# Patient Record
Sex: Female | Born: 1940
Health system: Southern US, Community
[De-identification: ages and names within clinical notes are randomized; demographics above are authoritative.]

## PROBLEM LIST (undated history)

## (undated) DIAGNOSIS — Z9289 Personal history of other medical treatment: Secondary | ICD-10-CM

## (undated) DIAGNOSIS — G4730 Sleep apnea, unspecified: Secondary | ICD-10-CM

## (undated) DIAGNOSIS — R519 Headache, unspecified: Secondary | ICD-10-CM

## (undated) DIAGNOSIS — K219 Gastro-esophageal reflux disease without esophagitis: Secondary | ICD-10-CM

## (undated) DIAGNOSIS — R0600 Dyspnea, unspecified: Secondary | ICD-10-CM

## (undated) DIAGNOSIS — I10 Essential (primary) hypertension: Secondary | ICD-10-CM

## (undated) DIAGNOSIS — G9332 Myalgic encephalomyelitis/chronic fatigue syndrome: Secondary | ICD-10-CM

## (undated) DIAGNOSIS — J189 Pneumonia, unspecified organism: Secondary | ICD-10-CM

## (undated) DIAGNOSIS — K5790 Diverticulosis of intestine, part unspecified, without perforation or abscess without bleeding: Secondary | ICD-10-CM

## (undated) DIAGNOSIS — B029 Zoster without complications: Secondary | ICD-10-CM

## (undated) DIAGNOSIS — J45909 Unspecified asthma, uncomplicated: Secondary | ICD-10-CM

## (undated) DIAGNOSIS — M1990 Unspecified osteoarthritis, unspecified site: Secondary | ICD-10-CM

## (undated) DIAGNOSIS — S82409A Unspecified fracture of shaft of unspecified fibula, initial encounter for closed fracture: Secondary | ICD-10-CM

## (undated) DIAGNOSIS — J449 Chronic obstructive pulmonary disease, unspecified: Secondary | ICD-10-CM

## (undated) DIAGNOSIS — R5382 Chronic fatigue, unspecified: Secondary | ICD-10-CM

## (undated) DIAGNOSIS — T7840XA Allergy, unspecified, initial encounter: Secondary | ICD-10-CM

## (undated) DIAGNOSIS — G709 Myoneural disorder, unspecified: Secondary | ICD-10-CM

## (undated) DIAGNOSIS — D649 Anemia, unspecified: Secondary | ICD-10-CM

## (undated) DIAGNOSIS — I890 Lymphedema, not elsewhere classified: Secondary | ICD-10-CM

## (undated) DIAGNOSIS — M109 Gout, unspecified: Secondary | ICD-10-CM

## (undated) HISTORY — DX: Myoneural disorder, unspecified: G70.9

## (undated) HISTORY — DX: Gastro-esophageal reflux disease without esophagitis: K21.9

## (undated) HISTORY — DX: Unspecified osteoarthritis, unspecified site: M19.90

## (undated) HISTORY — DX: Unspecified fracture of shaft of unspecified fibula, initial encounter for closed fracture: S82.409A

## (undated) HISTORY — DX: Allergy, unspecified, initial encounter: T78.40XA

## (undated) HISTORY — DX: Unspecified asthma, uncomplicated: J45.909

## (undated) HISTORY — DX: Essential (primary) hypertension: I10

## (undated) HISTORY — DX: Chronic fatigue, unspecified: R53.82

## (undated) HISTORY — DX: Myalgic encephalomyelitis/chronic fatigue syndrome: G93.32

## (undated) HISTORY — PX: BIOPSY THYROID: PRO38

## (undated) HISTORY — DX: Lymphedema, not elsewhere classified: I89.0

---

## 1972-05-20 HISTORY — PX: ABDOMINAL HYSTERECTOMY: SHX81

## 2002-05-20 DIAGNOSIS — I890 Lymphedema, not elsewhere classified: Secondary | ICD-10-CM

## 2002-05-20 HISTORY — DX: Lymphedema, not elsewhere classified: I89.0

## 2003-05-21 HISTORY — PX: CHOLECYSTECTOMY: SHX55

## 2004-07-14 ENCOUNTER — Emergency Department: Payer: Self-pay | Admitting: General Practice

## 2004-07-16 ENCOUNTER — Ambulatory Visit: Payer: Self-pay | Admitting: Unknown Physician Specialty

## 2004-07-16 ENCOUNTER — Other Ambulatory Visit: Payer: Self-pay

## 2004-07-16 ENCOUNTER — Inpatient Hospital Stay: Payer: Self-pay | Admitting: Surgery

## 2004-09-24 ENCOUNTER — Ambulatory Visit: Payer: Self-pay | Admitting: Gastroenterology

## 2006-05-21 ENCOUNTER — Ambulatory Visit: Payer: Self-pay | Admitting: Family Medicine

## 2008-03-09 ENCOUNTER — Ambulatory Visit: Payer: Self-pay | Admitting: Vascular Surgery

## 2008-11-17 ENCOUNTER — Ambulatory Visit: Payer: Self-pay | Admitting: Family Medicine

## 2011-12-03 ENCOUNTER — Ambulatory Visit: Payer: Self-pay

## 2012-01-15 DIAGNOSIS — L989 Disorder of the skin and subcutaneous tissue, unspecified: Secondary | ICD-10-CM | POA: Diagnosis not present

## 2012-01-15 DIAGNOSIS — I10 Essential (primary) hypertension: Secondary | ICD-10-CM | POA: Diagnosis not present

## 2012-01-15 DIAGNOSIS — J069 Acute upper respiratory infection, unspecified: Secondary | ICD-10-CM | POA: Diagnosis not present

## 2012-01-21 DIAGNOSIS — L989 Disorder of the skin and subcutaneous tissue, unspecified: Secondary | ICD-10-CM | POA: Diagnosis not present

## 2012-03-11 DIAGNOSIS — K140 Glossitis: Secondary | ICD-10-CM | POA: Diagnosis not present

## 2012-03-26 DIAGNOSIS — I890 Lymphedema, not elsewhere classified: Secondary | ICD-10-CM | POA: Diagnosis not present

## 2012-03-26 DIAGNOSIS — J029 Acute pharyngitis, unspecified: Secondary | ICD-10-CM | POA: Diagnosis not present

## 2012-03-26 DIAGNOSIS — J069 Acute upper respiratory infection, unspecified: Secondary | ICD-10-CM | POA: Diagnosis not present

## 2012-10-16 DIAGNOSIS — N898 Other specified noninflammatory disorders of vagina: Secondary | ICD-10-CM | POA: Diagnosis not present

## 2012-11-17 DIAGNOSIS — Z0000 Encounter for general adult medical examination without abnormal findings: Secondary | ICD-10-CM | POA: Diagnosis not present

## 2013-01-20 DIAGNOSIS — J45901 Unspecified asthma with (acute) exacerbation: Secondary | ICD-10-CM | POA: Diagnosis not present

## 2013-01-20 DIAGNOSIS — J019 Acute sinusitis, unspecified: Secondary | ICD-10-CM | POA: Diagnosis not present

## 2013-01-20 DIAGNOSIS — J209 Acute bronchitis, unspecified: Secondary | ICD-10-CM | POA: Diagnosis not present

## 2013-03-03 DIAGNOSIS — J45909 Unspecified asthma, uncomplicated: Secondary | ICD-10-CM | POA: Diagnosis not present

## 2013-03-03 DIAGNOSIS — M1990 Unspecified osteoarthritis, unspecified site: Secondary | ICD-10-CM | POA: Diagnosis not present

## 2013-03-03 DIAGNOSIS — I10 Essential (primary) hypertension: Secondary | ICD-10-CM | POA: Diagnosis not present

## 2013-03-03 DIAGNOSIS — E669 Obesity, unspecified: Secondary | ICD-10-CM | POA: Diagnosis not present

## 2013-05-19 ENCOUNTER — Ambulatory Visit: Payer: Self-pay

## 2013-08-31 DIAGNOSIS — I10 Essential (primary) hypertension: Secondary | ICD-10-CM | POA: Diagnosis not present

## 2013-08-31 DIAGNOSIS — Z23 Encounter for immunization: Secondary | ICD-10-CM | POA: Diagnosis not present

## 2013-08-31 DIAGNOSIS — L259 Unspecified contact dermatitis, unspecified cause: Secondary | ICD-10-CM | POA: Diagnosis not present

## 2013-08-31 DIAGNOSIS — J45909 Unspecified asthma, uncomplicated: Secondary | ICD-10-CM | POA: Diagnosis not present

## 2013-11-18 DIAGNOSIS — M1710 Unilateral primary osteoarthritis, unspecified knee: Secondary | ICD-10-CM | POA: Diagnosis not present

## 2013-11-18 DIAGNOSIS — IMO0002 Reserved for concepts with insufficient information to code with codable children: Secondary | ICD-10-CM | POA: Diagnosis not present

## 2013-12-21 DIAGNOSIS — I8310 Varicose veins of unspecified lower extremity with inflammation: Secondary | ICD-10-CM | POA: Diagnosis not present

## 2013-12-21 DIAGNOSIS — I10 Essential (primary) hypertension: Secondary | ICD-10-CM | POA: Diagnosis not present

## 2013-12-21 DIAGNOSIS — I890 Lymphedema, not elsewhere classified: Secondary | ICD-10-CM | POA: Diagnosis not present

## 2014-02-16 DIAGNOSIS — L02419 Cutaneous abscess of limb, unspecified: Secondary | ICD-10-CM | POA: Diagnosis not present

## 2014-02-16 DIAGNOSIS — Z23 Encounter for immunization: Secondary | ICD-10-CM | POA: Diagnosis not present

## 2014-02-16 DIAGNOSIS — I890 Lymphedema, not elsewhere classified: Secondary | ICD-10-CM | POA: Diagnosis not present

## 2014-04-06 DIAGNOSIS — L0390 Cellulitis, unspecified: Secondary | ICD-10-CM | POA: Diagnosis not present

## 2014-04-06 DIAGNOSIS — I83002 Varicose veins of unspecified lower extremity with ulcer of calf: Secondary | ICD-10-CM | POA: Diagnosis not present

## 2014-04-07 ENCOUNTER — Encounter: Payer: Self-pay | Admitting: Surgery

## 2014-04-07 DIAGNOSIS — I87311 Chronic venous hypertension (idiopathic) with ulcer of right lower extremity: Secondary | ICD-10-CM | POA: Diagnosis not present

## 2014-04-13 DIAGNOSIS — I87311 Chronic venous hypertension (idiopathic) with ulcer of right lower extremity: Secondary | ICD-10-CM | POA: Diagnosis not present

## 2014-04-19 ENCOUNTER — Encounter: Payer: Self-pay | Admitting: Surgery

## 2014-04-19 DIAGNOSIS — I87311 Chronic venous hypertension (idiopathic) with ulcer of right lower extremity: Secondary | ICD-10-CM | POA: Diagnosis not present

## 2014-04-19 DIAGNOSIS — L03113 Cellulitis of right upper limb: Secondary | ICD-10-CM | POA: Diagnosis not present

## 2014-04-21 DIAGNOSIS — I87311 Chronic venous hypertension (idiopathic) with ulcer of right lower extremity: Secondary | ICD-10-CM | POA: Diagnosis not present

## 2014-04-21 DIAGNOSIS — L03113 Cellulitis of right upper limb: Secondary | ICD-10-CM | POA: Diagnosis not present

## 2014-04-28 DIAGNOSIS — I87311 Chronic venous hypertension (idiopathic) with ulcer of right lower extremity: Secondary | ICD-10-CM | POA: Diagnosis not present

## 2014-04-28 DIAGNOSIS — L03113 Cellulitis of right upper limb: Secondary | ICD-10-CM | POA: Diagnosis not present

## 2014-05-05 DIAGNOSIS — L03113 Cellulitis of right upper limb: Secondary | ICD-10-CM | POA: Diagnosis not present

## 2014-05-05 DIAGNOSIS — I87311 Chronic venous hypertension (idiopathic) with ulcer of right lower extremity: Secondary | ICD-10-CM | POA: Diagnosis not present

## 2014-05-12 DIAGNOSIS — I87311 Chronic venous hypertension (idiopathic) with ulcer of right lower extremity: Secondary | ICD-10-CM | POA: Diagnosis not present

## 2014-05-12 DIAGNOSIS — L03113 Cellulitis of right upper limb: Secondary | ICD-10-CM | POA: Diagnosis not present

## 2014-05-19 DIAGNOSIS — I87311 Chronic venous hypertension (idiopathic) with ulcer of right lower extremity: Secondary | ICD-10-CM | POA: Diagnosis not present

## 2014-05-19 DIAGNOSIS — I10 Essential (primary) hypertension: Secondary | ICD-10-CM | POA: Diagnosis not present

## 2014-05-19 DIAGNOSIS — M7989 Other specified soft tissue disorders: Secondary | ICD-10-CM | POA: Diagnosis not present

## 2014-05-19 DIAGNOSIS — M79609 Pain in unspecified limb: Secondary | ICD-10-CM | POA: Diagnosis not present

## 2014-05-19 DIAGNOSIS — L97309 Non-pressure chronic ulcer of unspecified ankle with unspecified severity: Secondary | ICD-10-CM | POA: Diagnosis not present

## 2014-05-19 DIAGNOSIS — L03113 Cellulitis of right upper limb: Secondary | ICD-10-CM | POA: Diagnosis not present

## 2014-05-19 DIAGNOSIS — I890 Lymphedema, not elsewhere classified: Secondary | ICD-10-CM | POA: Diagnosis not present

## 2014-05-20 ENCOUNTER — Encounter: Payer: Self-pay | Admitting: Surgery

## 2014-05-20 DIAGNOSIS — I890 Lymphedema, not elsewhere classified: Secondary | ICD-10-CM | POA: Diagnosis not present

## 2014-05-20 DIAGNOSIS — L03113 Cellulitis of right upper limb: Secondary | ICD-10-CM | POA: Diagnosis not present

## 2014-05-20 DIAGNOSIS — I87311 Chronic venous hypertension (idiopathic) with ulcer of right lower extremity: Secondary | ICD-10-CM | POA: Diagnosis not present

## 2014-05-23 DIAGNOSIS — I890 Lymphedema, not elsewhere classified: Secondary | ICD-10-CM | POA: Diagnosis not present

## 2014-05-23 DIAGNOSIS — I87311 Chronic venous hypertension (idiopathic) with ulcer of right lower extremity: Secondary | ICD-10-CM | POA: Diagnosis not present

## 2014-05-23 DIAGNOSIS — L03113 Cellulitis of right upper limb: Secondary | ICD-10-CM | POA: Diagnosis not present

## 2014-05-26 DIAGNOSIS — I890 Lymphedema, not elsewhere classified: Secondary | ICD-10-CM | POA: Diagnosis not present

## 2014-05-26 DIAGNOSIS — I87311 Chronic venous hypertension (idiopathic) with ulcer of right lower extremity: Secondary | ICD-10-CM | POA: Diagnosis not present

## 2014-05-26 DIAGNOSIS — L03113 Cellulitis of right upper limb: Secondary | ICD-10-CM | POA: Diagnosis not present

## 2014-06-02 ENCOUNTER — Encounter: Payer: Self-pay | Admitting: General Surgery

## 2014-06-02 DIAGNOSIS — I87311 Chronic venous hypertension (idiopathic) with ulcer of right lower extremity: Secondary | ICD-10-CM | POA: Diagnosis not present

## 2014-06-02 DIAGNOSIS — I890 Lymphedema, not elsewhere classified: Secondary | ICD-10-CM | POA: Diagnosis not present

## 2014-06-02 DIAGNOSIS — L03113 Cellulitis of right upper limb: Secondary | ICD-10-CM | POA: Diagnosis not present

## 2014-06-06 ENCOUNTER — Encounter: Payer: Self-pay | Admitting: Surgery

## 2014-06-06 DIAGNOSIS — I87311 Chronic venous hypertension (idiopathic) with ulcer of right lower extremity: Secondary | ICD-10-CM | POA: Diagnosis not present

## 2014-06-06 DIAGNOSIS — I890 Lymphedema, not elsewhere classified: Secondary | ICD-10-CM | POA: Diagnosis not present

## 2014-06-06 DIAGNOSIS — L03113 Cellulitis of right upper limb: Secondary | ICD-10-CM | POA: Diagnosis not present

## 2014-06-09 DIAGNOSIS — I87311 Chronic venous hypertension (idiopathic) with ulcer of right lower extremity: Secondary | ICD-10-CM | POA: Diagnosis not present

## 2014-06-09 DIAGNOSIS — L03113 Cellulitis of right upper limb: Secondary | ICD-10-CM | POA: Diagnosis not present

## 2014-06-09 DIAGNOSIS — I890 Lymphedema, not elsewhere classified: Secondary | ICD-10-CM | POA: Diagnosis not present

## 2014-06-13 DIAGNOSIS — I87311 Chronic venous hypertension (idiopathic) with ulcer of right lower extremity: Secondary | ICD-10-CM | POA: Diagnosis not present

## 2014-06-13 DIAGNOSIS — I890 Lymphedema, not elsewhere classified: Secondary | ICD-10-CM | POA: Diagnosis not present

## 2014-06-13 DIAGNOSIS — L03113 Cellulitis of right upper limb: Secondary | ICD-10-CM | POA: Diagnosis not present

## 2014-06-16 DIAGNOSIS — I890 Lymphedema, not elsewhere classified: Secondary | ICD-10-CM | POA: Diagnosis not present

## 2014-06-16 DIAGNOSIS — I87311 Chronic venous hypertension (idiopathic) with ulcer of right lower extremity: Secondary | ICD-10-CM | POA: Diagnosis not present

## 2014-06-16 DIAGNOSIS — L03113 Cellulitis of right upper limb: Secondary | ICD-10-CM | POA: Diagnosis not present

## 2014-06-20 ENCOUNTER — Encounter: Payer: Self-pay | Admitting: Surgery

## 2014-06-20 ENCOUNTER — Encounter: Payer: Self-pay | Admitting: General Surgery

## 2014-06-20 DIAGNOSIS — I890 Lymphedema, not elsewhere classified: Secondary | ICD-10-CM | POA: Diagnosis not present

## 2014-06-20 DIAGNOSIS — I87311 Chronic venous hypertension (idiopathic) with ulcer of right lower extremity: Secondary | ICD-10-CM | POA: Diagnosis not present

## 2014-06-20 DIAGNOSIS — L03113 Cellulitis of right upper limb: Secondary | ICD-10-CM | POA: Diagnosis not present

## 2014-06-23 DIAGNOSIS — L03113 Cellulitis of right upper limb: Secondary | ICD-10-CM | POA: Diagnosis not present

## 2014-06-23 DIAGNOSIS — I890 Lymphedema, not elsewhere classified: Secondary | ICD-10-CM | POA: Diagnosis not present

## 2014-06-23 DIAGNOSIS — I87311 Chronic venous hypertension (idiopathic) with ulcer of right lower extremity: Secondary | ICD-10-CM | POA: Diagnosis not present

## 2014-06-24 DIAGNOSIS — R6889 Other general symptoms and signs: Secondary | ICD-10-CM | POA: Diagnosis not present

## 2014-06-24 DIAGNOSIS — I10 Essential (primary) hypertension: Secondary | ICD-10-CM | POA: Diagnosis not present

## 2014-06-27 DIAGNOSIS — I87311 Chronic venous hypertension (idiopathic) with ulcer of right lower extremity: Secondary | ICD-10-CM | POA: Diagnosis not present

## 2014-06-27 DIAGNOSIS — L03113 Cellulitis of right upper limb: Secondary | ICD-10-CM | POA: Diagnosis not present

## 2014-06-27 DIAGNOSIS — I890 Lymphedema, not elsewhere classified: Secondary | ICD-10-CM | POA: Diagnosis not present

## 2014-07-26 DIAGNOSIS — I10 Essential (primary) hypertension: Secondary | ICD-10-CM | POA: Diagnosis not present

## 2014-07-26 DIAGNOSIS — K219 Gastro-esophageal reflux disease without esophagitis: Secondary | ICD-10-CM | POA: Diagnosis not present

## 2014-10-31 ENCOUNTER — Other Ambulatory Visit: Payer: Self-pay | Admitting: Unknown Physician Specialty

## 2014-10-31 ENCOUNTER — Ambulatory Visit (INDEPENDENT_AMBULATORY_CARE_PROVIDER_SITE_OTHER): Payer: BC Managed Care – PPO | Admitting: Unknown Physician Specialty

## 2014-10-31 ENCOUNTER — Telehealth: Payer: Self-pay | Admitting: Unknown Physician Specialty

## 2014-10-31 ENCOUNTER — Encounter: Payer: Self-pay | Admitting: Unknown Physician Specialty

## 2014-10-31 ENCOUNTER — Telehealth: Payer: Self-pay

## 2014-10-31 VITALS — BP 139/83 | HR 67 | Temp 97.8°F | Ht 63.0 in | Wt 350.4 lb

## 2014-10-31 DIAGNOSIS — M5431 Sciatica, right side: Secondary | ICD-10-CM

## 2014-10-31 DIAGNOSIS — I890 Lymphedema, not elsewhere classified: Secondary | ICD-10-CM | POA: Diagnosis not present

## 2014-10-31 MED ORDER — FUROSEMIDE 40 MG PO TABS: 40.0000 mg | ORAL_TABLET | Freq: Every day | ORAL | Status: DC

## 2014-10-31 MED ORDER — CLINDAMYCIN HCL 150 MG PO CAPS: 150.0000 mg | ORAL_CAPSULE | Freq: Four times a day (QID) | ORAL | Status: DC

## 2014-10-31 MED ORDER — METHYLPREDNISOLONE 4 MG PO TBPK
ORAL_TABLET | ORAL | Status: DC
Start: 2014-10-31 — End: 2015-01-27

## 2014-10-31 MED ORDER — METHYLPREDNISOLONE 4 MG PO TBPK: ORAL_TABLET | ORAL | Status: DC

## 2014-10-31 NOTE — Telephone Encounter (Signed)
Spoke with pharmacy and with [name]'s assistance, resent the rx to the pharmacy.

## 2014-10-31 NOTE — Telephone Encounter (Signed)
Pt called states [facility] was supposed to send an RX for prednisone to the pt's pharmacy. Pt stated the pharmacy did not have this medication on file for her wants to know if RX can be sent to the pharmacy. Pharm is [facility] in [place]. Thanks

## 2014-10-31 NOTE — Patient Instructions (Signed)
Piriformis Syndrome °with Rehab °Piriformis syndrome is a condition the affects the nervous system in the area of the hip, and is characterized by pain and possibly a loss of feeling in the backside (posterior) thigh that may extend down the entire length of the leg. The symptoms are caused by an increase in pressure on the sciatic nerve by the piriformis muscle, which is on the back of the hip and is responsible for externally rotating the hip. The sciatic nerve and its branches connect to much of the leg. Normally the sciatic nerve runs between the piriformis muscle and other muscles. However, in certain individuals the nerve runs through the muscle, which causes an increase in pressure on the nerve and results in the symptoms of piriformis syndrome. °SYMPTOMS  °· Pain, tingling, numbness, or burning in the back of the thigh that may also extend down the entire leg. °· Occasionally, tenderness in the buttock. °· Loss of function of the leg. °· Pain that worsens when using the piriformis muscle (running, jumping, or stairs). °· Pain that increases with prolonged sitting. °· Pain that is lessened by lying flat on the back. °CAUSES  °· Piriformis syndrome is the result of an increase in pressure placed on the sciatic nerve. Oftentimes, piriformis syndrome is an overuse injury. °· Stress placed on the nerve from a sudden increase in the intensity, frequency, or duration of training. °· Compensation of other extremity injuries. °RISK INCREASES WITH: °· Sports that involve the piriformis muscle (running, walking, or jumping). °· You are born with (congenital) a defect in which the sciatic nerve passes through the muscle. °PREVENTION °· Warm up and stretch properly before activity. °· Allow for adequate recovery between workouts. °· Maintain physical fitness: °· Strength, flexibility, and endurance. °· Cardiovascular fitness. °PROGNOSIS  °If treated properly, the symptoms of piriformis syndrome usually resolve in 2 to 6  weeks. °RELATED COMPLICATIONS  °· Persistent and possibly permanent pain and numbness in the lower extremity. °· Weakness of the extremity that may progress to disability and inability to compete. °TREATMENT  °The most effective treatment for piriformis syndrome is rest from any activities that aggravate the symptoms. Ice and pain medication may help reduce pain and inflammation. The use of strengthening and stretching exercises may help reduce pain with activity. These exercises may be performed at home or with a therapist. A referral to a therapist may be given for further evaluation and treatment, such as ultrasound. Corticosteroid injections may be given to reduce inflammation that is causing pressure to be placed on the sciatic nerve. If nonsurgical (conservative) treatment is unsuccessful, then surgery may be recommended.  °MEDICATION  °· If pain medication is necessary, then nonsteroidal anti-inflammatory medications, such as aspirin and ibuprofen, or other minor pain relievers, such as acetaminophen, are often recommended. °· Do not take pain medication for 7 days before surgery. °· Prescription pain relievers may be given if deemed necessary by your caregiver. Use only as directed and only as much as you need. °· Corticosteroid injections may be given by your caregiver. These injections should be reserved for the most serious cases, because they may only be given a certain number of times. °HEAT AND COLD:  °· Cold treatment (icing) relieves pain and reduces inflammation. Cold treatment should be applied for 10 to 15 minutes every 2 to 3 hours for inflammation and pain and immediately after any activity that aggravates your symptoms. Use ice packs or massage the area with a piece of ice (ice massage). °· Heat   treatment may be used prior to performing the stretching and strengthening activities prescribed by your caregiver, physical therapist, or athletic trainer. Use a heat pack or soak the injury in warm  water. °SEEK IMMEDIATE MEDICAL CARE IF: °· Treatment seems to offer no benefit, or the condition worsens. °· Any medications produce adverse side effects. °EXERCISES °RANGE OF MOTION (ROM) AND STRETCHING EXERCISES - Piriformis Syndrome °These exercises may help you when beginning to rehabilitate your injury. Your symptoms may resolve with or without further involvement from your physician, physical therapist, or athletic trainer. While completing these exercises, remember:  °· Restoring tissue flexibility helps normal motion to return to the joints. This allows healthier, less painful movement and activity. °· An effective stretch should be held for at least 30 seconds. °· A stretch should never be painful. You should only feel a gentle lengthening or release in the stretched tissue. °STRETCH - Hip Rotators °· Lie on your back on a firm surface. Grasp your right / left knee with your right / left hand and your ankle with your opposite hand. °· Keeping your hips and shoulders firmly planted, gently pull your right / left knee and rotate your lower leg toward your opposite shoulder until you feel a stretch in your buttocks. °· Hold this stretch for __________ seconds. °Repeat this stretch __________ times. Complete this stretch __________ times per day. °STRETCH - Iliotibial Band °· On the floor or bed, lie on your side so your right / left leg is on top. Bend your knee and grab your ankle. °· Slowly bring your knee back so that your thigh is in line with your trunk. Keep your heel at your buttocks and gently arch your back so your head, shoulders, and hips line up. °· Slowly lower your leg so that your knee approaches the floor/bed until you feel a gentle stretch on the outside of your right / left thigh. If you do not feel a stretch and your knee will not fall farther, place the heel of your opposite foot on top of your knee and pull your thigh down farther. °· Hold this stretch for __________ seconds. °Repeat  __________ times. Complete __________ times per day. °STRENGTHENING EXERCISES - Piriformis Syndrome  °These are some of the caregiver again or until your symptoms are resolved. Remember:  °· Strong muscles with good endurance tolerate stress better. °· Do the exercises as initially prescribed by your caregiver. Progress slowly with each exercise, gradually increasing the number of repetitions and weight used under their guidance. °STRENGTH - Hip Abductors, Straight Leg Raises °Be aware of your form throughout the entire exercise so that you exercise the correct muscles. Sloppy form means that you are not strengthening the correct muscles. °· Lie on your side so that your head, shoulders, knee, and hip line up. You may bend your lower knee to help maintain your balance. Your right / left leg should be on top. °· Roll your hips slightly forward, so that your hips are stacked directly over each other and your right / left knee is facing forward. °· Lift your top leg up 4-6 inches, leading with your heel. Be sure that your foot does not drift forward or that your knee does not roll toward the ceiling. °· Hold this position for __________ seconds. You should feel the muscles in your outer hip lifting (you may not notice this until your leg begins to tire). °· Slowly lower your leg to the starting position. Allow the muscles to fully   relax before beginning the next repetition. °Repeat __________ times. Complete this exercise __________ times per day.  °STRENGTH - Hip Abductors, Quadruped °· On a firm, lightly padded surface, position yourself on your hands and knees. Your hands should be directly below your shoulders and your knees should be directly below your hips. °· Keeping your right / left knee bent, lift your leg out to the side. Keep your legs level and in line with your shoulders. °· Position yourself on your hands and knees. °· Hold for __________ seconds. °· Keeping your trunk steady and your hips level, slowly  lower your leg to the starting position. °Repeat __________ times. Complete this exercise __________ times per day.  °STRENGTH - Hip Abductors, Standing °· Tie one end of a rubber exercise band/tubing to a secure surface (table, pole) and tie a loop at the other end. °· Place the loop around your right / left ankle. Keeping your ankle with the band directly opposite of the secured end, step away until there is tension in the tube/band. °· Hold onto a chair as needed for balance. °· Keeping your back upright, your shoulders over your hips, and your toes pointing forward, lift your right / left leg out to your side. Be sure to lift your leg with your hip muscles. Do not "throw" your leg or tip your body to lift your leg. °· Slowly and with control, return to the starting position. °Repeat exercise __________ times. Complete this exercise __________ times per day.  °Document Released: 05/06/2005 Document Revised: 09/20/2013 Document Reviewed: 08/18/2008 °[employer]® Patient Information ©2015 [employer]. This information is not intended to replace advice given to you by your health care provider. Make sure you discuss any questions you have with your health care provider. ° °Sciatica °Sciatica is pain, weakness, numbness, or tingling along the path of the sciatic nerve. The nerve starts in the lower back and runs down the back of each leg. The nerve controls the muscles in the lower leg and in the back of the knee, while also providing sensation to the back of the thigh, lower leg, and the sole of your foot. Sciatica is a symptom of another medical condition. For instance, nerve damage or certain conditions, such as a herniated disk or bone spur on the spine, pinch or put pressure on the sciatic nerve. This causes the pain, weakness, or other sensations normally associated with sciatica. Generally, sciatica only affects one side of the body. °CAUSES  °· Herniated or slipped disc. °· Degenerative disk disease. °· A pain  disorder involving the narrow muscle in the buttocks (piriformis syndrome). °· Pelvic injury or fracture. °· Pregnancy. °· Tumor (rare). °SYMPTOMS  °Symptoms can vary from mild to very severe. The symptoms usually travel from the low back to the buttocks and down the back of the leg. Symptoms can include: °· Mild tingling or dull aches in the lower back, leg, or hip. °· Numbness in the back of the calf or sole of the foot. °· Burning sensations in the lower back, leg, or hip. °· Sharp pains in the lower back, leg, or hip. °· Leg weakness. °· Severe back pain inhibiting movement. °These symptoms may get worse with coughing, sneezing, laughing, or prolonged sitting or standing. Also, being overweight may worsen symptoms. °DIAGNOSIS  °Your caregiver will perform a physical exam to look for common symptoms of sciatica. He or she may ask you to do certain movements or activities that would trigger sciatic nerve pain. Other tests may   be performed to find the cause of the sciatica. These may include: °· Blood tests. °· X-rays. °· Imaging tests, such as an MRI or CT scan. °TREATMENT  °Treatment is directed at the cause of the sciatic pain. Sometimes, treatment is not necessary and the pain and discomfort goes away on its own. If treatment is needed, your caregiver may suggest: °· Over-the-counter medicines to relieve pain. °· Prescription medicines, such as anti-inflammatory medicine, muscle relaxants, or narcotics. °· Applying heat or ice to the painful area. °· Steroid injections to lessen pain, irritation, and inflammation around the nerve. °· Reducing activity during periods of pain. °· Exercising and stretching to strengthen your abdomen and improve flexibility of your spine. Your caregiver may suggest losing weight if the extra weight makes the back pain worse. °· Physical therapy. °· Surgery to eliminate what is pressing or pinching the nerve, such as a bone spur or part of a herniated disk. °HOME CARE INSTRUCTIONS    °· Only take over-the-counter or prescription medicines for pain or discomfort as directed by your caregiver. °· Apply ice to the affected area for 20 minutes, 3-4 times a day for the first 48-72 hours. Then try heat in the same way. °· Exercise, stretch, or perform your usual activities if these do not aggravate your pain. °· Attend physical therapy sessions as directed by your caregiver. °· Keep all follow-up appointments as directed by your caregiver. °· Do not wear high heels or shoes that do not provide proper support. °· Check your mattress to see if it is too soft. A firm mattress may lessen your pain and discomfort. °SEEK IMMEDIATE MEDICAL CARE IF:  °· You lose control of your bowel or bladder (incontinence). °· You have increasing weakness in the lower back, pelvis, buttocks, or legs. °· You have redness or swelling of your back. °· You have a burning sensation when you urinate. °· You have pain that gets worse when you lie down or awakens you at night. °· Your pain is worse than you have experienced in the past. °· Your pain is lasting longer than 4 weeks. °· You are suddenly losing weight without reason. °MAKE SURE YOU: °· Understand these instructions. °· Will watch your condition. °· Will get help right away if you are not doing well or get worse. °Document Released: 04/30/2001 Document Revised: 11/05/2011 Document Reviewed: 09/15/2011 °ExitCare® Patient Information ©2015 [employer]. This information is not intended to replace advice given to you by your health care provider. Make sure you discuss any questions you have with your health care provider. ° °

## 2014-10-31 NOTE — Progress Notes (Signed)
BP 139/83 mmHg  Pulse 67  Temp(Src) 97.8 F (36.6 C)  Ht  (1.6 m)  Wt 350 lb 6.4 oz (158.94 kg)  BMI 62.09 kg/m2  SpO2 95%   Subjective:    Patient ID: [name], female    DOB: Sep 15, 1940, 74 y.o.   MRN: [id number]  HPI: [name] is a 74 y.o. female  Chief Complaint  Patient presents with  . Back Pain    pt states pain starts in buttocks area and runs through the right thigh and stops just before the knee    Relevant past medical, surgical, family and social history reviewed and updated as indicated. Interim medical history since our last visit reviewed. Allergies and medications reviewed and updated.  Back Pain This is a new problem. The current episode started 1 to 4 weeks ago. The problem occurs constantly (while sitting). The problem is unchanged. Pain location: buttock. The quality of the pain is described as aching. The pain radiates to the right knee. The pain is severe. The pain is worse during the day (while sitting). The symptoms are aggravated by sitting. Associated symptoms include leg pain. Pertinent negatives include no bladder incontinence, bowel incontinence or weakness. Risk factors include obesity and sedentary lifestyle. She has tried analgesics (Tylenol) for the symptoms. The treatment provided mild relief.  Got a massage and told that her buttock muscle was tight  Leg pain: Chronic Lymphadenopathy for right leg.  Would like to get a refill of Clindamyacin for prn use.  Needs refill of Lasix.    Review of Systems  Gastrointestinal: Negative for bowel incontinence.  Genitourinary: Negative for bladder incontinence.  Musculoskeletal: Positive for back pain.  Neurological: Negative for weakness.    Per HPI unless specifically indicated above     Objective:    BP 139/83 mmHg  Pulse 67  Temp(Src) 97.8 F (36.6 C)  Ht  (1.6 m)  Wt 350 lb 6.4 oz (158.94 kg)  BMI 62.09 kg/m2  SpO2 95%  Wt Readings from Last 3 Encounters:  10/31/14 350 lb  6.4 oz (158.94 kg)  07/26/14 348 lb (157.852 kg)    Physical Exam  Constitutional: She is oriented to person, place, and time. She appears well-developed and well-nourished. No distress.  HENT:  Head: Normocephalic and atraumatic.  Eyes: Conjunctivae and lids are normal. Right eye exhibits no discharge. Left eye exhibits no discharge. No scleral icterus.  Cardiovascular: Normal rate and regular rhythm.   Pulmonary/Chest: Effort normal. No respiratory distress.  Abdominal: Normal appearance. There is no splenomegaly or hepatomegaly.  Musculoskeletal: Normal range of motion.  Neurological: She is alert and oriented to person, place, and time.  Skin: Skin is intact. No rash noted. No pallor.  Psychiatric: She has a normal mood and affect. Her behavior is normal. Judgment and thought content normal.       Unable to assess back and unable to move to different positions.  She does have a positive SLR  No results found for this or any previous visit.    Assessment & Plan:   Problem List Items Addressed This Visit    None    Visit Diagnoses    Sciatica, right    -  Primary    Start Medrol dose pack.  May need to refer to physiatry.  Refusing PT for now    Lymphedema        Pt with frequent cellulitis due to this condition,  Rx for Clindamyacin for prn use.  Refill Furosemide        Follow up plan: No Follow-up on file.

## 2014-10-31 NOTE — Telephone Encounter (Signed)
It is the Medrol dose pack,  It's on the record as it was prescribed.  Please contact the pharmacy

## 2014-10-31 NOTE — Telephone Encounter (Signed)
error 

## 2014-12-05 ENCOUNTER — Ambulatory Visit (INDEPENDENT_AMBULATORY_CARE_PROVIDER_SITE_OTHER): Payer: BC Managed Care – PPO | Admitting: Unknown Physician Specialty

## 2014-12-05 ENCOUNTER — Encounter: Payer: Self-pay | Admitting: Unknown Physician Specialty

## 2014-12-05 VITALS — BP 169/91 | HR 81 | Temp 98.4°F | Ht 63.1 in | Wt 342.2 lb

## 2014-12-05 DIAGNOSIS — I890 Lymphedema, not elsewhere classified: Secondary | ICD-10-CM | POA: Diagnosis not present

## 2014-12-05 DIAGNOSIS — M5431 Sciatica, right side: Secondary | ICD-10-CM

## 2014-12-05 DIAGNOSIS — I10 Essential (primary) hypertension: Secondary | ICD-10-CM | POA: Diagnosis not present

## 2014-12-05 MED ORDER — TRIAMCINOLONE ACETONIDE 0.1 % EX CREA: 1.0000 "application " | TOPICAL_CREAM | Freq: Two times a day (BID) | CUTANEOUS | Status: DC

## 2014-12-05 NOTE — Patient Instructions (Addendum)
f you have chronic pain and are looking for alternatives to medication and surgery, you have a lot of options.   However, not all alternative pain treatments work. Some can even be risky. Some alternative treatments may help with pain from bad backs, osteoarthritis, and headaches, but have no effect on chronic pain from fibromyalgia or diabetic nerve damage.  Here's a rundown of the most commonly used alternative treatments for chronic pain.  Acupuncture. Once seen as bizarre, acupuncture is rapidly becoming a mainstream treatment for pain. Studies have found that it works for pain caused by many conditions, including fibromyalgia, osteoarthritis, back injuries, and sports injuries. How does it work? No one's quite sure. It could release pain-numbing chemicals in the body. Or it might block the pain signals coming from the nerves.  Exercise. Motion is lotion Going for a walk or swim isn't a treatment, exactly. But regular physical activity has big benefits for people with many different painful conditions. Study after study has found that physical activity can help relieve chronic pain, as well as boost energy and mood.   This is particularly true of pain related to arthritis .    Chiropractic manipulation. Although mainstream medicine has traditionally regarded spinal manipulation with suspicion, it's becoming a more accepted treatment.  I think many people respond will th chiropractic treatment.    Supplements and vitamins. There is evidence that certain dietary supplements and vitamins can help with certain types of pain. Fish oil and flaxseed oil is often used to reduce pain associated with swelling. Topical capsaicin, derived from chili peppers, may help with arthritis, diabetic nerve pain, and other conditions. There's evidence that glucosamine can help relieve moderate to severe pain from osteoarthritis in the knee.  A spice Tumeric is used my many to treat chronic pain.  Curcumin is the active  ingredient and thought to have anti-inflammatory effects and reduces pain and stiffness.  This can be found as capsules or extract (more likely to be free of contaminants). For OA: Capsule, typically 400 mg to 600 mg, three times per day; or 0.5 g to 1 g of powdered root up to 3 g per day. For RA: 500 mg twice daily.  It's best absorbed if it contains black pepper.    May add 4 oz tart cherry juice as well.    But when it comes to supplements, you have to be careful. High doses of B6 can damage the nerves.   Some studies suggest that supplements such as ginkgo biloba and ginseng can thin the blood and increase the risk of bleeding. This could lead to serious consequences for anyone getting surgery for chronic pain.  Finally, supplements don't always contain what they claim as supplement manufacturers are not regulated by the FDA.  I get very confused with the thousands of supplements available and which to choose.  Brands to consider: [employer], [employer], [employer], [employer], [employer], Dannielle Huh and Rosetta Posner   I usually go on [employer] and look for the highly rated products.  [employer] or [employer] are great resources and have done the research for you.     Cognitive Behavioral Therapy. Some people with chronic pain balk at the idea of seeing a therapist -- they think it implies that their pain isn't real. But studies show that depression and chronic pain often go together. Chronic pain can cause or worsen depression; depression can lower a person's tolerance for pain.  Stress-reduction techniques. There are number of approaches,  including: Yoga. There's good evidence that yoga can help with chronic pain,  specifically fibromyalgia, neck pain, back pain, and arthritis. Relaxation therapy. This is actually a category of techniques that help people calm the body and release tension -- a process that might also reduce pain. Some approaches teach people how to focus on their  breathing. Research shows that relaxation therapy can help with fibromyalgia, headache, osteoarthritis, and other conditions. Hypnosis. Studies have found this approach helpful with different sorts of pain, like back pain, repetitive strain injuries, and cancer pain. Guided imagery. Research shows that guided imagery can help with conditions like headache pain, cancer pain, osteoarthritis, and fibromyalgia. How does it work? An expert would teach you ways to direct your thoughts by focusing on specific images. Music therapy. This approach gets people to either perform or listen to music. Studies have found that it can help with many different pain conditions, like osteoarthritis and cancer pain. Biofeedback. This approach teaches you how to control normally unconscious bodily functions, like blood pressure or your heart rate. Studies have found that it can help with headaches, fibromyalgia, and other conditions. Massage. It's undeniably relaxing. And there's some evidence that massage can help ease pain from rheumatoid arthritis, neck and back injuries, and fibromyalgia.  Risky Alternative Pain Treatments Experts say you should keep your expectations for alternative pain treatments modest -- especially when it comes to "miracle cures." Controlling chronic pain is not simple. A single supplement, device, or treatment is not going to make your chronic pain disappear. You a need to be suspicious of anyone pushing a treatment when the financial motive is blatant. That doesn't only apply to pain treatments advertised on dubious web sites asking for your credit card number.  Experts say that you should try to keep up to date with research into alternative treatments for chronic pain. The options for people with chronic pain are always growing -- and some of the odder treatments of today might become the mainstream treatments of tomorrow.

## 2014-12-05 NOTE — Assessment & Plan Note (Signed)
Will change compression stockings with hand written rx to 20/20 compression.  Refill Kenalog cream

## 2014-12-05 NOTE — Progress Notes (Signed)
   BP 169/91 mmHg  Pulse 81  Temp(Src) 98.4 F (36.9 C)  Ht 5' 3.1" (1.603 m)  Wt 342 lb 3.2 oz (155.221 kg)  BMI 60.41 kg/m2  SpO2 96%   Subjective:    Patient ID: [name], female    DOB: 02/02/1941, 74 y.o.   MRN: [id number]  HPI: [name] is a 74 y.o. female  Chief Complaint  Patient presents with  . Back Pain  . Leg Pain  . Medication Refill    patient states she needs the kenalog cream refilled   Back pain and sciatica F/U on this.  Pt states "if you can keep me on Prednisone, I would be walking now."  States her back pain and sciatica is better.    Lymphedema Lost 8 pounds.  Would like a stocking that is a little looser around  The ankles.  Stockings are 20/30 now and would like 20/20.    Hypertension BP is high today.  States she "just took" her BP medication.  She is not monitoring at home but will start.    Relevant past medical, surgical, family and social history reviewed and updated as indicated. Interim medical history since our last visit reviewed. Allergies and medications reviewed and updated.  Review of Systems  Per HPI unless specifically indicated above     Objective:    BP 169/91 mmHg  Pulse 81  Temp(Src) 98.4 F (36.9 C)  Ht 5' 3.1" (1.603 m)  Wt 342 lb 3.2 oz (155.221 kg)  BMI 60.41 kg/m2  SpO2 96%  Wt Readings from Last 3 Encounters:  12/05/14 342 lb 3.2 oz (155.221 kg)  10/31/14 350 lb 6.4 oz (158.94 kg)  07/26/14 348 lb (157.852 kg)    Physical Exam  Constitutional: She is oriented to person, place, and time. She appears well-developed and well-nourished. No distress.  HENT:  Head: Normocephalic and atraumatic.  Eyes: Conjunctivae and lids are normal. Right eye exhibits no discharge. Left eye exhibits no discharge. No scleral icterus.  Cardiovascular: Normal rate and regular rhythm.   Pulmonary/Chest: Effort normal. No respiratory distress.  Abdominal: Normal appearance. There is no splenomegaly or hepatomegaly.   Musculoskeletal: Normal range of motion.  Neurological: She is alert and oriented to person, place, and time.  Skin: Skin is intact. No rash noted. No pallor.  Psychiatric: She has a normal mood and affect. Her behavior is normal. Judgment and thought content normal.    No results found for this or any previous visit.    Assessment & Plan:   Problem List Items Addressed This Visit      Cardiovascular and Mediastinum   Essential hypertension, benign     Other   Lymphedema - Primary    Will change compression stockings with hand written rx to 20/20 compression.  Refill Kenalog cream       Other Visit Diagnoses    Sciatica, right        Improved        Follow up plan: Return in about 3 months (around 03/07/2015).

## 2015-01-04 ENCOUNTER — Other Ambulatory Visit: Payer: Self-pay | Admitting: Unknown Physician Specialty

## 2015-01-19 ENCOUNTER — Other Ambulatory Visit: Payer: Self-pay | Admitting: Unknown Physician Specialty

## 2015-01-27 ENCOUNTER — Encounter: Payer: Self-pay | Admitting: Unknown Physician Specialty

## 2015-01-27 ENCOUNTER — Ambulatory Visit (INDEPENDENT_AMBULATORY_CARE_PROVIDER_SITE_OTHER): Payer: BC Managed Care – PPO | Admitting: Unknown Physician Specialty

## 2015-01-27 VITALS — BP 159/99 | HR 84 | Temp 97.4°F | Ht 63.1 in | Wt 356.0 lb

## 2015-01-27 DIAGNOSIS — J40 Bronchitis, not specified as acute or chronic: Secondary | ICD-10-CM | POA: Diagnosis not present

## 2015-01-27 DIAGNOSIS — J4531 Mild persistent asthma with (acute) exacerbation: Secondary | ICD-10-CM | POA: Diagnosis not present

## 2015-01-27 DIAGNOSIS — J0100 Acute maxillary sinusitis, unspecified: Secondary | ICD-10-CM | POA: Diagnosis not present

## 2015-01-27 MED ORDER — HYDROCOD POLST-CPM POLST ER 10-8 MG/5ML PO SUER: 5.0000 mL | Freq: Two times a day (BID) | ORAL | Status: DC

## 2015-01-27 MED ORDER — AZITHROMYCIN 250 MG PO TABS: ORAL_TABLET | ORAL | Status: DC

## 2015-01-27 NOTE — Progress Notes (Signed)
BP 159/99 mmHg  Pulse 84  Temp(Src) 97.4 F (36.3 C)  Ht 5' 3.1" (1.603 m)  Wt 356 lb (161.481 kg)  BMI 62.84 kg/m2  SpO2 98%   Subjective:    Patient ID: [name], female    DOB: [birth year], 74 y.o.   MRN: [id number]  HPI: [name] is a 74 y.o. female  Chief Complaint  Patient presents with  . Sinusitis    pt states she had sore throat but is gone, still has cough, runny nose, and chest congestion. States she has been coughing up grey/green colored "stuff"   Cough This is a new problem. The current episode started in the past 7 days. The problem has been gradually worsening. The problem occurs constantly. The cough is productive of purulent sputum. Associated symptoms include chills, headaches, myalgias, nasal congestion, rhinorrhea, a sore throat, sweats and wheezing. Pertinent negatives include no chest pain, ear congestion, ear pain, fever, heartburn, hemoptysis or weight loss. Nothing aggravates the symptoms. Treatments tried: rocknride. The treatment provided no relief. Her past medical history is significant for asthma.   Significant for history of asthma.    Relevant past medical, surgical, family and social history reviewed and updated as indicated. Interim medical history since our last visit reviewed. Allergies and medications reviewed and updated.  Review of Systems  Constitutional: Positive for chills. Negative for fever and weight loss.  HENT: Positive for rhinorrhea and sore throat. Negative for ear pain.   Respiratory: Positive for cough and wheezing. Negative for hemoptysis.   Cardiovascular: Negative for chest pain.  Gastrointestinal: Negative for heartburn.  Musculoskeletal: Positive for myalgias.  Neurological: Positive for headaches.    Per HPI unless specifically indicated above     Objective:    BP 159/99 mmHg  Pulse 84  Temp(Src) 97.4 F (36.3 C)  Ht 5' 3.1" (1.603 m)  Wt 356 lb (161.481 kg)  BMI 62.84 kg/m2  SpO2 98%  Wt Readings from  Last 3 Encounters:  01/27/15 356 lb (161.481 kg)  12/05/14 342 lb 3.2 oz (155.221 kg)  10/31/14 350 lb 6.4 oz (158.94 kg)    Physical Exam  Constitutional: She is oriented to person, place, and time. She appears well-developed and well-nourished. No distress.  HENT:  Head: Normocephalic and atraumatic.  Right Ear: Tympanic membrane and ear canal normal.  Left Ear: Tympanic membrane and ear canal normal.  Nose: Mucosal edema and rhinorrhea present.  Mouth/Throat: Posterior oropharyngeal edema present.  Eyes: Conjunctivae and lids are normal. Right eye exhibits no discharge. Left eye exhibits no discharge. No scleral icterus.  Cardiovascular: Normal rate and regular rhythm.   Pulmonary/Chest: Effort normal and breath sounds normal. No respiratory distress.  Abdominal: Normal appearance. There is no splenomegaly or hepatomegaly.  Musculoskeletal: Normal range of motion.  Neurological: She is alert and oriented to person, place, and time.  Skin: Skin is warm, dry and intact. No rash noted. No pallor.  Psychiatric: She has a normal mood and affect. Her behavior is normal. Judgment and thought content normal.  Nursing note and vitals reviewed.   No results found for this or any previous visit.    Assessment & Plan:   Problem List Items Addressed This Visit    None    Visit Diagnoses    Acute maxillary sinusitis, recurrence not specified    -  Primary    Relevant Medications    chlorpheniramine-HYDROcodone (TUSSIONEX PENNKINETIC ER) 10-8 MG/5ML SUER    azithromycin (ZITHROMAX Z-PAK) 250 MG tablet  Bronchitis        Asthma with acute exacerbation, mild persistent          Antibiotic given to only take if symptoms worse after 5 days or no improvement after 10 days.     Follow up plan: Return if symptoms worsen or fail to improve.

## 2015-02-06 ENCOUNTER — Ambulatory Visit: Payer: Medicare Other | Admitting: Unknown Physician Specialty

## 2015-02-09 ENCOUNTER — Other Ambulatory Visit: Payer: Self-pay | Admitting: Unknown Physician Specialty

## 2015-02-22 ENCOUNTER — Ambulatory Visit (INDEPENDENT_AMBULATORY_CARE_PROVIDER_SITE_OTHER): Payer: BC Managed Care – PPO | Admitting: Unknown Physician Specialty

## 2015-02-22 ENCOUNTER — Encounter: Payer: Self-pay | Admitting: Unknown Physician Specialty

## 2015-02-22 VITALS — BP 162/84 | HR 92 | Temp 98.0°F | Ht 62.6 in | Wt 355.0 lb

## 2015-02-22 DIAGNOSIS — Z7189 Other specified counseling: Secondary | ICD-10-CM

## 2015-02-22 DIAGNOSIS — I890 Lymphedema, not elsewhere classified: Secondary | ICD-10-CM | POA: Diagnosis not present

## 2015-02-22 DIAGNOSIS — I10 Essential (primary) hypertension: Secondary | ICD-10-CM | POA: Diagnosis not present

## 2015-02-22 DIAGNOSIS — Z23 Encounter for immunization: Secondary | ICD-10-CM

## 2015-02-22 LAB — LIPID PANEL PICCOLO, WAIVED

## 2015-02-22 NOTE — Progress Notes (Signed)
BP 162/84 mmHg  Pulse 92  Temp(Src) 98 F (36.7 C)  Ht 5' 2.6" (1.59 m)  Wt 355 lb (161.027 kg)  BMI 63.69 kg/m2  SpO2 97%  LMP  (LMP Unknown)   Subjective:    Patient ID: [name], female    DOB: 10-18-1940, 74 y.o.   MRN: [id number]  HPI: [name] is a 74 y.o. female  Chief Complaint  Patient presents with  . Hypertension  . OTHER    pt states she wants to make sure her lungs are clear   Hypertension This is a chronic problem the pt is satisfied with the current treatment.  Blood pressure elevated today however the pt states she did not take her blood pressure medication yesterday or today.  She has a lot of stressors and states when she takes her blood pressure medication her blood pressure is within normal limits.  Her blood pressure at home is 108/76.  Pertinent negatives denies chest pain, palpitations, dizziness, visual changes or headaches  Lymphedema This is a chronic problem the pt states she continues to use the Kenalog cream, applying compression stockings, and see's a massage therapist.  The problem is stable with the abovementioned interventions.  Pertinent negatives denies rash, erythema, or warmth.  Advance Care Planning Spent 40 minutes with pt. Discussing and providing paperwork about advance care planning.  She was concerned about code status for herself and her husband.  She stated she would let us know what her wishes are concerning her code status after she discusses it with her husband.  Relevant past medical, surgical, family and social history reviewed and updated as indicated. Interim medical history since our last visit reviewed. AllergAdies and medications reviewed and updated.  Review of Systems  Constitutional: Negative for fever, fatigue and unexpected weight change.  HENT: Negative.   Eyes: Negative.   Respiratory: Negative for cough, chest tightness and wheezing.   Cardiovascular: Positive for leg swelling. Negative for chest pain and  palpitations.  Gastrointestinal: Negative for nausea, vomiting, abdominal pain and diarrhea.  Genitourinary: Negative.   Musculoskeletal: Positive for arthralgias.  Skin: Negative.   Neurological: Negative for dizziness, tremors, seizures, weakness, light-headedness, numbness and headaches.  Psychiatric/Behavioral: Negative.     Per HPI unless specifically indicated above     Objective:    BP 162/84 mmHg  Pulse 92  Temp(Src) 98 F (36.7 C)  Ht 5' 2.6" (1.59 m)  Wt 355 lb (161.027 kg)  BMI 63.69 kg/m2  SpO2 97%  LMP  (LMP Unknown)  Wt Readings from Last 3 Encounters:  02/22/15 355 lb (161.027 kg)  01/27/15 356 lb (161.481 kg)  12/05/14 342 lb 3.2 oz (155.221 kg)    Physical Exam  Constitutional: She is oriented to person, place, and time. She appears well-developed and well-nourished. No distress.  HENT:  Head: Normocephalic and atraumatic.  Right Ear: External ear normal.  Left Ear: External ear normal.  Nose: Nose normal.  Neck: Normal range of motion. Neck supple.  Cardiovascular: Normal rate, regular rhythm, normal heart sounds and intact distal pulses.   Pulmonary/Chest: Effort normal and breath sounds normal. No respiratory distress. She has no wheezes.  Musculoskeletal: Normal range of motion. She exhibits no tenderness.  Neurological: She is alert and oriented to person, place, and time.  Skin: Skin is warm and dry. No rash noted. She is not diaphoretic. No erythema. No pallor.  Psychiatric: She has a normal mood and affect. Her behavior is normal. Judgment and thought content normal.  No results found for this or any previous visit.    Assessment & Plan:   Problem List Items Addressed This Visit      Unprioritized   Lymphedema    Stable, continue present medications.        Essential hypertension, benign - Primary    Not controlled today in the office at home blood pressure is controlled.  Instructed to notify us if blood pressure is elevated at  home Continue current medications Lipid Panel ordered results pending Comprehensive Metabolic Panel ordered results pending Uric Acid ordered results pending      Relevant Orders   Lipid Panel Piccolo, Waived   Uric acid   Comprehensive metabolic panel    Other Visit Diagnoses    Immunization due        Relevant Orders    Flu Vaccine QUAD 36+ mos PF IM (Fluarix & Fluzone Quad PF) (Completed)    Advanced care planning/counseling discussion        Pt given forms and will discuss DNR and living will at next visit.           At least 40 minutes spent on this visit.    Follow up plan: Return in about 6 months (around 08/23/2015).

## 2015-02-22 NOTE — Assessment & Plan Note (Addendum)
Not controlled today in the office at home blood pressure is controlled.  Instructed to notify us if blood pressure is elevated at home Continue current medications Lipid Panel ordered results pending Comprehensive Metabolic Panel ordered results pending Uric Acid ordered results pending

## 2015-02-22 NOTE — Assessment & Plan Note (Signed)
Stable, continue present medications.   

## 2015-02-23 LAB — COMPREHENSIVE METABOLIC PANEL
A/G RATIO: 1.8 (ref 1.1–2.5)
ALT: 16 IU/L (ref 0–32)
AST: 15 IU/L (ref 0–40)
Albumin: 4.2 g/dL (ref 3.5–4.8)
Alkaline Phosphatase: 75 IU/L (ref 39–117)
BUN/Creatinine Ratio: 19 (ref 11–26)
BUN: 15 mg/dL (ref 8–27)
Bilirubin Total: 0.5 mg/dL (ref 0.0–1.2)
CALCIUM: 9.9 mg/dL (ref 8.7–10.3)
CO2: 21 mmol/L (ref 18–29)
CREATININE: 0.81 mg/dL (ref 0.57–1.00)
Chloride: 103 mmol/L (ref 97–108)
GFR, EST AFRICAN AMERICAN: 83 mL/min/{1.73_m2} (ref >59)
GFR, EST NON AFRICAN AMERICAN: 72 mL/min/{1.73_m2} (ref >59)
GLOBULIN, TOTAL: 2.4 g/dL (ref 1.5–4.5)
Glucose: 97 mg/dL (ref 65–99)
POTASSIUM: 4.1 mmol/L (ref 3.5–5.2)
SODIUM: 141 mmol/L (ref 134–144)
TOTAL PROTEIN: 6.6 g/dL (ref 6.0–8.5)

## 2015-02-23 LAB — URIC ACID: Uric Acid: 6.3 mg/dL (ref 2.5–7.1)

## 2015-02-25 LAB — LIPID PANEL W/O CHOL/HDL RATIO
Cholesterol, Total: 225 mg/dL — ABNORMAL HIGH (ref 100–199)
HDL: 97 mg/dL (ref >39)
LDL Calculated: 110 mg/dL — ABNORMAL HIGH (ref 0–99)
Triglycerides: 90 mg/dL (ref 0–149)
VLDL CHOLESTEROL CAL: 18 mg/dL (ref 5–40)

## 2015-02-27 LAB — SPECIMEN STATUS REPORT

## 2015-02-28 ENCOUNTER — Telehealth: Payer: Self-pay

## 2015-02-28 NOTE — Telephone Encounter (Signed)
Called and spoke to patient because we got a fax from [facility] stating that they are working with the patient to get some compression garments. So I asked the patient what lymphedema clinic she goes to and patient stated that she has not been there because she was supposed to be put on the waiting list for them. Patient gave me the name of her massage therapist, Tomasita Morrow, who she also states is certified in lymphedema. Spoke to Bobo about this and I am waiting on the patient to call me back with the therapist number so I can find out what compression the patient needs.

## 2015-03-01 NOTE — Telephone Encounter (Signed)
Called [facility] pharmacy to see about getting the measurements for the patient. They stated that they have not measured the patient since February 2016. They stated that the patient got "flat knit" stockings before. I told the lady I spoke to that [facility] was the one who faxed us about the compression garments and she stated that they are a competitor so they should be the ones to do the measuring and that they would probably not be able to give her the "flat knit" stockings.

## 2015-03-01 NOTE — Telephone Encounter (Signed)
Tomasita MorrowNancy Ballard returned my call. She stated she has NOT measured Mirha for compression. She explained that she thought Kathie RhodesBetty got it done at [facility]. I told her that if she had any questions or found out anything else to give us a call.

## 2015-03-01 NOTE — Telephone Encounter (Signed)
Called Tomasita MorrowNancy Ballard (massage therapist) about which compression garment the pt needs. Tomasita MorrowNancy Ballard did not answer but left a detailed message and told to return call.

## 2015-03-01 NOTE — Telephone Encounter (Signed)
What I need from [name] is not the measurements, but the amount of compression required.  For example, graduated compression stockings would be 30 mm hg at ankle.  Kathie RhodesBetty might need something different.  Once they know the compression, they can measure her.

## 2015-03-01 NOTE — Telephone Encounter (Signed)
Barbara MorrowNancy [name] called stated she is returning a call. Please call her back ASAP. Thanks.

## 2015-03-02 NOTE — Telephone Encounter (Signed)
I called Kathie RhodesBetty and told her we needed to know the amount of compression. She said to call [facility], and the associate told me they didn't have anything on file for her. Kathie RhodesBetty also said to call [facility], because that's where she was previously. I called the wound center and requested a fax with the information requested. She confirmed and said I would get a fax shortly.

## 2015-03-10 ENCOUNTER — Other Ambulatory Visit: Payer: Self-pay | Admitting: Unknown Physician Specialty

## 2015-03-10 NOTE — Telephone Encounter (Signed)
Called [facility] and asked them about the paperwork that they said they were going to send last week. They stated that they did send it but I told them that we have not yet gotten it. So I gave them our fax number again and they stated they would send it again. I spoke first to [place]ia and then to Bell ArthurJoann.

## 2015-03-13 NOTE — Telephone Encounter (Signed)
[facility] over information Barbara MaxwellCheryl needs. Will show it to her when she returns so we can proceed with this patient.

## 2015-03-15 NOTE — Telephone Encounter (Signed)
[name] filled out a RX after looking over what the wound center faxed. So I faxed the RX [name] wrote to [facility] that they were requesting. I wrote on the transmittal sheet to please give us a call if needed.

## 2015-03-31 ENCOUNTER — Other Ambulatory Visit: Payer: Self-pay

## 2015-03-31 MED ORDER — NEBIVOLOL HCL 10 MG PO TABS: 10.0000 mg | ORAL_TABLET | Freq: Every day | ORAL | Status: DC

## 2015-03-31 NOTE — Telephone Encounter (Signed)
Patient was last 02/22/15 and has 6 month scheduled in April. [place] [facility] [place]

## 2015-04-04 ENCOUNTER — Encounter: Payer: Self-pay | Admitting: Family Medicine

## 2015-04-04 ENCOUNTER — Ambulatory Visit (INDEPENDENT_AMBULATORY_CARE_PROVIDER_SITE_OTHER): Payer: BC Managed Care – PPO | Admitting: Family Medicine

## 2015-04-04 VITALS — BP 156/72 | HR 67 | Temp 97.7°F | Ht 66.0 in | Wt 368.0 lb

## 2015-04-04 DIAGNOSIS — I890 Lymphedema, not elsewhere classified: Secondary | ICD-10-CM

## 2015-04-04 MED ORDER — MUPIROCIN CALCIUM 2 % EX CREA: 1.0000 "application " | TOPICAL_CREAM | Freq: Two times a day (BID) | CUTANEOUS | Status: DC

## 2015-04-04 NOTE — Assessment & Plan Note (Signed)
With secondary cellulitis

## 2015-04-04 NOTE — Progress Notes (Signed)
BP 156/72 mmHg  Pulse 67  Temp(Src) 97.7 F (36.5 C)  Ht  (1.676 m)  Wt 368 lb (166.924 kg)  BMI 59.43 kg/m2  LMP  (LMP Unknown)   Subjective:    Patient ID: [name], female    DOB: 1940-06-07, 74 y.o.   MRN: [id number]  HPI: [name] is a 74 y.o. female  Chief Complaint  Patient presents with  . leg wound    left, "heat" bilateral  . Chills    since yesterday   patient with weeping left anterior shin area started yesterday with minor trauma patient's had long history of venous stasis ulcers and secondary cellulitis. Been treated in the wound clinic has compression hose and compression pumps which he uses for an hour a morning and evening every day. Patient with no CHF symptoms nocturia PND orthopnea No observed sleep apnea symptoms Patient concerned may be developing some cellulitis symptoms has used antibiotics in the past  Patient also developing some white lesions on her tongue that's not sore no irritation has noticed over the last month or so. Has looked it up and is thinking about geographic tongue.   Relevant past medical, surgical, family and social history reviewed and updated as indicated. Interim medical history since our last visit reviewed. Allergies and medications reviewed and updated.  Review of Systems  Constitutional: Negative.   Respiratory: Negative.   Cardiovascular: Negative.     Per HPI unless specifically indicated above     Objective:    BP 156/72 mmHg  Pulse 67  Temp(Src) 97.7 F (36.5 C)  Ht  (1.676 m)  Wt 368 lb (166.924 kg)  BMI 59.43 kg/m2  LMP  (LMP Unknown)  Wt Readings from Last 3 Encounters:  04/04/15 368 lb (166.924 kg)  02/22/15 355 lb (161.027 kg)  01/27/15 356 lb (161.481 kg)    Physical Exam  Constitutional: She is oriented to person, place, and time. She appears well-developed and well-nourished. No distress.  HENT:  Head: Normocephalic and atraumatic.  Right Ear: Hearing normal.  Left Ear: Hearing  normal.  Nose: Nose normal.  Eyes: Conjunctivae and lids are normal. Right eye exhibits no discharge. Left eye exhibits no discharge. No scleral icterus.  Cardiovascular: Normal rate, regular rhythm and normal heart sounds.   Pulmonary/Chest: Effort normal and breath sounds normal. No respiratory distress.  Musculoskeletal: Normal range of motion.  Neurological: She is alert and oriented to person, place, and time.  Skin: Skin is intact. No rash noted.  Legs with some induration one little tiny open wound area covered by a Band-Aid Also some areas of secondary cellulitis Legs and general are soft with minimal edema  Psychiatric: She has a normal mood and affect. Her speech is normal and behavior is normal. Judgment and thought content normal. Cognition and memory are normal.    Results for orders placed or performed in visit on 02/22/15  Lipid Panel Piccolo, [employer]  Result Value Ref Range   Cholesterol Piccolo, Waived CANCELED mg/dL   HDL Chol Piccolo, Waived CANCELED    Triglycerides Piccolo,Waived CANCELED   Uric acid  Result Value Ref Range   Uric Acid 6.3 2.5 - 7.1 mg/dL  Comprehensive metabolic panel  Result Value Ref Range   Glucose 97 65 - 99 mg/dL   BUN 15 8 - 27 mg/dL   Creatinine, Ser 0.86 0.57 - 1.00 mg/dL   GFR calc non Af Amer 72 >59 mL/min/1.73   GFR calc Af Amer 83 >59  mL/min/1.73   BUN/Creatinine Ratio 19 11 - 26   Sodium 141 134 - 144 mmol/L   Potassium 4.1 3.5 - 5.2 mmol/L   Chloride 103 97 - 108 mmol/L   CO2 21 18 - 29 mmol/L   Calcium 9.9 8.7 - 10.3 mg/dL   Total Protein 6.6 6.0 - 8.5 g/dL   Albumin 4.2 3.5 - 4.8 g/dL   Globulin, Total 2.4 1.5 - 4.5 g/dL   Albumin/Globulin Ratio 1.8 1.1 - 2.5   Bilirubin Total 0.5 0.0 - 1.2 mg/dL   Alkaline Phosphatase 75 39 - 117 IU/L   AST 15 0 - 40 IU/L   ALT 16 0 - 32 IU/L  Lipid Panel w/o Chol/HDL Ratio  Result Value Ref Range   Cholesterol, Total 225 (H) 100 - 199 mg/dL   Triglycerides 90 0 - 149 mg/dL   HDL  97 >16>39 mg/dL   VLDL Cholesterol Cal 18 5 - 40 mg/dL   LDL Calculated 109110 (H) 0 - 99 mg/dL  Specimen status report  Result Value Ref Range   specimen status report Comment       Assessment & Plan:   Problem List Items Addressed This Visit      Other   Lymphedema - Primary    With secondary cellulitis      Relevant Medications   mupirocin cream (BACTROBAN) 2 %       Follow up plan: Return for As scheduled.

## 2015-04-06 ENCOUNTER — Other Ambulatory Visit: Payer: Self-pay | Admitting: Unknown Physician Specialty

## 2015-04-25 ENCOUNTER — Other Ambulatory Visit: Payer: Self-pay | Admitting: Family Medicine

## 2015-04-25 NOTE — Telephone Encounter (Signed)
Your patient 

## 2015-05-03 ENCOUNTER — Other Ambulatory Visit: Payer: Self-pay | Admitting: Family Medicine

## 2015-05-03 ENCOUNTER — Other Ambulatory Visit: Payer: Self-pay | Admitting: Unknown Physician Specialty

## 2015-05-25 ENCOUNTER — Other Ambulatory Visit: Payer: Self-pay | Admitting: Unknown Physician Specialty

## 2015-06-01 ENCOUNTER — Other Ambulatory Visit: Payer: Self-pay | Admitting: Unknown Physician Specialty

## 2015-06-19 ENCOUNTER — Ambulatory Visit (INDEPENDENT_AMBULATORY_CARE_PROVIDER_SITE_OTHER): Payer: BC Managed Care – PPO | Admitting: Unknown Physician Specialty

## 2015-06-19 ENCOUNTER — Encounter: Payer: Self-pay | Admitting: Unknown Physician Specialty

## 2015-06-19 VITALS — BP 126/73 | HR 69 | Temp 98.5°F | Ht 62.0 in | Wt 346.4 lb

## 2015-06-19 DIAGNOSIS — R059 Cough, unspecified: Secondary | ICD-10-CM

## 2015-06-19 DIAGNOSIS — R05 Cough: Secondary | ICD-10-CM | POA: Diagnosis not present

## 2015-06-19 DIAGNOSIS — J4531 Mild persistent asthma with (acute) exacerbation: Secondary | ICD-10-CM

## 2015-06-19 MED ORDER — ALBUTEROL SULFATE HFA 108 (90 BASE) MCG/ACT IN AERS: 2.0000 | INHALATION_SPRAY | Freq: Four times a day (QID) | RESPIRATORY_TRACT | Status: DC | PRN

## 2015-06-19 MED ORDER — CLARITHROMYCIN 500 MG PO TABS: 500.0000 mg | ORAL_TABLET | Freq: Two times a day (BID) | ORAL | Status: DC

## 2015-06-19 MED ORDER — PREDNISONE 20 MG PO TABS: 20.0000 mg | ORAL_TABLET | Freq: Every day | ORAL | Status: DC

## 2015-06-19 MED ORDER — HYDROCOD POLST-CPM POLST ER 10-8 MG/5ML PO SUER: 5.0000 mL | Freq: Two times a day (BID) | ORAL | Status: DC | PRN

## 2015-06-19 MED ORDER — ALBUTEROL SULFATE (2.5 MG/3ML) 0.083% IN NEBU: 2.5000 mg | INHALATION_SOLUTION | Freq: Once | RESPIRATORY_TRACT | Status: DC

## 2015-06-19 NOTE — Progress Notes (Signed)
BP 126/73 mmHg  Pulse 69  Temp(Src) 98.5 F (36.9 C)  Ht  (1.575 m)  Wt 346 lb 6.4 oz (157.126 kg)  BMI 63.34 kg/m2  SpO2 95%  LMP  (LMP Unknown)   Subjective:    Patient ID: [name], female    DOB: 1940-08-29, 75 y.o.   MRN: [id number]  HPI: [name] is a 75 y.o. female  Chief Complaint  Patient presents with  . URI    pt states she has dry throat, chest congestion, cough, and some nasal congestion. Patient states symptoms started about a week ago  . Medication Refill    pt states she needs refill on bystolic, bupropion, and albuterol   Started with heaviness in chest. Coughing up yellow/green mucus. Feels like a spasm. Feels out of breath. Mid back is sore, chest is sore. Patient thinks symptoms are viral. Hasn't checked temperature. Similar symptoms last fall but not as severe.  Been sleeping in chair last 7 nights. Off and on sleeping. Out of albuterol this morning. Patient is tired. Tried Rock and rye for cough, not helped. Tried Defend, benadryl, mucinex, tylenol with minimal improvement with congestion but no improvement with coughing.   ASTHMA Triggers: Symptoms are well controlled:  Using medications without problems: yes, uses albuterol every 6 hours Night time symptoms: yes Wheeze/SOB: feels often ER visits since last visit: Missed work or school:  Allergens:    Relevant past medical, surgical, family and social history reviewed and updated as indicated. Interim medical history since our last visit reviewed. Allergies and medications reviewed and updated.  Review of Systems  Constitutional: Negative.   HENT: Negative.   Eyes: Negative.   Respiratory: Positive for cough and wheezing.   Cardiovascular: Negative.   Gastrointestinal: Negative.   Endocrine: Negative.   Genitourinary: Negative.   Musculoskeletal: Negative.   Skin: Negative.   Allergic/Immunologic: Negative.   Neurological: Negative.   Hematological: Negative.    Psychiatric/Behavioral: Negative.     Per HPI unless specifically indicated above     Objective:    BP 126/73 mmHg  Pulse 69  Temp(Src) 98.5 F (36.9 C)  Ht  (1.575 m)  Wt 346 lb 6.4 oz (157.126 kg)  BMI 63.34 kg/m2  SpO2 95%  LMP  (LMP Unknown)  Wt Readings from Last 3 Encounters:  06/19/15 346 lb 6.4 oz (157.126 kg)  04/04/15 368 lb (166.924 kg)  02/22/15 355 lb (161.027 kg)    Physical Exam  Constitutional: She is oriented to person, place, and time. She appears well-developed and well-nourished. No distress.  HENT:  Head: Normocephalic and atraumatic.  Right Ear: Tympanic membrane and ear canal normal.  Left Ear: Tympanic membrane and ear canal normal.  Nose: Right sinus exhibits no maxillary sinus tenderness and no frontal sinus tenderness. Left sinus exhibits no maxillary sinus tenderness and no frontal sinus tenderness.  Mouth/Throat: Mucous membranes are normal.  Eyes: Conjunctivae and lids are normal. Right eye exhibits no discharge. Left eye exhibits no discharge. No scleral icterus.  Cardiovascular: Normal rate and regular rhythm.   Pulmonary/Chest: She is in respiratory distress. She has wheezes in the left lower field.  Abdominal: Normal appearance. There is no splenomegaly or hepatomegaly.  Musculoskeletal: Normal range of motion.  Neurological: She is alert and oriented to person, place, and time.  Skin: Skin is intact. No rash noted. No pallor.  Psychiatric: She has a normal mood and affect. Her behavior is normal. Judgment and thought content normal.  Results for orders placed or performed in visit on 02/22/15  Lipid Panel Piccolo, [employer]  Result Value Ref Range   Cholesterol Piccolo, Waived CANCELED mg/dL   HDL Chol Piccolo, Waived CANCELED    Triglycerides Piccolo,Waived CANCELED   Uric acid  Result Value Ref Range   Uric Acid 6.3 2.5 - 7.1 mg/dL  Comprehensive metabolic panel  Result Value Ref Range   Glucose 97 65 - 99 mg/dL   BUN  15 8 - 27 mg/dL   Creatinine, Ser 1.19 0.57 - 1.00 mg/dL   GFR calc non Af Amer 72 >59 mL/min/1.73   GFR calc Af Amer 83 >59 mL/min/1.73   BUN/Creatinine Ratio 19 11 - 26   Sodium 141 134 - 144 mmol/L   Potassium 4.1 3.5 - 5.2 mmol/L   Chloride 103 97 - 108 mmol/L   CO2 21 18 - 29 mmol/L   Calcium 9.9 8.7 - 10.3 mg/dL   Total Protein 6.6 6.0 - 8.5 g/dL   Albumin 4.2 3.5 - 4.8 g/dL   Globulin, Total 2.4 1.5 - 4.5 g/dL   Albumin/Globulin Ratio 1.8 1.1 - 2.5   Bilirubin Total 0.5 0.0 - 1.2 mg/dL   Alkaline Phosphatase 75 39 - 117 IU/L   AST 15 0 - 40 IU/L   ALT 16 0 - 32 IU/L  Lipid Panel w/o Chol/HDL Ratio  Result Value Ref Range   Cholesterol, Total 225 (H) 100 - 199 mg/dL   Triglycerides 90 0 - 149 mg/dL   HDL 97 >14 mg/dL   VLDL Cholesterol Cal 18 5 - 40 mg/dL   LDL Calculated 782 (H) 0 - 99 mg/dL  Specimen status report  Result Value Ref Range   specimen status report Comment       Assessment & Plan:   Problem List Items Addressed This Visit    None    Visit Diagnoses    Cough    -  Primary    Relevant Medications    albuterol (PROVENTIL) (2.5 MG/3ML) 0.083% nebulizer solution 2.5 mg    Other Relevant Orders    DG Chest 2 View    Asthma, mild persistent, with acute exacerbation        Relevant Medications    predniSONE (DELTASONE) 20 MG tablet    albuterol (PROVENTIL HFA;VENTOLIN HFA) 108 (90 Base) MCG/ACT inhaler    albuterol (PROVENTIL) (2.5 MG/3ML) 0.083% nebulizer solution 2.5 mg        Follow up plan: Return if symptoms worsen or fail to improve.

## 2015-06-20 ENCOUNTER — Other Ambulatory Visit: Payer: Self-pay | Admitting: Unknown Physician Specialty

## 2015-06-20 ENCOUNTER — Ambulatory Visit
Admission: RE | Admit: 2015-06-20 | Discharge: 2015-06-20 | Disposition: A | Discharge status: Home / Self Care | Payer: BC Managed Care – PPO | Source: Ambulatory Visit | Attending: Unknown Physician Specialty | Admitting: Unknown Physician Specialty

## 2015-06-20 ENCOUNTER — Telehealth: Payer: Self-pay | Admitting: Unknown Physician Specialty

## 2015-06-20 DIAGNOSIS — J398 Other specified diseases of upper respiratory tract: Secondary | ICD-10-CM | POA: Insufficient documentation

## 2015-06-20 DIAGNOSIS — R918 Other nonspecific abnormal finding of lung field: Secondary | ICD-10-CM | POA: Insufficient documentation

## 2015-06-20 DIAGNOSIS — R05 Cough: Secondary | ICD-10-CM | POA: Diagnosis not present

## 2015-06-20 DIAGNOSIS — I517 Cardiomegaly: Secondary | ICD-10-CM | POA: Diagnosis not present

## 2015-06-20 DIAGNOSIS — R9389 Abnormal findings on diagnostic imaging of other specified body structures: Secondary | ICD-10-CM

## 2015-06-20 DIAGNOSIS — R0602 Shortness of breath: Secondary | ICD-10-CM | POA: Diagnosis not present

## 2015-06-20 DIAGNOSIS — R059 Cough, unspecified: Secondary | ICD-10-CM

## 2015-06-20 NOTE — Telephone Encounter (Signed)
Discussed with pt chest x-Hyer results.  Repeat chest x-Hoshino in 3-4 weeks.  Needs [place] of thyroid.

## 2015-06-23 ENCOUNTER — Telehealth: Payer: Self-pay

## 2015-06-23 NOTE — Telephone Encounter (Signed)
Patient called and left a voicemail asking about an ultrasound and a letter for work. Patient states that she spoke to people at her job and they said germs were still going around. I spoke to [place] about the [place] and she said they are waiting on the prior authorization from the insurance company. So I called and spoke to the patient all of this. I let her know that we are waiting on a prior auth from her insurance about the [place] and that she should hear from our office manager Monday. I asked patient about the letter and she states she needs it written through Monday and she will return to work on Tuesday. I asked Elnita Maxwell if I could write a new letter and she said yes. I told the patient the letter would be available for pickup Monday and it will be written first thing Monday morning.

## 2015-07-05 ENCOUNTER — Telehealth: Payer: Self-pay | Admitting: Unknown Physician Specialty

## 2015-07-05 ENCOUNTER — Ambulatory Visit
Admission: RE | Admit: 2015-07-05 | Discharge: 2015-07-05 | Disposition: A | Discharge status: Home / Self Care | Payer: BC Managed Care – PPO | Source: Ambulatory Visit | Attending: Unknown Physician Specialty | Admitting: Unknown Physician Specialty

## 2015-07-05 DIAGNOSIS — E042 Nontoxic multinodular goiter: Secondary | ICD-10-CM | POA: Diagnosis not present

## 2015-07-05 DIAGNOSIS — R938 Abnormal findings on diagnostic imaging of other specified body structures: Secondary | ICD-10-CM | POA: Insufficient documentation

## 2015-07-05 DIAGNOSIS — E0789 Other specified disorders of thyroid: Secondary | ICD-10-CM

## 2015-07-05 DIAGNOSIS — R9389 Abnormal findings on diagnostic imaging of other specified body structures: Secondary | ICD-10-CM

## 2015-07-05 DIAGNOSIS — E079 Disorder of thyroid, unspecified: Secondary | ICD-10-CM | POA: Diagnosis not present

## 2015-07-05 NOTE — Telephone Encounter (Signed)
Discussed with pt about thyroid ultrasound.  Need to refer to ENT.  She would like to see Dr Jenne Campus

## 2015-07-17 ENCOUNTER — Ambulatory Visit
Admission: RE | Admit: 2015-07-17 | Discharge: 2015-07-17 | Disposition: A | Discharge status: Home / Self Care | Payer: BC Managed Care – PPO | Source: Ambulatory Visit | Attending: Unknown Physician Specialty | Admitting: Unknown Physician Specialty

## 2015-07-17 DIAGNOSIS — R9389 Abnormal findings on diagnostic imaging of other specified body structures: Secondary | ICD-10-CM

## 2015-07-17 DIAGNOSIS — I517 Cardiomegaly: Secondary | ICD-10-CM | POA: Diagnosis not present

## 2015-07-17 DIAGNOSIS — R938 Abnormal findings on diagnostic imaging of other specified body structures: Secondary | ICD-10-CM | POA: Diagnosis not present

## 2015-07-17 DIAGNOSIS — R918 Other nonspecific abnormal finding of lung field: Secondary | ICD-10-CM | POA: Diagnosis not present

## 2015-07-18 ENCOUNTER — Other Ambulatory Visit: Payer: Self-pay | Admitting: Unknown Physician Specialty

## 2015-07-30 ENCOUNTER — Other Ambulatory Visit: Payer: Self-pay | Admitting: Unknown Physician Specialty

## 2015-07-31 ENCOUNTER — Other Ambulatory Visit: Payer: Self-pay | Admitting: Unknown Physician Specialty

## 2015-07-31 DIAGNOSIS — E041 Nontoxic single thyroid nodule: Secondary | ICD-10-CM

## 2015-08-01 ENCOUNTER — Ambulatory Visit: Payer: BC Managed Care – PPO | Admitting: Family Medicine

## 2015-08-02 ENCOUNTER — Ambulatory Visit (INDEPENDENT_AMBULATORY_CARE_PROVIDER_SITE_OTHER): Payer: BC Managed Care – PPO | Admitting: Family Medicine

## 2015-08-02 ENCOUNTER — Encounter: Payer: Self-pay | Admitting: Family Medicine

## 2015-08-02 VITALS — BP 166/71 | HR 75 | Temp 97.7°F | Wt 380.0 lb

## 2015-08-02 DIAGNOSIS — R0981 Nasal congestion: Secondary | ICD-10-CM | POA: Diagnosis not present

## 2015-08-02 DIAGNOSIS — I10 Essential (primary) hypertension: Secondary | ICD-10-CM

## 2015-08-02 MED ORDER — BENZONATATE 200 MG PO CAPS: 200.0000 mg | ORAL_CAPSULE | Freq: Two times a day (BID) | ORAL | Status: DC | PRN

## 2015-08-02 MED ORDER — PREDNISONE 50 MG PO TABS: 50.0000 mg | ORAL_TABLET | Freq: Every day | ORAL | Status: DC

## 2015-08-02 MED ORDER — HYDROCOD POLST-CPM POLST ER 10-8 MG/5ML PO SUER: 5.0000 mL | Freq: Every evening | ORAL | Status: DC | PRN

## 2015-08-02 NOTE — Progress Notes (Addendum)
BP 166/71 mmHg  Pulse 75  Temp(Src) 97.7 F (36.5 C)  Wt 380 lb (172.367 kg)  SpO2 96%  LMP  (LMP Unknown)   Subjective:    Patient ID: [name], female    DOB: 1940/09/23, 75 y.o.   MRN: [id number]  HPI: [name] is a 75 y.o. female  Chief Complaint  Patient presents with  . URI    Patient states that she has been sick since November, she will get better then worse again   UPPER RESPIRATORY TRACT INFECTION Duration: Has been having a cold on and off since November Worst symptom: drainage and cough Fever: no Cough: yes Shortness of breath: yes Wheezing: yes Chest pain: no Chest tightness: no Chest congestion: yes Nasal congestion: no Runny nose: yes Post nasal drip: yes Sneezing: yes Sore throat: yes Swollen glands: yes Sinus pressure: no Headache: yes Face pain: no Toothache: no Ear pain: no  Ear pressure: no  Eyes red/itching:no Eye drainage/crusting: no  Vomiting: no Rash: no Fatigue: yes Sick contacts: yes Strep contacts: yes  Context: better Recurrent sinusitis: no Relief with OTC cold/cough medications: no  Treatments attempted: saline, flonase, inhaler, benadryl   Relevant past medical, surgical, family and social history reviewed and updated as indicated. Interim medical history since our last visit reviewed. Allergies and medications reviewed and updated.  Review of Systems  Constitutional: Negative.   HENT: Positive for congestion, postnasal drip, rhinorrhea, sinus pressure and sore throat. Negative for dental problem, drooling, ear discharge, ear pain, facial swelling, hearing loss, mouth sores, nosebleeds, sneezing, tinnitus, trouble swallowing and voice change.   Respiratory: Positive for cough. Negative for apnea, choking, chest tightness, shortness of breath, wheezing and stridor.   Cardiovascular: Negative.   Psychiatric/Behavioral: Negative.     Per HPI unless specifically indicated above     Objective:    BP 166/71 mmHg  Pulse 75   Temp(Src) 97.7 F (36.5 C)  Wt 380 lb (172.367 kg)  SpO2 96%  LMP  (LMP Unknown)  Wt Readings from Last 3 Encounters:  08/02/15 380 lb (172.367 kg)  06/19/15 346 lb 6.4 oz (157.126 kg)  04/04/15 368 lb (166.924 kg)    Physical Exam  Constitutional: She is oriented to person, place, and time. She appears well-developed and well-nourished. No distress.  HENT:  Head: Normocephalic and atraumatic.  Right Ear: Hearing, tympanic membrane, external ear and ear canal normal.  Left Ear: Hearing, tympanic membrane, external ear and ear canal normal.  Nose: Mucosal edema and rhinorrhea present. Right sinus exhibits no maxillary sinus tenderness and no frontal sinus tenderness. Left sinus exhibits no maxillary sinus tenderness and no frontal sinus tenderness.  Mouth/Throat: Uvula is midline, oropharynx is clear and moist and mucous membranes are normal. No oropharyngeal exudate.  Eyes: Conjunctivae, EOM and lids are normal. Pupils are equal, round, and reactive to light. Right eye exhibits no discharge. Left eye exhibits no discharge. No scleral icterus.  Neck: Normal range of motion. Neck supple. No JVD present. No tracheal deviation present. No thyromegaly present.  Cardiovascular: Normal rate, regular rhythm, normal heart sounds and intact distal pulses.  Exam reveals no gallop and no friction rub.   No murmur heard. Pulmonary/Chest: Effort normal and breath sounds normal. No stridor. No respiratory distress. She has no wheezes. She has no rales. She exhibits no tenderness.  Musculoskeletal: Normal range of motion.  Lymphadenopathy:    She has cervical adenopathy.  Neurological: She is alert and oriented to person, place, and time.  Skin:  Skin is intact. No rash noted. She is not diaphoretic.  Psychiatric: She has a normal mood and affect. Her speech is normal and behavior is normal. Judgment and thought content normal. Cognition and memory are normal.  Nursing note and vitals  reviewed.   Results for orders placed or performed in visit on 02/22/15  Lipid Panel Piccolo, [employer]  Result Value Ref Range   Cholesterol Piccolo, Waived CANCELED mg/dL   HDL Chol Piccolo, Waived CANCELED    Triglycerides Piccolo,Waived CANCELED   Uric acid  Result Value Ref Range   Uric Acid 6.3 2.5 - 7.1 mg/dL  Comprehensive metabolic panel  Result Value Ref Range   Glucose 97 65 - 99 mg/dL   BUN 15 8 - 27 mg/dL   Creatinine, Ser 1.610.81 0.57 - 1.00 mg/dL   GFR calc non Af Amer 72 >59 mL/min/1.73   GFR calc Af Amer 83 >59 mL/min/1.73   BUN/Creatinine Ratio 19 11 - 26   Sodium 141 134 - 144 mmol/L   Potassium 4.1 3.5 - 5.2 mmol/L   Chloride 103 97 - 108 mmol/L   CO2 21 18 - 29 mmol/L   Calcium 9.9 8.7 - 10.3 mg/dL   Total Protein 6.6 6.0 - 8.5 g/dL   Albumin 4.2 3.5 - 4.8 g/dL   Globulin, Total 2.4 1.5 - 4.5 g/dL   Albumin/Globulin Ratio 1.8 1.1 - 2.5   Bilirubin Total 0.5 0.0 - 1.2 mg/dL   Alkaline Phosphatase 75 39 - 117 IU/L   AST 15 0 - 40 IU/L   ALT 16 0 - 32 IU/L  Lipid Panel w/o Chol/HDL Ratio  Result Value Ref Range   Cholesterol, Total 225 (H) 100 - 199 mg/dL   Triglycerides 90 0 - 149 mg/dL   HDL 97 >09>39 mg/dL   VLDL Cholesterol Cal 18 5 - 40 mg/dL   LDL Calculated 604110 (H) 0 - 99 mg/dL  Specimen status report  Result Value Ref Range   specimen status report Comment       Assessment & Plan:   Problem List Items Addressed This Visit      Cardiovascular and Mediastinum   Essential hypertension, benign    Did not take her medicine today. Will take it and [facility]       Other Visit Diagnoses    Nasal congestion    -  Primary    Will treat with burst of prednisone prior to her biopsy. Tussionex and tessalon perles for comfort. Call if not getting better or getting worse.         Follow up plan: Return if symptoms worsen or fail to improve.

## 2015-08-02 NOTE — Addendum Note (Signed)
Addended by: Dorcas CarrowJOHNSON, MEGAN P on: 08/02/2015 11:16 AM   Modules accepted: Kipp BroodSmartSet

## 2015-08-02 NOTE — Assessment & Plan Note (Signed)
Did not take her medicine today. Will take it and [facility]

## 2015-08-07 ENCOUNTER — Ambulatory Visit
Admission: RE | Admit: 2015-08-07 | Discharge: 2015-08-07 | Disposition: A | Discharge status: Home / Self Care | Payer: BC Managed Care – PPO | Source: Ambulatory Visit | Attending: Unknown Physician Specialty | Admitting: Unknown Physician Specialty

## 2015-08-07 DIAGNOSIS — E041 Nontoxic single thyroid nodule: Secondary | ICD-10-CM | POA: Diagnosis not present

## 2015-08-07 HISTORY — DX: Pneumonia, unspecified organism: J18.9

## 2015-08-07 NOTE — Discharge Instructions (Signed)

## 2015-08-07 NOTE — Procedures (Signed)
Technically successful US guided biopsy of dominant left sided thyroid nodule.   EBL: None No immediate complications.   Katherina RightJay Syeda Prickett, MD Pager #: [phone]

## 2015-08-09 ENCOUNTER — Other Ambulatory Visit: Payer: Self-pay | Admitting: Unknown Physician Specialty

## 2015-08-09 NOTE — Telephone Encounter (Signed)
Your patient 

## 2015-08-23 ENCOUNTER — Encounter: Payer: Self-pay | Admitting: Unknown Physician Specialty

## 2015-08-23 ENCOUNTER — Ambulatory Visit (INDEPENDENT_AMBULATORY_CARE_PROVIDER_SITE_OTHER): Payer: BC Managed Care – PPO | Admitting: Unknown Physician Specialty

## 2015-08-23 VITALS — BP 118/71 | HR 66 | Temp 98.7°F | Wt 383.4 lb

## 2015-08-23 DIAGNOSIS — I10 Essential (primary) hypertension: Secondary | ICD-10-CM | POA: Diagnosis not present

## 2015-08-23 DIAGNOSIS — J45909 Unspecified asthma, uncomplicated: Secondary | ICD-10-CM | POA: Insufficient documentation

## 2015-08-23 DIAGNOSIS — J45901 Unspecified asthma with (acute) exacerbation: Secondary | ICD-10-CM | POA: Insufficient documentation

## 2015-08-23 DIAGNOSIS — J4540 Moderate persistent asthma, uncomplicated: Secondary | ICD-10-CM | POA: Diagnosis not present

## 2015-08-23 DIAGNOSIS — R059 Cough, unspecified: Secondary | ICD-10-CM

## 2015-08-23 DIAGNOSIS — R05 Cough: Secondary | ICD-10-CM

## 2015-08-23 LAB — VERITOR FLU A/B WAIVED
INFLUENZA A: NEGATIVE
Influenza B: NEGATIVE

## 2015-08-23 MED ORDER — PREDNISONE 20 MG PO TABS: 40.0000 mg | ORAL_TABLET | Freq: Every day | ORAL | Status: DC

## 2015-08-23 MED ORDER — CLARITHROMYCIN 500 MG PO TABS: 500.0000 mg | ORAL_TABLET | Freq: Two times a day (BID) | ORAL | Status: DC

## 2015-08-23 MED ORDER — FLUTICASONE-SALMETEROL 250-50 MCG/DOSE IN AEPB: 1.0000 | INHALATION_SPRAY | Freq: Two times a day (BID) | RESPIRATORY_TRACT | Status: DC

## 2015-08-23 NOTE — Assessment & Plan Note (Signed)
Stable, continue present medications.   

## 2015-08-23 NOTE — Assessment & Plan Note (Addendum)
Patient wants to start preventative med. Start Advair 250-50. Also start prednisone 20mg .

## 2015-08-23 NOTE — Progress Notes (Signed)
BP 118/71 mmHg  Pulse 66  Temp(Src) 98.7 F (37.1 C)  Wt 383 lb 6.4 oz (173.909 kg)  SpO2 97%  LMP  (LMP Unknown)   Subjective:    Patient ID: [name], female    DOB: 06/29/1940, 75 y.o.   MRN: [id number]  HPI: [name] is a 75 y.o. female  Chief Complaint  Patient presents with  . URI    pt states she has had a cough, green phlegm, watery eyes, nasal congestion, no appetite, chest congestion, and red eyes. States symptoms started last Tuesday.   . Hypertension   URI  This is a recurrent problem. The current episode started in the past 7 days. The problem has been gradually worsening. There has been no fever. Associated symptoms include congestion, coughing, headaches, a plugged ear sensation, rhinorrhea, sneezing, a sore throat, swollen glands and wheezing. Pertinent negatives include no chest pain, rash or sinus pain. Associated symptoms comments: Yellow eye discharge. Loss of appetite.  . She has tried increased fluids, inhaler use, antihistamine and decongestant (. Has also tried Defend Cold and Cough. Saline rinse works to relieve for 45 minutes) for the symptoms. The treatment provided no relief.    Hypertension Using medications without difficulty Average home BPs: occasionally, runs 150-160s/80s   No problems or lightheadedness No chest pain with exertion or shortness of breath No Edema: patient has chronic lymphedema   Relevant past medical, surgical, family and social history reviewed and updated as indicated. Interim medical history since our last visit reviewed. Allergies and medications reviewed and updated.  Review of Systems  HENT: Positive for congestion, rhinorrhea, sneezing and sore throat.   Respiratory: Positive for cough and wheezing.   Cardiovascular: Negative for chest pain.  Skin: Negative for rash.  Neurological: Positive for headaches.    Per HPI unless specifically indicated above     Objective:    BP 118/71 mmHg  Pulse 66  Temp(Src)  98.7 F (37.1 C)  Wt 383 lb 6.4 oz (173.909 kg)  SpO2 97%  LMP  (LMP Unknown)  Wt Readings from Last 3 Encounters:  08/23/15 383 lb 6.4 oz (173.909 kg)  08/02/15 380 lb (172.367 kg)  06/19/15 346 lb 6.4 oz (157.126 kg)    Physical Exam  Constitutional: She is oriented to person, place, and time. She appears well-developed and well-nourished. No distress.  HENT:  Head: Normocephalic and atraumatic.  Right Ear: Tympanic membrane and ear canal normal.  Left Ear: Tympanic membrane and ear canal normal.  Nose: Rhinorrhea present. Right sinus exhibits no maxillary sinus tenderness and no frontal sinus tenderness. Left sinus exhibits no maxillary sinus tenderness and no frontal sinus tenderness.  Mouth/Throat: Mucous membranes are normal. Posterior oropharyngeal erythema present.  Eyes: Conjunctivae and lids are normal. Right eye exhibits no discharge. Left eye exhibits no discharge. No scleral icterus.  Cardiovascular: Normal rate and regular rhythm.   Pulmonary/Chest: Effort normal and breath sounds normal. No respiratory distress.  Abdominal: Normal appearance. There is no splenomegaly or hepatomegaly.  Musculoskeletal: Normal range of motion.  Lymphadenopathy:    She has cervical adenopathy.  Neurological: She is alert and oriented to person, place, and time.  Skin: Skin is intact. No rash noted. No pallor.  Psychiatric: She has a normal mood and affect. Her behavior is normal. Judgment and thought content normal.        Assessment & Plan:   Problem List Items Addressed This Visit      Unprioritized   Asthma  Patient wants to start preventative med. Start Advair 250-50. Also start prednisone .      Relevant Medications   predniSONE (DELTASONE) 20 MG tablet   Fluticasone-Salmeterol (ADVAIR DISKUS) 250-50 MCG/DOSE AEPB   Essential hypertension, benign    Stable, continue present medications.         Other Visit Diagnoses    Cough    -  Primary    Relevant Orders     Influenza A & B (STAT) (Completed)        Follow up plan: Return in about 4 weeks (around 09/20/2015) for needs spirometry.

## 2015-08-31 ENCOUNTER — Telehealth: Payer: Self-pay

## 2015-08-31 LAB — CYTOLOGY - NON PAP

## 2015-08-31 MED ORDER — LORAZEPAM 1 MG PO TABS: 1.0000 mg | ORAL_TABLET | Freq: Every day | ORAL | Status: DC | PRN

## 2015-08-31 NOTE — Telephone Encounter (Signed)
Patient states she will probably send someone to pick it up for her.

## 2015-08-31 NOTE — Telephone Encounter (Signed)
Patient called in crying because her husband is in the hospital and she stated she is not doing good. Patient stated she is a basket case and asked if something could be called into her pharmacy so she could hold it together to be able to see her husband in the hospital. I told patient that [facility] MaxwellCheryl was not in the office today and she asked if Dr. Dossie Arbourrissman would be willing to write her something. I told the patient that I would send the message [facility] and Dr. Dossie Arbourrissman. Patient also wants to know if she should take her wellbutrin.

## 2015-09-01 ENCOUNTER — Other Ambulatory Visit: Payer: Self-pay | Admitting: Unknown Physician Specialty

## 2015-09-01 ENCOUNTER — Encounter: Payer: Self-pay | Admitting: Unknown Physician Specialty

## 2015-09-08 ENCOUNTER — Telehealth: Payer: Self-pay

## 2015-09-08 MED ORDER — AZITHROMYCIN 250 MG PO TABS: ORAL_TABLET | ORAL | Status: DC

## 2015-09-08 MED ORDER — HYDROCOD POLST-CPM POLST ER 10-8 MG/5ML PO SUER: 5.0000 mL | Freq: Two times a day (BID) | ORAL | Status: DC | PRN

## 2015-09-08 NOTE — Telephone Encounter (Signed)
Patient called and stated that she has taken all of the medication that [facility]yl gave her the other day. But patient states she is still coughing and her husbands funeral is tomorrow. Patient wants to know if another round of antibiotics and cough syrup can be sent in.

## 2015-09-08 NOTE — Telephone Encounter (Signed)
Called and let patient know that antibiotic was sent to her pharmacy and that she had to stop by to pick up her tussinex rx and take it to the pharmacy to be filled. Patient stated that her daughter, Carole CivilKelly Compton, would pick up her prescription.

## 2015-09-19 ENCOUNTER — Encounter: Payer: Self-pay | Admitting: Unknown Physician Specialty

## 2015-09-19 ENCOUNTER — Ambulatory Visit (INDEPENDENT_AMBULATORY_CARE_PROVIDER_SITE_OTHER): Payer: BC Managed Care – PPO | Admitting: Unknown Physician Specialty

## 2015-09-19 VITALS — BP 122/66 | HR 80 | Temp 97.9°F | Ht 63.2 in | Wt 379.2 lb

## 2015-09-19 DIAGNOSIS — L509 Urticaria, unspecified: Secondary | ICD-10-CM | POA: Diagnosis not present

## 2015-09-19 DIAGNOSIS — L03119 Cellulitis of unspecified part of limb: Secondary | ICD-10-CM

## 2015-09-19 DIAGNOSIS — I890 Lymphedema, not elsewhere classified: Secondary | ICD-10-CM | POA: Diagnosis not present

## 2015-09-19 DIAGNOSIS — L0390 Cellulitis, unspecified: Secondary | ICD-10-CM | POA: Insufficient documentation

## 2015-09-19 MED ORDER — TRIAMCINOLONE ACETONIDE 0.1 % EX CREA: 1.0000 "application " | TOPICAL_CREAM | Freq: Two times a day (BID) | CUTANEOUS | Status: DC

## 2015-09-19 MED ORDER — CLARITHROMYCIN 500 MG PO TABS: 500.0000 mg | ORAL_TABLET | Freq: Two times a day (BID) | ORAL | Status: DC

## 2015-09-19 NOTE — Progress Notes (Signed)
BP 122/66 mmHg  Pulse 80  Temp(Src) 97.9 F (36.6 C)  Ht 5' 3.2" (1.605 m)  Wt 379 lb 3.2 oz (172.004 kg)  BMI 66.77 kg/m2  SpO2 95%  LMP  (LMP Unknown)   Subjective:    Patient ID: [name], female    DOB: 07/27/1940, 75 y.o.   MRN: [id number]  HPI: [name] is a 75 y.o. female  Chief Complaint  Patient presents with  . Back itching    pt states her back has been itching since yesterday, pt states she is worried about cellulitis because she has lymphedema   Pt is here with her daughter and states that her back has been itching.  She took Benadryl but was up all night with her back itching and burnign.    Recently lost her husband.    States she noted lower legs with redness.  She has significant lymphedema and hasn't gotten treatment due to husband's illness.  She has gotten Biaxin in the past for her legs.    Relevant past medical, surgical, family and social history reviewed and updated as indicated. Interim medical history since our last visit reviewed. Allergies and medications reviewed and updated.  Review of Systems  Per HPI unless specifically indicated above     Objective:    BP 122/66 mmHg  Pulse 80  Temp(Src) 97.9 F (36.6 C)  Ht 5' 3.2" (1.605 m)  Wt 379 lb 3.2 oz (172.004 kg)  BMI 66.77 kg/m2  SpO2 95%  LMP  (LMP Unknown)  Wt Readings from Last 3 Encounters:  09/19/15 379 lb 3.2 oz (172.004 kg)  08/23/15 383 lb 6.4 oz (173.909 kg)  08/02/15 380 lb (172.367 kg)    Physical Exam  Constitutional: She is oriented to person, place, and time. She appears well-developed and well-nourished. No distress.  HENT:  Head: Normocephalic and atraumatic.  Eyes: Conjunctivae and lids are normal. Right eye exhibits no discharge. Left eye exhibits no discharge. No scleral icterus.  Neck: Normal range of motion. Neck supple. No JVD present. Carotid bruit is not present.  Cardiovascular: Normal rate, regular rhythm and normal heart sounds.   Pulmonary/Chest: Effort  normal and breath sounds normal.  Abdominal: Normal appearance. There is no splenomegaly or hepatomegaly.  Musculoskeletal: Normal range of motion.  Neurological: She is alert and oriented to person, place, and time.  Skin: Skin is warm, dry and intact. No rash noted. No pallor.  Lower leg erythema and warmth.    Psychiatric: She has a normal mood and affect. Her behavior is normal. Judgment and thought content normal.    Results for orders placed or performed in visit on 08/23/15  Influenza A & B (STAT)  Result Value Ref Range   Influenza A Negative Negative   Influenza B Negative Negative      Assessment & Plan:   Problem List Items Addressed This Visit      Unprioritized   Cellulitis - Primary    Rx for Biaxin as she she took it in the past.  Wear stockings and keep legs elevated      Lymphedema    Due to get treatments.  Appt with PT for wrapping      Relevant Orders   Ambulatory referral to Physical Therapy    Other Visit Diagnoses    Urticaria        No rashes noted.  I think a lot is related to nerves.  Will take the Lorazepam given by Dr. Dossie Arbourrissman  Follow up plan: Return if symptoms worsen or fail to improve.

## 2015-09-19 NOTE — Assessment & Plan Note (Addendum)
Due to get treatments.  Appt with PT for wrapping

## 2015-09-19 NOTE — Assessment & Plan Note (Signed)
Rx for Biaxin as she she took it in the past.  Wear stockings and keep legs elevated

## 2015-09-20 ENCOUNTER — Ambulatory Visit: Payer: BC Managed Care – PPO | Admitting: Unknown Physician Specialty

## 2015-09-27 ENCOUNTER — Other Ambulatory Visit: Payer: Self-pay | Admitting: Unknown Physician Specialty

## 2015-10-05 ENCOUNTER — Other Ambulatory Visit: Payer: Self-pay | Admitting: Unknown Physician Specialty

## 2015-10-11 ENCOUNTER — Ambulatory Visit (INDEPENDENT_AMBULATORY_CARE_PROVIDER_SITE_OTHER): Payer: BC Managed Care – PPO | Admitting: Unknown Physician Specialty

## 2015-10-11 ENCOUNTER — Encounter: Payer: Self-pay | Admitting: Unknown Physician Specialty

## 2015-10-11 VITALS — BP 140/81 | HR 56 | Temp 97.7°F | Ht 63.0 in | Wt 381.6 lb

## 2015-10-11 DIAGNOSIS — I890 Lymphedema, not elsewhere classified: Secondary | ICD-10-CM | POA: Diagnosis not present

## 2015-10-11 DIAGNOSIS — M75101 Unspecified rotator cuff tear or rupture of right shoulder, not specified as traumatic: Secondary | ICD-10-CM

## 2015-10-11 DIAGNOSIS — L821 Other seborrheic keratosis: Secondary | ICD-10-CM | POA: Diagnosis not present

## 2015-10-11 DIAGNOSIS — M7541 Impingement syndrome of right shoulder: Secondary | ICD-10-CM

## 2015-10-11 NOTE — Progress Notes (Signed)
BP 140/81 mmHg  Pulse 56  Temp(Src) 97.7 F (36.5 C)  Ht 5\' 3"  (1.6 m)  Wt 381 lb 9.6 oz (173.093 kg)  BMI 67.61 kg/m2  SpO2 99%  LMP  (LMP Unknown)   Subjective:    Patient ID: [name], female    DOB: 01/27/1941, 75 y.o.   MRN: [id number]  HPI: Barbara LevyBetty Notarianni is a 75 y.o. female  Chief Complaint  Patient presents with  . Wound Check    pt states she has a wound on the back of right leg that came up Thursday  . Arm Pain    pt states her right arm has been bothering her for a few weeks now  . Nevus    pt states she has a mole she would like looked at on right cheek   Back of right knee she has some redness and irritation that has been treated with silver (dressing pads).  It is now better but wants it checked out.   Arm pain Right arm and shoulder pain.  Hurts to reach.  Thinks it's related to trying to get in and out of the car in a different way.    Seborrheic keratosis Right cheek for about 6 months.     Relevant past medical, surgical, family and social history reviewed and updated as indicated. Interim medical history since our last visit reviewed. Allergies and medications reviewed and updated.  Review of Systems  Per HPI unless specifically indicated above     Objective:    BP 140/81 mmHg  Pulse 56  Temp(Src) 97.7 F (36.5 C)  Ht 5\' 3"  (1.6 m)  Wt 381 lb 9.6 oz (173.093 kg)  BMI 67.61 kg/m2  SpO2 99%  LMP  (LMP Unknown)  Wt Readings from Last 3 Encounters:  10/11/15 381 lb 9.6 oz (173.093 kg)  09/19/15 379 lb 3.2 oz (172.004 kg)  08/23/15 383 lb 6.4 oz (173.909 kg)    Physical Exam  Constitutional: She is oriented to person, place, and time. She appears well-developed and well-nourished. No distress.  HENT:  Head: Normocephalic and atraumatic.  Eyes: Conjunctivae and lids are normal. Right eye exhibits no discharge. Left eye exhibits no discharge. No scleral icterus.  Cardiovascular: Normal rate.   Pulmonary/Chest: Effort normal.  Abdominal:  Normal appearance. There is no splenomegaly or hepatomegaly.  Musculoskeletal:       Right shoulder: She exhibits decreased range of motion, tenderness, pain and decreased strength. She exhibits no bony tenderness, no swelling, no effusion and no crepitus.  Neurological: She is alert and oriented to person, place, and time.  Skin: Skin is warm, dry and intact. No rash noted. No pallor.  Back on right knee without any breakdown but areas rubbing together due to significant lymphedema  Seborrheic keratosis right cheek  Psychiatric: She has a normal mood and affect. Her behavior is normal. Judgment and thought content normal.    Results for orders placed or performed in visit on 08/23/15  Influenza A & B (STAT)  Result Value Ref Range   Influenza A Negative Negative   Influenza B Negative Negative      Assessment & Plan:   Problem List Items Addressed This Visit      Unprioritized   Lymphedema    Areas where skin rubs together.  Will monitor and she will call wound clinic to name the dressing they used.        Rotator cuff impingement syndrome of right shoulder    STEROID  INJECTION  Procedure: Shoulder Intraarticular Steroid Injection Consent: DEL Risks, benefits, and alternative treatments discussed and all questions were answered.  Patient elected to proceed and verbal consent obtained.  Description: Area prepped and draped using   semi-sterile technique. Using a posterolateral approach a mixture of 4cc 0.5% marcaine & 1 cc Kenalog 40 injected in shoulder joint.  A bandage was then placed over the injection site. Complications:  none          Seborrheic keratoses - Primary    She will monitor and consider cryo          Follow up plan: Return for regular appt in June.

## 2015-10-11 NOTE — Assessment & Plan Note (Signed)
STEROID INJECTION  Procedure: Shoulder Intraarticular Steroid Injection Consent: DEL Risks, benefits, and alternative treatments discussed and all questions were answered.  Patient elected to proceed and verbal consent obtained.  Description: Area prepped and draped using   semi-sterile technique. Using a posterolateral approach a mixture of 4cc 0.5% marcaine & 1 cc Kenalog 40 injected in shoulder joint.  A bandage was then placed over the injection site. Complications:  none      

## 2015-10-11 NOTE — Assessment & Plan Note (Signed)
She will monitor and consider cryo

## 2015-10-11 NOTE — Assessment & Plan Note (Signed)
Areas where skin rubs together.  Will monitor and she will call wound clinic to name the dressing they used.

## 2015-10-13 ENCOUNTER — Telehealth: Payer: Self-pay | Admitting: Unknown Physician Specialty

## 2015-10-13 MED ORDER — CLARITHROMYCIN 500 MG PO TABS: 500.0000 mg | ORAL_TABLET | Freq: Two times a day (BID) | ORAL | Status: DC

## 2015-10-13 NOTE — Telephone Encounter (Signed)
Pt called and stated yesterday she woke up sick on the stomach with a temp is 100 and she is afraid due to lymphedema she may have an infection.

## 2015-10-13 NOTE — Telephone Encounter (Signed)
Called and let patient know about RX. Patient asked what she could take for a fever, so I placed the patient on the hold, asked Elnita MaxwellCheryl, and Elnita MaxwellCheryl recommended tylenol. Elnita MaxwellCheryl also asked for me to tell the patient that if she is not getting better, that she should go to the ER. I gave the patient all of this information.

## 2015-10-13 NOTE — Telephone Encounter (Signed)
Routing to provider  

## 2015-10-13 NOTE — Telephone Encounter (Addendum)
OK I'll give her Biaxin which she typically takes for this

## 2015-10-30 ENCOUNTER — Ambulatory Visit: Payer: BC Managed Care – PPO | Attending: Unknown Physician Specialty | Admitting: Occupational Therapy

## 2015-10-30 DIAGNOSIS — I890 Lymphedema, not elsewhere classified: Secondary | ICD-10-CM | POA: Diagnosis not present

## 2015-10-30 NOTE — Therapy (Signed)
[place] [facility] MAIN [facility] SERVICES [address] [place], [place], [address] Phone: [phone]   Fax:  [phone]  Occupational Therapy Evaluation  Patient Details  Name: [name] MRN: [id number] Date of Birth: 11/25/1940 No Data Recorded  Encounter Date: 10/30/2015      OT End of Session - 10/30/15 1625    Visit Number 1   Number of Visits 36   Date for OT Re-Evaluation 01/28/16   OT Start Time 0101   OT Stop Time 0205   OT Time Calculation (min) 64 min   Equipment Utilized During Treatment LE Self Care Workbook   Activity Tolerance Patient tolerated treatment well;No increased pain   Behavior During Therapy [facility] for tasks assessed/performed      Past Medical History  Diagnosis Date  . Allergy   . Neuromuscular disorder (HCC)   . Arthritis   . Asthma   . GERD (gastroesophageal reflux disease)   . Chronic fatigue syndrome   . Hypertension   . Pneumonia     Past Surgical History  Procedure Laterality Date  . Abdominal hysterectomy  1974  . Cholecystectomy  2005  . Biopsy thyroid      There were no vitals filed for this visit.      Subjective Assessment - 10/30/15 1628    Subjective  Pt is referred for Occupational Therapy evaluation and treatment of BLE. Pt completed successful courses of Complete Decongestive Therapy (CDT) in the past at [facility] and at [facility]. Pt tells me that existing compression garments are worn out and she is unable to put them on and take them off  without help.    Patient is accompained by: Interpreter   Pertinent History Long standing, painful leg swelling; positive hx for lipidema onclear;    Limitations Difficulty walking, standing, and with functional transfers. Leg pain. shoulder pain. Body habitus limits ability to reach feet and lower legs needed to perform lower body basic ADLs. Hx lymphorrhea R calf by report. Hx non healling wounds   L calf by report.   Patient Stated Goals reduce swelling and keep  it from getting worse   Currently in Pain? Yes   Pain Score --  leg pain not rated             LYMPHEDEMA/ONCOLOGY QUESTIONNAIRE - 10/30/15 1649    What other symptoms do you have   Are you Having Heaviness or Tightness Yes   Are you having Pain Yes   Are you having pitting edema No   Is it Hard or Difficult finding clothes that fit Yes   Do you have infections Yes   Comments Skin is reddened and very tight below the knees with mild medial knee lobules. Thighs are  fatty and doughy. Peau d' orange boted at anterior legs with tenderness and non pitting, hard fibrosis at abkles.   Stemmer Sign Yes   Other Symptoms excess deposition of fatty fibrosis belw waist in keeping w/ llipidema   Lymphedema Stage   Stage STAGE 2 SPONTANEOUSLY IRREVERSIBLE   Lymphedema Assessments   Lymphedema Assessments Lower extremities   Right Lower Extremity Lymphedema   Other BLE Comparative limb volumes to be assessed first Rx visit                OT Treatments/Exercises (OP) - 10/30/15 0001    ADLs   Overall ADLs Unable to reach feet and distal legs to bath and dress lower body. uses adapted equipment and has accessible home.  Requests advice re shower chair    LB Dressing unable to don shoes and socks. Unable to don traditional elastic compression garments and devices   Toileting adapted toilet   Bathing accessible shower. needs seat   Functional Mobility power wc x 2 for community and work mobility. 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 40 hrs / week . reliant on power mobility at work   Leisure family and friends. Husband recently deceased   ADL Education Given Yes   Manual Therapy   Manual Therapy Edema management               OT Education - 10/30/15 1636    Education provided Yes   Education Details Provided Pt/caregiver skilled education and ADL training throughout  visit for lymphedema etiology, progression, and treatment including Intensive and Management Phase Complete Decongestive Therapy (CDT)  Discussed lymphedema precautions, cellulitis risk, and all CDT and LE self-care components, including compression wrapping/ garments & devices, lymphatic pumping ther ex, simple self-MLD, and skin care. Provided printed Lymphedema Workbook for reference.   Person(s) Educated Patient;Child(ren)   Methods Explanation;Demonstration;Tactile cues;Verbal cues;Handout   Comprehension Verbalized understanding;Need further instruction;Tactile cues required;Verbal cues required             OT Long Term Goals - 10/30/15 1621    OT LONG TERM GOAL #1   Title Lymphedema (LE) self-care: Pt able to apply multi layered, gradient compression wraps with Max caregiver assistance within 2 weeks to achieve optimal limb volume reduction.   Baseline dependent    Time 2   Period Weeks   Status New   OT LONG TERM GOAL #2   Title Lymphedema (LE) self-care:  Pt to achieve at least 10% BLE limb volume reductions during Intensive CDT to limit LE progression, decrease infection risk, to reduce pain/ discomfort, and to improve safe ambulation and functional mobility.   Baseline dependent   Time 12   Period Weeks   Status New   OT LONG TERM GOAL #3   Title Pt >/= 85 % compliant with all daily, LE self-care protocols for home program, including simple self-manual lymphatic drainage (MLD), skin care, lymphatic pumping the ex, skin care, and donning/ doffing compression wraps and garments with Maximum caregiver assistance to limit LE progression and further functional decline.     Baseline dependent    Time 12   Period Weeks   Status New   OT LONG TERM GOAL #4   Title Pt to tolerate daily compression wraps, garments and devices in keeping w/ prescribed wear regime within 1 week of issue date to progress and retain clinical and functional gains and to limit LE progression.   Baseline  dependent    Time 12   Period Weeks   Status New   OT LONG TERM GOAL #5   Title During Management Phase CDT Pt to sustain limb volume reductions achieved during Intensive Phase CDT within 5% utilizing LE self-care protocols, appropriate compression garments/ devices, and needed level of caregiver assistance.   Baseline dependent    Time 6   Period Months   Status New               Plan - 10/30/15 1619    Clinical Impression Statement Pt presents with moderate, stage 2, BLE lipo-lymphedema (LE), L>R, secondary to suspected lipedema. Obesity, decreased mobility, recurrent  cellulitis, and dependent positioning are also significant contributing factors. Pt is vague about family history. Fatty fibrosis throughout the legs with "pantaloons" distribution of swelling and fibrosis around ankles is consistent with typical lipedema presentation. Feet are spared. Legs are very tender to palpation and bruise easily by report. Pt reports dramatic exacerbation ~ 11 years ago while on a beach holiday after cellulitis episode. Pt endorses worsening condition over time with successful courses of LE therapy at [facility] and at [facility] in the past.  Chronic, progressive legs swelling, pain/ sensory discomfort, and body habitus typical of lipo-lymphedema limit ambulation and functional mobility, basic and instrumental ADLs performance, participation in leisure, social and community activities, and ability to perform productive activities at home and at work. Without skilled Occupational Therapy for Intensive and Management phase Complete Decongestive Therapy (CDT), worsening condition and further functional decline are expected.    Rehab Potential Good   Clinical Impairments Affecting Rehab Potential TBD based on medical chart review. Awaiting MD notes   OT Frequency 3x / week   OT Duration 12 weeks   OT Treatment/Interventions Self-care/ADL training;DME and/or AE  instruction;Manual lymph drainage;Patient/family education;Compression bandaging;Therapeutic exercises;Therapeutic activities;Manual Therapy   Consulted and Agree with Plan of Care Patient;Family member/caregiver      Patient will benefit from skilled therapeutic intervention in order to improve the following deficits and impairments:  Abnormal gait, Decreased knowledge of use of DME, Decreased skin integrity, Increased edema, Impaired flexibility, Pain, Decreased mobility, Decreased activity tolerance, Decreased endurance, Decreased range of motion, Decreased balance, Decreased knowledge of precautions, Difficulty walking  Visit Diagnosis: Lymphedema, not elsewhere classified - Plan: Ot plan of care cert/re-cert      G-Codes - 2015/11/19 1636    Functional Limitation Self care   Self Care Current Status [phone] 100 percent impaired, limited or restricted   Self Care Goal Status ([id number]) At least 40 percent but less than 60 percent impaired, limited or restricted      Problem List Patient Active Problem List   Diagnosis Date Noted  . Seborrheic keratoses 10/11/2015  . Rotator cuff impingement syndrome of right shoulder 10/11/2015  . Cellulitis 09/19/2015  . Asthma 08/23/2015  . Lymphedema 12/05/2014  . Essential hypertension, benign 12/05/2014    Loel Dubonnet, MS, OTR/L, [facility] 11/19/2015 4:54 PM  [place] [facility] MAIN [facility] SERVICES [address] [place], [place], [address] Phone: [phone]   Fax:  [phone]  Name: Chanika Byland MRN: [id number] Date of Birth: [birth year]

## 2015-10-30 NOTE — Patient Instructions (Signed)

## 2015-10-31 ENCOUNTER — Ambulatory Visit: Payer: BC Managed Care – PPO | Admitting: Unknown Physician Specialty

## 2015-11-06 ENCOUNTER — Ambulatory Visit (INDEPENDENT_AMBULATORY_CARE_PROVIDER_SITE_OTHER): Payer: BC Managed Care – PPO | Admitting: Unknown Physician Specialty

## 2015-11-06 ENCOUNTER — Encounter: Payer: Self-pay | Admitting: Unknown Physician Specialty

## 2015-11-06 VITALS — BP 130/78 | HR 71 | Temp 98.0°F | Wt 368.8 lb

## 2015-11-06 DIAGNOSIS — I10 Essential (primary) hypertension: Secondary | ICD-10-CM | POA: Diagnosis not present

## 2015-11-06 DIAGNOSIS — R5383 Other fatigue: Secondary | ICD-10-CM

## 2015-11-06 DIAGNOSIS — F32A Depression, unspecified: Secondary | ICD-10-CM

## 2015-11-06 DIAGNOSIS — F329 Major depressive disorder, single episode, unspecified: Secondary | ICD-10-CM

## 2015-11-06 DIAGNOSIS — J4540 Moderate persistent asthma, uncomplicated: Secondary | ICD-10-CM | POA: Diagnosis not present

## 2015-11-06 NOTE — Assessment & Plan Note (Signed)
Encouraged to take Wellbutrin twice a day

## 2015-11-06 NOTE — Assessment & Plan Note (Signed)
Stable, continue present medications.   

## 2015-11-06 NOTE — Progress Notes (Signed)
BP 130/78 mmHg  Pulse 71  Temp(Src) 98 F (36.7 C)  Wt 368 lb 12.8 oz (167.287 kg)  SpO2 98%  LMP  (LMP Unknown)   Subjective:    Patient ID: [name], female    DOB: Aug 26, 1940, 75 y.o.   MRN: [id number]  HPI: [name] is a 75 y.o. female  Chief Complaint  Patient presents with  . Hypertension  . Labs Only    pt states she is fasting today for labs   Hypertension Using medications without difficulty Average home BPs Not checking   No problems or lightheadedness No chest pain with exertion or shortness of breath Has significant Lymphedema  Depression Only taking Wellbutrin once a day.  She is having panic attacks.  She is taking the occasional Lorazepam.   Depression screen [facility] 2/9 11/06/2015 12/05/2014  Decreased Interest 2 0  Down, Depressed, Hopeless 1 0  PHQ - 2 Score 3 0  Altered sleeping 2 -  Tired, decreased energy 1 -  Change in appetite 0 -  Feeling bad or failure about yourself  0 -  Trouble concentrating 0 -  Moving slowly or fidgety/restless 0 -  Suicidal thoughts 0 -  PHQ-9 Score 6 -   ASTHMA Symptoms are well controlled: Using medications without problems Night time symptoms: none Wheeze/SOB: none ER visits since last visit: none   Relevant past medical, surgical, family and social history reviewed and updated as indicated. Interim medical history since our last visit reviewed. Allergies and medications reviewed and updated.  Review of Systems  Constitutional: Negative.   HENT: Negative.   Eyes: Negative.   Respiratory: Negative.   Cardiovascular: Negative.   Gastrointestinal: Negative.   Endocrine: Negative.   Genitourinary: Negative.   Musculoskeletal: Negative.   Skin: Negative.   Allergic/Immunologic: Negative.   Neurological: Negative.   Hematological: Negative.     Per HPI unless specifically indicated above     Objective:    BP 130/78 mmHg  Pulse 71  Temp(Src) 98 F (36.7 C)  Wt 368 lb 12.8 oz (167.287 kg)  SpO2 98%   LMP  (LMP Unknown)  Wt Readings from Last 3 Encounters:  11/06/15 368 lb 12.8 oz (167.287 kg)  10/11/15 381 lb 9.6 oz (173.093 kg)  09/19/15 379 lb 3.2 oz (172.004 kg)    Physical Exam  Constitutional: She is oriented to person, place, and time. She appears well-developed and well-nourished. No distress.  HENT:  Head: Normocephalic and atraumatic.  Eyes: Conjunctivae and lids are normal. Right eye exhibits no discharge. Left eye exhibits no discharge. No scleral icterus.  Neck: Normal range of motion. Neck supple. No JVD present. Carotid bruit is not present.  Cardiovascular: Normal rate, regular rhythm and normal heart sounds.   Pulmonary/Chest: Effort normal and breath sounds normal.  Abdominal: Normal appearance. There is no splenomegaly or hepatomegaly.  Musculoskeletal: Normal range of motion.  Neurological: She is alert and oriented to person, place, and time.  Skin: Skin is warm, dry and intact. No rash noted. No pallor.  Psychiatric: She has a normal mood and affect. Her behavior is normal. Judgment and thought content normal.    Results for orders placed or performed in visit on 08/23/15  Influenza A & B (STAT)  Result Value Ref Range   Influenza A Negative Negative   Influenza B Negative Negative      Assessment & Plan:   Problem List Items Addressed This Visit      Unprioritized   Asthma  Stable, continue present medications.        Depression    Encouraged to take Wellbutrin twice a day      Essential hypertension, benign - Primary    Stable, continue present medications.        Relevant Orders   Comprehensive metabolic panel   Lipid Panel w/o Chol/HDL Ratio    Other Visit Diagnoses    Fatigue due to depression        Probable grief reaction    Relevant Orders    TSH        Follow up plan: No Follow-up on file.

## 2015-11-07 ENCOUNTER — Encounter: Payer: Self-pay | Admitting: Unknown Physician Specialty

## 2015-11-07 LAB — COMPREHENSIVE METABOLIC PANEL
ALBUMIN: 3.8 g/dL (ref 3.5–4.8)
ALK PHOS: 72 IU/L (ref 39–117)
ALT: 21 IU/L (ref 0–32)
AST: 15 IU/L (ref 0–40)
Albumin/Globulin Ratio: 1.5 (ref 1.2–2.2)
BILIRUBIN TOTAL: 0.4 mg/dL (ref 0.0–1.2)
BUN/Creatinine Ratio: 17 (ref 12–28)
BUN: 15 mg/dL (ref 8–27)
CHLORIDE: 100 mmol/L (ref 96–106)
CO2: 23 mmol/L (ref 18–29)
CREATININE: 0.87 mg/dL (ref 0.57–1.00)
Calcium: 10.3 mg/dL (ref 8.7–10.3)
GFR calc non Af Amer: 65 mL/min/{1.73_m2} (ref >59)
GFR, EST AFRICAN AMERICAN: 75 mL/min/{1.73_m2} (ref >59)
GLUCOSE: 86 mg/dL (ref 65–99)
Globulin, Total: 2.6 g/dL (ref 1.5–4.5)
Potassium: 5.2 mmol/L (ref 3.5–5.2)
Sodium: 141 mmol/L (ref 134–144)
Total Protein: 6.4 g/dL (ref 6.0–8.5)

## 2015-11-07 LAB — LIPID PANEL W/O CHOL/HDL RATIO
CHOLESTEROL TOTAL: 197 mg/dL (ref 100–199)
HDL: 87 mg/dL (ref >39)
LDL CALC: 91 mg/dL (ref 0–99)
Triglycerides: 96 mg/dL (ref 0–149)
VLDL CHOLESTEROL CAL: 19 mg/dL (ref 5–40)

## 2015-11-07 LAB — TSH: TSH: 2.09 u[IU]/mL (ref 0.450–4.500)

## 2015-11-07 NOTE — Progress Notes (Signed)
Quick Note:  Normal labs. Patient notified by letter. ______ 

## 2015-11-10 ENCOUNTER — Encounter: Payer: Self-pay | Admitting: Unknown Physician Specialty

## 2015-11-10 ENCOUNTER — Ambulatory Visit (INDEPENDENT_AMBULATORY_CARE_PROVIDER_SITE_OTHER): Payer: BC Managed Care – PPO | Admitting: Unknown Physician Specialty

## 2015-11-10 VITALS — BP 139/74 | HR 69 | Temp 97.5°F | Ht 63.0 in | Wt 368.0 lb

## 2015-11-10 DIAGNOSIS — I890 Lymphedema, not elsewhere classified: Secondary | ICD-10-CM | POA: Diagnosis not present

## 2015-11-10 DIAGNOSIS — M7541 Impingement syndrome of right shoulder: Secondary | ICD-10-CM

## 2015-11-10 DIAGNOSIS — Z7409 Other reduced mobility: Secondary | ICD-10-CM | POA: Insufficient documentation

## 2015-11-10 DIAGNOSIS — M75101 Unspecified rotator cuff tear or rupture of right shoulder, not specified as traumatic: Secondary | ICD-10-CM | POA: Diagnosis not present

## 2015-11-10 NOTE — Progress Notes (Signed)
BP 139/74 mmHg  Pulse 69  Temp(Src) 97.5 F (36.4 C)  Ht 5\' 3"  (1.6 m)  Wt 368 lb (166.924 kg)  BMI 65.20 kg/m2  SpO2 99%  LMP  (LMP Unknown)   Subjective:    Patient ID: [name], female    DOB: 05/12/1941, 75 y.o.   MRN: [id number]  HPI: [name] is a 75 y.o. female  Chief Complaint  Patient presents with  . paperwork    pt is here to get hover round paperwork filled out    Pt is here with request of a Hover round due to problems with mobility.  She states that she had a power chair but broke but needs one that is more durable.  She is only able to stand about 2 minutes while holding onto something and would be unable to work or do tasks around the house with just a cane or a walker. She has had no falls but occasionally off balance.  She is not confined to bed or a wheelchair as able to transfer and stand for short periods of time.  She cannot use a manual wheelchair due to shoulder rotator cuff problems.  She needs a vehicle that she can up to a table a table and work and would be unable to turn some tight corners at home with a scooter.  She is able to perform ADLs if able to move from room to room.  She is mentally intact and able to physically manage power controls.  Because she has had one in the past, she has demonstrated willingness and motivation to use a power chair. She gets regular PT and OT evaluations  Ms Rosalia HammersRay is a very active woman who continues with a robust social and work life that can be maintained with a power chair that meets her durability needs.    Relevant past medical, surgical, family and social history reviewed and updated as indicated. Interim medical history since our last visit reviewed. Allergies and medications reviewed and updated.  Review of Systems  Per HPI unless specifically indicated above     Objective:    BP 139/74 mmHg  Pulse 69  Temp(Src) 97.5 F (36.4 C)  Ht 5\' 3"  (1.6 m)  Wt 368 lb (166.924 kg)  BMI 65.20 kg/m2  SpO2 99%  LMP   (LMP Unknown)  Wt Readings from Last 3 Encounters:  11/10/15 368 lb (166.924 kg)  11/06/15 368 lb 12.8 oz (167.287 kg)  10/11/15 381 lb 9.6 oz (173.093 kg)    Physical Exam  Constitutional: She is oriented to person, place, and time. She appears well-developed and well-nourished. No distress.  Here ambulating with a walker with difficulty  HENT:  Head: Normocephalic and atraumatic.  Eyes: Conjunctivae and lids are normal. Right eye exhibits no discharge. Left eye exhibits no discharge. No scleral icterus.  Cardiovascular: Normal rate.   Pulmonary/Chest: Effort normal.  Abdominal: Normal appearance. There is no splenomegaly or hepatomegaly.  Musculoskeletal: Normal range of motion.  Neurological: She is alert and oriented to person, place, and time.  Skin: Skin is intact. No rash noted. No pallor.  Psychiatric: She has a normal mood and affect. Her behavior is normal. Judgment and thought content normal.    Results for orders placed or performed in visit on 11/06/15  TSH  Result Value Ref Range   TSH 2.090 0.450 - 4.500 uIU/mL  Comprehensive metabolic panel  Result Value Ref Range   Glucose 86 65 - 99 mg/dL  BUN 15 8 - 27 mg/dL   Creatinine, Ser 1.610.87 0.57 - 1.00 mg/dL   GFR calc non Af Amer 65 >59 mL/min/1.73   GFR calc Af Amer 75 >59 mL/min/1.73   BUN/Creatinine Ratio 17 12 - 28   Sodium 141 134 - 144 mmol/L   Potassium 5.2 3.5 - 5.2 mmol/L   Chloride 100 96 - 106 mmol/L   CO2 23 18 - 29 mmol/L   Calcium 10.3 8.7 - 10.3 mg/dL   Total Protein 6.4 6.0 - 8.5 g/dL   Albumin 3.8 3.5 - 4.8 g/dL   Globulin, Total 2.6 1.5 - 4.5 g/dL   Albumin/Globulin Ratio 1.5 1.2 - 2.2   Bilirubin Total 0.4 0.0 - 1.2 mg/dL   Alkaline Phosphatase 72 39 - 117 IU/L   AST 15 0 - 40 IU/L   ALT 21 0 - 32 IU/L  Lipid Panel w/o Chol/HDL Ratio  Result Value Ref Range   Cholesterol, Total 197 100 - 199 mg/dL   Triglycerides 96 0 - 149 mg/dL   HDL 87 >09>39 mg/dL   VLDL Cholesterol Cal 19 5 - 40 mg/dL    LDL Calculated 91 0 - 99 mg/dL      Assessment & Plan:   Problem List Items Addressed This Visit      Unprioritized   Lymphedema   Mobility impaired - Primary   Rotator cuff impingement syndrome of right shoulder     She needs a OT and PT evaluation which she gets regularly at the lymphedema clinic   Follow up plan: Return if symptoms worsen or fail to improve.

## 2015-11-20 ENCOUNTER — Other Ambulatory Visit: Payer: Self-pay | Admitting: Unknown Physician Specialty

## 2015-11-20 ENCOUNTER — Ambulatory Visit: Payer: BC Managed Care – PPO | Attending: Unknown Physician Specialty | Admitting: Occupational Therapy

## 2015-11-20 DIAGNOSIS — I890 Lymphedema, not elsewhere classified: Secondary | ICD-10-CM | POA: Insufficient documentation

## 2015-11-22 ENCOUNTER — Ambulatory Visit: Payer: BC Managed Care – PPO | Admitting: Occupational Therapy

## 2015-11-22 NOTE — Patient Instructions (Signed)

## 2015-11-22 NOTE — Therapy (Signed)
[facility] [address] [place], [place], [address] Phone: [phone]   Fax:  [phone]  Occupational Therapy Treatment  Patient Details  Name: Barbara LevyBetty [name] MRN: [id number] Date of Birth: 12/24/1940 No Data Recorded  Encounter Date: 11/20/2015      OT End of Session - 11/22/15 0845    Visit Number 2   Number of Visits 36   Date for OT Re-Evaluation 01/28/16   OT Start Time 0200   OT Stop Time 0300   OT Time Calculation (min) 60 min      Past Medical History  Diagnosis Date  . Allergy   . Neuromuscular disorder (HCC)   . Arthritis   . Asthma   . GERD (gastroesophageal reflux disease)   . Chronic fatigue syndrome   . Hypertension   . Pneumonia     Past Surgical History  Procedure Laterality Date  . Abdominal hysterectomy  1974  . Cholecystectomy  2005  . Biopsy thyroid      There were no vitals filed for this visit.      Subjective Assessment - 11/22/15 0840    Subjective  Pt presents for Occupational Therapy 2 for treatment of mild-moderate BLE LE.Marland Kitchen. Pt completed successful courses of Complete Decongestive [facility] (CDT) [facility] the past at [facility] and [facility]. Pt  is accompanied by her daughter today who will assist Pt w/ visit interval compression wrapping and other aspects of LE self care PRN.    Patient is accompained by: Interpreter   Pertinent History Long standing, painful leg swelling; positive hx for lipidema onclear;    Limitations Difficulty walking, standing, and with functional transfers. Leg pain. shoulder pain. Body habitus limits ability to reach feet and lower legs needed to perform lower body basic ADLs. Hx lymphorrhea R calf by report. Hx non healling wounds   L calf by report.   Patient Stated Goals reduce swelling and keep it from getting worse   Currently in Pain? Yes   Pain Score --  not rated numerically. no change since initial evaluation.   Pain Location Leg   Pain Orientation Right;Left                       OT Treatments/Exercises (OP) - 11/22/15 0001    Manual Therapy   Manual Therapy Edema management;Compression Bandaging   Compression Bandaging LLE gradient compression wraps applied from base of toes to below knee as follows:                OT Education - 11/22/15 0846    Education provided Yes   Education Details Emphasis today on proper gradient compression wrapping using circumferential gradient technique, and on BLE lymphatic pumping there ex including seated, standing and supine variations. All hand outs given             OT Long Term Goals - 10/30/15 1621    OT LONG TERM GOAL #1   Title Lymphedema (LE) self-care: Pt able to apply multi layered, gradient compression wraps with Max caregiver assistance within 2 weeks to achieve optimal limb volume reduction.   Baseline dependent    Time 2   Period Weeks   Status New   OT LONG TERM GOAL #2   Title Lymphedema (LE) self-care:  Pt to achieve at least 10% BLE limb volume reductions during Intensive CDT to limit LE progression, decrease infection risk, to reduce pain/ discomfort, and to improve safe ambulation and  functional mobility.   Baseline dependent   Time 12   Period Weeks   Status New   OT LONG TERM GOAL #3   Title Pt >/= 85 % compliant with all daily, LE self-care protocols for home program, including simple self-manual lymphatic drainage (MLD), skin care, lymphatic pumping the ex, skin care, and donning/ doffing compression wraps and garments with Maximum caregiver assistance to limit LE progression and further functional decline.     Baseline dependent    Time 12   Period Weeks   Status New   OT LONG TERM GOAL #4   Title Pt to tolerate daily compression wraps, garments and devices in keeping w/ prescribed wear regime within 1 week of issue date to progress and retain clinical and functional gains and to limit LE progression.   Baseline dependent    Time 12   Period Weeks    Status New   OT LONG TERM GOAL #5   Title During Management Phase CDT Pt to sustain limb volume reductions achieved during Intensive Phase CDT within 5% utilizing LE self-care protocols, appropriate compression garments/ devices, and needed level of caregiver assistance.   Baseline dependent    Time 6   Period Months   Status New               Plan - 11/22/15 [id number]    Clinical Impression Statement using proper gradient technique with max A. Pt able to participate by giving feedback and verbal cues. Good session.   Rehab Potential Good   Clinical Impairments Affecting Rehab Potential TBD based on medical chart review. Awaiting MD notes   OT Frequency 3x / week   OT Duration 12 weeks   OT Treatment/Interventions Self-care/ADL training;DME and/or AE instruction;Manual lymph drainage;Patient/family education;Compression bandaging;Therapeutic exercises;Therapeutic activities;Manual Therapy   Consulted and Agree with Plan of Care Patient;Family member/caregiver      Patient will benefit from skilled therapeutic intervention in order to improve the following deficits and impairments:  Abnormal gait, Decreased knowledge of use of DME, Decreased skin integrity, Increased edema, Impaired flexibility, Pain, Decreased mobility, Decreased activity tolerance, Decreased endurance, Decreased range of motion, Decreased balance, Decreased knowledge of precautions, Difficulty walking  Visit Diagnosis: Lymphedema, not elsewhere classified    Problem List Patient Active Problem List   Diagnosis Date Noted  . Mobility impaired 11/10/2015  . Depression 11/06/2015  . Seborrheic keratoses 10/11/2015  . Rotator cuff impingement syndrome of right shoulder 10/11/2015  . Cellulitis 09/19/2015  . Asthma 08/23/2015  . Lymphedema 12/05/2014  . Essential hypertension, benign 12/05/2014    Loel Dubonnetheresa Svea Pusch, MS, OTR/L, [facility] 11/22/2015 8:59 AM  [facility] MA[facility] [address] [place], [place], [address] Phone: [phone]   Fax:  [phone]  Name: Barbara LevyBetty [name] MRN: [id number] Date of Birth: 02/12/1941

## 2015-11-24 ENCOUNTER — Other Ambulatory Visit: Payer: Self-pay | Admitting: Unknown Physician Specialty

## 2015-11-24 ENCOUNTER — Ambulatory Visit: Payer: BC Managed Care – PPO | Admitting: Occupational Therapy

## 2015-11-24 DIAGNOSIS — I890 Lymphedema, not elsewhere classified: Secondary | ICD-10-CM

## 2015-11-24 NOTE — Patient Instructions (Signed)
LE instructions and precautions as established- see initial eval.   

## 2015-11-24 NOTE — Therapy (Signed)
[facility] [address] [place], [place], [address] Phone: [phone]   Fax:  [phone]  Occupational Therapy Treatment  Patient Details  Name: Barbara LevyBetty [name] MRN: [id number] Date of Birth: 08/23/1940 No Data Recorded  Encounter Date: 11/24/2015      OT End of Session - 11/24/15 1555    Visit Number 3   Number of Visits 36   Date for OT Re-Evaluation 01/28/16   OT Start Time 0219   OT Stop Time 0305   OT Time Calculation (min) 46 min      Past Medical History  Diagnosis Date  . Allergy   . Neuromuscular disorder (HCC)   . Arthritis   . Asthma   . GERD (gastroesophageal reflux disease)   . Chronic fatigue syndrome   . Hypertension   . Pneumonia     Past Surgical History  Procedure Laterality Date  . Abdominal hysterectomy  1974  . Cholecystectomy  2005  . Biopsy thyroid      There were no vitals filed for this visit.      Subjective Assessment - 11/24/15 1547    Subjective  Pt presents for Occupational Therapy 3 for treatment of mild-moderate BLE LE. Pt is accompanied by her daughter. he reports that she tolerated compression wraps for ~ 12 hours after last session before removing them. "They felt like they were squeezing my ankle."   Patient is accompained by: Interpreter   Pertinent History Long standing, painful leg swelling; positive hx for lipidema onclear;    Limitations Difficulty walking, standing, and with functional transfers. Leg pain. shoulder pain. Body habitus limits ability to reach feet and lower legs needed to perform lower body basic ADLs. Hx lymphorrhea R calf by report. Hx non healling wounds   L calf by report.   Patient Stated Goals reduce swelling and keep it from getting worse   Currently in Pain? Yes  not numerically rated             LYMPHEDEMA/ONCOLOGY QUESTIONNAIRE - 11/24/15 1550    Right Lower Extremity Lymphedema   Other RLE ankle to knee (A-D) limb volume = 9246.56  ml.   Other RLE ankle to groin (A-G) limb volume = 15244.55 ml.   Other A-D Limb volume differential (LVD) measures 3.06%, and A-G LVD measures 1.92%    Left Lower Extremity Lymphedema   Other LLE ankle to knee (A-D) limb volume = 8963.85 ml.   Other LLE ankle to groin (A-G) limb volume = 14951.57 ml.                 OT Treatments/Exercises (OP) - 11/24/15 0001    ADLs   ADL Education Given Yes   Manual Therapy   Manual Therapy Edema management;Compression Bandaging   Manual therapy comments BLE comparative limb volumetrics   Compression Bandaging LLE gradient compression wraps applied from base of toes to below knee as follows:                OT Education - 11/24/15 1554    Education Details Emphasis of LE ADL training today on Pt and caregiver compression wrapping.   Person(s) Educated Patient;Child(ren)   Methods Explanation;Demonstration;Handout   Comprehension Verbalized understanding;Returned demonstration;Need further instruction;Verbal cues required             OT Long Term Goals - 10/30/15 1621    OT LONG TERM GOAL #1   Title Lymphedema (LE) self-care: Pt able to apply multi layered,  gradient compression wraps with Max caregiver assistance within 2 weeks to achieve optimal limb volume reduction.   Baseline dependent    Time 2   Period Weeks   Status New   OT LONG TERM GOAL #2   Title Lymphedema (LE) self-care:  Pt to achieve at least 10% BLE limb volume reductions during Intensive CDT to limit LE progression, decrease infection risk, to reduce pain/ discomfort, and to improve safe ambulation and functional mobility.   Baseline dependent   Time 12   Period Weeks   Status New   OT LONG TERM GOAL #3   Title Pt >/= 85 % compliant with all daily, LE self-care protocols for home program, including simple self-manual lymphatic drainage (MLD), skin care, lymphatic pumping the ex, skin care, and donning/ doffing compression wraps and garments with  Maximum caregiver assistance to limit LE progression and further functional decline.     Baseline dependent    Time 12   Period Weeks   Status New   OT LONG TERM GOAL #4   Title Pt to tolerate daily compression wraps, garments and devices in keeping w/ prescribed wear regime within 1 week of issue date to progress and retain clinical and functional gains and to limit LE progression.   Baseline dependent    Time 12   Period Weeks   Status New   OT LONG TERM GOAL #5   Title During Management Phase CDT Pt to sustain limb volume reductions achieved during Intensive Phase CDT within 5% utilizing LE self-care protocols, appropriate compression garments/ devices, and needed level of caregiver assistance.   Baseline dependent    Time 6   Period Months   Status New               Plan - 11/24/15 1555    Clinical Impression Statement  BLE comparative limb volumetrics reveal with both below the knee and full leg are nearly equal in volume and equally swollen. Limb volume differential reveals RLE is 3.06% LARGER FROM ANKLE TO  KNEE, AND 1.92% LARGER FROM ANKLE TO GROIN. eSSENTIALLY BOTH LEGS ARE EQUALLY SWOLLEN.By end ofsession daughter was able to correctly apply compression wraps to LLE with min A.   Rehab Potential Good   Clinical Impairments Affecting Rehab Potential TBD based on medical chart review. Awaiting MD notes   OT Frequency 3x / week   OT Duration 12 weeks   OT Treatment/Interventions Self-care/ADL training;DME and/or AE instruction;Manual lymph drainage;Patient/family education;Compression bandaging;Therapeutic exercises;Therapeutic activities;Manual Therapy   Consulted and Agree with Plan of Care Patient;Family member/caregiver      Patient will benefit from skilled therapeutic intervention in order to improve the following deficits and impairments:  Abnormal gait, Decreased knowledge of use of DME, Decreased skin integrity, Increased edema, Impaired flexibility, Pain,  Decreased mobility, Decreased activity tolerance, Decreased endurance, Decreased range of motion, Decreased balance, Decreased knowledge of precautions, Difficulty walking  Visit Diagnosis: Lymphedema, not elsewhere classified    Problem List Patient Active Problem List   Diagnosis Date Noted  . Mobility impaired 11/10/2015  . Depression 11/06/2015  . Seborrheic keratoses 10/11/2015  . Rotator cuff impingement syndrome of right shoulder 10/11/2015  . Cellulitis 09/19/2015  . Asthma 08/23/2015  . Lymphedema 12/05/2014  . Essential hypertension, benign 12/05/2014    Loel Dubonnet, MS, OTR/L, [facility] 11/24/2015 4:00 PM  [place] [facility] MAIN [facility] SERVICES [address] [place], [place], [address] Phone: [phone]   Fax:  [phone]  Name: [name] MRN:  [id number] Date of Birth: 03/18/1941

## 2015-11-27 ENCOUNTER — Ambulatory Visit: Payer: BC Managed Care – PPO | Admitting: Occupational Therapy

## 2015-11-29 ENCOUNTER — Ambulatory Visit: Payer: BC Managed Care – PPO | Admitting: Occupational Therapy

## 2015-12-01 ENCOUNTER — Telehealth: Payer: Self-pay | Admitting: Unknown Physician Specialty

## 2015-12-01 ENCOUNTER — Ambulatory Visit: Payer: BC Managed Care – PPO | Admitting: Occupational Therapy

## 2015-12-01 MED ORDER — CLARITHROMYCIN 500 MG PO TABS: 500.0000 mg | ORAL_TABLET | Freq: Two times a day (BID) | ORAL | Status: DC

## 2015-12-01 NOTE — Telephone Encounter (Signed)
Thinks she has cellulitis.  Will call in Clarithromyacin

## 2015-12-01 NOTE — Telephone Encounter (Signed)
Pt called and stated that she may possibly have cellulitis and would like to speak to PCP about what she should do to prevent infection. I advised the pt to call the wound clinic but she said she wasn't pleased with the way it was wrapped the last time. She still has some of the wrap at home and stated that once her daughter got there she would ask her to wrap it.

## 2015-12-11 ENCOUNTER — Telehealth: Payer: Self-pay

## 2015-12-11 DIAGNOSIS — Z7409 Other reduced mobility: Secondary | ICD-10-CM

## 2015-12-11 NOTE — Telephone Encounter (Signed)
[facility] sent a fax stating that patient's insurance is requiring the patient to have a one-time physical therapy evaluation. They state that we need to enter a referral for this and then fax them the report when it comes in.

## 2015-12-25 ENCOUNTER — Encounter: Payer: BC Managed Care – PPO | Admitting: Occupational Therapy

## 2015-12-27 ENCOUNTER — Encounter: Payer: BC Managed Care – PPO | Admitting: Occupational Therapy

## 2015-12-29 ENCOUNTER — Encounter: Payer: BC Managed Care – PPO | Admitting: Occupational Therapy

## 2016-01-01 ENCOUNTER — Encounter: Payer: BC Managed Care – PPO | Admitting: Occupational Therapy

## 2016-01-03 ENCOUNTER — Encounter: Payer: BC Managed Care – PPO | Admitting: Occupational Therapy

## 2016-01-05 ENCOUNTER — Encounter: Payer: BC Managed Care – PPO | Admitting: Occupational Therapy

## 2016-01-08 ENCOUNTER — Encounter: Payer: BC Managed Care – PPO | Admitting: Occupational Therapy

## 2016-01-09 ENCOUNTER — Other Ambulatory Visit: Payer: Self-pay | Admitting: Unknown Physician Specialty

## 2016-01-09 DIAGNOSIS — I890 Lymphedema, not elsewhere classified: Secondary | ICD-10-CM

## 2016-01-09 NOTE — Telephone Encounter (Signed)
Your patient 

## 2016-01-10 ENCOUNTER — Encounter: Payer: BC Managed Care – PPO | Admitting: Occupational Therapy

## 2016-01-12 ENCOUNTER — Encounter: Payer: BC Managed Care – PPO | Admitting: Occupational Therapy

## 2016-01-15 ENCOUNTER — Encounter: Payer: BC Managed Care – PPO | Admitting: Occupational Therapy

## 2016-01-17 ENCOUNTER — Encounter: Payer: BC Managed Care – PPO | Admitting: Occupational Therapy

## 2016-01-19 ENCOUNTER — Encounter: Payer: BC Managed Care – PPO | Admitting: Occupational Therapy

## 2016-01-24 ENCOUNTER — Encounter: Payer: BC Managed Care – PPO | Admitting: Occupational Therapy

## 2016-01-25 ENCOUNTER — Other Ambulatory Visit: Payer: Self-pay | Admitting: Unknown Physician Specialty

## 2016-01-26 ENCOUNTER — Encounter: Payer: BC Managed Care – PPO | Admitting: Occupational Therapy

## 2016-01-29 ENCOUNTER — Encounter: Payer: BC Managed Care – PPO | Admitting: Occupational Therapy

## 2016-01-31 ENCOUNTER — Encounter: Payer: BC Managed Care – PPO | Admitting: Occupational Therapy

## 2016-02-02 ENCOUNTER — Encounter: Payer: BC Managed Care – PPO | Admitting: Occupational Therapy

## 2016-02-05 ENCOUNTER — Encounter: Payer: BC Managed Care – PPO | Admitting: Occupational Therapy

## 2016-02-07 ENCOUNTER — Encounter: Payer: BC Managed Care – PPO | Admitting: Occupational Therapy

## 2016-02-09 ENCOUNTER — Encounter: Payer: BC Managed Care – PPO | Admitting: Occupational Therapy

## 2016-02-12 ENCOUNTER — Encounter: Payer: BC Managed Care – PPO | Admitting: Occupational Therapy

## 2016-02-13 ENCOUNTER — Ambulatory Visit: Payer: BC Managed Care – PPO | Admitting: Unknown Physician Specialty

## 2016-02-14 ENCOUNTER — Encounter: Payer: BC Managed Care – PPO | Admitting: Occupational Therapy

## 2016-02-16 ENCOUNTER — Encounter: Payer: BC Managed Care – PPO | Admitting: Occupational Therapy

## 2016-02-19 ENCOUNTER — Encounter: Payer: BC Managed Care – PPO | Admitting: Occupational Therapy

## 2016-02-21 ENCOUNTER — Other Ambulatory Visit: Payer: Self-pay | Admitting: Unknown Physician Specialty

## 2016-02-21 ENCOUNTER — Encounter: Payer: BC Managed Care – PPO | Admitting: Occupational Therapy

## 2016-02-21 ENCOUNTER — Encounter: Payer: Self-pay | Admitting: Unknown Physician Specialty

## 2016-02-21 ENCOUNTER — Ambulatory Visit (INDEPENDENT_AMBULATORY_CARE_PROVIDER_SITE_OTHER): Payer: BC Managed Care – PPO | Admitting: Unknown Physician Specialty

## 2016-02-21 VITALS — BP 111/63 | HR 73 | Temp 98.0°F | Ht 63.6 in | Wt 341.6 lb

## 2016-02-21 DIAGNOSIS — F3340 Major depressive disorder, recurrent, in remission, unspecified: Secondary | ICD-10-CM

## 2016-02-21 DIAGNOSIS — I10 Essential (primary) hypertension: Secondary | ICD-10-CM | POA: Diagnosis not present

## 2016-02-21 DIAGNOSIS — Z23 Encounter for immunization: Secondary | ICD-10-CM

## 2016-02-21 DIAGNOSIS — Z6841 Body Mass Index (BMI) 40.0 and over, adult: Secondary | ICD-10-CM | POA: Insufficient documentation

## 2016-02-21 NOTE — Patient Instructions (Addendum)

## 2016-02-21 NOTE — Assessment & Plan Note (Signed)
Doing [name] with good results.  She finds this way of eating as sustainable.

## 2016-02-21 NOTE — Progress Notes (Signed)
BP 111/63 (BP Location: Right Arm, Patient Position: Sitting, Cuff Size: Large)   Pulse 73   Temp 98 F (36.7 C)   Ht 5' 3.6" (1.615 m)   Wt (!) 341 lb 9.6 oz (154.9 kg)   LMP  (LMP Unknown)   SpO2 97%   BMI 59.38 kg/m    Subjective:    Patient ID: [name], female    DOB: 08/03/1940, 75 y.o.   MRN: [id number]  HPI: [name] is a 75 y.o. female  Chief Complaint  Patient presents with  . Hypertension  . Depression   Depression Pt is losing weight with Nutrisystem and lost 16 pounds since last visit.  She has also eliminated sugar and fried foods.  Her depression is stable and taking 1 pill/day of Wellbutrin.  Sleeps well.    Depression screen [facility] 2/9 02/21/2016 11/06/2015 12/05/2014  Decreased Interest 0 2 0  [name], Depressed, Hopeless 1 1 0  PHQ - 2 Score 1 3 0  Altered sleeping - 2 -  Tired, decreased energy - 1 -  Change in appetite - 0 -  Feeling bad or failure about yourself  - 0 -  Trouble concentrating - 0 -  Moving slowly or fidgety/restless - 0 -  Suicidal thoughts - 0 -  PHQ-9 Score - 6 -   Hypertension Using medications without difficulty Average home BPs Not checking   No problems or lightheadedness No chest pain with exertion or shortness of breath   Relevant past medical, surgical, family and social history reviewed and updated as indicated. Interim medical history since our last visit reviewed. Allergies and medications reviewed and updated.  Review of Systems  Per HPI unless specifically indicated above     Objective:    BP 111/63 (BP Location: Right Arm, Patient Position: Sitting, Cuff Size: Large)   Pulse 73   Temp 98 F (36.7 C)   Ht 5' 3.6" (1.615 m)   Wt (!) 341 lb 9.6 oz (154.9 kg)   LMP  (LMP Unknown)   SpO2 97%   BMI 59.38 kg/m   Wt Readings from Last 3 Encounters:  02/21/16 (!) 341 lb 9.6 oz (154.9 kg)  11/10/15 (!) 368 lb (166.9 kg)  11/06/15 (!) 368 lb 12.8 oz (167.3 kg)    Physical Exam  Constitutional: She is oriented  to person, place, and time. She appears well-developed and well-nourished. No distress.  HENT:  Head: Normocephalic and atraumatic.  Eyes: Conjunctivae and lids are normal. Right eye exhibits no discharge. Left eye exhibits no discharge. No scleral icterus.  Neck: Normal range of motion. Neck supple. No JVD present. Carotid bruit is not present.  Cardiovascular: Normal rate, regular rhythm and normal heart sounds.   Pulmonary/Chest: Effort normal and breath sounds normal.  Abdominal: Normal appearance. There is no splenomegaly or hepatomegaly.  Musculoskeletal: Normal range of motion.  Neurological: She is alert and oriented to person, place, and time.  Skin: Skin is warm, dry and intact. No rash noted. No pallor.  Psychiatric: She has a normal mood and affect. Her behavior is normal. Judgment and thought content normal.    Results for orders placed or performed in visit on 11/06/15  TSH  Result Value Ref Range   TSH 2.090 0.450 - 4.500 uIU/mL  Comprehensive metabolic panel  Result Value Ref Range   Glucose 86 65 - 99 mg/dL   BUN 15 8 - 27 mg/dL   Creatinine, Ser 0.86 0.57 - 1.00 mg/dL   GFR  calc non Af Amer 65 >59 mL/min/1.73   GFR calc Af Amer 75 >59 mL/min/1.73   BUN/Creatinine Ratio 17 12 - 28   Sodium 141 134 - 144 mmol/L   Potassium 5.2 3.5 - 5.2 mmol/L   Chloride 100 96 - 106 mmol/L   CO2 23 18 - 29 mmol/L   Calcium 10.3 8.7 - 10.3 mg/dL   Total Protein 6.4 6.0 - 8.5 g/dL   Albumin 3.8 3.5 - 4.8 g/dL   Globulin, Total 2.6 1.5 - 4.5 g/dL   Albumin/Globulin Ratio 1.5 1.2 - 2.2   Bilirubin Total 0.4 0.0 - 1.2 mg/dL   Alkaline Phosphatase 72 39 - 117 IU/L   AST 15 0 - 40 IU/L   ALT 21 0 - 32 IU/L  Lipid Panel w/o Chol/HDL Ratio  Result Value Ref Range   Cholesterol, Total 197 100 - 199 mg/dL   Triglycerides 96 0 - 149 mg/dL   HDL 87 >78>39 mg/dL   VLDL Cholesterol Cal 19 5 - 40 mg/dL   LDL Calculated 91 0 - 99 mg/dL      Assessment & Plan:   Problem List Items  Addressed This Visit      Unprioritized   Depression    Stable, continue present medications.        Essential hypertension, benign    Stable, continue present medications.  Monitor BP since losing weight        Other Visit Diagnoses    Need for influenza vaccination    -  Primary   Relevant Orders   Flu vaccine HIGH DOSE PF (Completed)      Obesity Doing well with Nutrisystem.    Follow up plan: Return for physica.

## 2016-02-21 NOTE — Assessment & Plan Note (Signed)
Stable, continue present medications.  Monitor BP since losing weight

## 2016-02-21 NOTE — Assessment & Plan Note (Signed)
Stable, continue present medications.   

## 2016-02-22 ENCOUNTER — Other Ambulatory Visit: Payer: Self-pay | Admitting: Unknown Physician Specialty

## 2016-02-22 DIAGNOSIS — E041 Nontoxic single thyroid nodule: Secondary | ICD-10-CM | POA: Diagnosis not present

## 2016-02-23 ENCOUNTER — Encounter: Payer: BC Managed Care – PPO | Admitting: Occupational Therapy

## 2016-02-26 ENCOUNTER — Other Ambulatory Visit: Payer: Self-pay

## 2016-02-26 ENCOUNTER — Encounter: Payer: BC Managed Care – PPO | Admitting: Occupational Therapy

## 2016-02-26 MED ORDER — OMEPRAZOLE 20 MG PO CPDR: 20.0000 mg | DELAYED_RELEASE_CAPSULE | Freq: Every day | ORAL | 3 refills | Status: DC

## 2016-02-28 ENCOUNTER — Encounter: Payer: BC Managed Care – PPO | Admitting: Occupational Therapy

## 2016-03-01 ENCOUNTER — Encounter: Payer: BC Managed Care – PPO | Admitting: Occupational Therapy

## 2016-03-04 ENCOUNTER — Encounter: Payer: BC Managed Care – PPO | Admitting: Occupational Therapy

## 2016-03-04 ENCOUNTER — Ambulatory Visit
Admission: RE | Admit: 2016-03-04 | Discharge: 2016-03-04 | Disposition: A | Discharge status: Home / Self Care | Payer: BC Managed Care – PPO | Source: Ambulatory Visit | Attending: Unknown Physician Specialty | Admitting: Unknown Physician Specialty

## 2016-03-04 DIAGNOSIS — E041 Nontoxic single thyroid nodule: Secondary | ICD-10-CM

## 2016-03-06 ENCOUNTER — Other Ambulatory Visit: Payer: Self-pay | Admitting: Unknown Physician Specialty

## 2016-03-06 DIAGNOSIS — E041 Nontoxic single thyroid nodule: Secondary | ICD-10-CM

## 2016-03-29 ENCOUNTER — Encounter: Payer: Self-pay | Admitting: Unknown Physician Specialty

## 2016-04-05 ENCOUNTER — Ambulatory Visit (INDEPENDENT_AMBULATORY_CARE_PROVIDER_SITE_OTHER): Payer: BC Managed Care – PPO | Admitting: Unknown Physician Specialty

## 2016-04-05 ENCOUNTER — Encounter: Payer: Self-pay | Admitting: Unknown Physician Specialty

## 2016-04-05 VITALS — BP 143/63 | HR 62 | Temp 98.2°F | Wt 337.8 lb

## 2016-04-05 DIAGNOSIS — R232 Flushing: Secondary | ICD-10-CM

## 2016-04-05 DIAGNOSIS — B029 Zoster without complications: Secondary | ICD-10-CM

## 2016-04-05 DIAGNOSIS — Z0000 Encounter for general adult medical examination without abnormal findings: Secondary | ICD-10-CM | POA: Diagnosis not present

## 2016-04-05 DIAGNOSIS — I10 Essential (primary) hypertension: Secondary | ICD-10-CM | POA: Diagnosis not present

## 2016-04-05 NOTE — Patient Instructions (Addendum)
Shingles Shingles, which is also known as herpes zoster, is an infection that causes a painful skin rash and fluid-filled blisters. Shingles is not related to genital herpes, which is a sexually transmitted infection. Shingles only develops in people who:  Have had chickenpox.  Have received the chickenpox vaccine. (This is rare.) What are the causes? Shingles is caused by varicella-zoster virus (VZV). This is the same virus that causes chickenpox. After exposure to VZV, the virus stays in the body in an inactive (dormant) state. Shingles develops if the virus reactivates. This can happen many years after the initial exposure to VZV. It is not known what causes this virus to reactivate. What increases the risk? People who have had chickenpox or received the chickenpox vaccine are at risk for shingles. Infection is more common in people who:  Are older than age 81.  Have a weakened defense (immune) system, such as those with HIV, AIDS, or cancer.  Are taking medicines that weaken the immune system, such as transplant medicines.  Are under great stress. What are the signs or symptoms? Early symptoms of this condition include itching, tingling, and pain in an area on your skin. Pain may be described as burning, stabbing, or throbbing. A few days or weeks after symptoms start, a painful red rash appears, usually on one side of the body in a bandlike or beltlike pattern. The rash eventually turns into fluid-filled blisters that break open, scab over, and dry up in about 2-3 weeks. At any time during the infection, you may also develop:  A fever.  Chills.  A headache.  An upset stomach. How is this diagnosed? This condition is diagnosed with a skin exam. Sometimes, skin or fluid samples are taken from the blisters before a diagnosis is made. These samples are examined under a microscope or sent to a lab for testing. How is this treated? There is no specific cure for this condition. Your  health care provider will probably prescribe medicines to help you manage pain, recover more quickly, and avoid long-term problems. Medicines may include:  Antiviral drugs.  Anti-inflammatory drugs.  Pain medicines. If the area involved is on your face, you may be referred to a specialist, such as an eye doctor (ophthalmologist) or an ear, nose, and throat (ENT) doctor to help you avoid eye problems, chronic pain, or disability. Follow these instructions at home: Medicines  Take medicines only as directed by your health care provider.  Apply an anti-itch or numbing cream to the affected area as directed by your health care provider. Blister and Rash Care  Take a cool bath or apply cool compresses to the area of the rash or blisters as directed by your health care provider. This may help with pain and itching.  Keep your rash covered with a loose bandage (dressing). Wear loose-fitting clothing to help ease the pain of material rubbing against the rash.  Keep your rash and blisters clean with mild soap and cool water or as directed by your health care provider.  Check your rash every day for signs of infection. These include redness, swelling, and pain that lasts or increases.  Do not pick your blisters.  Do not scratch your rash. General instructions  Rest as directed by your health care provider.  Keep all follow-up visits as directed by your health care provider. This is important.  Until your blisters scab over, your infection can cause chickenpox in people who have never had it or been vaccinated against it. To prevent  this from happening, avoid contact with other people, especially:  Babies.  Pregnant women.  Children who have eczema.  Elderly people who have transplants.  People who have chronic illnesses, such as leukemia or AIDS. Contact a health care provider if:  Your pain is not relieved with prescribed medicines.  Your pain does not get better after the rash  heals.  Your rash looks infected. Signs of infection include redness, swelling, and pain that lasts or increases. Get help right away if:  The rash is on your face or nose.  You have facial pain, pain around your eye area, or loss of feeling on one side of your face.  You have ear pain or you have ringing in your ear.  You have loss of taste.  Your condition gets worse. This information is not intended to replace advice given to you by your health care provider. Make sure you discuss any questions you have with your health care provider. ---------------------------------------------------------------- Please do call to schedule your mammogram; the number to schedule one at either [facility] or [facility] is [phone]

## 2016-04-05 NOTE — Assessment & Plan Note (Signed)
Will restart Irbesartan

## 2016-04-05 NOTE — Progress Notes (Signed)
BP (!) 143/63 (BP Location: Right Arm, Patient Position: Sitting, Cuff Size: Large)   Pulse 62   Temp 98.2 F (36.8 C)   Wt (!) 337 lb 12.8 oz (153.2 kg)   LMP  (LMP Unknown)   SpO2 98%   BMI 58.71 kg/m    Subjective:    Patient ID: [name], female    DOB: 03/07/1941, 75 y.o.   MRN: [id number]  HPI: Barbara LevyBetty Golinski is a 75 y.o. female  Chief Complaint  Patient presents with  . burning on face    pt states she had some burning on her face yesterday, states the right side was worse than the left   . Rash    pt states she has a rash on her right arm she would like looked at    Flushing Yesterday, had flushing of her cheeks with a lot of heat.  States it lasted all day and is now resolved with maybe one spot on right cheek this AM.  No fever.    Rash Right arm rash initially started with blisters.  States it itches. It has been there for about a month.  Only right arm and has not spread.     Hypertension Stopped her BP meds as she was worried it will bring her pulse down to low.  Stopped Irbesartan and Bystolic Average home BPs  SBP 150-182   No problems or lightheadedness No chest pain with exertion or shortness of breath No Edema  Relevant past medical, surgical, family and social history reviewed and updated as indicated. Interim medical history since our last visit reviewed. Allergies and medications reviewed and updated.  Review of Systems  Per HPI unless specifically indicated above     Objective:    BP (!) 143/63 (BP Location: Right Arm, Patient Position: Sitting, Cuff Size: Large)   Pulse 62   Temp 98.2 F (36.8 C)   Wt (!) 337 lb 12.8 oz (153.2 kg)   LMP  (LMP Unknown)   SpO2 98%   BMI 58.71 kg/m   Wt Readings from Last 3 Encounters:  04/05/16 (!) 337 lb 12.8 oz (153.2 kg)  02/21/16 (!) 341 lb 9.6 oz (154.9 kg)  11/10/15 (!) 368 lb (166.9 kg)    Physical Exam  Constitutional: She is oriented to person, place, and time. She appears well-developed and  well-nourished. No distress.  HENT:  Head: Normocephalic and atraumatic.  Eyes: Conjunctivae and lids are normal. Right eye exhibits no discharge. Left eye exhibits no discharge. No scleral icterus.  Neck: Normal range of motion. Neck supple. No JVD present. Carotid bruit is not present.  Cardiovascular: Normal rate, regular rhythm and normal heart sounds.   Pulmonary/Chest: Effort normal and breath sounds normal.  Abdominal: Normal appearance. There is no splenomegaly or hepatomegaly.  Musculoskeletal: Normal range of motion.  Neurological: She is alert and oriented to person, place, and time.  Skin: Skin is warm, dry and intact. No rash noted. No pallor.     erythemetous formerly vesicular lesions right arm  Psychiatric: She has a normal mood and affect. Her behavior is normal. Judgment and thought content normal.    Results for orders placed or performed in visit on 11/06/15  TSH  Result Value Ref Range   TSH 2.090 0.450 - 4.500 uIU/mL  Comprehensive metabolic panel  Result Value Ref Range   Glucose 86 65 - 99 mg/dL   BUN 15 8 - 27 mg/dL   Creatinine, Ser 8.650.87 0.57 - 1.00 mg/dL  GFR calc non Af Amer 65 >59 mL/min/1.73   GFR calc Af Amer 75 >59 mL/min/1.73   BUN/Creatinine Ratio 17 12 - 28   Sodium 141 134 - 144 mmol/L   Potassium 5.2 3.5 - 5.2 mmol/L   Chloride 100 96 - 106 mmol/L   CO2 23 18 - 29 mmol/L   Calcium 10.3 8.7 - 10.3 mg/dL   Total Protein 6.4 6.0 - 8.5 g/dL   Albumin 3.8 3.5 - 4.8 g/dL   Globulin, Total 2.6 1.5 - 4.5 g/dL   Albumin/Globulin Ratio 1.5 1.2 - 2.2   Bilirubin Total 0.4 0.0 - 1.2 mg/dL   Alkaline Phosphatase 72 39 - 117 IU/L   AST 15 0 - 40 IU/L   ALT 21 0 - 32 IU/L  Lipid Panel w/o Chol/HDL Ratio  Result Value Ref Range   Cholesterol, Total 197 100 - 199 mg/dL   Triglycerides 96 0 - 149 mg/dL   HDL 87 >04>39 mg/dL   VLDL Cholesterol Cal 19 5 - 40 mg/dL   LDL Calculated 91 0 - 99 mg/dL      Assessment & Plan:   Problem List Items  Addressed This Visit      Unprioritized   Essential hypertension, benign    Will restart Irbesartan       Other Visit Diagnoses    Herpes zoster without complication    -  Primary   Mild case.  Has been there for a month.  apply steroid cream she has at home   Skin blushing/flushing       Temporary episode.  Reassurance given   Routine general medical examination at a health care facility       Relevant Orders   MM DIGITAL SCREENING BILATERAL       Follow up plan: Return in about 4 weeks (around 05/03/2016) for for BP.

## 2016-04-18 ENCOUNTER — Other Ambulatory Visit: Payer: Self-pay | Admitting: Family Medicine

## 2016-05-15 ENCOUNTER — Encounter: Payer: BC Managed Care – PPO | Admitting: Unknown Physician Specialty

## 2016-05-17 ENCOUNTER — Ambulatory Visit (INDEPENDENT_AMBULATORY_CARE_PROVIDER_SITE_OTHER): Payer: BC Managed Care – PPO | Admitting: Family Medicine

## 2016-05-17 ENCOUNTER — Encounter: Payer: Self-pay | Admitting: Family Medicine

## 2016-05-17 VITALS — BP 117/80 | HR 59 | Temp 97.4°F | Ht 63.0 in

## 2016-05-17 DIAGNOSIS — L03317 Cellulitis of buttock: Secondary | ICD-10-CM

## 2016-05-17 MED ORDER — CLARITHROMYCIN 250 MG PO TABS: 250.0000 mg | ORAL_TABLET | Freq: Two times a day (BID) | ORAL | 0 refills | Status: DC

## 2016-05-17 NOTE — Progress Notes (Signed)
BP 117/80   Pulse (!) 59   Temp 97.4 F (36.3 C) (Oral)   Ht 5\' 3"  (1.6 m)   LMP  (LMP Unknown)   SpO2 94%    Subjective:    Patient ID: Barbara LevyBetty Corum, female    DOB: 06/15/1940, 75 y.o.   MRN: [id number]  HPI: [name] is a 75 y.o. female  Chief Complaint  Patient presents with  . Cellulitis    Right buttocks   SKIN INFECTION Duration: 3 days Location: R buttock History of trauma in area: no Pain: yes Quality: itching Severity:annoying Redness: yes Swelling: yes Oozing: no Pus: no Fevers: no Nausea/vomiting: no Status: better Treatments attempted: mucinex Tetanus: UTD  Relevant past medical, surgical, family and social history reviewed and updated as indicated. Interim medical history since our last visit reviewed. Allergies and medications reviewed and updated.  Review of Systems  Constitutional: Negative.   Respiratory: Negative.   Cardiovascular: Negative.   Skin: Positive for color change, rash and wound. Negative for pallor.  Psychiatric/Behavioral: Negative.     Per HPI unless specifically indicated above     Objective:    BP 117/80   Pulse (!) 59   Temp 97.4 F (36.3 C) (Oral)   Ht 5\' 3"  (1.6 m)   LMP  (LMP Unknown)   SpO2 94%   Wt Readings from Last 3 Encounters:  04/05/16 (!) 337 lb 12.8 oz (153.2 kg)  02/21/16 (!) 341 lb 9.6 oz (154.9 kg)  11/10/15 (!) 368 lb (166.9 kg)    Physical Exam  Constitutional: She is oriented to person, place, and time. She appears well-developed and well-nourished. No distress.  HENT:  Head: Normocephalic and atraumatic.  Right Ear: Hearing normal.  Left Ear: Hearing normal.  Nose: Nose normal.  Eyes: Conjunctivae and lids are normal. Right eye exhibits no discharge. Left eye exhibits no discharge. No scleral icterus.  Pulmonary/Chest: Effort normal. No respiratory distress.  Musculoskeletal: Normal range of motion.  Neurological: She is alert and oriented to person, place, and time.  Skin: Skin is warm,  dry and intact. No rash noted. There is erythema. No pallor.  Erythematous, warm patch in center of R buttock, no fluctuance, no oozing  Psychiatric: She has a normal mood and affect. Her speech is normal and behavior is normal. Judgment and thought content normal. Cognition and memory are normal.  Nursing note and vitals reviewed.   Results for orders placed or performed in visit on 11/06/15  TSH  Result Value Ref Range   TSH 2.090 0.450 - 4.500 uIU/mL  Comprehensive metabolic panel  Result Value Ref Range   Glucose 86 65 - 99 mg/dL   BUN 15 8 - 27 mg/dL   Creatinine, Ser 4.090.87 0.57 - 1.00 mg/dL   GFR calc non Af Amer 65 >59 mL/min/1.73   GFR calc Af Amer 75 >59 mL/min/1.73   BUN/Creatinine Ratio 17 12 - 28   Sodium 141 134 - 144 mmol/L   Potassium 5.2 3.5 - 5.2 mmol/L   Chloride 100 96 - 106 mmol/L   CO2 23 18 - 29 mmol/L   Calcium 10.3 8.7 - 10.3 mg/dL   Total Protein 6.4 6.0 - 8.5 g/dL   Albumin 3.8 3.5 - 4.8 g/dL   Globulin, Total 2.6 1.5 - 4.5 g/dL   Albumin/Globulin Ratio 1.5 1.2 - 2.2   Bilirubin Total 0.4 0.0 - 1.2 mg/dL   Alkaline Phosphatase 72 39 - 117 IU/L   AST 15 0 - 40  IU/L   ALT 21 0 - 32 IU/L  Lipid Panel w/o Chol/HDL Ratio  Result Value Ref Range   Cholesterol, Total 197 100 - 199 mg/dL   Triglycerides 96 0 - 149 mg/dL   HDL 87 >40>39 mg/dL   VLDL Cholesterol Cal 19 5 - 40 mg/dL   LDL Calculated 91 0 - 99 mg/dL      Assessment & Plan:   Problem List Items Addressed This Visit      Other   Cellulitis - Primary    Will treat with clarithyromycin due to her allergies. Call if not getting better or getting worse. Followig up with Elnita Maxwellheryl 05/29/16.          Follow up plan: Return As scheduled.

## 2016-05-17 NOTE — Assessment & Plan Note (Signed)
Will treat with clarithyromycin due to her allergies. Call if not getting better or getting worse. Followig up with Elnita Maxwellheryl 05/29/16.

## 2016-05-21 ENCOUNTER — Ambulatory Visit: Payer: BC Managed Care – PPO | Admitting: Unknown Physician Specialty

## 2016-05-22 ENCOUNTER — Telehealth: Payer: Self-pay | Admitting: Unknown Physician Specialty

## 2016-05-22 NOTE — Telephone Encounter (Signed)
Needs an appointment. Cellulitis was very mild.

## 2016-05-22 NOTE — Telephone Encounter (Signed)
Patient was in on Friday for cellulitis and was seen by Fleet Contrasachel  but patient called today stating her meds is running out and feels that it is coming back on her.  She would like a stronger antibiotic possibly.  She states the reason she ran out earlier was she did take 3 a day for a couple of days instead of 2 a day as prescribed.   She would like to know ASAP.  Thank You Clydie BraunKaren

## 2016-05-22 NOTE — Telephone Encounter (Signed)
Routing to provider who seen the patient.

## 2016-05-23 NOTE — Telephone Encounter (Signed)
Patient called to schedule the appt for her cellulitis.  Scheduled her to f/u with Elnita Maxwellheryl 1/5 @ 3:45

## 2016-05-24 ENCOUNTER — Encounter: Payer: Self-pay | Admitting: Unknown Physician Specialty

## 2016-05-24 ENCOUNTER — Ambulatory Visit (INDEPENDENT_AMBULATORY_CARE_PROVIDER_SITE_OTHER): Payer: BC Managed Care – PPO | Admitting: Unknown Physician Specialty

## 2016-05-24 DIAGNOSIS — R001 Bradycardia, unspecified: Secondary | ICD-10-CM | POA: Insufficient documentation

## 2016-05-24 DIAGNOSIS — L03317 Cellulitis of buttock: Secondary | ICD-10-CM | POA: Diagnosis not present

## 2016-05-24 MED ORDER — CLARITHROMYCIN 500 MG PO TABS: 500.0000 mg | ORAL_TABLET | Freq: Two times a day (BID) | ORAL | 0 refills | Status: DC

## 2016-05-24 MED ORDER — NEBIVOLOL HCL 5 MG PO TABS: 5.0000 mg | ORAL_TABLET | Freq: Every day | ORAL | 1 refills | Status: DC

## 2016-05-24 MED ORDER — "AQUACEL-AG EXTRA HYDROFIBER 2""X2"" EX PADS": MEDICATED_PAD | CUTANEOUS | 12 refills | Status: DC

## 2016-05-24 NOTE — Assessment & Plan Note (Signed)
Will Rx Biaxin 500 mg BID for 1 week.

## 2016-05-24 NOTE — Progress Notes (Signed)
BP (!) 123/53 (BP Location: Left Arm, Patient Position: Sitting, Cuff Size: Large)   Pulse (!) 52   Temp 97.7 F (36.5 C)   LMP  (LMP Unknown)   SpO2 100%    Subjective:    Patient ID: [name], female    DOB: 1940-09-18, 76 y.o.   MRN: [id number]  HPI: [name] is a 76 y.o. female  Chief Complaint  Patient presents with  . Cellulitis    1 week f/up, pt states her right buttock is still red    Cellulitis Saw Dr. Laural Benes for her Cellulitis of right Buttock.  She states this is still a problem and would like a longer course of antibiotics.  She states there is pain and heat to the area  Low pulse 52 today.  Doesn't feel bad but losing weight and BP is good.    Relevant past medical, surgical, family and social history reviewed and updated as indicated. Interim medical history since our last visit reviewed. Allergies and medications reviewed and updated.  Review of Systems  Per HPI unless specifically indicated above     Objective:    BP (!) 123/53 (BP Location: Left Arm, Patient Position: Sitting, Cuff Size: Large)   Pulse (!) 52   Temp 97.7 F (36.5 C)   LMP  (LMP Unknown)   SpO2 100%   Wt Readings from Last 3 Encounters:  04/05/16 (!) 337 lb 12.8 oz (153.2 kg)  02/21/16 (!) 341 lb 9.6 oz (154.9 kg)  11/10/15 (!) 368 lb (166.9 kg)    Physical Exam  Constitutional: She is oriented to person, place, and time. She appears well-developed and well-nourished. No distress.  HENT:  Head: Normocephalic and atraumatic.  Eyes: Conjunctivae and lids are normal. Right eye exhibits no discharge. Left eye exhibits no discharge. No scleral icterus.  Neck: Normal range of motion. Neck supple. No JVD present. Carotid bruit is not present.  Cardiovascular: Normal rate, regular rhythm and normal heart sounds.   Pulmonary/Chest: Effort normal and breath sounds normal.  Abdominal: Normal appearance. There is no splenomegaly or hepatomegaly.  Musculoskeletal: Normal range of motion.   Neurological: She is alert and oriented to person, place, and time.  Skin: Skin is warm, dry and intact. No pallor.  Mild erythema and warmth to touch right buttock  Psychiatric: She has a normal mood and affect. Her behavior is normal. Judgment and thought content normal.    Results for orders placed or performed in visit on 11/06/15  TSH  Result Value Ref Range   TSH 2.090 0.450 - 4.500 uIU/mL  Comprehensive metabolic panel  Result Value Ref Range   Glucose 86 65 - 99 mg/dL   BUN 15 8 - 27 mg/dL   Creatinine, Ser 2.13 0.57 - 1.00 mg/dL   GFR calc non Af Amer 65 >59 mL/min/1.73   GFR calc Af Amer 75 >59 mL/min/1.73   BUN/Creatinine Ratio 17 12 - 28   Sodium 141 134 - 144 mmol/L   Potassium 5.2 3.5 - 5.2 mmol/L   Chloride 100 96 - 106 mmol/L   CO2 23 18 - 29 mmol/L   Calcium 10.3 8.7 - 10.3 mg/dL   Total Protein 6.4 6.0 - 8.5 g/dL   Albumin 3.8 3.5 - 4.8 g/dL   Globulin, Total 2.6 1.5 - 4.5 g/dL   Albumin/Globulin Ratio 1.5 1.2 - 2.2   Bilirubin Total 0.4 0.0 - 1.2 mg/dL   Alkaline Phosphatase 72 39 - 117 IU/L   AST 15  0 - 40 IU/L   ALT 21 0 - 32 IU/L  Lipid Panel w/o Chol/HDL Ratio  Result Value Ref Range   Cholesterol, Total 197 100 - 199 mg/dL   Triglycerides 96 0 - 149 mg/dL   HDL 87 >30>39 mg/dL   VLDL Cholesterol Cal 19 5 - 40 mg/dL   LDL Calculated 91 0 - 99 mg/dL      Assessment & Plan:   Problem List Items Addressed This Visit      Unprioritized   Bradycardia    Decrease Bystolic to 5 mgs.  F/U next week for PE      Cellulitis    Will Rx Biaxin 500 mg BID for 1 week.          Follow up plan: Return for physical.

## 2016-05-24 NOTE — Assessment & Plan Note (Addendum)
Decrease Bystolic to 5 mgs.  F/U next week for PE

## 2016-05-27 ENCOUNTER — Ambulatory Visit
Admission: EM | Admit: 2016-05-27 | Discharge: 2016-05-27 | Disposition: A | Discharge status: Home / Self Care | Payer: BC Managed Care – PPO | Attending: Family Medicine | Admitting: Family Medicine

## 2016-05-27 ENCOUNTER — Encounter: Payer: Self-pay | Admitting: *Deleted

## 2016-05-27 ENCOUNTER — Ambulatory Visit (INDEPENDENT_AMBULATORY_CARE_PROVIDER_SITE_OTHER): Payer: BC Managed Care – PPO

## 2016-05-27 DIAGNOSIS — S8264XA Nondisplaced fracture of lateral malleolus of right fibula, initial encounter for closed fracture: Secondary | ICD-10-CM

## 2016-05-27 NOTE — ED Provider Notes (Signed)
CSN: [id number]     Arrival date & time 05/27/16  1546 History   First MD Initiated Contact with Patient 05/27/16 1656     Chief Complaint  Patient presents with  . Foot Pain   (Consider location/radiation/quality/duration/timing/severity/associated sxs/prior Treatment) HPI  This a 76 year old female who has severe lymphedema bilaterally. 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 Today she caught her right foot on a piece of furniture causing her into forced lateral deviation of her forefoot and forcefully pushing her lateral lower leg against a metal bar on her Motorized scooter. Since that time she's had some numbness and tingling extending into her toes most recently when she stood she had severe pain and was unable to bear weight.      Past Medical History:  Diagnosis Date  . Allergy   . Arthritis   . Asthma   . Chronic fatigue syndrome   . GERD (gastroesophageal reflux disease)   . Hypertension   . Neuromuscular disorder (HCC)   . Pneumonia    Past Surgical History:  Procedure Laterality Date  . ABDOMINAL HYSTERECTOMY  1974  . BIOPSY THYROID    . CHOLECYSTECTOMY  2005   Family History  Problem Relation Age of Onset  . Heart disease Mother   . Stroke Mother   . Diabetes Father   . Alcoholism Father   . Cancer Brother   . Hypertension Son    Social History  Substance Use Topics  . Smoking status: Never Smoker  . Smokeless tobacco: Never Used  . Alcohol use No   OB History    No data available     Review of Systems  Constitutional: Positive for activity change. Negative for chills, fatigue and fever.  Musculoskeletal: Positive for myalgias.  Skin: Positive for color change.  All other systems reviewed and are negative.   Allergies  Latex; Benicar [olmesartan]; Ciprofloxacin; Diovan [valsartan]; Flagyl [metronidazole]; Lotensin [benazepril hcl]; Penicillins; and Sulfa antibiotics  Home Medications   Prior to Admission  medications   Medication Sig Start Date End Date Taking? Authorizing Provider  albuterol (PROVENTIL HFA;VENTOLIN HFA) 108 (90 Base) MCG/ACT inhaler Inhale 2 puffs into the lungs every 6 (six) hours as needed for wheezing or shortness of breath. 06/19/15  Yes [name], NP  aspirin 81 MG tablet Take 81 mg by mouth daily.   Yes Historical Provider, MD  buPROPion [facility](WELLBUTRIN SR) 150 MG 12 hr tablet TAKE 1 TABLET BY MOUTH EVERY 12 HOURS 04/18/16  Yes Particia Nearingachel Elizabeth Lane, PA-C  Cholecalciferol (VITAMIN D-3 PO) Take by mouth daily.   Yes Historical Provider, MD  clarithromycin (BIAXIN) 500 MG tablet Take 1 tablet (500 mg total) by mouth 2 (two) times daily. 05/24/16  Yes [name], NP  Fluticasone-Salmeterol (ADVAIR DISKUS) 250-50 MCG/DOSE AEPB Inhale 1 puff into the lungs 2 (two) times daily. 08/23/15  Yes [name], NP  furosemide (LASIX) 40 MG tablet TAKE 1 TABLET BY MOUTH EVERY DAY 01/09/16  Yes [name], NP  Glucos-Chond-Hyal Ac-Ca Fructo (MOVE FREE JOINT HEALTH ADVANCE PO) Take by mouth daily.   Yes Historical Provider, MD  irbesartan (AVAPRO) 300 MG tablet Take 1 tablet (300 mg total) by mouth daily. 07/18/15  Yes [name], NP  magnesium oxide (MAG-OX) 400 MG tablet Take 400 mg by mouth daily.   Yes Historical Provider, MD  mupirocin cream (BACTROBAN) 2 % APPLY EXTERNALLY TO THE AFFECTED AREA TWICE DAILY 05/03/15  Yes Steele SizerMark A Crissman, MD  nebivolol (BYSTOLIC) 5 MG tablet Take 1 tablet (5 mg total) by mouth daily. 05/24/16  Yes Gabriel Cirri, NP  OIL OF OREGANO PO Take 40 mg by mouth daily.   Yes Historical Provider, MD  omeprazole (PRILOSEC) 20 MG capsule Take 1 capsule (20 mg total) by mouth daily. 02/26/16  Yes Gabriel Cirri, NP  triamcinolone cream (KENALOG) 0.1 % Apply 1 application topically 2 (two) times daily. 09/19/15  Yes Gabriel Cirri, NP  vitamin E 400 UNIT capsule Take 400 Units by mouth daily.   Yes Historical Provider, MD  LORazepam (ATIVAN) 1 MG tablet Take 1 tablet (1  mg total) by mouth daily as needed for anxiety. 1/2-1 tab 08/31/15   Steele Sizer, MD  Silver-Carboxymethylcellulose (AQUACEL-AG EXTRA HYDROFIBER) 2"X2" PADS Use daily prn 05/24/16   Gabriel Cirri, NP   Meds Ordered and Administered this Visit  Medications - No data to display  BP 113/83 (BP Location: Left Wrist)   Pulse (!) 54   Temp 97.7 F (36.5 C) (Oral)   Resp 16   Ht 5\' 3"  (1.6 m)   Wt (!) 345 lb (156.5 kg)   LMP  (LMP Unknown)   SpO2 100%   BMI 61.11 kg/m  No data found.   Physical Exam  Constitutional: She is oriented to person, place, and time. She appears well-developed and well-nourished. No distress.  HENT:  Head: Normocephalic and atraumatic.  Eyes: EOM are normal. Pupils are equal, round, and reactive to light.  Neck: Normal range of motion. Neck supple.  Musculoskeletal: Normal range of motion. She exhibits edema and tenderness.  Examination of the right ankle and foot is no significant erythema or ecchymosis. The leg has severe lymphedema present. She is maximally tender over the distal fibula. She has no tenderness of the foot forefoot and midfoot hindfoot or calcaneus. There is no tibial tenderness.  Neurological: She is alert and oriented to person, place, and time.  Skin: Skin is warm and dry. She is not diaphoretic.  Psychiatric: She has a normal mood and affect. Her behavior is normal. Judgment and thought content normal.  Nursing note and vitals reviewed.   Urgent Care Course   Clinical Course     Procedures (including critical care time)  Labs Review Labs Reviewed - No data to display  Imaging Review Dg Ankle Complete Right  Result Date: 05/27/2016 CLINICAL DATA:  Hyper extension of foot, pain and pop and ankle EXAM: RIGHT ANKLE - COMPLETE 3+ VIEW COMPARISON:  None. FINDINGS: Moderate subcutaneous soft tissue swelling. Nondisplaced linear fracture in the lateral fibular malleolus. Ankle mortise is symmetric. Bony spurring at the medial ankle joint  with possible small to age indeterminate avulsion. Moderate plantar spur. Calcifications at the Achilles tendon. IMPRESSION: 1. Acute nondisplaced lateral fibular malleolar fracture. 2. Possible small avulsion injury medial malleolus, age indeterminate. Electronically Signed   By: Jasmine Pang M.D.   On: 05/27/2016 17:39     Visual Acuity Review  Right Eye Distance:   Left Eye Distance:   Bilateral Distance:    Right Eye Near:   Left Eye Near:    Bilateral Near:     Posterior short leg splint was applied.  MDM   1. Closed nondisplaced fracture of lateral malleolus of right fibula, initial encounter    She will elevate and ice her right lateral ankle and distal leg area 20 minutes 2-3 times daily. Is unable to be fitted for a boot orthosis and a posterior splint was fashioned. SHe has  a scooter that she uses for mobility. Recommend she follow-up with a podiatrist and she was given the name of Dr. Ether Griffins. He can use Tylenol for pain control.    Lutricia Feil, PA-C 05/27/16 [phone]

## 2016-05-27 NOTE — ED Triage Notes (Signed)
Patient injured right foot this am. No history of right foot injury or surgery.

## 2016-05-29 ENCOUNTER — Encounter: Payer: BC Managed Care – PPO | Admitting: Unknown Physician Specialty

## 2016-06-24 ENCOUNTER — Other Ambulatory Visit: Payer: Self-pay | Admitting: Family Medicine

## 2016-06-24 MED ORDER — OSELTAMIVIR PHOSPHATE 75 MG PO CAPS: 75.0000 mg | ORAL_CAPSULE | Freq: Two times a day (BID) | ORAL | 0 refills | Status: DC

## 2016-07-03 ENCOUNTER — Other Ambulatory Visit: Payer: Self-pay | Admitting: Family Medicine

## 2016-07-10 ENCOUNTER — Ambulatory Visit (INDEPENDENT_AMBULATORY_CARE_PROVIDER_SITE_OTHER): Payer: BC Managed Care – PPO | Admitting: Unknown Physician Specialty

## 2016-07-10 ENCOUNTER — Encounter: Payer: Self-pay | Admitting: Unknown Physician Specialty

## 2016-07-10 VITALS — BP 141/71 | HR 88 | Temp 98.3°F | Ht 62.5 in | Wt 332.4 lb

## 2016-07-10 DIAGNOSIS — I890 Lymphedema, not elsewhere classified: Secondary | ICD-10-CM | POA: Diagnosis not present

## 2016-07-10 DIAGNOSIS — Z0000 Encounter for general adult medical examination without abnormal findings: Secondary | ICD-10-CM | POA: Diagnosis not present

## 2016-07-10 DIAGNOSIS — I10 Essential (primary) hypertension: Secondary | ICD-10-CM | POA: Diagnosis not present

## 2016-07-10 DIAGNOSIS — L989 Disorder of the skin and subcutaneous tissue, unspecified: Secondary | ICD-10-CM | POA: Diagnosis not present

## 2016-07-10 DIAGNOSIS — R001 Bradycardia, unspecified: Secondary | ICD-10-CM | POA: Diagnosis not present

## 2016-07-10 DIAGNOSIS — Z7189 Other specified counseling: Secondary | ICD-10-CM

## 2016-07-10 DIAGNOSIS — S8264XD Nondisplaced fracture of lateral malleolus of right fibula, subsequent encounter for closed fracture with routine healing: Secondary | ICD-10-CM

## 2016-07-10 DIAGNOSIS — I517 Cardiomegaly: Secondary | ICD-10-CM | POA: Diagnosis not present

## 2016-07-10 DIAGNOSIS — L03116 Cellulitis of left lower limb: Secondary | ICD-10-CM | POA: Diagnosis not present

## 2016-07-10 MED ORDER — CLARITHROMYCIN 500 MG PO TABS: 500.0000 mg | ORAL_TABLET | Freq: Two times a day (BID) | ORAL | 0 refills | Status: DC

## 2016-07-10 NOTE — Assessment & Plan Note (Addendum)
Biaxin with secondary cellulitis of left lower extremity. Patient will call clinic if wound fails to improve by 07/15/16.

## 2016-07-10 NOTE — Assessment & Plan Note (Signed)
Continue taking Lasix 40 mg as tolerated and continue low sodium diet and massages. Cardiologist will reassess for future management.

## 2016-07-10 NOTE — Patient Instructions (Signed)
Please do call to schedule your mammogram; the number to schedule one at either [facility] or [facility] is [phone]   

## 2016-07-10 NOTE — Progress Notes (Addendum)
BP (!) 141/71 (BP Location: Left Arm, Cuff Size: Large)   Pulse 88   Temp 98.3 F (36.8 C)   Ht 5' 2.5" (1.588 m)   Wt (!) 332 lb 6.4 oz (150.8 kg)   LMP  (LMP Unknown)   SpO2 97%   BMI 59.83 kg/m    Subjective:    Patient ID: [name], female    DOB: 1940-08-04, 76 y.o.   MRN: [id number]  HPI: [name] is a 76 y.o. female  Chief Complaint  Patient presents with  . 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 She gets massages, has a low sodium diet, and limits sodas that help relieve her water retention. She eats [employer] only and has lost 36 lbs since June 2017.   Wound to leg She hit the medial side of her lower left leg a week ago and denies any drainage, fever. She wrapped it with gauze and applied Neosporin with relief.   Dry patch She states that there is a dry scaly patch over her right wrist for the last 6 months that keeps reappearing although she applied apple cider vinegar to it. It is occasionally itchy. No color changes, drainage.   Well healed fibula fracture Patient went to ED on 05/27/2016 for closed, nondisplaced fracture of lateral right fibula. She had her cast removed 1 week ago and is ambulating well with her walker.   Hypertension  Prescribed Irbesartan 300 mg and Bystolic 5 mg but states her heart rate falls into the 40s when she takes both medications together even though Bystolic dose was decreased last visit for similar complaint. She has only been taking Bystolic for the past week. She is concerned about the bradycardia because her mother had a valve replacement secondary to bradycardia.  Average home Bps SBP 140-150.   Using medication and has experienced 5 dizzy spells in the past month No chest pain with exertion or shortness of breath Baseline chronic lymphedema symmetrically.   Relevant past medical, surgical, family and social  history reviewed and updated as indicated. Interim medical history since our last visit reviewed. Allergies and medications reviewed and updated.  Review of Systems   Per HPI unless specifically indicated above     Objective:    BP (!) 141/71 (BP Location: Left Arm, Cuff Size: Large)   Pulse 88   Temp 98.3 F (36.8 C)   Ht 5' 2.5" (1.588 m)   Wt (!) 332 lb 6.4 oz (150.8 kg)   LMP  (LMP Unknown)   SpO2 97%   BMI 59.83 kg/m   Wt Readings from Last 3 Encounters:  07/10/16 (!) 332 lb 6.4 oz (150.8 kg)  05/27/16 (!) 345 lb (156.5 kg)  04/05/16 (!) 337 lb 12.8 oz (153.2 kg)    Physical Exam  Constitutional: She is oriented to person, place, and time. She appears well-developed and well-nourished. No distress.  HENT:  Head: Normocephalic and atraumatic.  Eyes: Conjunctivae are normal. Right eye exhibits no discharge. Left eye exhibits no discharge. No scleral icterus.  Neck: Normal range of motion. Neck supple. No JVD present.  Cardiovascular: Normal rate, regular rhythm, normal heart sounds and intact distal pulses.   Chronic diffuse bilateral symmetrical lymphedema throughout all extremities which is patient's baseline.   Pulmonary/Chest: Effort normal and breath sounds normal. No respiratory distress. She has no wheezes. She has  no rales.  Abdominal: Soft. Bowel sounds are normal.  Musculoskeletal: Normal range of motion.  Neurological: She is alert and oriented to person, place, and time.  Skin: Skin is warm and dry.  2 cm linear wound over medial aspect of left ankle. Erythematous with mild warmth. No drainage, irritation. Nontender.  1 cm raised dry scaly patch over dorsal aspect of right wrist  Psychiatric: She has a normal mood and affect. Her behavior is normal. Judgment and thought content normal.      Assessment & Plan:   Problem List Items Addressed This Visit      Unprioritized   Bradycardia    Irbasartan 300 mg brought HR in 40s so she d/c it. She is concerned  because her mother had a valve replacement due to bradycardia. Will refer to cardiology.      Relevant Orders   Ambulatory referral to Cardiology   Cellulitis    Biaxin with secondary cellulitis of left lower extremity. Patient will call clinic if wound fails to improve by 07/15/16.       Essential hypertension, benign    Well controlled today at 141/71 only on Bystolic 5 mg for the past week. Discontinue Irbesartan 300 mg due to bradycardia.       Lymphedema    Continue taking Lasix 40 mg as tolerated and continue low sodium diet and massages. Cardiologist will reassess for future management.       Skin lesion of right arm    Will schedule for shave biopsy.        Other Visit Diagnoses    Enlarged heart    -  Primary   Diagnosed on x-[place]rummond and she is concerned about it. Will refer to cardiology.    Annual physical exam       Relevant Orders   MM DIGITAL SCREENING BILATERAL   CBC with Differential/Platelet   Comprehensive metabolic panel   Lipid Panel w/o Chol/HDL Ratio   TSH   Closed nondisplaced fracture of lateral malleolus of right fibula with routine healing, subsequent encounter       Resolved. Ambulating well with walker.   Advanced care planning/counseling discussion       Wants to fill out forms.  Wants her daughter to be a health care proxy.  Packet given.  DNR discussed.  She is a full code       Follow up plan: Return in about 6 months (around 01/07/2017) for return for 6 month visit and for shave bx.

## 2016-07-10 NOTE — Assessment & Plan Note (Addendum)
Well controlled today at 141/71 only on Bystolic 5 mg for the past week. Discontinue Irbesartan 300 mg due to bradycardia.

## 2016-07-10 NOTE — Assessment & Plan Note (Signed)
Will schedule for shave biopsy.

## 2016-07-10 NOTE — Assessment & Plan Note (Signed)
Irbasartan 300 mg brought HR in 40s so she d/c it. She is concerned because her mother had a valve replacement due to bradycardia. Will refer to cardiology.

## 2016-07-11 ENCOUNTER — Encounter: Payer: Self-pay | Admitting: Unknown Physician Specialty

## 2016-07-11 LAB — COMPREHENSIVE METABOLIC PANEL
ALBUMIN: 4.6 g/dL (ref 3.5–4.8)
ALT: 16 IU/L (ref 0–32)
AST: 17 IU/L (ref 0–40)
Albumin/Globulin Ratio: 1.8 (ref 1.2–2.2)
Alkaline Phosphatase: 94 IU/L (ref 39–117)
BUN / CREAT RATIO: 22 (ref 12–28)
BUN: 20 mg/dL (ref 8–27)
Bilirubin Total: 0.3 mg/dL (ref 0.0–1.2)
CO2: 23 mmol/L (ref 18–29)
CREATININE: 0.92 mg/dL (ref 0.57–1.00)
Calcium: 10.3 mg/dL (ref 8.7–10.3)
Chloride: 100 mmol/L (ref 96–106)
GFR calc Af Amer: 70 (ref >59)
GFR calc non Af Amer: 61 (ref >59)
GLOBULIN, TOTAL: 2.6 (ref 1.5–4.5)
Glucose: 83 mg/dL (ref 65–99)
Potassium: 4.6 mmol/L (ref 3.5–5.2)
SODIUM: 140 mmol/L (ref 134–144)
TOTAL PROTEIN: 7.2 g/dL (ref 6.0–8.5)

## 2016-07-11 LAB — CBC WITH DIFFERENTIAL/PLATELET
Basophils Absolute: 0 10*3/uL (ref 0.0–0.2)
Basos: 0 %
EOS (ABSOLUTE): 0 10*3/uL (ref 0.0–0.4)
EOS: 1 %
HEMATOCRIT: 45 % (ref 34.0–46.6)
HEMOGLOBIN: 15.3 g/dL (ref 11.1–15.9)
IMMATURE GRANS (ABS): 0 10*3/uL (ref 0.0–0.1)
IMMATURE GRANULOCYTES: 0 %
LYMPHS ABS: 2.2 10*3/uL (ref 0.7–3.1)
LYMPHS: 40 %
MCH: 30.7 pg (ref 26.6–33.0)
MCHC: 34 g/dL (ref 31.5–35.7)
MCV: 90 fL (ref 79–97)
MONOCYTES: 9 %
Monocytes Absolute: 0.5 10*3/uL (ref 0.1–0.9)
Neutrophils Absolute: 2.8 10*3/uL (ref 1.4–7.0)
Neutrophils: 50 %
Platelets: 285 10*3/uL (ref 150–379)
RBC: 4.99 x10E6/uL (ref 3.77–5.28)
RDW: 14.8 % (ref 12.3–15.4)
WBC: 5.4 10*3/uL (ref 3.4–10.8)

## 2016-07-11 LAB — LIPID PANEL W/O CHOL/HDL RATIO
Cholesterol, Total: 237 mg/dL — ABNORMAL HIGH (ref 100–199)
HDL: 94 mg/dL (ref >39)
LDL CALC: 123 — AB (ref 0–99)
Triglycerides: 102 mg/dL (ref 0–149)
VLDL CHOLESTEROL CAL: 20 (ref 5–40)

## 2016-07-11 LAB — TSH: TSH: 2.67 u[IU]/mL (ref 0.450–4.500)

## 2016-07-15 ENCOUNTER — Other Ambulatory Visit: Payer: Self-pay | Admitting: Family Medicine

## 2016-07-15 NOTE — Telephone Encounter (Signed)
  Last routine OV: 07/10/16 Next OV: 08/21/16

## 2016-08-11 NOTE — Progress Notes (Addendum)
Cardiology Office Note  Date:  08/12/2016   ID:  Barbara ApplebaumBetty M [name], DOB 09/18/1940, MRN [id number]  PCP:  Barbara Cirriheryl Wicker, NP   Chief Complaint  Patient presents with  . other    NP. referred for Bradycardia per [name]. Pt states she has chest pain 3-4 months ago. Pt c/o dizziness, but believes is due to losartan/bystolic together. Those meds were adjusted and no longer has dizzy spells. Pt c/o bradycardia, numbness in bilateral fingers. Reviewed meds with pt verbally.    HPI:  [name] is a pleasant 76 year old woman With history of Morbid obesity Chronic Leg edema Lymphedema HTN Referred By [name] for consultation of her bradycardia, enlarged heart on CXR  Stopped ARB, stayed on bystolic Has not taken the beta blocker for 2 days On lasix  As needed Lymphedema, arms and legs Does not use her compression pumps on a regular basis She does have compression hose  CXR Reviewed with her in detail The heart is enlarged but stable. There is moderate tortuosity, ectasia and calcification of the thoracic aorta.  Denies having any symptoms of orthostasis when her heart rate is slow Heart rate increases up to 80 bpm with ambulation She monitors heart rate with wrist watch  No regular exercise program, uses a walker in her house Denies any chest pain, shortness of breath on exertion Family is moving in with her  EKG on today's visit shows sinus bradycardia rate 55 bpm no significant ST or T-wave changes  PMH:   has a past medical history of Allergy; Arthritis; Asthma; Chronic fatigue syndrome; Fractured fibula; GERD (gastroesophageal reflux disease); Hypertension; Lymphedema (2004); Neuromuscular disorder (HCC); and Pneumonia.  PSH:    Past Surgical History:  Procedure Laterality Date  . ABDOMINAL HYSTERECTOMY  1974  . BIOPSY THYROID    . CHOLECYSTECTOMY  2005    Current Outpatient Prescriptions  Medication Sig Dispense Refill  . albuterol (PROVENTIL HFA;VENTOLIN HFA) 108  (90 Base) MCG/ACT inhaler Inhale 2 puffs into the lungs every 6 (six) hours as needed for wheezing or shortness of breath. 1 Inhaler 0  . Apple Cider Vinegar 500 MG TABS Take 500 mg by mouth 4 (four) times daily.    Marland Kitchen. aspirin 81 MG tablet Take 81 mg by mouth daily.    Marland Kitchen. buPROPion (WELLBUTRIN SR) 150 MG 12 hr tablet TAKE 1 TABLET BY MOUTH EVERY 12 HOURS 60 tablet 0  . Cholecalciferol (VITAMIN D-3 PO) Take by mouth daily.    . Fluticasone-Salmeterol (ADVAIR DISKUS) 250-50 MCG/DOSE AEPB Inhale 1 puff into the lungs 2 (two) times daily. 60 each 2  . furosemide (LASIX) 40 MG tablet Take 1 tablet (40 mg total) by mouth daily as needed. 30 tablet 0  . Glucos-Chond-Hyal Ac-Ca Fructo (MOVE FREE JOINT HEALTH ADVANCE PO) Take by mouth daily.    Marland Kitchen. LORazepam (ATIVAN) 1 MG tablet Take 1 tablet (1 mg total) by mouth daily as needed for anxiety. 1/2-1 tab 30 tablet 0  . magnesium oxide (MAG-OX) 400 MG tablet Take 400 mg by mouth daily.    . mupirocin cream (BACTROBAN) 2 % APPLY EXTERNALLY TO THE AFFECTED AREA TWICE DAILY 15 g 0  . nebivolol (BYSTOLIC) 5 MG tablet Take 1 tablet (5 mg total) by mouth daily. 30 tablet 1  . OIL OF OREGANO PO Take 40 mg by mouth daily.    Marland Kitchen. omeprazole (PRILOSEC) 20 MG capsule Take 1 capsule (20 mg total) by mouth daily. 90 capsule 3  . Silver-Carboxymethylcellulose (AQUACEL-AG EXTRA  HYDROFIBER) 2"X2" PADS Use daily prn 10 each 12  . traMADol (ULTRAM) 50 MG tablet TK 1 T PO  Q 6 H PRN P  1  . triamcinolone cream (KENALOG) 0.1 % Apply 1 application topically 2 (two) times daily. 453.6 g 1  . vitamin E 400 UNIT capsule Take 400 Units by mouth daily.    Marland Kitchen atorvastatin (LIPITOR) 10 MG tablet Take 1 tablet (10 mg total) by mouth daily. 30 tablet 11   Current Facility-Administered Medications  Medication Dose Route Frequency Provider Last Rate Last Dose  . albuterol (PROVENTIL) (2.5 MG/3ML) 0.083% nebulizer solution 2.5 mg  2.5 mg Nebulization Once Barbara Cirri, NP         Allergies:    Latex; Benicar [olmesartan]; Ciprofloxacin; Diovan [valsartan]; Flagyl [metronidazole]; Lotensin [benazepril hcl]; Penicillins; and Sulfa antibiotics   Social History:  The patient  reports that she has never smoked. She has never used smokeless tobacco. She reports that she does not drink alcohol or use drugs.   Family History:   family history includes Alcoholism in her father; Cancer in her brother; Diabetes in her father; Heart disease in her mother; Hyperlipidemia in her mother; Hypertension in her son; Stroke in her maternal grandmother.    Review of Systems: Review of Systems  Constitutional: Negative.   Respiratory: Negative.   Cardiovascular: Positive for leg swelling.  Gastrointestinal: Negative.   Musculoskeletal: Negative.        Leg weakness, fall risk  Neurological: Negative.   Psychiatric/Behavioral: Negative.   All other systems reviewed and are negative.    PHYSICAL EXAM: VS:  BP 100/64 (BP Location: Right Arm, Patient Position: Sitting, Cuff Size: Normal)   Pulse (!) 55   Ht 5\' 3"  (1.6 m)   Wt (!) 331 lb (150.1 kg)   LMP  (LMP Unknown)   BMI 58.63 kg/m  , BMI Body mass index is 58.63 kg/m. GEN: Well nourished, well developed, in no acute distress, Morbidly obese  HEENT: normal  Neck: no JVD, carotid bruits, or masses Cardiac: RRR; no murmurs, rubs, or gallops,Nonpitting significant leg edema bilaterally Respiratory:  clear to auscultation bilaterally, normal work of breathing GI: soft, nontender, nondistended, + BS MS: no deformity or atrophy  Skin: warm and dry, no rash Neuro:  Strength and sensation are intact Psych: euthymic mood, full affect    Recent Labs: 07/10/2016: ALT 16; BUN 20; Creatinine, Ser 0.92; Platelets 285; Potassium 4.6; Sodium 140; TSH 2.670    Lipid Panel Lab Results  Component Value Date   CHOL 237 (H) 07/10/2016   HDL 94 07/10/2016   LDLCALC 123 (H) 07/10/2016   TRIG 102 07/10/2016      Wt Readings from Last 3  Encounters:  08/12/16 (!) 331 lb (150.1 kg)  07/10/16 (!) 332 lb 6.4 oz (150.8 kg)  05/27/16 (!) 345 lb (156.5 kg)       ASSESSMENT AND PLAN:  Essential hypertension, benign - Plan: EKG 12-Lead Blood pressure low on today's visit 100 systolic on my check She is not taking any medication in 2 days Recommended she continue to monitor blood pressure and only take one half bystolic for high blood pressure  Bradycardia - Plan: EKG 12-Lead Not particularly concerned about heart rate, She is asymptomatic Blood pressure is low,  Discussed with her that beta blockers can lower her heart rate and she likely does not need a beta blocker If she gets orthostasis symptoms recommended she liberalize her salt intake, fluid intake  Morbid obesity (HCC) -  Plan: EKG 12-Lead We have encouraged continued exercise, careful diet management in an effort to lose weight.  Lymphedema - Pt with frequent cellulitis due to this condition,  Rx for Clindamyacin for prn use.  Refill Furosemide.  Order new compression farments - Plan: furosemide (LASIX) 40 MG tablet  Mixed hyperlipidemia Long discussion about ectasia seen on chest x-Kleinschmidt Cholesterol is elevated Recommended she start Lipitor 10 mg every other day with slow titration up to daily  Aortic atherosclerosis (HCC) X-Bockrath results discussed with her. Recommended management of her cholesterol   Total encounter time more than 45 minutes  Greater than 50% was spent in counseling and coordination of care with the patient    Disposition:   F/U  12 months   Orders Placed This Encounter  Procedures  . EKG 12-Lead     Signed, Dossie Arbour, M.D., Ph.D. 08/12/2016  [facility] [place], [place] [phone]

## 2016-08-12 ENCOUNTER — Ambulatory Visit (INDEPENDENT_AMBULATORY_CARE_PROVIDER_SITE_OTHER): Payer: BC Managed Care – PPO | Admitting: Cardiovascular Disease

## 2016-08-12 ENCOUNTER — Encounter: Payer: Self-pay | Admitting: Cardiovascular Disease

## 2016-08-12 VITALS — BP 100/64 | HR 55 | Ht 63.0 in | Wt 331.0 lb

## 2016-08-12 DIAGNOSIS — I10 Essential (primary) hypertension: Secondary | ICD-10-CM | POA: Diagnosis not present

## 2016-08-12 DIAGNOSIS — I700 Atherosclerosis of aorta: Secondary | ICD-10-CM

## 2016-08-12 DIAGNOSIS — R001 Bradycardia, unspecified: Secondary | ICD-10-CM | POA: Diagnosis not present

## 2016-08-12 DIAGNOSIS — E782 Mixed hyperlipidemia: Secondary | ICD-10-CM | POA: Diagnosis not present

## 2016-08-12 DIAGNOSIS — I890 Lymphedema, not elsewhere classified: Secondary | ICD-10-CM | POA: Diagnosis not present

## 2016-08-12 MED ORDER — ATORVASTATIN CALCIUM 10 MG PO TABS: 10.0000 mg | ORAL_TABLET | Freq: Every day | ORAL | 11 refills | Status: DC

## 2016-08-12 NOTE — Patient Instructions (Addendum)
Medication Instructions:   Consider start atorvastatin one pill every other day for cholesterol If no problems, increase up to daily Lab check in 3 months  Please call for worsening dizziness with standing That could mean low blood pressure  Don't panic with low heart rate   Labwork:  No new labs needed  Testing/Procedures:  No further testing at this time   Follow-Up: It was a pleasure seeing you in the office today. Please call us if you have new issues that need to be addressed before your next appt.  [phone]  Your physician wants you to follow-up in: 12 months.  You will receive a reminder letter in the mail two months in advance. If you don't receive a letter, please call our office to schedule the follow-up appointment.  If you need a refill on your cardiac medications before your next appointment, please call your pharmacy.

## 2016-08-19 ENCOUNTER — Ambulatory Visit: Payer: BC Managed Care – PPO | Admitting: Cardiovascular Disease

## 2016-08-21 ENCOUNTER — Other Ambulatory Visit: Payer: Self-pay | Admitting: Unknown Physician Specialty

## 2016-08-21 ENCOUNTER — Encounter: Payer: Self-pay | Admitting: Unknown Physician Specialty

## 2016-08-21 ENCOUNTER — Ambulatory Visit (INDEPENDENT_AMBULATORY_CARE_PROVIDER_SITE_OTHER): Payer: BC Managed Care – PPO | Admitting: Unknown Physician Specialty

## 2016-08-21 DIAGNOSIS — L989 Disorder of the skin and subcutaneous tissue, unspecified: Secondary | ICD-10-CM

## 2016-08-21 NOTE — Progress Notes (Signed)
BP (!) 126/91 (BP Location: Right Arm, Patient Position: Sitting, Cuff Size: Large)   Pulse 71   Temp 97.9 F (36.6 C)   Wt (!) 334 lb 12.8 oz (151.9 kg)   LMP  (LMP Unknown)   SpO2 98%   BMI 59.31 kg/m    Subjective:    Patient ID: [name], female    DOB: [birth year], 76 y.o.   MRN: [id number]  HPI: [name] is a 76 y.o. female  Chief Complaint  Patient presents with  . Shave Biopsy    right wrist   Skin lesion right wrist not healing.  Gets scaley and flakes off.  Has been there for several months.    Relevant past medical, surgical, family and social history reviewed and updated as indicated. Interim medical history since our last visit reviewed. Allergies and medications reviewed and updated.  Review of Systems  Per HPI unless specifically indicated above     Objective:    BP (!) 126/91 (BP Location: Right Arm, Patient Position: Sitting, Cuff Size: Large)   Pulse 71   Temp 97.9 F (36.6 C)   Wt (!) 334 lb 12.8 oz (151.9 kg)   LMP  (LMP Unknown)   SpO2 98%   BMI 59.31 kg/m   Wt Readings from Last 3 Encounters:  08/21/16 (!) 334 lb 12.8 oz (151.9 kg)  08/12/16 (!) 331 lb (150.1 kg)  07/10/16 (!) 332 lb 6.4 oz (150.8 kg)    Physical Exam  Constitutional: She is oriented to person, place, and time. She appears well-developed and well-nourished. No distress.  HENT:  Head: Normocephalic and atraumatic.  Eyes: Conjunctivae and lids are normal. Right eye exhibits no discharge. Left eye exhibits no discharge. No scleral icterus.  Pulmonary/Chest: Effort normal.  Abdominal: Normal appearance. There is no splenomegaly or hepatomegaly.  Musculoskeletal: Normal range of motion.  Neurological: She is alert and oriented to person, place, and time.  Skin: Skin is intact. No rash noted. No pallor.  Dry scaley 1 cm lesion right wrist  Psychiatric: She has a normal mood and affect. Her behavior is normal. Judgment and thought content normal.    Results for orders  placed or performed in visit on 07/10/16  CBC with Differential/Platelet  Result Value Ref Range   WBC 5.4 3.4 - 10.8 x10E3/uL   RBC 4.99 3.77 - 5.28 x10E6/uL   Hemoglobin 15.3 11.1 - 15.9 g/dL   Hematocrit 78.2 95.6 - 46.6 %   MCV 90 79 - 97 fL   MCH 30.7 26.6 - 33.0 pg   MCHC 34.0 31.5 - 35.7 g/dL   RDW 21.3 08.6 - 57.8 %   Platelets 285 150 - 379 x10E3/uL   Neutrophils 50 Not Estab. %   Lymphs 40 Not Estab. %   Monocytes 9 Not Estab. %   Eos 1 Not Estab. %   Basos 0 Not Estab. %   Neutrophils Absolute 2.8 1.4 - 7.0 x10E3/uL   Lymphocytes Absolute 2.2 0.7 - 3.1 x10E3/uL   Monocytes Absolute 0.5 0.1 - 0.9 x10E3/uL   EOS (ABSOLUTE) 0.0 0.0 - 0.4 x10E3/uL   Basophils Absolute 0.0 0.0 - 0.2 x10E3/uL   Immature Granulocytes 0 Not Estab. %   Immature Grans (Abs) 0.0 0.0 - 0.1 x10E3/uL  Comprehensive metabolic panel  Result Value Ref Range   Glucose 83 65 - 99 mg/dL   BUN 20 8 - 27 mg/dL   Creatinine, Ser 4.69 0.57 - 1.00 mg/dL   GFR calc  non Af Amer 61 >59   GFR calc Af Amer 70 >59   BUN/Creatinine Ratio 22 12 - 28   Sodium 140 134 - 144 mmol/L   Potassium 4.6 3.5 - 5.2 mmol/L   Chloride 100 96 - 106 mmol/L   CO2 23 18 - 29 mmol/L   Calcium 10.3 8.7 - 10.3 mg/dL   Total Protein 7.2 6.0 - 8.5 g/dL   Albumin 4.6 3.5 - 4.8 g/dL   Globulin, Total 2.6 1.5 - 4.5   Albumin/Globulin Ratio 1.8 1.2 - 2.2   Bilirubin Total 0.3 0.0 - 1.2 mg/dL   Alkaline Phosphatase 94 39 - 117 IU/L   AST 17 0 - 40 IU/L   ALT 16 0 - 32 IU/L  Lipid Panel w/o Chol/HDL Ratio  Result Value Ref Range   Cholesterol, Total 237 (H) 100 - 199 mg/dL   Triglycerides 161 0 - 149 mg/dL   HDL 94 >09 mg/dL   VLDL Cholesterol Cal 20 5 - 40   LDL Calculated 123 (H) 0 - 99  TSH  Result Value Ref Range   TSH 2.670 0.450 - 4.500 uIU/mL      Assessment & Plan:   Problem List Items Addressed This Visit      Unprioritized   Skin lesion of right arm    After informed consent and pt ed on scarring, area  infiltrated with lidocaine with Epinephrine.  Part of the dermis shaved off with # 15 blade.  Sample sent for pathology.  Area cauterized with silver nitrate stick.   Pt tolerated the procedure well       Relevant Orders   Pathology       Follow up plan: No Follow-up on file.

## 2016-08-21 NOTE — Assessment & Plan Note (Signed)
After informed consent and pt ed on scarring, area infiltrated with lidocaine with Epinephrine.  Part of the dermis shaved off with # 15 blade.  Sample sent for pathology.  Area cauterized with silver nitrate stick.   Pt tolerated the procedure well

## 2016-08-22 ENCOUNTER — Other Ambulatory Visit (HOSPITAL_COMMUNITY): Payer: Self-pay | Admitting: Unknown Physician Specialty

## 2016-08-22 DIAGNOSIS — E041 Nontoxic single thyroid nodule: Secondary | ICD-10-CM

## 2016-08-26 LAB — PATHOLOGY

## 2016-08-30 ENCOUNTER — Ambulatory Visit: Payer: BC Managed Care – PPO

## 2016-09-09 ENCOUNTER — Ambulatory Visit
Admission: RE | Admit: 2016-09-09 | Discharge: 2016-09-09 | Disposition: A | Discharge status: Home / Self Care | Payer: BC Managed Care – PPO | Source: Ambulatory Visit | Attending: Unknown Physician Specialty | Admitting: Unknown Physician Specialty

## 2016-09-09 ENCOUNTER — Ambulatory Visit: Payer: BC Managed Care – PPO

## 2016-09-09 DIAGNOSIS — E041 Nontoxic single thyroid nodule: Secondary | ICD-10-CM

## 2016-09-12 ENCOUNTER — Other Ambulatory Visit: Payer: Self-pay | Admitting: Unknown Physician Specialty

## 2016-09-12 DIAGNOSIS — E041 Nontoxic single thyroid nodule: Secondary | ICD-10-CM

## 2016-09-23 ENCOUNTER — Other Ambulatory Visit: Payer: Self-pay | Admitting: Unknown Physician Specialty

## 2016-10-18 ENCOUNTER — Other Ambulatory Visit: Payer: Self-pay

## 2016-10-18 DIAGNOSIS — Z79899 Other long term (current) drug therapy: Secondary | ICD-10-CM

## 2016-11-08 ENCOUNTER — Ambulatory Visit (INDEPENDENT_AMBULATORY_CARE_PROVIDER_SITE_OTHER): Payer: BC Managed Care – PPO

## 2016-11-08 ENCOUNTER — Ambulatory Visit: Payer: BC Managed Care – PPO | Admitting: Unknown Physician Specialty

## 2016-11-08 VITALS — BP 140/68 | HR 78 | Temp 98.3°F | Resp 16 | Ht 63.0 in | Wt 339.6 lb

## 2016-11-08 DIAGNOSIS — I890 Lymphedema, not elsewhere classified: Secondary | ICD-10-CM

## 2016-11-08 DIAGNOSIS — Z1211 Encounter for screening for malignant neoplasm of colon: Secondary | ICD-10-CM

## 2016-11-08 DIAGNOSIS — Z0000 Encounter for general adult medical examination without abnormal findings: Secondary | ICD-10-CM

## 2016-11-08 MED ORDER — VALACYCLOVIR HCL 1 G PO TABS: 1000.0000 mg | ORAL_TABLET | Freq: Three times a day (TID) | ORAL | 0 refills | Status: DC

## 2016-11-08 MED ORDER — CLARITHROMYCIN 500 MG PO TABS
500.0000 mg | ORAL_TABLET | Freq: Two times a day (BID) | ORAL | 0 refills | Status: DC
Start: 2016-11-08 — End: 2016-12-06

## 2016-11-08 NOTE — Assessment & Plan Note (Addendum)
I don't see a rash.  Pt states this happens a lot and relieved in the past with Clarithromyacin.  Will rx to have available over the weekend if needs it.  Valtrex to r/o shingels

## 2016-11-08 NOTE — Progress Notes (Signed)
Subjective:   [name] is a 76 y.o. female who presents for Medicare Annual (Subsequent) preventive examination.  Review of Systems:   Cardiac Risk Factors include: advanced age (>57men, >49 women);hypertension;obesity (BMI >30kg/m2)     Objective:     Vitals: BP 140/68 (BP Location: Right Wrist, Patient Position: Sitting)   Pulse 78   Temp 98.3 F (36.8 C)   Resp 16   Ht 5\' 3"  (1.6 m)   Wt (!) 339 lb 9.6 oz (154 kg)   LMP  (LMP Unknown)   BMI 60.16 kg/m   Body mass index is 60.16 kg/m.   Tobacco History  Smoking Status  . Never Smoker  Smokeless Tobacco  . Never Used     Counseling given: Not Answered   Past Medical History:  Diagnosis Date  . Allergy   . Arthritis   . Asthma   . Chronic fatigue syndrome   . Fractured fibula   . GERD (gastroesophageal reflux disease)   . Hypertension   . Lymphedema 2004  . Neuromuscular disorder (HCC)   . Pneumonia    Past Surgical History:  Procedure Laterality Date  . ABDOMINAL HYSTERECTOMY  1974  . BIOPSY THYROID    . CHOLECYSTECTOMY  2005   Family History  Problem Relation Age of Onset  . Heart disease Mother   . Hyperlipidemia Mother   . Diabetes Father   . Alcoholism Father   . Cancer Brother   . Hypertension Son   . Stroke Maternal Grandmother    History  Sexual Activity  . Sexual activity: Yes    Outpatient Encounter Prescriptions as of 11/08/2016  Medication Sig  . albuterol (PROVENTIL HFA;VENTOLIN HFA) 108 (90 Base) MCG/ACT inhaler Inhale 2 puffs into the lungs every 6 (six) hours as needed for wheezing or shortness of breath.  . Apple Cider Vinegar 500 MG TABS Take 500 mg by mouth 4 (four) times daily.  Marland Kitchen aspirin 81 MG tablet Take 81 mg by mouth daily.  Marland Kitchen buPROPion (WELLBUTRIN SR) 150 MG 12 hr tablet TAKE 1 TABLET BY MOUTH EVERY 12 HOURS  . BYSTOLIC 5 MG tablet TAKE 1 TABLET(5 MG) BY MOUTH DAILY (Patient taking differently: take 1/2 tablet daily)  . Cholecalciferol (VITAMIN D-3 PO) Take by  mouth daily.  . Fluticasone-Salmeterol (ADVAIR DISKUS) 250-50 MCG/DOSE AEPB Inhale 1 puff into the lungs 2 (two) times daily.  . furosemide (LASIX) 40 MG tablet Take 1 tablet (40 mg total) by mouth daily as needed.  . Glucos-Chond-Hyal Ac-Ca Fructo (MOVE FREE JOINT HEALTH ADVANCE PO) Take by mouth daily.  . magnesium oxide (MAG-OX) 400 MG tablet Take 400 mg by mouth daily.  . mupirocin cream (BACTROBAN) 2 % APPLY EXTERNALLY TO THE AFFECTED AREA TWICE DAILY  . OIL OF OREGANO PO Take 40 mg by mouth daily.  Marland Kitchen omeprazole (PRILOSEC) 20 MG capsule Take 1 capsule (20 mg total) by mouth daily.  . Silver-Carboxymethylcellulose (AQUACEL-AG EXTRA HYDROFIBER) 2"X2" PADS Use daily prn  . triamcinolone cream (KENALOG) 0.1 % Apply 1 application topically 2 (two) times daily.  . vitamin E 400 UNIT capsule Take 400 Units by mouth daily.  Marland Kitchen atorvastatin (LIPITOR) 10 MG tablet Take 1 tablet (10 mg total) by mouth daily. (Patient not taking: Reported on 11/08/2016)  . LORazepam (ATIVAN) 1 MG tablet Take 1 tablet (1 mg total) by mouth daily as needed for anxiety. 1/2-1 tab (Patient not taking: Reported on 11/08/2016)  . traMADol (ULTRAM) 50 MG tablet TK 1 T PO  Q 6 H PRN P   Facility-Administered Encounter Medications as of 11/08/2016  Medication  . albuterol (PROVENTIL) (2.5 MG/3ML) 0.083% nebulizer solution 2.5 mg    Activities of Daily Living In your present state of health, do you have any difficulty performing the following activities: 11/08/2016  Hearing? Y  Vision? N  Difficulty concentrating or making decisions? N  Walking or climbing stairs? Y  Dressing or bathing? N  Doing errands, shopping? N  Preparing Food and eating ? N  Using the Toilet? N  In the past six months, have you accidently leaked urine? N  Do you have problems with loss of bowel control? N  Managing your Medications? N  Managing your Finances? N  Housekeeping or managing your Housekeeping? N  Some recent data might be hidden     Patient Care Team: Gabriel CirriWicker, [name], NP as PCP - General (Nurse Practitioner)    Assessment:     Exercise Activities and Dietary recommendations    Goals    . Increase water intake          Recommend drinking at least 4-5 glasses of water a day      Fall Risk Fall Risk  11/08/2016 05/17/2016 02/21/2016 11/06/2015 12/05/2014  Falls in the past year? No No No No No   Depression Screen PHQ 2/9 Scores 11/08/2016 07/10/2016 02/21/2016 11/06/2015  PHQ - 2 Score 0 0 1 3  PHQ- 9 Score - 0 - 6     Cognitive Function     6CIT Screen 11/08/2016  What Year? 0 points  What month? 0 points  What time? 0 points  Count back from 20 0 points  Months in reverse 0 points  Repeat phrase 0 points  Total Score 0    Immunization History  Administered Date(s) Administered  . Influenza, High Dose Seasonal PF 02/21/2016  . Influenza,inj,Quad PF,36+ Mos 02/22/2015  . Influenza-Unspecified 02/16/2014  . Pneumococcal Conjugate-13 08/31/2013  . Td 08/31/2013  . Zoster 11/12/2011   Screening Tests Health Maintenance  Topic Date Due  . INFLUENZA VACCINE  12/18/2016  . TETANUS/TDAP  09/01/2023  . DEXA SCAN  Completed  . PNA vac Low Risk Adult  Completed      Plan:    I have personally reviewed and addressed the Medicare Annual Wellness questionnaire and have noted the following in the patient's chart:  A. Medical and social history B. Use of alcohol, tobacco or illicit drugs  C. Current medications and supplements D. Functional ability and status E.  Nutritional status F.  Physical activity G. Advance directives H. List of other physicians I.  Hospitalizations, surgeries, and ER visits in previous 12 months J.  Vitals K. Screenings such as hearing and vision if needed, cognitive and depression L. Referrals and appointments  In addition, I have reviewed and discussed with patient certain preventive protocols, quality metrics, and best practice recommendations. A written personalized  care plan for preventive services as well as general preventive health recommendations were provided to patient.   Signed,  Marin Robertsiffany Hill, LPN Nurse Health Advisor   MD Recommendations: As discussed patient complains of itching on her upper back that started Wednesday, she has used her creams that were previously prescribed with no relief. States she feels her cellulitis has flared ip and requests to be seen, pt placed on schedule for today.

## 2016-11-08 NOTE — Progress Notes (Signed)
   BP 140/68   Pulse 78   Temp 98.3 F (36.8 C)   Wt (!) 339 lb 9.6 oz (154 kg)   LMP  (LMP Unknown)   BMI 60.16 kg/m    Subjective:    Patient ID: [name], female    DOB: 06/02/1940, 76 y.o.   MRN: [id number]  HPI: [name] is a 76 y.o. female  Chief Complaint  Patient presents with  . Cellulitis   Pt is here for complaints of right thoracic pain.  Pt states I typically give her Clarithromyacin and it improves.  Pt would like another prescription.    Relevant past medical, surgical, family and social history reviewed and updated as indicated. Interim medical history since our last visit reviewed. Allergies and medications reviewed and updated.  Review of Systems  Per HPI unless specifically indicated above     Objective:    BP 140/68   Pulse 78   Temp 98.3 F (36.8 C)   Wt (!) 339 lb 9.6 oz (154 kg)   LMP  (LMP Unknown)   BMI 60.16 kg/m   Wt Readings from Last 3 Encounters:  11/08/16 (!) 339 lb 9.6 oz (154 kg)  11/08/16 (!) 339 lb 9.6 oz (154 kg)  08/21/16 (!) 334 lb 12.8 oz (151.9 kg)    Physical Exam  Constitutional: She is oriented to person, place, and time. She appears well-developed and well-nourished. No distress.  HENT:  Head: Normocephalic and atraumatic.  Eyes: Conjunctivae and lids are normal. Right eye exhibits no discharge. Left eye exhibits no discharge. No scleral icterus.  Neck: Normal range of motion. Neck supple. No JVD present. Carotid bruit is not present.  Cardiovascular: Normal rate, regular rhythm and normal heart sounds.   Pulmonary/Chest: Effort normal and breath sounds normal.  Abdominal: Normal appearance. There is no splenomegaly or hepatomegaly.  Musculoskeletal: Normal range of motion.  Neurological: She is alert and oriented to person, place, and time.  Skin: Skin is warm, dry and intact. No rash noted. No pallor.  Psychiatric: She has a normal mood and affect. Her behavior is normal. Judgment and thought content normal.     Results for orders placed or performed in visit on 08/21/16  Pathology  Result Value Ref Range   PATH REPORT.SITE OF ORIGIN SPEC Comment    . Comment    PATH REPORT.FINAL DX SPEC Comment    SIGNED OUT BY: Comment    GROSS DESCRIPTION: Comment    . Comment    PAYMENT PROCEDURE Comment       Assessment & Plan:   Problem List Items Addressed This Visit      Unprioritized   Lymphedema    I don't see a rash.  Pt states this happens a lot and relieved in the past with Clarithromyacin.  Will rx to have available over the weekend if needs it.  Valtrex to r/o shingels          Follow up plan: Return if symptoms worsen or fail to improve.

## 2016-11-08 NOTE — Patient Instructions (Addendum)
[name] , Thank you for taking time to come for your Medicare Wellness Visit. I appreciate your ongoing commitment to your health goals. Please review the following plan we discussed and let me know if I can assist you in the future.   Screening recommendations/referrals: Colonoscopy: Completed 09/27/2004, ordered cologuard  Mammogram: completed 05/21/2013, no longer required Bone Density: completed 11/29/2008 Recommended yearly ophthalmology/optometry visit for glaucoma screening and checkup Recommended yearly dental visit for hygiene and checkup  Vaccinations: Influenza vaccine: up to date, due 02/2017 Pneumococcal vaccine: up to date Tdap vaccine: up to date  Shingles vaccine: up to date  Advanced directives: Advance directive discussed with you today. Once this is complete please bring a copy in to our office so we can scan it into your chart.  Conditions/risks identified: Recommend drinking at least 4-5 glasses of water a day  Next appointment: Follow up with [name] on 01/08/2017 at 4:00pm. Follow up in one year for your annual wellness exam.   Preventive Care 65 Years and Older, Female Preventive care refers to lifestyle choices and visits with your health care provider that can promote health and wellness. What does preventive care include?  A yearly physical exam. This is also called an annual well check.  Dental exams once or twice a year.  Routine eye exams. Ask your health care provider how often you should have your eyes checked.  Personal lifestyle choices, including:  Daily care of your teeth and gums.  Regular physical activity.  Eating a healthy diet.  Avoiding tobacco and drug use.  Limiting alcohol use.  Practicing safe sex.  Taking low-dose aspirin every day.  Taking vitamin and mineral supplements as recommended by your health care provider. What happens during an annual well check? The services and screenings done by your health care provider  during your annual well check will depend on your age, overall health, lifestyle risk factors, and family history of disease. Counseling  Your health care provider may ask you questions about your:  Alcohol use.  Tobacco use.  Drug use.  Emotional well-being.  Home and relationship well-being.  Sexual activity.  Eating habits.  History of falls.  Memory and ability to understand (cognition).  Work and work Astronomerenvironment.  Reproductive health. Screening  You may have the following tests or measurements:  Height, weight, and BMI.  Blood pressure.  Lipid and cholesterol levels. These may be checked every 5 years, or more frequently if you are over 76 years old.  Skin check.  Lung cancer screening. You may have this screening every year starting at age 76 if you have a 30-pack-year history of smoking and currently smoke or have quit within the past 15 years.  Fecal occult blood test (FOBT) of the stool. You may have this test every year starting at age 76.  Flexible sigmoidoscopy or colonoscopy. You may have a sigmoidoscopy every 5 years or a colonoscopy every 10 years starting at age 76.  Hepatitis C blood test.  Hepatitis B blood test.  Sexually transmitted disease (STD) testing.  Diabetes screening. This is done by checking your blood sugar (glucose) after you have not eaten for a while (fasting). You may have this done every 1-3 years.  Bone density scan. This is done to screen for osteoporosis. You may have this done starting at age 76.  Mammogram. This may be done every 1-2 years. Talk to your health care provider about how often you should have regular mammograms. Talk with your health care  provider about your test results, treatment options, and if necessary, the need for more tests. Vaccines  Your health care provider may recommend certain vaccines, such as:  Influenza vaccine. This is recommended every year.  Tetanus, diphtheria, and acellular pertussis  (Tdap, Td) vaccine. You may need a Td booster every 10 years.  Zoster vaccine. You may need this after age 2.  Pneumococcal 13-valent conjugate (PCV13) vaccine. One dose is recommended after age 12.  Pneumococcal polysaccharide (PPSV23) vaccine. One dose is recommended after age 85. Talk to your health care provider about which screenings and vaccines you need and how often you need them. This information is not intended to replace advice given to you by your health care provider. Make sure you discuss any questions you have with your health care provider. Document Released: 06/02/2015 Document Revised: 01/24/2016 Document Reviewed: 03/07/2015 Elsevier Interactive Patient Education  2017 [place] Prevention in the Home Falls can cause injuries. They can happen to people of all ages. There are many things you can do to make your home safe and to help prevent falls. What can I do on the outside of my home?  Regularly fix the edges of walkways and driveways and fix any cracks.  Remove anything that might make you trip as you walk through a door, such as a raised step or threshold.  Trim any bushes or trees on the path to your home.  Use bright outdoor lighting.  Clear any walking paths of anything that might make someone trip, such as rocks or tools.  Regularly check to see if handrails are loose or broken. Make sure that both sides of any steps have handrails.  Any raised decks and porches should have guardrails on the edges.  Have any leaves, snow, or ice cleared regularly.  Use sand or salt on walking paths during winter.  Clean up any spills in your garage right away. This includes oil or grease spills. What can I do in the bathroom?  Use night lights.  Install grab bars by the toilet and in the tub and shower. Do not use towel bars as grab bars.  Use non-skid mats or decals in the tub or shower.  If you need to sit down in the shower, use a plastic, non-slip  stool.  Keep the floor dry. Clean up any water that spills on the floor as soon as it happens.  Remove soap buildup in the tub or shower regularly.  Attach bath mats securely with double-sided non-slip rug tape.  Do not have throw rugs and other things on the floor that can make you trip. What can I do in the bedroom?  Use night lights.  Make sure that you have a light by your bed that is easy to reach.  Do not use any sheets or blankets that are too big for your bed. They should not hang down onto the floor.  Have a firm chair that has side arms. You can use this for support while you get dressed.  Do not have throw rugs and other things on the floor that can make you trip. What can I do in the kitchen?  Clean up any spills right away.  Avoid walking on wet floors.  Keep items that you use a lot in easy-to-reach places.  If you need to reach something above you, use a strong step stool that has a grab bar.  Keep electrical cords out of the way.  Do not use floor polish  or wax that makes floors slippery. If you must use wax, use non-skid floor wax.  Do not have throw rugs and other things on the floor that can make you trip. What can I do with my stairs?  Do not leave any items on the stairs.  Make sure that there are handrails on both sides of the stairs and use them. Fix handrails that are broken or loose. Make sure that handrails are as long as the stairways.  Check any carpeting to make sure that it is firmly attached to the stairs. Fix any carpet that is loose or worn.  Avoid having throw rugs at the top or bottom of the stairs. If you do have throw rugs, attach them to the floor with carpet tape.  Make sure that you have a light switch at the top of the stairs and the bottom of the stairs. If you do not have them, ask someone to add them for you. What else can I do to help prevent falls?  Wear shoes that:  Do not have high heels.  Have rubber bottoms.  Are  comfortable and fit you well.  Are closed at the toe. Do not wear sandals.  If you use a stepladder:  Make sure that it is fully opened. Do not climb a closed stepladder.  Make sure that both sides of the stepladder are locked into place.  Ask someone to hold it for you, if possible.  Clearly mark and make sure that you can see:  Any grab bars or handrails.  First and last steps.  Where the edge of each step is.  Use tools that help you move around (mobility aids) if they are needed. These include:  Canes.  Walkers.  Scooters.  Crutches.  Turn on the lights when you go into a dark area. Replace any light bulbs as soon as they burn out.  Set up your furniture so you have a clear path. Avoid moving your furniture around.  If any of your floors are uneven, fix them.  If there are any pets around you, be aware of where they are.  Review your medicines with your doctor. Some medicines can make you feel dizzy. This can increase your chance of falling. Ask your doctor what other things that you can do to help prevent falls. This information is not intended to replace advice given to you by your health care provider. Make sure you discuss any questions you have with your health care provider. Document Released: 03/02/2009 Document Revised: 10/12/2015 Document Reviewed: 06/10/2014 Elsevier Interactive Patient Education  2017 [employer].

## 2016-11-14 ENCOUNTER — Other Ambulatory Visit: Payer: Self-pay | Admitting: Unknown Physician Specialty

## 2016-11-14 NOTE — Telephone Encounter (Signed)
Routing to provider. Appt on 01/08/17. 

## 2016-12-04 ENCOUNTER — Emergency Department
Admission: EM | Admit: 2016-12-04 | Discharge: 2016-12-04 | Disposition: A | Discharge status: Home / Self Care | Payer: Worker's Compensation | Attending: Emergency Medicine | Admitting: Emergency Medicine

## 2016-12-04 ENCOUNTER — Encounter: Payer: Self-pay | Admitting: Emergency Medicine

## 2016-12-04 DIAGNOSIS — Z91040 Latex allergy status: Secondary | ICD-10-CM | POA: Insufficient documentation

## 2016-12-04 DIAGNOSIS — S81812A Laceration without foreign body, left lower leg, initial encounter: Secondary | ICD-10-CM | POA: Insufficient documentation

## 2016-12-04 DIAGNOSIS — W2209XA Striking against other stationary object, initial encounter: Secondary | ICD-10-CM | POA: Insufficient documentation

## 2016-12-04 DIAGNOSIS — Z7982 Long term (current) use of aspirin: Secondary | ICD-10-CM | POA: Insufficient documentation

## 2016-12-04 DIAGNOSIS — J45909 Unspecified asthma, uncomplicated: Secondary | ICD-10-CM | POA: Diagnosis not present

## 2016-12-04 DIAGNOSIS — Z79899 Other long term (current) drug therapy: Secondary | ICD-10-CM | POA: Diagnosis not present

## 2016-12-04 DIAGNOSIS — Y990 Civilian activity done for income or pay: Secondary | ICD-10-CM | POA: Diagnosis not present

## 2016-12-04 DIAGNOSIS — Y939 Activity, unspecified: Secondary | ICD-10-CM | POA: Insufficient documentation

## 2016-12-04 DIAGNOSIS — Y9289 Other specified places as the place of occurrence of the external cause: Secondary | ICD-10-CM | POA: Diagnosis not present

## 2016-12-04 DIAGNOSIS — S8992XA Unspecified injury of left lower leg, initial encounter: Secondary | ICD-10-CM | POA: Diagnosis present

## 2016-12-04 DIAGNOSIS — M79605 Pain in left leg: Secondary | ICD-10-CM | POA: Diagnosis not present

## 2016-12-04 MED ORDER — LIDOCAINE-EPINEPHRINE-TETRACAINE (LET) SOLUTION
3.0000 mL | Freq: Once | NASAL | Status: DC
  Filled 2016-12-04: qty 3

## 2016-12-04 MED ORDER — LIDOCAINE HCL (PF) 1 % IJ SOLN
INTRAMUSCULAR | Status: AC
  Filled 2016-12-04: qty 5

## 2016-12-04 MED ORDER — LIDOCAINE HCL (PF) 1 % IJ SOLN: 5.0000 mL | Freq: Once | INTRAMUSCULAR | Status: DC

## 2016-12-04 NOTE — ED Provider Notes (Signed)
[facility] Emergency Department Provider Note   ____________________________________________   I have reviewed the triage vital signs and the nursing notes.   HISTORY  Chief Complaint Laceration    HPI [name] is a 76 y.o. female presents to the emergency department via EMS after she sustained approximately a 5 cm superficial laceration on along the lateral aspect of the left lower leg on a cabinet at work today. Patient maintained hemorrhage control since onset of the injury. Patient has a long history of lymphedema she maintains with compression garments and lymphatic drainage therapy. Patient reports when she does sustain an injury she does note significant increase in lymphatic flow of the lower extremity around the area of the injury. Patient denies fever, chills, headache, vision changes, chest pain, chest tightness, shortness of breath, abdominal pain, nausea and vomiting.  Past Medical History:  Diagnosis Date  . Allergy   . Arthritis   . Asthma   . Chronic fatigue syndrome   . Fractured fibula   . GERD (gastroesophageal reflux disease)   . Hypertension   . Lymphedema 2004  . Neuromuscular disorder (HCC)   . Pneumonia     Patient Active Problem List   Diagnosis Date Noted  . Aortic atherosclerosis (HCC) 08/12/2016  . Skin lesion of right arm 07/10/2016  . Bradycardia 05/24/2016  . Obesity 02/21/2016  . Mobility impaired 11/10/2015  . Depression 11/06/2015  . Seborrheic keratoses 10/11/2015  . Rotator cuff impingement syndrome of right shoulder 10/11/2015  . Cellulitis 09/19/2015  . Asthma 08/23/2015  . Lymphedema 12/05/2014  . Essential hypertension, benign 12/05/2014    Past Surgical History:  Procedure Laterality Date  . ABDOMINAL HYSTERECTOMY  1974  . BIOPSY THYROID    . CHOLECYSTECTOMY  2005    Prior to Admission medications   Medication Sig Start Date End Date Taking? Authorizing Provider  albuterol (PROVENTIL  HFA;VENTOLIN HFA) 108 (90 Base) MCG/ACT inhaler Inhale 2 puffs into the lungs every 6 (six) hours as needed for wheezing or shortness of breath. 06/19/15   Gabriel CirriWicker, [name], NP  Apple Cider Vinegar 500 MG TABS Take 500 mg by mouth 4 (four) times daily.    [provider]  aspirin 81 MG tablet Take 81 mg by mouth daily.    [provider]  atorvastatin (LIPITOR) 10 MG tablet Take 1 tablet (10 mg total) by mouth daily. 08/12/16 08/12/17  Antonieta IbaGollan, Timothy J, MD  buPROPion (WELLBUTRIN SR) 150 MG 12 hr tablet TAKE 1 TABLET BY MOUTH EVERY 12 HOURS 09/23/16   Gabriel CirriWicker, [name], NP  BYSTOLIC 5 MG tablet TAKE 1 TABLET(5 MG) BY MOUTH DAILY Patient taking differently: take 1/2 tablet daily 09/23/16   Gabriel CirriWicker, [name], NP  Cholecalciferol (VITAMIN D-3 PO) Take by mouth daily.    [provider]  clarithromycin (BIAXIN) 500 MG tablet Take 1 tablet (500 mg total) by mouth 2 (two) times daily. 11/08/16   Gabriel CirriWicker, [name], NP  Fluticasone-Salmeterol (ADVAIR DISKUS) 250-50 MCG/DOSE AEPB Inhale 1 puff into the lungs 2 (two) times daily. 08/23/15   Gabriel CirriWicker, [name], NP  furosemide (LASIX) 40 MG tablet Take 1 tablet (40 mg total) by mouth daily as needed. 08/12/16   Antonieta IbaGollan, Timothy J, MD  Glucos-Chond-Hyal Ac-Ca Fructo (MOVE FREE JOINT HEALTH ADVANCE PO) Take by mouth daily.    [provider]  LORazepam (ATIVAN) 1 MG tablet Take 1 tablet (1 mg total) by mouth daily as needed for anxiety. 1/2-1 tab 08/31/15   Steele Sizerrissman, Mark A, MD  magnesium oxide (MAG-OX) 400 MG tablet Take 400 mg by mouth daily.    [provider]  mupirocin cream (BACTROBAN) 2 % APPLY EXTERNALLY TO THE AFFECTED AREA TWICE DAILY 07/15/16   Steele Sizer, MD  OIL OF OREGANO PO Take 40 mg by mouth daily.    [provider]  omeprazole (PRILOSEC) 20 MG capsule Take 1 capsule (20 mg total) by mouth daily. 02/26/16   Gabriel Cirri, NP  Silver-Carboxymethylcellulose (AQUACEL-AG EXTRA HYDROFIBER) 2"X2" PADS Use daily prn  05/24/16   Gabriel Cirri, NP  traMADol (ULTRAM) 50 MG tablet TK 1 T PO  Q 6 H PRN P 06/07/16   [provider]  triamcinolone cream (KENALOG) 0.1 % APPLY TOPICALLY TO THE AFFECTED AREA TWICE DAILY 11/15/16   Particia Nearing, PA-C  valACYclovir (VALTREX) 1000 MG tablet Take 1 tablet (1,000 mg total) by mouth 3 (three) times daily. 11/08/16   Gabriel Cirri, NP  vitamin E 400 UNIT capsule Take 400 Units by mouth daily.    [provider]    Allergies Latex; Benicar [olmesartan]; Ciprofloxacin; Diovan [valsartan]; Flagyl [metronidazole]; Lotensin [benazepril hcl]; Penicillins; and Sulfa antibiotics  Family History  Problem Relation Age of Onset  . Heart disease Mother   . Hyperlipidemia Mother   . Diabetes Father   . Alcoholism Father   . Cancer Brother   . Hypertension Son   . Stroke Maternal Grandmother     Social History Social History  Substance Use Topics  . Smoking status: Never Smoker  . Smokeless tobacco: Never Used  . Alcohol use No    Review of Systems Constitutional: Negative for fever/chills Eyes: No visual changes. Cardiovascular: Denies chest pain. Respiratory: Denies cough. Denies shortness of breath. Musculoskeletal: Negative for back pain. Skin: Negative for rash. Laceration along the lateral aspect of the left lower leg with hemorrhage control. Neurological: Negative for headaches.  Negative focal weakness or numbness. Negative for loss of consciousness. Able to ambulate. ____________________________________________   PHYSICAL EXAM:  VITAL SIGNS: ED Triage Vitals [12/04/16 1456]  Enc Vitals Group     BP (!) 154/69     Pulse Rate 61     Resp 18     Temp 97.6 F (36.4 C)     Temp Source Oral     SpO2 100 %     Weight (!) 320 lb (145.2 kg)     Height 5\' 4"  (1.626 m)     Head Circumference      Peak Flow      Pain Score      Pain Loc      Pain Edu?      Excl. in GC?     Constitutional: Alert and oriented. Well appearing and  in no acute distress.  Head: Normocephalic and atraumatic. Eyes: Conjunctivae are normal. PERRL.  Cardiovascular: Normal rate, regular rhythm. Normal distal pulses. Respiratory: Normal respiratory effort.  Gastrointestinal: Soft and nontender. No distention. Musculoskeletal: Nontender with normal range of motion in all extremities. Neurologic: Normal speech and language.  Skin:  Skin is warm, dry and intact. No rash noted. 5 cm superficial laceration along the lateral aspect of the left lower leg that creates a small skin flap at the proximal edge. Moderately complicated by history of lymphedema. Psychiatric: Mood and affect are normal.  ____________________________________________   LABS (all labs ordered are listed, but only abnormal results are displayed)  Labs Reviewed - No data to display ____________________________________________  EKG None ____________________________________________  RADIOLOGY None ____________________________________________  PROCEDURES  Procedure(s) performed: LACERATION REPAIR Performed by: Clois Comber Authorized by: Clois Comber Consent: Verbal consent obtained. Risks and benefits: risks, benefits and alternatives were discussed Consent given by: patient Patient identity confirmed: provided demographic data Prepped and Draped in normal sterile fashion Wound explored  Laceration Location: Lateral aspect of the left lower leg  Laceration Length: 5.0 cm with small skin flap at proximal and  No Foreign Bodies seen or palpated  Anesthesia: local infiltration  Local anesthetic: LET and lidocaine 1%   Anesthetic total: LET 3.0 ml/Lidocaine 5.0 ml  Irrigation method: syringe Amount of cleaning: standard  Skin closure: Ethilon 5-0   Number of sutures: 14 sutures   Technique: Simple interrupted   Patient tolerance: Patient tolerated the procedure well with no immediate complications.    Critical Care performed:  no ____________________________________________   INITIAL IMPRESSION / ASSESSMENT AND PLAN / ED COURSE  Pertinent labs & imaging results that were available during my care of the patient were reviewed by me and considered in my medical decision making (see chart for details).   Patient sustained a laceration along the lateral aspect of the left lower leg.  Assessment confirmed movement and sensation along the left lower leg and wound area before and after wound closure. Laceration required suture closure as noted above. Patient tolerated procedure well. Pt instructed to keep wound clean and dry. Instructed patient to return to her primary care doctor or doctor monitoring her lymphedema or the emergency department in 2 days for wound recheck. Patient and family verbalized understanding of the importance of returning for reassessment. Patient also instructed to watch for signs of infection and return if changes are noted. Patient informed of clinical course, understand medical decision-making process, and agree with plan.  Patient was also advised to return to the emergency department for symptoms that change or worsen.    ____________________________________________   FINAL CLINICAL IMPRESSION(S) / ED DIAGNOSES  Final diagnoses:  Laceration of left lower extremity, initial encounter       NEW MEDICATIONS STARTED DURING THIS VISIT:  Discharge Medication List as of 12/04/2016  5:12 PM       Note:  This document was prepared using Dragon voice recognition software and may include unintentional dictation errors.    Clois Comber, PA-C 12/04/16 1833    Rockne Menghini, MD 12/09/16 [phone]

## 2016-12-04 NOTE — ED Notes (Signed)
See this RN's triage note. Lac to L shin at this time.

## 2016-12-04 NOTE — Discharge Instructions (Signed)
Monitor the wound for signs of infection. Keep wound clean and dry and covered during her mobility activities. When at home keep extremity elevated. Return for wound recheck with your primary care provider or return to the emergency department this Friday, 12/06/2016.

## 2016-12-04 NOTE — ED Triage Notes (Signed)
Pt presents to ED via [facility] with laceration to LLE. Per EMS pt uses motor scooter for stability purposes and caught her leg on a cabinet today while at work. Pt presents with approx 2 in laceration to L shin, bleeding controlled at this time.

## 2016-12-06 ENCOUNTER — Ambulatory Visit (INDEPENDENT_AMBULATORY_CARE_PROVIDER_SITE_OTHER): Payer: BC Managed Care – PPO | Admitting: Unknown Physician Specialty

## 2016-12-06 ENCOUNTER — Encounter: Payer: Self-pay | Admitting: Unknown Physician Specialty

## 2016-12-06 VITALS — BP 127/84 | HR 59 | Temp 98.3°F | Wt 344.4 lb

## 2016-12-06 DIAGNOSIS — L02416 Cutaneous abscess of left lower limb: Secondary | ICD-10-CM | POA: Diagnosis not present

## 2016-12-06 DIAGNOSIS — S81812D Laceration without foreign body, left lower leg, subsequent encounter: Secondary | ICD-10-CM | POA: Diagnosis not present

## 2016-12-06 DIAGNOSIS — I890 Lymphedema, not elsewhere classified: Secondary | ICD-10-CM

## 2016-12-06 MED ORDER — CLARITHROMYCIN 500 MG PO TABS: 500.0000 mg | ORAL_TABLET | Freq: Two times a day (BID) | ORAL | 0 refills | Status: DC

## 2016-12-06 NOTE — Assessment & Plan Note (Signed)
Ongoing and chronic problem

## 2016-12-06 NOTE — Progress Notes (Signed)
BP 127/84   Pulse (!) 59   Temp 98.3 F (36.8 C)   Wt (!) 344 lb 6.4 oz (156.2 kg)   LMP  (LMP Unknown)   SpO2 98%   BMI 59.12 kg/m    Subjective:    Patient ID: [name], female    DOB: 09/03/1940, 76 y.o.   MRN: [id number]  HPI: [name] is a 76 y.o. female  Chief Complaint  Patient presents with  . Hospitalization Follow-up   Leg wound Pt with known lymphedema sustained a left lower leg wound 2 days ago.  Sutured in the ER and here for f/u.  Pt has an additional abcess on the back of that same leg she has been applying silver   Relevant past medical, surgical, family and social history reviewed and updated as indicated. Interim medical history since our last visit reviewed. Allergies and medications reviewed and updated.  Review of Systems  Per HPI unless specifically indicated above     Objective:    BP 127/84   Pulse (!) 59   Temp 98.3 F (36.8 C)   Wt (!) 344 lb 6.4 oz (156.2 kg)   LMP  (LMP Unknown)   SpO2 98%   BMI 59.12 kg/m   Wt Readings from Last 3 Encounters:  12/06/16 (!) 344 lb 6.4 oz (156.2 kg)  12/04/16 (!) 320 lb (145.2 kg)  11/08/16 (!) 339 lb 9.6 oz (154 kg)    Physical Exam  Constitutional: She is oriented to person, place, and time. She appears well-developed and well-nourished. No distress.  HENT:  Head: Normocephalic and atraumatic.  Eyes: Conjunctivae and lids are normal. Right eye exhibits no discharge. Left eye exhibits no discharge. No scleral icterus.  Cardiovascular: Normal rate.   Pulmonary/Chest: Effort normal.  Abdominal: Normal appearance. There is no splenomegaly or hepatomegaly.  Musculoskeletal:  Left leg with about 12 sutures on left lower leg and edges well approximated.  She does have a dime sized area of induration noted back of that same leg.  No ulceration  Neurological: She is alert and oriented to person, place, and time.  Skin: Skin is intact. No rash noted. No pallor.  Psychiatric: She has a normal mood  and affect. Her behavior is normal. Judgment and thought content normal.    Results for orders placed or performed in visit on 08/21/16  Pathology  Result Value Ref Range   . Comment    . Comment    . Comment    . Comment    . Comment    . Comment    . Comment       Assessment & Plan:   Problem List Items Addressed This Visit      Unprioritized   Lymphedema    Ongoing and chronic problem      Relevant Orders   Ambulatory referral to Wound Clinic    Other Visit Diagnoses    Laceration of left lower extremity, subsequent encounter    -  Primary   Healing will.  Remove sutures next week   Relevant Orders   Ambulatory referral to Wound Clinic   Abscess of left lower leg       Rx for Biaxin 500 mg BID.  This is the only antibiotic she can take   Relevant Orders   Ambulatory referral to Wound Clinic      Wound clinic referral if laceration does not continue to heal well  Follow up plan: Return for next week  for suture removal.

## 2016-12-11 ENCOUNTER — Ambulatory Visit (INDEPENDENT_AMBULATORY_CARE_PROVIDER_SITE_OTHER): Payer: BC Managed Care – PPO | Admitting: Unknown Physician Specialty

## 2016-12-11 ENCOUNTER — Encounter: Payer: Self-pay | Admitting: Unknown Physician Specialty

## 2016-12-11 VITALS — BP 141/73 | HR 71 | Temp 98.0°F | Wt 343.0 lb

## 2016-12-11 DIAGNOSIS — L03116 Cellulitis of left lower limb: Secondary | ICD-10-CM

## 2016-12-11 MED ORDER — CLARITHROMYCIN 500 MG PO TABS: 500.0000 mg | ORAL_TABLET | Freq: Two times a day (BID) | ORAL | 0 refills | Status: DC

## 2016-12-11 NOTE — Progress Notes (Signed)
   BP (!) 141/73   Pulse 71   Temp 98 F (36.7 C)   Wt (!) 343 lb (155.6 kg)   LMP  (LMP Unknown)   SpO2 96%   BMI 58.88 kg/m    Subjective:    Patient ID: [name], female    DOB: 05/16/1941, 76 y.o.   MRN: [id number]  HPI: [name] is a 76 y.o. female  Chief Complaint  Patient presents with  . Wound Check  . Foot Itching    pt states the top of both of her feet have been itching since this past weekend   Cellulitis Pt with left lower leg cellulitis secondary to an injury seen last week.  Sutures are still in.  This morning she states the redness was better.  She went to work today though and it is swollen this AM.  Wound clinic appt on August 5th.  Taking Biaxin which is the one antibiotic she can tolerate.  She does feel she might be running a fever as normal temperature is 96.7      Relevant past medical, surgical, family and social history reviewed and updated as indicated. Interim medical history since our last visit reviewed. Allergies and medications reviewed and updated.  Review of Systems  Per HPI unless specifically indicated above     Objective:    BP (!) 141/73   Pulse 71   Temp 98 F (36.7 C)   Wt (!) 343 lb (155.6 kg)   LMP  (LMP Unknown)   SpO2 96%   BMI 58.88 kg/m   Wt Readings from Last 3 Encounters:  12/11/16 (!) 343 lb (155.6 kg)  12/06/16 (!) 344 lb 6.4 oz (156.2 kg)  12/04/16 (!) 320 lb (145.2 kg)    Physical Exam  Constitutional: She appears well-developed and well-nourished.  Cardiovascular: Normal rate.   Pulmonary/Chest: Effort normal.  Musculoskeletal:  Left lower extremity erythemetous.  Wearing support stockings.  Stitches in for 2 week and I feel they will be difficult to remove later.  Removed 14 stitches.  There is some opening of the wound.  Closed with steri strips.    Skin: Skin is warm.   WBC is normal Results for orders placed or performed in visit on 08/21/16  Pathology  Result Value Ref Range   . Comment    .  Comment    . Comment    . Comment    . Comment    . Comment    . Comment       Assessment & Plan:   Problem List Items Addressed This Visit      Unprioritized   Cellulitis - Primary   Relevant Orders   CBC With Differential/Platelet    Discussed with pt she must stay with legs elevated.     Follow up plan: Return in about 2 days (around 12/13/2016).  To change steri strips

## 2016-12-12 LAB — CBC WITH DIFFERENTIAL/PLATELET
Hematocrit: 43.5 % (ref 34.0–46.6)
Hemoglobin: 14.6 g/dL (ref 11.1–15.9)
Lymphocytes Absolute: 2.4 x10E3/uL (ref 0.7–3.1)
Lymphs: 42 %
MCH: 30.7 pg (ref 26.6–33.0)
MCHC: 33.6 g/dL (ref 31.5–35.7)
MCV: 91 fL (ref 79–97)
MID (Absolute): 0.5 X10E3/uL (ref 0.1–1.6)
MID: 8 %
Neutrophils Absolute: 2.8 x10E3/uL (ref 1.4–7.0)
Neutrophils: 50 %
Platelets: 290 x10E3/uL (ref 150–379)
RBC: 4.76 x10E6/uL (ref 3.77–5.28)
RDW: 14 % (ref 12.3–15.4)
WBC: 5.7 x10E3/uL (ref 3.4–10.8)

## 2016-12-13 ENCOUNTER — Encounter: Payer: Self-pay | Admitting: Unknown Physician Specialty

## 2016-12-13 ENCOUNTER — Ambulatory Visit (INDEPENDENT_AMBULATORY_CARE_PROVIDER_SITE_OTHER): Payer: BC Managed Care – PPO | Admitting: Unknown Physician Specialty

## 2016-12-13 DIAGNOSIS — L03116 Cellulitis of left lower limb: Secondary | ICD-10-CM

## 2016-12-13 NOTE — Assessment & Plan Note (Signed)
Improved.  Continue present care and keep steri strips in place until wound clinic appointment

## 2016-12-13 NOTE — Progress Notes (Signed)
   BP (!) 160/75   Pulse 64   Temp 97.8 F (36.6 C)   Wt (!) 339 lb 6.4 oz (154 kg)   LMP  (LMP Unknown)   SpO2 98%   BMI 58.26 kg/m    Subjective:    Patient ID: [name], female    DOB: 04/05/1941, 76 y.o.   MRN: [id number]  HPI: [name] is a 76 y.o. female  Chief Complaint  Patient presents with  . Wound Check    2 day f/up   Pt is here for f/u of left lower leg wound following suture removal.  She has kept the leg elevated and taking Biaxin.    Relevant past medical, surgical, family and social history reviewed and updated as indicated. Interim medical history since our last visit reviewed. Allergies and medications reviewed and updated.  Review of Systems  Per HPI unless specifically indicated above     Objective:    BP (!) 160/75   Pulse 64   Temp 97.8 F (36.6 C)   Wt (!) 339 lb 6.4 oz (154 kg)   LMP  (LMP Unknown)   SpO2 98%   BMI 58.26 kg/m   Wt Readings from Last 3 Encounters:  12/13/16 (!) 339 lb 6.4 oz (154 kg)  12/11/16 (!) 343 lb (155.6 kg)  12/06/16 (!) 344 lb 6.4 oz (156.2 kg)    Physical Exam  Constitutional: She is oriented to person, place, and time. She appears well-developed and well-nourished. No distress.  HENT:  Head: Normocephalic and atraumatic.  Eyes: Conjunctivae and lids are normal. Right eye exhibits no discharge. Left eye exhibits no discharge. No scleral icterus.  Cardiovascular: Normal rate.   Pulmonary/Chest: Effort normal.  Abdominal: Normal appearance. There is no splenomegaly or hepatomegaly.  Musculoskeletal: Normal range of motion.  Neurological: She is alert and oriented to person, place, and time.  Skin: Skin is intact.  Left lower leg steri-strips in place.  Leg is less swollen but still erythemetous and puffy right side with some warmth  Psychiatric: She has a normal mood and affect. Her behavior is normal. Judgment and thought content normal.    Results for orders placed or performed in visit on 12/11/16    CBC With Differential/Platelet  Result Value Ref Range   WBC 5.7 3.4 - 10.8 x10E3/uL   RBC 4.76 3.77 - 5.28 x10E6/uL   Hemoglobin 14.6 11.1 - 15.9 g/dL   Hematocrit 40.943.5 81.134.0 - 46.6 %   MCV 91 79 - 97 fL   MCH 30.7 26.6 - 33.0 pg   MCHC 33.6 31.5 - 35.7 g/dL   RDW 91.414.0 78.212.3 - 95.615.4 %   Platelets 290 150 - 379 x10E3/uL   Neutrophils 50 Not Estab. %   Lymphs 42 Not Estab. %   MID 8 Not Estab. %   Neutrophils Absolute 2.8 1.4 - 7.0 x10E3/uL   Lymphocytes Absolute 2.4 0.7 - 3.1 x10E3/uL   MID (Absolute) 0.5 0.1 - 1.6 X10E3/uL      Assessment & Plan:   Problem List Items Addressed This Visit      Unprioritized   Cellulitis    Improved.  Continue present care and keep steri strips in place until wound clinic appointment          Follow up plan: No Follow-up on file.

## 2016-12-16 ENCOUNTER — Ambulatory Visit: Payer: BC Managed Care – PPO | Admitting: Unknown Physician Specialty

## 2016-12-18 ENCOUNTER — Encounter: Payer: BC Managed Care – PPO | Attending: Internal Medicine | Admitting: Internal Medicine

## 2016-12-18 DIAGNOSIS — X58XXXD Exposure to other specified factors, subsequent encounter: Secondary | ICD-10-CM | POA: Diagnosis not present

## 2016-12-18 DIAGNOSIS — M109 Gout, unspecified: Secondary | ICD-10-CM | POA: Diagnosis not present

## 2016-12-18 DIAGNOSIS — I87322 Chronic venous hypertension (idiopathic) with inflammation of left lower extremity: Secondary | ICD-10-CM | POA: Diagnosis not present

## 2016-12-18 DIAGNOSIS — Z882 Allergy status to sulfonamides status: Secondary | ICD-10-CM | POA: Insufficient documentation

## 2016-12-18 DIAGNOSIS — S81812D Laceration without foreign body, left lower leg, subsequent encounter: Secondary | ICD-10-CM | POA: Insufficient documentation

## 2016-12-18 DIAGNOSIS — L97822 Non-pressure chronic ulcer of other part of left lower leg with fat layer exposed: Secondary | ICD-10-CM | POA: Diagnosis not present

## 2016-12-18 DIAGNOSIS — I890 Lymphedema, not elsewhere classified: Secondary | ICD-10-CM | POA: Insufficient documentation

## 2016-12-18 DIAGNOSIS — J45909 Unspecified asthma, uncomplicated: Secondary | ICD-10-CM | POA: Diagnosis not present

## 2016-12-18 DIAGNOSIS — I872 Venous insufficiency (chronic) (peripheral): Secondary | ICD-10-CM | POA: Diagnosis not present

## 2016-12-18 DIAGNOSIS — I10 Essential (primary) hypertension: Secondary | ICD-10-CM | POA: Diagnosis not present

## 2016-12-18 DIAGNOSIS — Z880 Allergy status to penicillin: Secondary | ICD-10-CM | POA: Diagnosis not present

## 2016-12-20 DIAGNOSIS — I87322 Chronic venous hypertension (idiopathic) with inflammation of left lower extremity: Secondary | ICD-10-CM | POA: Diagnosis not present

## 2016-12-20 DIAGNOSIS — M109 Gout, unspecified: Secondary | ICD-10-CM | POA: Diagnosis not present

## 2016-12-20 DIAGNOSIS — I10 Essential (primary) hypertension: Secondary | ICD-10-CM | POA: Diagnosis not present

## 2016-12-20 DIAGNOSIS — I890 Lymphedema, not elsewhere classified: Secondary | ICD-10-CM | POA: Diagnosis not present

## 2016-12-20 DIAGNOSIS — J45909 Unspecified asthma, uncomplicated: Secondary | ICD-10-CM | POA: Diagnosis not present

## 2016-12-20 DIAGNOSIS — S81812D Laceration without foreign body, left lower leg, subsequent encounter: Secondary | ICD-10-CM | POA: Diagnosis not present

## 2016-12-20 NOTE — Progress Notes (Signed)
[name], [name] ([id number]) Visit Report for 12/18/2016 Abuse/Suicide Risk Screen Details Patient Name: [name], [name] Date of Service: 12/18/2016 8:00 AM Medical Record Patient Account Number: [id number] Number: Treating RN: Curtis SitesDorthy, Joanna 06/01/1940 (76 y.o. Other Clinician: Date of Birth/Sex: Female) Treating ROBSON, MICHAEL Primary Care Rhayne Chatwin: Gabriel CirriWICKER, [name] Terry Abila/Extender: G Referring Olsen Mccutchan: Alfredo MartinezWICKER, [name] Weeks in Treatment: 0 Abuse/Suicide Risk Screen Items Answer ABUSE/SUICIDE RISK SCREEN: Has anyone close to you tried to hurt or harm you recentlyo No Do you feel uncomfortable with anyone in your familyo No Has anyone forced you do things that you didnot want to doo No Do you have any thoughts of harming yourselfo No Patient displays signs or symptoms of abuse and/or neglect. No Electronic Signature(s) Signed: 12/18/2016 5:04:35 PM By: Curtis Sitesorthy, Joanna Entered By: Curtis Sitesorthy, Joanna on 12/18/2016 08:12:00 Begay, Barbara SearingBETTY M. ([id number]) -------------------------------------------------------------------------------- Activities of Daily Living Details Patient Name: [name], [name] Date of Service: 12/18/2016 8:00 AM Medical Record Patient Account Number: [id number] Number: Treating RN: Curtis SitesDorthy, Joanna 06/25/1940 (76 y.o. Other Clinician: Date of Birth/Sex: Female) Treating ROBSON, MICHAEL Primary Care Kelisha Dall: Gabriel CirriWICKER, [name] Alexi Dorminey/Extender: G Referring Abb Gobert: Alfredo MartinezWICKER, [name] Weeks in Treatment: 0 Activities of Daily Living Items Answer Activities of Daily Living (Please select one for each item) Drive Automobile Completely Able Take Medications Completely Able Use Telephone Completely Able Care for Appearance Completely Able Use Toilet Completely Able Bath / Shower Completely Able Dress Self Completely Able Feed Self Completely Able Walk Completely Able Get In / Out Bed Completely Able Housework Completely Able Prepare Meals Completely Able Handle  Money Completely Able Shop for Self Completely Able Electronic Signature(s) Signed: 12/18/2016 5:04:35 PM By: Curtis Sitesorthy, Joanna Entered By: Curtis Sitesorthy, Joanna on 12/18/2016 08:12:16 [name], Barbara SearingBETTY M. ([id number]) -------------------------------------------------------------------------------- Education Assessment Details Patient Name: [name], [name] Date of Service: 12/18/2016 8:00 AM Medical Record Patient Account Number: [id number] Number: Treating RN: Curtis Sitesorthy, Joanna 01/14/1941 (76 y.o. Other Clinician: Date of Birth/Sex: Female) Treating ROBSON, MICHAEL Primary Care Kyler Lerette: Gabriel CirriWICKER, [name] Llesenia Fogal/Extender: G Referring Alysiah Suppa: Alfredo MartinezWICKER, [name] Weeks in Treatment: 0 Primary Learner Assessed: Patient Learning Preferences/Education Level/Primary Language Learning Preference: Explanation, Demonstration Highest Education Level: College or Above Preferred Language: English Cognitive Barrier Assessment/Beliefs Language Barrier: No Translator Needed: No Memory Deficit: No Emotional Barrier: No Cultural/Religious Beliefs Affecting Medical No Care: Physical Barrier Assessment Impaired Vision: No Impaired Hearing: No Decreased Hand dexterity: No Knowledge/Comprehension Assessment Knowledge Level: Medium Comprehension Level: Medium Ability to understand written Medium instructions: Ability to understand verbal Medium instructions: Motivation Assessment Anxiety Level: Calm Cooperation: Cooperative Education Importance: Acknowledges Need Interest in Health Problems: Asks Questions Perception: Coherent Willingness to Engage in Self- Medium Management Activities: Readiness to Engage in Self- Medium Management Activities: [name], Kimyatta M. ([id number]) Electronic Signature(s) Signed: 12/18/2016 5:04:35 PM By: Curtis Sitesorthy, Joanna Entered By: Curtis Sitesorthy, Joanna on 12/18/2016 08:19:26 [name], Barbara SearingBETTY M.  ([id number]) -------------------------------------------------------------------------------- Fall Risk Assessment Details Patient Name: [name], [name] Date of Service: 12/18/2016 8:00 AM Medical Record Patient Account Number: [id number] Number: Treating RN: Curtis SitesDorthy, Joanna 09/28/1940 (76 y.o. Other Clinician: Date of Birth/Sex: Female) Treating ROBSON, MICHAEL Primary Care Haevyn Ury: Gabriel CirriWICKER, [name] Annisten [place]/Extender: G Referring Cam Dauphin: Alfredo MartinezWICKER, [name] Weeks in Treatment: 0 Fall Risk Assessment Items Have you had 2 or more falls in the last 12 monthso 0 No Have you had any fall that resulted in injury in the last 12 monthso 0 No FALL RISK ASSESSMENT: History of falling - immediate or within 3 months 0 No Secondary diagnosis 0 No Ambulatory aid None/bed rest/wheelchair/nurse 0 No  Crutches/cane/walker 15 Yes Furniture 0 No IV Access/Saline Lock 0 No Gait/Training Normal/bed rest/immobile 0 No Weak 10 Yes Impaired 0 No Mental Status Oriented to own ability 0 Yes Electronic Signature(s) Signed: 12/18/2016 5:04:35 PM By: Curtis Sitesorthy, Joanna Entered By: Curtis Sitesorthy, Joanna on 12/18/2016 08:12:39 [name], Barbara SearingBETTY M. ([id number]) -------------------------------------------------------------------------------- Foot Assessment Details Patient Name: [name], [name] Date of Service: 12/18/2016 8:00 AM Medical Record Patient Account Number: [id number] Number: Treating RN: Curtis SitesDorthy, Joanna 02/21/1941 (76 y.o. Other Clinician: Date of Birth/Sex: Female) Treating ROBSON, MICHAEL Primary Care Deangela Randleman: Gabriel CirriWICKER, [name] Terrion Gencarelli/Extender: G Referring Bertil Brickey: Alfredo MartinezWICKER, [name] Weeks in Treatment: 0 Foot Assessment Items Site Locations + = Sensation present, - = Sensation absent, C = Callus, U = Ulcer R = Redness, W = Warmth, M = Maceration, PU = Pre-ulcerative lesion F = Fissure, S = Swelling, D = Dryness Assessment Right: Left: Other Deformity: No No Prior Foot Ulcer: No No Prior Amputation:  No No Charcot Joint: No No Ambulatory Status: Ambulatory With Help Assistance Device: Walker Gait: Steady Electronic Signature(s) Signed: 12/18/2016 5:04:35 PM By: Curtis Sitesorthy, Joanna Entered By: Curtis Sitesorthy, Joanna on 12/18/2016 08:29:02 Holloway, Barbara SearingBETTY M. ([id number]) Grassia, Barbara SearingBETTY M. ([id number]) -------------------------------------------------------------------------------- Nutrition Risk Assessment Details Patient Name: [name], [name] Date of Service: 12/18/2016 8:00 AM Medical Record Patient Account Number: [id number] Number: Treating RN: Curtis Sitesorthy, Joanna 10/06/1940 (76 y.o. Other Clinician: Date of Birth/Sex: Female) Treating ROBSON, MICHAEL Primary Care Nazar Kuan: Gabriel CirriWICKER, [name] Tavien Chestnut/Extender: G Referring Eliu Batch: Alfredo MartinezWICKER, [name] Weeks in Treatment: 0 Height (in): 66 Weight (lbs): 363 Body Mass Index (BMI): 58.6 Nutrition Risk Assessment Items NUTRITION RISK SCREEN: I have an illness or condition that made me change the kind and/or 0 No amount of food I eat I eat fewer than two meals per day 0 No I eat few fruits and vegetables, or milk products 0 No I have three or more drinks of beer, liquor or wine almost every day 0 No I have tooth or mouth problems that make it hard for me to eat 0 No I don't always have enough money to buy the food I need 0 No I eat alone most of the time 0 No I take three or more different prescribed or over-the-counter drugs a 1 Yes day Without wanting to, I have lost or gained 10 pounds in the last six 0 No months I am not always physically able to shop, cook and/or feed myself 0 No Nutrition Protocols Good Risk Protocol 0 No interventions needed Moderate Risk Protocol Electronic Signature(s) Signed: 12/18/2016 5:04:35 PM By: Curtis Sitesorthy, Joanna Entered By: Curtis Sitesorthy, Joanna on 12/18/2016 08:12:46

## 2016-12-20 NOTE — Progress Notes (Signed)
ELVIS, BOOT ([id number]) Visit Report for 12/18/2016 Chief Complaint Document Details Patient Name: [name], [name]. Date of Service: 12/18/2016 8:00 AM Medical Record Patient Account Number: [id number] Number: Treating RN: Curtis Sites [birth year] (76 y.o. Other Clinician: Date of Birth/Sex: Female) Treating ROBSON, MICHAEL Primary Care Provider: Gabriel Cirri Provider/Extender: G Referring Provider: Alfredo Martinez in Treatment: 0 Information Obtained from: Patient Chief Complaint bilateral lower extremity edema. right post calf ulcer. 12/18/16; patient is here for a laceration injury on the left anterior lower leg Electronic Signature(s) Signed: 12/19/2016 3:50:17 PM By: Baltazar Najjar MD Entered By: Baltazar Najjar on 12/18/2016 09:11:35 Hyslop, [name] ([id number]) -------------------------------------------------------------------------------- Debridement Details Patient Name: [name] Date of Service: 12/18/2016 8:00 AM Medical Record Patient Account Number: [id number] Number: Treating RN: Curtis Sites 29-Jul-1940 (76 y.o. Other Clinician: Date of Birth/Sex: Female) Treating ROBSON, MICHAEL Primary Care Provider: Gabriel Cirri Provider/Extender: G Referring Provider: Alfredo Martinez in Treatment: 0 Debridement Performed for Wound #3 Left,Anterior Lower Leg Assessment: Performed By: Physician Maxwell Caul, MD Debridement: Debridement Severity of Tissue Pre Fat layer exposed Debridement: Pre-procedure Verification/Time Out Yes - 08:46 Taken: Start Time: 08:46 Pain Control: Lidocaine 4% Topical Solution Level: Skin/Subcutaneous Tissue Total Area Debrided (L x 1 (cm) x 3.8 (cm) = 3.8 (cm) W): Tissue and other Viable, Non-Viable, Blood Clots, Eschar, Fibrin/Slough, Subcutaneous material debrided: Instrument: Curette Bleeding: Minimum Hemostasis Achieved: Pressure End Time: 08:50 Procedural Pain: 0 Post Procedural Pain:  0 Response to Treatment: Procedure was tolerated well Post Debridement Measurements of Total Wound Length: (cm) 1 Width: (cm) 3.8 Depth: (cm) 0.8 Volume: (cm) 2.388 Character of Wound/Ulcer Post Improved Debridement: Severity of Tissue Post Debridement: Fat layer exposed Post Procedure Diagnosis Same as Pre-procedure Electronic Signature(s) FLANNERY, CAVALLERO ([id number]) Signed: 12/18/2016 5:04:35 PM By: Curtis Sites Signed: 12/19/2016 3:50:17 PM By: Baltazar Najjar MD Entered By: Baltazar Najjar on 12/18/2016 09:11:07 Hutchins, [name] ([id number]) -------------------------------------------------------------------------------- HPI Details Patient Name: [name] Date of Service: 12/18/2016 8:00 AM Medical Record Patient Account Number: [id number] Number: Treating RN: Curtis Sites [birth year] (76 y.o. Other Clinician: Date of Birth/Sex: Female) Treating ROBSON, MICHAEL Primary Care Provider: Gabriel Cirri Provider/Extender: G Referring Provider: Alfredo Martinez in Treatment: 0 History of Present Illness Location: The patient has bilateral lower extremities swelling. she does have a small wound on the lower posterior aspect of her right lower extremity. Severity: the patient swelling and erythema has been moderate. Duration: she has had the lower extremity swelling for about 14 years now. She's noticed erythema in the last week or so. Context: marked lymphedema bilateral Modifying Factors: she has a medical grade compression stockings at home but she has never worn them because that she feels they're too tight. Associated Signs and Symptoms: she denies any fevers. HPI Description: The patient is a 76 year old female who presents with bilateral lower extremity swelling with right post calf ulcer and erythema. She has a long history of lower extremity swelling. She first noticed it in 2001. The patient returns for follow-up today. Her RLE erythema has  subsided. 12/18/16; READMISSION this is a patient who is been in this clinic previously I think for a wound on the right lower extremity. She has a history of severe bilateral lower stamina and the lymphedema and also chronic venous insufficiency with inflammation [stasis dermatitis]. She wears 20-30 mm below-knee compression stockings and she is compliant with these. She also has external compression pumps at home but does not use these in fact she  states that she doesn't think they actually work because of problems with the Velcro. 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 This immediately opened up and started bleeding. She was seen in the emergency room on 12/04/16 and she had 14 sutures placed. This was followed by primary care felt to have coexistent cellulitis. The sutures were removed on 11/21/16 and the wound was Steri-Stripped although the tissue has not adhered and she has an open wound currently. Lab work on 12/13/16 showed a white count of 5.7. At some point she was felt to have cellulitis and started on Biaxin she is allergic to penicillin, quinolones and sulfonamides. She has not been systemically unwell. She did not tolerate compression last time and tells me she had to come in every 3 days to have a rewrapped done. I've talked to her about this and said the best thing we can do is see her back once for a nurse visit. I have emphasized the need to get the swelling out of the left leg to give this wound the best chance to heal we could not obtain ABIs in this clinic as the patient could not tolerate the compression [name], [name] ([id number]) Electronic Signature(s) Signed: 12/19/2016 3:50:17 PM By: Baltazar Najjarobson, Michael MD Entered By: Baltazar Najjarobson, Michael on 12/18/2016 09:15:31 Flaharty, Barbara SearingBETTY M. ([id number]) -------------------------------------------------------------------------------- Physical Exam  Details Patient Name: [name], [name] Date of Service: 12/18/2016 8:00 AM Medical Record Patient Account Number: [id number] Number: Treating RN: Curtis Sitesorthy, [name] 12/04/1940 (76 y.o. Other Clinician: Date of Birth/Sex: Female) Treating ROBSON, MICHAEL Primary Care Provider: Gabriel CirriWICKER, [name] Provider/Extender: G Referring Provider: Alfredo MartinezWICKER, [name] Weeks in Treatment: 0 Constitutional Patient is hypertensive.. Pulse regular and within target range for patient.Marland Kitchen. Respirations regular, non-labored and within target range.. Temperature is normal and within the target range for the patient.Marland Kitchen. appears in no distress. Eyes Conjunctivae clear. No discharge. Respiratory Respiratory effort is easy and symmetric bilaterally. Rate is normal at rest and on room air.. Bilateral breath sounds are clear and equal in all lobes with no wheezes, rales or rhonchi.. Cardiovascular Heart rhythm and rate regular, without murmur or gallop. No evidence of CHF. Femoral arteries without bruits and pulses strong.. Pedal pulses palpable and strong bilaterally.. Edema present in both extremities. This is severe stage III lymphedema.. Gastrointestinal (GI) Abdomen is soft and non-distended without masses or tenderness. Bowel sounds active in all quadrants.. Lymphatic None palpable in the left popliteal or inguinal area. Integumentary (Hair, Skin) Patient has significant anterior erythema over a large part of her left lower leg over this is nontender and not warm suggesting that at least currently this is venous inflammation and not cellulitis. Psychiatric No evidence of depression, anxiety, or agitation. Calm, cooperative, and communicative. Appropriate interactions and affect.. Notes Wound exam; horizontal laceration in the left mid lower extremity. As noted there is surrounding erythema but no tenderness and very little warmth. All of this suggestive that this is venous and formation rather than cellulitis. The  wound itself has a surface of necrotic debris. Using a #3 curet we did a light debridement in order to allow the patient to tolerated. She does have tunneling medially however I think this is only 3 or 4 mm. The wound is open to muscle. The underlying surface of this looks healthy. Not surprisingly she does have weeping edema fluid Electronic Signature(s) Signed: 12/19/2016 3:50:17 PM By: Baltazar Najjarobson, Michael MD Entered By:  Baltazar Najjarobson, Michael on 12/18/2016 09:18:41 Loncar, Barbara SearingBETTY M. ([id number]) [name], Cybele M. ([id number]) -------------------------------------------------------------------------------- Physician Orders Details Patient Name: [name], Eveny M. Date of Service: 12/18/2016 8:00 AM Medical Record Patient Account Number: [id number] Number: Treating RN: Curtis SitesDorthy, [name] 12/07/1940 (76 y.o. Other Clinician: Date of Birth/Sex: Female) Treating ROBSON, MICHAEL Primary Care Provider: Gabriel CirriWICKER, [name] Provider/Extender: G Referring Provider: Alfredo MartinezWICKER, [name] Weeks in Treatment: 0 Verbal / Phone Orders: No Diagnosis Coding Wound Cleansing Wound #3 Left,Anterior Lower Leg o Clean wound with Normal Saline. o May shower with protection. Anesthetic Wound #3 Left,Anterior Lower Leg o Topical Lidocaine 4% cream applied to wound bed prior to debridement Skin Barriers/Peri-Wound Care Wound #3 Left,Anterior Lower Leg o Triamcinolone Acetonide Ointment Primary Wound Dressing Wound #3 Left,Anterior Lower Leg o Prisma Ag Secondary Dressing Wound #3 Left,Anterior Lower Leg o ABD pad o XtraSorb Dressing Change Frequency Wound #3 Left,Anterior Lower Leg o Other: - twice weekly Follow-up Appointments Wound #3 Left,Anterior Lower Leg o Return Appointment in 1 week. o Nurse Visit as needed Edema Control Wound #3 Left,Anterior Lower Leg [name], [name] ([id number]) o 3 Layer Compression System - Left Lower Extremity o Elevate legs to the level of the heart and pump ankles as  often as possible Additional Orders / Instructions Wound #3 Left,Anterior Lower Leg o Increase protein intake. Electronic Signature(s) Signed: 12/18/2016 5:04:35 PM By: Curtis Sitesorthy, [name] Signed: 12/19/2016 3:50:17 PM By: Baltazar Najjarobson, Michael MD Entered By: Curtis Sitesorthy, [name] on 12/18/2016 08:54:46 Goodness, Barbara SearingBETTY M. ([id number]) -------------------------------------------------------------------------------- Problem List Details Patient Name: [name], Ileana M. Date of Service: 12/18/2016 8:00 AM Medical Record Patient Account Number: [id number] Number: Treating RN: Curtis SitesDorthy, [name] 01/08/1941 (76 y.o. Other Clinician: Date of Birth/Sex: Female) Treating ROBSON, MICHAEL Primary Care Provider: Gabriel CirriWICKER, [name] Provider/Extender: G Referring Provider: Alfredo MartinezWICKER, [name] Weeks in Treatment: 0 Active Problems ICD-10 Encounter Code Description Active Date Diagnosis S81.812D Laceration without foreign body, left lower leg, subsequent 12/18/2016 Yes encounter I89.0 Lymphedema, not elsewhere classified 12/18/2016 Yes I87.322 Chronic venous hypertension (idiopathic) with 12/18/2016 Yes inflammation of left lower extremity Inactive Problems Resolved Problems Electronic Signature(s) Signed: 12/19/2016 3:50:17 PM By: Baltazar Najjarobson, Michael MD Entered By: Baltazar Najjarobson, Michael on 12/18/2016 09:10:46 Majkowski, Barbara SearingBETTY M. ([id number]) -------------------------------------------------------------------------------- Progress Note Details Patient Name: [name], [name] Date of Service: 12/18/2016 8:00 AM Medical Record Patient Account Number: [id number] Number: Treating RN: Curtis Sitesorthy, [name] 02/14/1941 (76 y.o. Other Clinician: Date of Birth/Sex: Female) Treating ROBSON, MICHAEL Primary Care Provider: Gabriel CirriWICKER, [name] Provider/Extender: G Referring Provider: Alfredo MartinezWICKER, [name] Weeks in Treatment: 0 Subjective Chief Complaint Information obtained from Patient bilateral lower extremity edema. right post calf ulcer. 12/18/16; patient is here  for a laceration injury on the left anterior lower leg History of Present Illness (HPI) The following HPI elements were documented for the patient's wound: Location: The patient has bilateral lower extremities swelling. she does have a small wound on the lower posterior aspect of her right lower extremity. Severity: the patient swelling and erythema has been moderate. Duration: she has had the lower extremity swelling for about 14 years now. She's noticed erythema in the last week or so. Context: marked lymphedema bilateral Modifying Factors: she has a medical grade compression stockings at home but she has never worn them because that she feels they're too tight. Associated Signs and Symptoms: she denies any fevers. The patient is a 76 year old female who presents with bilateral lower extremity swelling with right post calf ulcer and erythema. She has a long history of lower extremity swelling. She first noticed it in 2001. The  patient returns for follow-up today. Her RLE erythema has subsided. 12/18/16; READMISSION this is a patient who is been in this clinic previously I think for a wound on the right lower extremity. She has a history of severe bilateral lower stamina and the lymphedema and also chronic venous insufficiency with inflammation [stasis dermatitis]. She wears 20-30 mm below-knee compression stockings and she is compliant with these. She also has external compression pumps at home but does not use these in fact she states that she doesn't think they actually work because of problems with the Velcro. 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 This immediately opened up and started bleeding. She was seen in the emergency room on 12/04/16 and she had 14 sutures placed. This was followed by primary care felt to have coexistent cellulitis. The sutures were removed on 11/21/16 and the  wound was Steri-Stripped although the tissue has not adhered and she has an open wound currently. Lab work on 12/13/16 showed a white count of 5.7. At some point she was felt to have cellulitis and started on Biaxin she is allergic to penicillin, quinolones and sulfonamides. She has not been systemically unwell. BLUE, RUGGERIO ([id number]) She did not tolerate compression last time and tells me she had to come in every 3 days to have a rewrapped done. I've talked to her about this and said the best thing we can do is see her back once for a nurse visit. I have emphasized the need to get the swelling out of the left leg to give this wound the best chance to heal we could not obtain ABIs in this clinic as the patient could not tolerate the compression Wound History Patient presents with 1 open wound that has been present for approximately 2 weeks. Patient has been treating wound in the following manner: bandaid. Laboratory tests have not been performed in the last month. Patient reportedly has not tested positive for an antibiotic resistant organism. Patient reportedly has not tested positive for osteomyelitis. Patient reportedly has had testing performed to evaluate circulation in the legs. Patient experiences the following problems associated with their wounds: swelling. Patient History Information obtained from Patient. Allergies Flagyl, Diovan (Reaction: fatigue), Penicillins, Lotensin (Reaction: cough), Sulfa (Sulfonamide Antibiotics), Benicar, diltiazem HCl, Cipro, Coreg Family History Cancer - Siblings, Diabetes - Father, Heart Disease - Mother, Hereditary Spherocytosis - Father, Hypertension - Father, Mother, Stroke - Maternal Grandparents, No family history of Kidney Disease, Lung Disease, Seizures, Thyroid Problems, Tuberculosis. Social History Never smoker, Marital Status - Married, Alcohol Use - Never, Drug Use - No History, Caffeine Use - Daily  - coffee. Objective Constitutional Patient is hypertensive.. Pulse regular and within target range for patient.Marland Kitchen Respirations regular, non-labored and within target range.. Temperature is normal and within the target range for the patient.Marland Kitchen appears in no distress. Vitals Time Taken: 8:15 AM, Height: 63 in, Source: Measured, Weight: 331 lbs, Source: Measured, BMI: Strother, Brittnye M. ([id number]) 58.6, Temperature: 97.8 F, Pulse: 61 bpm, Respiratory Rate: 16 breaths/min, Blood Pressure: 164/77 mmHg. Eyes Conjunctivae clear. No discharge. Respiratory Respiratory effort is easy and symmetric bilaterally. Rate is normal at rest and on room air.. Bilateral breath sounds are clear and equal in all lobes with no wheezes, rales or rhonchi.. Cardiovascular Heart rhythm and rate regular, without murmur or gallop. No evidence of CHF. Femoral arteries without bruits and pulses strong.. Pedal pulses palpable and  strong bilaterally.. Edema present in both extremities. This is severe stage III lymphedema.. Gastrointestinal (GI) Abdomen is soft and non-distended without masses or tenderness. Bowel sounds active in all quadrants.. Lymphatic None palpable in the left popliteal or inguinal area. Psychiatric No evidence of depression, anxiety, or agitation. Calm, cooperative, and communicative. Appropriate interactions and affect.. General Notes: Wound exam; horizontal laceration in the left mid lower extremity. As noted there is surrounding erythema but no tenderness and very little warmth. All of this suggestive that this is venous and formation rather than cellulitis. The wound itself has a surface of necrotic debris. Using a #3 curet we did a light debridement in order to allow the patient to tolerated. She does have tunneling medially however I think this is only 3 or 4 mm. The wound is open to muscle. The underlying surface of this looks healthy. Not surprisingly she does have weeping edema  fluid Integumentary (Hair, Skin) Patient has significant anterior erythema over a large part of her left lower leg over this is nontender and not warm suggesting that at least currently this is venous inflammation and not cellulitis. Wound #3 status is Open. Original cause of wound was Trauma. The wound is located on the Left,Anterior Lower Leg. The wound measures 1cm length x 3.8cm width x 0.6cm depth; 2.985cm^2 area and 1.791cm^3 volume. There is Fat Layer (Subcutaneous Tissue) Exposed exposed. There is no tunneling or undermining noted. There is a large amount of serous drainage noted. The wound margin is flat and intact. There is no granulation within the wound bed. There is a large (67-100%) amount of necrotic tissue within the wound bed including Eschar and Adherent Slough. The periwound skin appearance exhibited: Maceration. The periwound skin appearance did not exhibit: Callus, Crepitus, Excoriation, Induration, Rash, Scarring, Dry/Scaly, Atrophie Blanche, Cyanosis, Ecchymosis, Hemosiderin Staining, Mottled, Pallor, Rubor, Erythema. Periwound temperature was noted as No Abnormality. The periwound has tenderness on palpation. CLARISSA, LAIRD ([id number]) Assessment Active Problems ICD-10 S81.812D - Laceration without foreign body, left lower leg, subsequent encounter I89.0 - Lymphedema, not elsewhere classified I87.322 - Chronic venous hypertension (idiopathic) with inflammation of left lower extremity Procedures Wound #3 Pre-procedure diagnosis of Wound #3 is a Venous Leg Ulcer located on the Left,Anterior Lower Leg .Severity of Tissue Pre Debridement is: Fat layer exposed. There was a Skin/Subcutaneous Tissue Debridement ([id number]) debridement with total area of 3.8 sq cm performed by Maxwell Caul, MD. with the following instrument(s): Curette to remove Viable and Non-Viable tissue/material including Blood Clots, Fibrin/Slough, Eschar, and Subcutaneous after achieving pain  control using Lidocaine 4% Topical Solution. A time out was conducted at 08:46, prior to the start of the procedure. A Minimum amount of bleeding was controlled with Pressure. The procedure was tolerated well with a pain level of 0 throughout and a pain level of 0 following the procedure. Post Debridement Measurements: 1cm length x 3.8cm width x 0.8cm depth; 2.388cm^3 volume. Character of Wound/Ulcer Post Debridement is improved. Severity of Tissue Post Debridement is: Fat layer exposed. Post procedure Diagnosis Wound #3: Same as Pre-Procedure Plan Wound Cleansing: Wound #3 Left,Anterior Lower Leg: Clean wound with Normal Saline. May shower with protection. Anesthetic: Wound #3 Left,Anterior Lower Leg: Topical Lidocaine 4% cream applied to wound bed prior to debridement Skin Barriers/Peri-Wound Care: Wound #3 Left,Anterior Lower Leg: Triamcinolone Acetonide Ointment Fambro, [name] ([id number]) Primary Wound Dressing: Wound #3 Left,Anterior Lower Leg: Prisma Ag Secondary Dressing: Wound #3 Left,Anterior Lower Leg: ABD pad XtraSorb Dressing Change Frequency: Wound #3 Left,Anterior Lower  Leg: Other: - twice weekly Follow-up Appointments: Wound #3 Left,Anterior Lower Leg: Return Appointment in 1 week. Nurse Visit as needed Edema Control: Wound #3 Left,Anterior Lower Leg: 3 Layer Compression System - Left Lower Extremity Elevate legs to the level of the heart and pump ankles as often as possible Additional Orders / Instructions: Wound #3 Left,Anterior Lower Leg: Increase protein intake. #1 laceration-type wound on the left lateral leg in the setting of lymphedema and chronic venous inflammation. #2 we used silver collagen to the wound #3 I spent a long time talking to the patient about compression in this leg. Without some control of her edema healing could be significantly delayed. She expressed understanding. In discussion with her I am not exactly sure why she has so much  trouble with this both in terms of previous wraps and lymphedema pumps. I suspect she may have restless leg syndrome and generalized anxiety. #4 I am hopeful that at least she'll be able to keep the compression on long enough for [place] to change it once in the clinic. Her 20-30 mm stockings certainly are not going to control the edema for purposes of healing a wound #5 she tells me she did see vascular surgery in the past and was told that her arterial status was good we'll see if we can find these records however based on clinical exam I thought we can get away with 3 layer equivalent compression. The patient does not complain of claudication although she is reasonably immobile using power chairs at work and at home. She does have a walker however As far as I can see this patient was seen previously in this clinic however this was not part of our current physician provider group [Dr. Lucianne Muss TANETTA, FUHRIMAN ([id number]) Electronic Signature(s) Signed: 12/19/2016 3:50:17 PM By: Baltazar Najjar MD Entered By: Baltazar Najjar on 12/18/2016 09:23:49 Ono, [name] ([id number]) -------------------------------------------------------------------------------- ROS/PFSH Details Patient Name: [name] Date of Service: 12/18/2016 8:00 AM Medical Record Patient Account Number: [id number] Number: Treating RN: Curtis Sites 1940-09-21 (76 y.o. Other Clinician: Date of Birth/Sex: Female) Treating ROBSON, MICHAEL Primary Care Provider: Gabriel Cirri Provider/Extender: G Referring Provider: Alfredo Martinez in Treatment: 0 Information Obtained From Patient Wound History Do you currently have one or more open woundso Yes How many open wounds do you currently haveo 1 Approximately how long have you had your woundso 2 weeks How have you been treating your wound(s) until nowo bandaid Has your wound(s) ever healed and then re-openedo No Have you had any lab work done in the past montho  No Have you tested positive for an antibiotic resistant organism (MRSA, VRE)o No Have you tested positive for osteomyelitis (bone infection)o No Have you had any tests for circulation on your legso Yes Who ordered the testo Lost [facility] Where was the test doneo [facility] Have you had other problems associated with your woundso Swelling Eyes Medical History: Positive for: Cataracts Negative for: Glaucoma; Optic Neuritis Ear/Nose/Mouth/Throat Medical History: Negative for: Chronic sinus problems/congestion Hematologic/Lymphatic Medical History: Negative for: Anemia; Hemophilia; Human Immunodeficiency Virus; Lymphedema; Sickle Cell Disease Respiratory Medical History: Positive for: Asthma Cardiovascular ALESANDRA, SMART. ([id number]) Medical History: Positive for: Arrhythmia; Hypertension Gastrointestinal Medical History: Negative for: Cirrhosis ; Colitis; Crohnos; Hepatitis A; Hepatitis B; Hepatitis C Endocrine Medical History: Negative for: Type I Diabetes; Type II Diabetes Genitourinary Medical History: Negative for: End Stage Renal Disease Immunological Medical History: Negative for: Lupus Erythematosus; Raynaudos; Scleroderma Integumentary (Skin) Medical History: Negative for: History of Burn; History  of pressure wounds Musculoskeletal Medical History: Positive for: Gout Negative for: Rheumatoid Arthritis; Osteoarthritis; Osteomyelitis Neurologic Medical History: Negative for: Dementia; Neuropathy; Quadriplegia; Paraplegia; Seizure Disorder Oncologic Medical History: Negative for: Received Chemotherapy; Received Radiation Psychiatric Medical History: Negative for: Anorexia/bulimia; Confinement Anxiety HBO Extended History Items Eyes: Cataracts Hickam, Kylyn M. ([id number]) Immunizations Pneumococcal Vaccine: Received Pneumococcal Vaccination: Yes Family and Social History Cancer: Yes - Siblings; Diabetes: Yes - Father; Heart Disease: Yes - Mother; Hereditary  Spherocytosis: Yes - Father; Hypertension: Yes - Father, Mother; Kidney Disease: No; Lung Disease: No; Seizures: No; Stroke: Yes - Maternal Grandparents; Thyroid Problems: No; Tuberculosis: No; Never smoker; Marital Status - Married; Alcohol Use: Never; Drug Use: No History; Caffeine Use: Daily - coffee; Financial Concerns: No; Food, Clothing or Shelter Needs: No; Support System Lacking: No; Transportation Concerns: No; Advanced Directives: No; Patient does not want information on Advanced Directives; Living Will: No Electronic Signature(s) Signed: 12/18/2016 5:04:35 PM By: Curtis Sites Signed: 12/19/2016 3:50:17 PM By: Baltazar Najjar MD Entered By: Curtis Sites on 12/18/2016 08:48:51 [place], [name] ([id number]) -------------------------------------------------------------------------------- SuperBill Details Patient Name: [name] Date of Service: 12/18/2016 Medical Record Patient Account Number: [id number] Number: Treating RN: Curtis Sites 25-Oct-1940 (76 y.o. Other Clinician: Date of Birth/Sex: Female) Treating ROBSON, MICHAEL Primary Care Provider: Gabriel Cirri Provider/Extender: G Referring Provider: Alfredo Martinez in Treatment: 0 Diagnosis Coding ICD-10 Codes Code Description S81.812D Laceration without foreign body, left lower leg, subsequent encounter I89.0 Lymphedema, not elsewhere classified I87.322 Chronic venous hypertension (idiopathic) with inflammation of left lower extremity Facility Procedures CPT4: Description Modifier Quantity Code 78295621 99213 - WOUND CARE VISIT-LEV 3 EST PT 1 CPT4: 30865784 11042 - DEB SUBQ TISSUE 20 SQ CM/< 1 ICD-10 Description Diagnosis S81.812D Laceration without foreign body, left lower leg, subsequent encounter I87.322 Chronic venous hypertension (idiopathic) with inflammation of left lower extremity Physician Procedures CPT4: Description Modifier Quantity Code 6962952 99204 - WC PHYS LEVEL 4 - NEW PT 25 1 ICD-10  Description Diagnosis S81.812D Laceration without foreign body, left lower leg, subsequent encounter I89.0 Lymphedema, not elsewhere classified I87.322 Chronic  venous hypertension (idiopathic) with inflammation of left lower extremity CPT4: 8413244 11042 - WC PHYS SUBQ TISS 20 SQ CM 1 ICD-10 Description Diagnosis S81.812D Laceration without foreign body, left lower leg, subsequent encounter I87.322 ALYSSHA, HOUSH ([id number]) Electronic Signature(s) Signed: 12/18/2016 11:18:41 AM By: Curtis Sites Signed: 12/19/2016 3:50:17 PM By: Baltazar Najjar MD Entered By: Curtis Sites on 12/18/2016 11:18:40

## 2016-12-22 NOTE — Progress Notes (Signed)
[name], Niza M. ([id number]) Visit Report for 12/20/2016 Arrival Information Details Patient Name: [name], [name] Date of Service: 12/20/2016 12:30 PM Medical Record Number: [id number] Patient Account Number: [id number] Date of Birth/Sex: 10/20/1940 (76 y.o. Female) Treating RN: Ashok CordiaPinkerton, Debi Primary Care Cid Agena: Gabriel CirriWICKER, [name] Other Clinician: Referring Sakiyah Shur: Gabriel CirriWICKER, [name] Treating Brynna Dobos/Extender: Rudene ReBritto, Errol Weeks in Treatment: 0 Visit Information History Since Last Visit All ordered tests and consults were completed: No Patient Arrived: [name] Added or deleted any medications: No Arrival Time: 12:37 Any new allergies or adverse reactions: No Accompanied By: self Had a fall or experienced change in No Transfer Assistance: [employer] activities of daily living that may affect Patient Lift risk of falls: Patient Identification Verified: Yes Signs or symptoms of abuse/neglect since last No Secondary Verification Process Yes visito Completed: Hospitalized since last visit: No Patient Requires Transmission- No Has Dressing in Place as Prescribed: Yes Based Precautions: Has Compression in Place as Prescribed: Yes Patient Has Alerts: No Pain Present Now: No Electronic Signature(s) Signed: 12/20/2016 4:34:22 PM By: Alejandro MullingPinkerton, Debra Entered By: Alejandro MullingPinkerton, Debra on 12/20/2016 13:02:20 Naraine, [name] ([id number]) -------------------------------------------------------------------------------- Encounter Discharge Information Details Patient Name: [name], [name] Date of Service: 12/20/2016 12:30 PM Medical Record Number: [id number] Patient Account Number: [id number] Date of Birth/Sex: 04/26/1941 (76 y.o. Female) Treating RN: Ashok CordiaPinkerton, Debi Primary Care Trell Secrist: Gabriel CirriWICKER, [name] Other Clinician: Referring Mateus Rewerts: Gabriel CirriWICKER, [name] Treating Kimerly Rowand/Extender: Rudene ReBritto, Errol Weeks in Treatment: 0 Encounter Discharge Information Items Discharge Pain Level: 0 Discharge Condition:  Stable Ambulatory Status: Walker Discharge Destination: Home Private Transportation: Auto Accompanied By: self Schedule Follow-up Appointment: Yes Medication Reconciliation completed and No provided to Patient/Care Azaya Goedde: Clinical Summary of Care: Electronic Signature(s) Signed: 12/20/2016 4:34:22 PM By: Alejandro MullingPinkerton, Debra Entered By: Alejandro MullingPinkerton, Debra on 12/20/2016 13:03:44 Schreur, [name] ([id number]) -------------------------------------------------------------------------------- Patient/Caregiver Education Details Patient Name: [name], [name] Date of Service: 12/20/2016 12:30 PM Medical Record Number: [id number] Patient Account Number: [id number] Date of Birth/Gender: 01/09/1941 (76 y.o. Female) Treating RN: Ashok CordiaPinkerton, Debi Primary Care Physician: Gabriel CirriWICKER, [name] Other Clinician: Referring Physician: Gabriel CirriWICKER, [name] Treating Physician/Extender: Rudene ReBritto, Errol Weeks in Treatment: 0 Education Assessment Education Provided To: Patient Education Topics Provided Wound/Skin Impairment: Handouts: Other: do not get dressing wet Methods: Demonstration, Explain/Verbal Responses: State content correctly Electronic Signature(s) Signed: 12/20/2016 4:34:22 PM By: Alejandro MullingPinkerton, Debra Entered By: Alejandro MullingPinkerton, Debra on 12/20/2016 13:03:30 [place], [name] ([id number]) -------------------------------------------------------------------------------- Wound Assessment Details Patient Name: [name], [name] Date of Service: 12/20/2016 12:30 PM Medical Record Number: [id number] Patient Account Number: [id number] Date of Birth/Sex: 05/15/1941 (76 y.o. Female) Treating RN: Ashok CordiaPinkerton, Debi Primary Care Brennan Litzinger: Gabriel CirriWICKER, [name] Other Clinician: Referring Arnetia Bronk: Gabriel CirriWICKER, [name] Treating Johnanthony Wilden/Extender: Rudene ReBritto, Errol Weeks in Treatment: 0 Wound Status Wound Number: 3 Primary Venous Leg Ulcer Etiology: Wound Location: Left Lower Leg - Anterior Wound Open Wounding Event: Trauma Status: Date Acquired:  12/04/2016 Comorbid Cataracts, Asthma, Arrhythmia, Weeks Of Treatment: 0 History: Hypertension, Gout Clustered Wound: No Photos Photo Uploaded By: Alejandro MullingPinkerton, Debra on 12/20/2016 14:23:19 Wound Measurements Length: (cm) 1 Width: (cm) 3.8 Depth: (cm) 0.6 Area: (cm) 2.985 Volume: (cm) 1.791 % Reduction in Area: 0% % Reduction in Volume: 0% Epithelialization: None Tunneling: No Undermining: No Wound Description Full Thickness With Exposed Classification: Support Structures Wound Margin: Flat and Intact Exudate Large Amount: Exudate Type: Serous Exudate Color: amber Foul Odor After Cleansing: No Slough/Fibrino Yes Wound Bed Granulation Amount: Small (1-33%) Exposed Structure Granulation Quality: Red, Pink Fascia Exposed: No Necrotic Amount: Large (67-100%) Fat Layer (Subcutaneous Tissue) Exposed: Yes [place], [name]. ([id number]) Necrotic Quality:  Eschar, Adherent Slough Tendon Exposed: No Muscle Exposed: No Joint Exposed: No Bone Exposed: No Periwound Skin Texture Texture Color No Abnormalities Noted: No No Abnormalities Noted: No Callus: No Atrophie Blanche: No Crepitus: No Cyanosis: No Excoriation: No Ecchymosis: No Induration: No Erythema: No Rash: No Hemosiderin Staining: No Scarring: No Mottled: No Pallor: No Moisture Rubor: No No Abnormalities Noted: No Dry / Scaly: No Temperature / Pain Maceration: Yes Temperature: No Abnormality Tenderness on Palpation: Yes Wound Preparation Ulcer Cleansing: Rinsed/Irrigated with Saline Topical Anesthetic Applied: None Treatment Notes Wound #3 (Left, Anterior Lower Leg) 1. Cleansed with: Clean wound with Normal Saline Cleanse wound with antibacterial soap and water 3. Peri-wound Care: Other peri-wound care (specify in notes) 4. Dressing Applied: Prisma Ag 5. Secondary Dressing Applied ABD Pad 7. Secured with Tape 3 Layer Compression System - Left Lower Extremity Notes TCA to periwound, xtrasorb,  used kerlix instead of cotton on inside of 3-layer Electronic Signature(s) Signed: 12/20/2016 4:34:22 PM By: Alejandro MullingPinkerton, Debra Entered By: Alejandro MullingPinkerton, Debra on 12/20/2016 12:43:22

## 2016-12-23 NOTE — Progress Notes (Addendum)
[name], [name] ([id number]) Visit Report for 12/18/2016 Allergy List Details Patient Name: [name], [name]. Date of Service: 12/18/2016 8:00 AM Medical Record Number: [id number] Patient Account Number: [id number] Date of Birth/Sex: Feb 18, 1941 (76 y.o. Female) Treating RN: Curtis Sites Primary Care Bayley Yarborough: Gabriel Cirri Other Clinician: Referring Matsuko Kretz: Gabriel Cirri Treating Despina Boan/Extender: Maxwell Caul Weeks in Treatment: 0 Allergies Active Allergies Flagyl Diovan Reaction: fatigue Penicillins Lotensin Reaction: cough Sulfa (Sulfonamide Antibiotics) Benicar diltiazem HCl Cipro Coreg Allergy Notes Electronic Signature(s) Signed: 12/18/2016 5:04:35 PM By: Curtis Sites Entered By: Curtis Sites on 12/18/2016 08:11:28 [name], Barbara Searing ([id number]) -------------------------------------------------------------------------------- Arrival Information Details Patient Name: [name] Date of Service: 12/18/2016 8:00 AM Medical Record Number: [id number] Patient Account Number: [id number] Date of Birth/Sex: 1940/06/03 (76 y.o. Female) Treating RN: Curtis Sites Primary Care Akaash Vandewater: Gabriel Cirri Other Clinician: Referring Dior Stepter: Gabriel Cirri Treating Kashden Deboy/Extender: Altamese [place] in Treatment: 0 Visit Information Patient Arrived: Walker Arrival Time: 08:14 Accompanied By: self Transfer Assistance: None Patient Identification Verified: Yes Secondary Verification Process Completed: Yes History Since Last Visit Added or deleted any medications: No Any new allergies or adverse reactions: No Had a fall or experienced change in activities of daily living that may affect risk of falls: No Signs or symptoms of abuse/neglect since last visito No Hospitalized since last visit: No Has Dressing in Place as Prescribed: Yes Electronic Signature(s) Signed: 12/18/2016 5:04:35 PM By: Curtis Sites Entered By: Curtis Sites on 12/18/2016 08:15:29 [name], Barbara Searing  ([id number]) -------------------------------------------------------------------------------- Clinic Level of Care Assessment Details Patient Name: [name] Date of Service: 12/18/2016 8:00 AM Medical Record Number: [id number] Patient Account Number: [id number] Date of Birth/Sex: January 20, 1941 (76 y.o. Female) Treating RN: Curtis Sites Primary Care Nazanin Kinner: Gabriel Cirri Other Clinician: Referring Kaidon Kinker: Gabriel Cirri Treating Governor Matos/Extender: Altamese [place] in Treatment: 0 Clinic Level of Care Assessment Items TOOL 1 Quantity Score []  - Use when EandM and Procedure is performed on INITIAL visit 0 ASSESSMENTS - Nursing Assessment / Reassessment X - General Physical Exam (combine w/ comprehensive assessment (listed just 1 20 below) when performed on new pt. evals) X - Comprehensive Assessment (HX, ROS, Risk Assessments, Wounds Hx, etc.) 1 25 ASSESSMENTS - Wound and Skin Assessment / Reassessment []  - Dermatologic / Skin Assessment (not related to wound area) 0 ASSESSMENTS - Ostomy and/or Continence Assessment and Care []  - Incontinence Assessment and Management 0 []  - Ostomy Care Assessment and Management (repouching, etc.) 0 PROCESS - Coordination of Care X - Simple Patient / Family Education for ongoing care 1 15 []  - Complex (extensive) Patient / Family Education for ongoing care 0 X - Staff obtains Chiropractor, Records, Test Results / Process Orders 1 10 []  - Staff telephones HHA, Nursing Homes / Clarify orders / etc 0 []  - Routine Transfer to another Facility (non-emergent condition) 0 []  - Routine Hospital Admission (non-emergent condition) 0 X - New Admissions / Manufacturing engineer / Ordering NPWT, Apligraf, etc. 1 15 []  - Emergency Hospital Admission (emergent condition) 0 PROCESS - Special Needs []  - Pediatric / Minor Patient Management 0 []  - Isolation Patient Management 0 [name], [name] []  - Hearing / Language / Visual special needs 0 []  -  Assessment of Community assistance (transportation, D/C planning, etc.) 0 []  - Additional assistance / Altered mentation 0 []  - Support Surface(s) Assessment (bed, cushion, seat, etc.) 0 INTERVENTIONS - Miscellaneous []  - External ear exam 0 []  - Patient Transfer (multiple staff / Nurse, adult / Similar devices) 0 []  -  Simple Staple / Suture removal (25 or less) 0 []  - Complex Staple / Suture removal (26 or more) 0 []  - Hypo/Hyperglycemic Management (do not check if billed separately) 0 []  - Ankle / Brachial Index (ABI) - do not check if billed separately 0 Has the patient been seen at the hospital within the last three years: Yes Total Score: 85 Level Of Care: New/Established - Level 3 Electronic Signature(s) Signed: 12/18/2016 5:04:35 PM By: Curtis Sitesorthy, Joanna Entered By: Curtis Sitesorthy, Joanna on 12/18/2016 11:18:26 [name], Barbara SearingBETTY M. ([id number]) -------------------------------------------------------------------------------- Encounter Discharge Information Details Patient Name: [name], Shaterica M. Date of Service: 12/18/2016 8:00 AM Medical Record Number: [id number] Patient Account Number: [id number] Date of Birth/Sex: 12/05/1940 (76 y.o. Female) Treating RN: Curtis Sitesorthy, Joanna Primary Care Sala Tague: Gabriel CirriWICKER, [name] Other Clinician: Referring Tanesia Butner: Gabriel CirriWICKER, [name] Treating Patches Mcdonnell/Extender: Altamese CarolinaOBSON, MICHAEL G Weeks in Treatment: 0 Encounter Discharge Information Items Discharge Pain Level: 0 Discharge Condition: Stable Ambulatory Status: Walker Discharge Destination: Home Transportation: Private Auto Accompanied By: self Schedule Follow-up Appointment: Yes Medication Reconciliation completed No and provided to Patient/Care Aunya Lemler: Provided on Clinical Summary of Care: 12/18/2016 Form Type Recipient Paper Patient BR Electronic Signature(s) Signed: 12/18/2016 11:19:52 AM By: Curtis Sitesorthy, Joanna Previous Signature: 12/18/2016 9:23:59 AM Version By: Gwenlyn PerkingMoore, Shelia Entered By: Curtis Sitesorthy, Joanna on 12/18/2016  11:19:52 Joerger, Barbara SearingBETTY M. ([id number]) -------------------------------------------------------------------------------- Lower Extremity Assessment Details Patient Name: [name], Azura M. Date of Service: 12/18/2016 8:00 AM Medical Record Number: [id number] Patient Account Number: [id number] Date of Birth/Sex: 09/27/1940 (76 y.o. Female) Treating RN: Curtis Sitesorthy, Joanna Primary Care Wister Hoefle: Gabriel CirriWICKER, [name] Other Clinician: Referring Jenne Sellinger: Gabriel CirriWICKER, [name] Treating Arless Vineyard/Extender: Altamese CarolinaOBSON, MICHAEL G Weeks in Treatment: 0 Edema Assessment Assessed: [Left: No] [Right: No] Edema: [Left: Ye] [Right: s] Calf Left: Right: Point of Measurement: 30 cm From Medial Instep 57 cm cm Ankle Left: Right: Point of Measurement: 7 cm From Medial Instep 33 cm cm Vascular Assessment Pulses: Dorsalis Pedis Palpable: [Left:Yes] Doppler Audible: [Left:Yes] Posterior Tibial Palpable: [Left:Yes] Doppler Audible: [Left:Yes] Extremity colors, hair growth, and conditions: Extremity Color: [Left:Red] Hair Growth on Extremity: [Left:No] Temperature of Extremity: [Left:Warm] Capillary Refill: [Left:< 3 seconds] Electronic Signature(s) Signed: 12/18/2016 5:04:35 PM By: Curtis Sitesorthy, Joanna Entered By: Curtis Sitesorthy, Joanna on 12/18/2016 08:38:14 Hunnell, Barbara SearingBETTY M. ([id number]) -------------------------------------------------------------------------------- Multi Wound Chart Details Patient Name: [name], Terriann M. Date of Service: 12/18/2016 8:00 AM Medical Record Number: [id number] Patient Account Number: [id number] Date of Birth/Sex: 04/07/1941 (76 y.o. Female) Treating RN: Curtis Sitesorthy, Joanna Primary Care Corneluis Allston: Gabriel CirriWICKER, [name] Other Clinician: Referring Jeralynn Vaquera: Gabriel CirriWICKER, [name] Treating Kayde Atkerson/Extender: Altamese CarolinaOBSON, MICHAEL G Weeks in Treatment: 0 Vital Signs Height(in): 63 Pulse(bpm): 61 Weight(lbs): 331 Blood Pressure 164/77 (mmHg): Body Mass Index(BMI): 59 Temperature(F): 97.8 Respiratory Rate 16 (breaths/min): Photos: [3:No  Photos] [N/A:N/A] Wound Location: [3:Left Lower Leg - Anterior N/A] Wounding Event: [3:Trauma] [N/A:N/A] Primary Etiology: [3:Venous Leg Ulcer] [N/A:N/A] Comorbid History: [3:Cataracts, Asthma, Arrhythmia, Hypertension, Gout] [N/A:N/A] Date Acquired: [3:12/04/2016] [N/A:N/A] Weeks of Treatment: [3:0] [N/A:N/A] Wound Status: [3:Open] [N/A:N/A] Measurements L x W x D 1x3.8x0.6 [N/A:N/A] (cm) Area (cm) : [3:2.985] [N/A:N/A] Volume (cm) : [3:1.791] [N/A:N/A] Classification: [3:Full Thickness With Exposed Support Structures] [N/A:N/A] Exudate Amount: [3:Large] [N/A:N/A] Exudate Type: [3:Serous] [N/A:N/A] Exudate Color: [3:amber] [N/A:N/A] Wound Margin: [3:Flat and Intact] [N/A:N/A] Granulation Amount: [3:None Present (0%)] [N/A:N/A] Necrotic Amount: [3:Large (67-100%)] [N/A:N/A] Necrotic Tissue: [3:Eschar, Adherent Slough N/A] Exposed Structures: [3:Fat Layer (Subcutaneous N/A Tissue) Exposed: Yes Fascia: No Tendon: No Muscle: No] Joint: No Bone: No Epithelialization: None N/A N/A Periwound Skin Texture: Excoriation: No N/A N/A Induration: No Callus: No Crepitus: No Rash: No  Scarring: No Periwound Skin Maceration: Yes N/A N/A Moisture: Dry/Scaly: No Periwound Skin Color: Atrophie Blanche: No N/A N/A Cyanosis: No Ecchymosis: No Erythema: No Hemosiderin Staining: No Mottled: No Pallor: No Rubor: No Temperature: No Abnormality N/A N/A Tenderness on Yes N/A N/A Palpation: Wound Preparation: Ulcer Cleansing: N/A N/A Rinsed/Irrigated with Saline Topical Anesthetic Applied: Other: lidocaine 4% Treatment Notes Electronic Signature(s) Signed: 12/18/2016 5:04:35 PM By: Curtis Sites Entered By: Curtis Sites on 12/18/2016 08:48:08 Lona, Barbara Searing ([id number]) -------------------------------------------------------------------------------- Multi-Disciplinary Care Plan Details Patient Name: [name] Date of Service: 12/18/2016 8:00 AM Medical Record Number:  [id number] Patient Account Number: [id number] Date of Birth/Sex: 10-21-1940 (76 y.o. Female) Treating RN: Curtis Sites Primary Care Thomas Rhude: Gabriel Cirri Other Clinician: Referring Pierson Vantol: Gabriel Cirri Treating Sevana Grandinetti/Extender: Altamese [place]once in Treatment: 0 Active Inactive ` Abuse / Safety / Falls / Self Care Management Nursing Diagnoses: Potential for falls Goals: Patient will not experience any injury related to falls Date Initiated: 12/18/2016 Target Resolution Date: 01/24/2017 Goal Status: Active Interventions: Assess fall risk on admission and as needed Notes: ` Orientation to the Wound Care Program Nursing Diagnoses: Knowledge deficit related to the wound healing center program Goals: Patient/caregiver will verbalize understanding of the Wound Healing Center Program Date Initiated: 12/18/2016 Target Resolution Date: 01/24/2017 Goal Status: Active Interventions: Provide education on orientation to the wound center Notes: ` Venous Leg Ulcer Nursing Diagnoses: Actual venous Insuffiency (use after diagnosis is confirmed) DEYANI, HEGARTY ([id number]) Goals: Patient will maintain optimal edema control Date Initiated: 12/18/2016 Target Resolution Date: 01/24/2017 Goal Status: Active Interventions: Assess peripheral edema status every visit. Notes: ` Wound/Skin Impairment Nursing Diagnoses: Impaired tissue integrity Goals: Ulcer/skin breakdown will have a volume reduction of 30% by week 4 Date Initiated: 12/18/2016 Target Resolution Date: 01/24/2017 Goal Status: Active Ulcer/skin breakdown will have a volume reduction of 50% by week 8 Date Initiated: 12/18/2016 Target Resolution Date: 01/24/2017 Goal Status: Active Ulcer/skin breakdown will have a volume reduction of 80% by week 12 Date Initiated: 12/18/2016 Target Resolution Date: 01/24/2017 Goal Status: Active Ulcer/skin breakdown will heal within 14 weeks Date Initiated: 12/18/2016 Target Resolution Date:  01/24/2017 Goal Status: Active Interventions: Assess patient/caregiver ability to obtain necessary supplies Assess patient/caregiver ability to perform ulcer/skin care regimen upon admission and as needed Assess ulceration(s) every visit Notes: Electronic Signature(s) Signed: 12/18/2016 5:04:35 PM By: Curtis Sites Entered By: Curtis Sites on 12/18/2016 08:47:48 Yu, Barbara Searing ([id number]) -------------------------------------------------------------------------------- Pain Assessment Details Patient Name: [name] Date of Service: 12/18/2016 8:00 AM Medical Record Number: [id number] Patient Account Number: [id number] Date of Birth/Sex: [birth year] (76 y.o. Female) Treating RN: Curtis Sites Primary Care Vince Ainsley: Gabriel Cirri Other Clinician: Referring Sheray Grist: Gabriel Cirri Treating Indiyah Paone/Extender: Altamese [place] in Treatment: 0 Active Problems Location of Pain Severity and Description of Pain Patient Has Paino No Site Locations Pain Management and Medication Current Pain Management: Notes Topical or injectable lidocaine is offered to patient for acute pain when surgical debridement is performed. If needed, Patient is instructed to use over the counter pain medication for the following 24-48 hours after debridement. Wound care MDs do not prescribed pain medications. Patient has chronic pain or uncontrolled pain. Patient has been instructed to make an appointment with their Primary Care Physician for pain management. Electronic Signature(s) Signed: 12/18/2016 5:04:35 PM By: Curtis Sites Entered By: Curtis Sites on 12/18/2016 08:15:38 Minteer, Barbara Searing ([id number]) -------------------------------------------------------------------------------- Patient/Caregiver Education Details Patient Name: [name] Date of Service: 12/18/2016 8:00 AM Medical Record Patient Account Number:  [id number] Number: Treating RN: Curtis Sites 08/06/1940 (76 y.o. Other  Clinician: Date of Birth/Gender: Female) Treating ROBSON, MICHAEL Primary Care Physician: Gabriel Cirri Physician/Extender: G Referring Physician: Alfredo Martinez in Treatment: 0 Education Assessment Education Provided To: Patient Education Topics Provided Wound/Skin Impairment: Handouts: Other: wound care as ordered Methods: Explain/Verbal Responses: State content correctly Electronic Signature(s) Signed: 12/18/2016 5:04:35 PM By: Curtis Sites Entered By: Curtis Sites on 12/18/2016 11:20:10 Vanasten, Barbara Searing ([id number]) -------------------------------------------------------------------------------- Wound Assessment Details Patient Name: [name] Date of Service: 12/18/2016 8:00 AM Medical Record Number: [id number] Patient Account Number: [id number] Date of Birth/Sex: 29-Sep-1940 (76 y.o. Female) Treating RN: Curtis Sites Primary Care Alveda Vanhorne: Gabriel Cirri Other Clinician: Referring Anira Senegal: Gabriel Cirri Treating Aedyn Mckeon/Extender: Maxwell Caul Weeks in Treatment: 0 Wound Status Wound Number: 3 Primary Venous Leg Ulcer Etiology: Wound Location: Left Lower Leg - Anterior Wound Status: Open Wounding Event: Trauma Comorbid Cataracts, Asthma, Arrhythmia, Date Acquired: 12/04/2016 History: Hypertension, Gout Weeks Of Treatment: 0 Clustered Wound: No Photos Photo Uploaded By: Curtis Sites on 12/18/2016 16:07:17 Wound Measurements Length: (cm) 1 Width: (cm) 3.8 Depth: (cm) 0.6 Area: (cm) 2.985 Volume: (cm) 1.791 % Reduction in Area: % Reduction in Volume: Epithelialization: None Tunneling: No Undermining: No Wound Description Full Thickness With Exposed Classification: Support Structures Wound Margin: Flat and Intact Exudate Large Amount: Exudate Type: Serous Exudate Color: amber Foul Odor After Cleansing: No Slough/Fibrino Yes Wound Bed Granulation Amount: None Present (0%) Exposed Structure Necrotic Amount: Large (67-100%) Fascia  Exposed: No Necrotic Quality: Eschar, Adherent Slough Fat Layer (Subcutaneous Tissue) Exposed: Yes [place], Aziya M. ([id number]) Tendon Exposed: No Muscle Exposed: No Joint Exposed: No Bone Exposed: No Periwound Skin Texture Texture Color No Abnormalities Noted: No No Abnormalities Noted: No Callus: No Atrophie Blanche: No Crepitus: No Cyanosis: No Excoriation: No Ecchymosis: No Induration: No Erythema: No Rash: No Hemosiderin Staining: No Scarring: No Mottled: No Pallor: No Moisture Rubor: No No Abnormalities Noted: No Dry / Scaly: No Temperature / Pain Maceration: Yes Temperature: No Abnormality Tenderness on Palpation: Yes Wound Preparation Ulcer Cleansing: Rinsed/Irrigated with Saline Topical Anesthetic Applied: Other: lidocaine 4%, Electronic Signature(s) Signed: 12/18/2016 5:04:35 PM By: Curtis Sites Entered By: Curtis Sites on 12/18/2016 08:31:44 Candelas, Barbara Searing ([id number]) -------------------------------------------------------------------------------- Vitals Details Patient Name: [name] Date of Service: 12/18/2016 8:00 AM Medical Record Number: [id number] Patient Account Number: [id number] Date of Birth/Sex: 29-Sep-1940 (76 y.o. Female) Treating RN: Curtis Sites Primary Care Brandye Inthavong: Gabriel Cirri Other Clinician: Referring Wendolyn Raso: Gabriel Cirri Treating Larance Ratledge/Extender: Altamese [place] in Treatment: 0 Vital Signs Time Taken: 08:15 Temperature (F): 97.8 Height (in): 63 Pulse (bpm): 61 Source: Measured Respiratory Rate (breaths/min): 16 Weight (lbs): 331 Blood Pressure (mmHg): 164/77 Source: Measured Reference Range: 80 - 120 mg / dl Body Mass Index (BMI): 58.6 Electronic Signature(s) Signed: 12/18/2016 5:04:35 PM By: Curtis Sites Entered By: Curtis Sites on 12/18/2016 08:18:17

## 2016-12-25 ENCOUNTER — Encounter: Payer: BC Managed Care – PPO | Admitting: Physician Assistant

## 2016-12-25 DIAGNOSIS — M109 Gout, unspecified: Secondary | ICD-10-CM | POA: Diagnosis not present

## 2016-12-25 DIAGNOSIS — I10 Essential (primary) hypertension: Secondary | ICD-10-CM | POA: Diagnosis not present

## 2016-12-25 DIAGNOSIS — I87312 Chronic venous hypertension (idiopathic) with ulcer of left lower extremity: Secondary | ICD-10-CM | POA: Diagnosis not present

## 2016-12-25 DIAGNOSIS — I87322 Chronic venous hypertension (idiopathic) with inflammation of left lower extremity: Secondary | ICD-10-CM | POA: Diagnosis not present

## 2016-12-25 DIAGNOSIS — S81812D Laceration without foreign body, left lower leg, subsequent encounter: Secondary | ICD-10-CM | POA: Diagnosis not present

## 2016-12-25 DIAGNOSIS — J45909 Unspecified asthma, uncomplicated: Secondary | ICD-10-CM | POA: Diagnosis not present

## 2016-12-25 DIAGNOSIS — L97822 Non-pressure chronic ulcer of other part of left lower leg with fat layer exposed: Secondary | ICD-10-CM | POA: Diagnosis not present

## 2016-12-25 DIAGNOSIS — I890 Lymphedema, not elsewhere classified: Secondary | ICD-10-CM | POA: Diagnosis not present

## 2016-12-27 NOTE — Progress Notes (Signed)
[name], [name] ([id number]) Visit Report for 12/25/2016 Chief Complaint Document Details Patient Name: [name], [name] Date of Service: 12/25/2016 9:15 AM Medical Record Number: [id number] Patient Account Number: [id number] Date of Birth/Sex: 1940/07/23 (76 y.o. Female) Treating RN: Curtis Sites Primary Care Provider: Gabriel Cirri Other Clinician: Referring Provider: Gabriel Cirri Treating Provider/Extender: Linwood Dibbles, Truong Delcastillo Weeks in Treatment: 1 Information Obtained from: Patient Chief Complaint bilateral lower extremity edema. right post calf ulcer. 12/18/16; patient is here for a laceration injury on the left anterior lower leg Electronic Signature(s) Signed: 12/25/2016 6:07:57 PM By: Lenda Kelp PA-C Entered By: Lenda Kelp on 12/25/2016 09:57:37 Whichard, [name] ([id number]) -------------------------------------------------------------------------------- Debridement Details Patient Name: [name] Date of Service: 12/25/2016 9:15 AM Medical Record Number: [id number] Patient Account Number: [id number] Date of Birth/Sex: March 27, 1941 (76 y.o. Female) Treating RN: Curtis Sites Primary Care Provider: Gabriel Cirri Other Clinician: Referring Provider: Gabriel Cirri Treating Provider/Extender: Linwood Dibbles, Azion Centrella Weeks in Treatment: 1 Debridement Performed for Wound #3 Left,Anterior Lower Leg Assessment: Performed By: Physician STONE III, Shevonne Wolf E., PA-C Debridement: Debridement Severity of Tissue Pre Fat layer exposed Debridement: Pre-procedure Verification/Time Out Yes - 10:00 Taken: Start Time: 10:00 Pain Control: Lidocaine 4% Topical Solution Level: Skin/Subcutaneous Tissue Total Area Debrided (L x 1 (cm) x 3.6 (cm) = 3.6 (cm) W): Tissue and other Viable, Non-Viable, Eschar, Fibrin/Slough, Subcutaneous material debrided: Instrument: Curette Bleeding: Moderate Hemostasis Achieved: Pressure End Time: 10:03 Procedural Pain: 0 Post Procedural Pain: 0 Response to  Treatment: Procedure was tolerated well Post Debridement Measurements of Total Wound Length: (cm) 1 Width: (cm) 3.6 Depth: (cm) 0.7 Volume: (cm) 1.979 Character of Wound/Ulcer Post Improved Debridement: Severity of Tissue Post Debridement: Fat layer exposed Post Procedure Diagnosis Same as Pre-procedure Electronic Signature(s) Signed: 12/25/2016 5:06:47 PM By: Curtis Sites Signed: 12/25/2016 6:07:57 PM By: Orion Modest, Swan M. ([id number]) Entered By: Curtis Sites on 12/25/2016 10:03:07 [name] ([id number]) -------------------------------------------------------------------------------- HPI Details Patient Name: [name] Date of Service: 12/25/2016 9:15 AM Medical Record Number: [id number] Patient Account Number: [id number] Date of Birth/Sex: 1940-10-15 (76 y.o. Female) Treating RN: Curtis Sites Primary Care Provider: Gabriel Cirri Other Clinician: Referring Provider: Gabriel Cirri Treating Provider/Extender: Linwood Dibbles, Ipek Westra Weeks in Treatment: 1 History of Present Illness Location: The patient has bilateral lower extremities swelling. she does have a small wound on the lower posterior aspect of her right lower extremity. Severity: the patient swelling and erythema has been moderate. Duration: she has had the lower extremity swelling for about 14 years now. She's noticed erythema in the last week or so. Context: marked lymphedema bilateral Modifying Factors: she has a medical grade compression stockings at home but she has never worn them because that she feels they're too tight. Associated Signs and Symptoms: she denies any fevers. HPI Description: The patient is a 76 year old female who presents with bilateral lower extremity swelling with right post calf ulcer and erythema. She has a long history of lower extremity swelling. She first noticed it in 2001. The patient returns for follow-up today. Her RLE erythema has subsided. 12/18/16;  READMISSION this is a patient who is been in this clinic previously I think for a wound on the right lower extremity. She has a history of severe bilateral lower stamina and the lymphedema and also chronic venous insufficiency with inflammation [stasis dermatitis]. She wears 20-30 mm below-knee compression stockings and she is compliant with these. She also has external compression pumps at home but does not use these in  fact she states that she doesn't think they actually work because of problems with the Velcro. 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 This immediately opened up and started bleeding. She was seen in the emergency room on 12/04/16 and she had 14 sutures placed. This was followed by primary care felt to have coexistent cellulitis. The sutures were removed on 11/21/16 and the wound was Steri-Stripped although the tissue has not adhered and she has an open wound currently. Lab work on 12/13/16 showed a white count of 5.7. At some point she was felt to have cellulitis and started on Biaxin she is allergic to penicillin, quinolones and sulfonamides. She has not been systemically unwell. She did not tolerate compression last time and tells me she had to come in every 3 days to have a rewrapped done. I've talked to her about this and said the best thing we can do is see her back once for a nurse visit. I have emphasized the need to get the swelling out of the left leg to give this wound the best chance to heal we could not obtain ABIs in this clinic as the patient could not tolerate the compression 12/25/16 on evaluation today patient continues to have good toleration of the compression at this point. She is pleased with how things are progressing. Patient has no nausea, vomiting, or diarrhea and no fevers or chills. [name], [name] ([id number]) Electronic Signature(s) Signed: 12/25/2016  6:07:57 PM By: Lenda Kelp PA-C Entered By: Lenda Kelp on 12/25/2016 10:13:38 [name], [name] ([id number]) -------------------------------------------------------------------------------- Physical Exam Details Patient Name: [name] Date of Service: 12/25/2016 9:15 AM Medical Record Number: [id number] Patient Account Number: [id number] Date of Birth/Sex: 08-18-1940 (76 y.o. Female) Treating RN: Curtis Sites Primary Care Provider: Gabriel Cirri Other Clinician: Referring Provider: Gabriel Cirri Treating Provider/Extender: STONE III, Romello Hoehn Weeks in Treatment: 1 Constitutional Obese and well-hydrated in no acute distress. Respiratory normal breathing without difficulty. Psychiatric this patient is able to make decisions and demonstrates good insight into disease process. Alert and Oriented x 3. pleasant and cooperative. Notes This wound shows no surrounding erythema in regard to the wound bed although there is slough and necrotic tissue noted at the 9 o'clock and 3 o'clock location. Debridement was required and [facility] as well as subcutaneous tissue was removed down to a fairly decent wound bed although there is still some work to do. Electronic Signature(s) Signed: 12/25/2016 6:07:57 PM By: Lenda Kelp PA-C Entered By: Lenda Kelp on 12/25/2016 10:14:31 [name], [name] ([id number]) -------------------------------------------------------------------------------- Physician Orders Details Patient Name: [name] Date of Service: 12/25/2016 9:15 AM Medical Record Number: [id number] Patient Account Number: [id number] Date of Birth/Sex: 13-Feb-1941 (76 y.o. Female) Treating RN: Curtis Sites Primary Care Provider: Gabriel Cirri Other Clinician: Referring Provider: Gabriel Cirri Treating Provider/Extender: Linwood Dibbles, Derrius Furtick Weeks in Treatment: 1 Verbal / Phone Orders: Yes Clinician: Curtis Sites Read Back and Verified: Yes Diagnosis Coding ICD-10 Coding Code  Description S81.812D Laceration without foreign body, left lower leg, subsequent encounter I89.0 Lymphedema, not elsewhere classified I87.322 Chronic venous hypertension (idiopathic) with inflammation of left lower extremity Wound Cleansing Wound #3 Left,Anterior Lower Leg o Clean wound with Normal Saline. o May shower with protection. Anesthetic Wound #3 Left,Anterior Lower Leg o Topical Lidocaine 4% cream applied to wound bed prior to debridement Skin Barriers/Peri-Wound Care Wound #3 Left,Anterior Lower Leg o Triamcinolone  Acetonide Ointment Primary Wound Dressing Wound #3 Left,Anterior Lower Leg o Aquacel Ag Secondary Dressing Wound #3 Left,Anterior Lower Leg o ABD pad o XtraSorb Dressing Change Frequency Wound #3 Left,Anterior Lower Leg o Other: - twice weekly Follow-up Appointments Wound #3 Left,Anterior Lower Leg [name], [name] ([id number]) o Return Appointment in 1 week. o Nurse Visit as needed Edema Control Wound #3 Left,Anterior Lower Leg o 3 Layer Compression System - Left Lower Extremity o Elevate legs to the level of the heart and pump ankles as often as possible Additional Orders / Instructions Wound #3 Left,Anterior Lower Leg o Increase protein intake. Notes I'm going to switch her dressing at this point to a silver alginate dressing for the next week. We will see her for reevaluation following to see were things stand at that point. If anything worsens she will contact our office and we will see her for a re-wrap of her lower extremity once between now and her next visit next week. If anything worsens she will contact our office in the meantime. Please see above for specific wound care orders. Electronic Signature(s) Signed: 12/25/2016 6:07:57 PM By: Lenda Kelp PA-C Entered By: Lenda Kelp on 12/25/2016 10:15:29 [name], [name]  ([id number]) -------------------------------------------------------------------------------- Problem List Details Patient Name: [name] Date of Service: 12/25/2016 9:15 AM Medical Record Number: [id number] Patient Account Number: [id number] Date of Birth/Sex: 06/28/1940 (76 y.o. Female) Treating RN: Curtis Sites Primary Care Provider: Gabriel Cirri Other Clinician: Referring Provider: Gabriel Cirri Treating Provider/Extender: Linwood Dibbles, Elnor Renovato Weeks in Treatment: 1 Active Problems ICD-10 Encounter Code Description Active Date Diagnosis S81.812D Laceration without foreign body, left lower leg, subsequent 12/18/2016 Yes encounter I89.0 Lymphedema, not elsewhere classified 12/18/2016 Yes I87.322 Chronic venous hypertension (idiopathic) with 12/18/2016 Yes inflammation of left lower extremity Inactive Problems Resolved Problems Electronic Signature(s) Signed: 12/25/2016 6:07:57 PM By: Lenda Kelp PA-C Entered By: Lenda Kelp on 12/25/2016 09:57:25 [name], [name] ([id number]) -------------------------------------------------------------------------------- Progress Note Details Patient Name: [name] Date of Service: 12/25/2016 9:15 AM Medical Record Number: [id number] Patient Account Number: [id number] Date of Birth/Sex: 12-05-1940 (76 y.o. Female) Treating RN: Curtis Sites Primary Care Provider: Gabriel Cirri Other Clinician: Referring Provider: Gabriel Cirri Treating Provider/Extender: Linwood Dibbles, Stefanny Pieri Weeks in Treatment: 1 Subjective Chief Complaint Information obtained from Patient bilateral lower extremity edema. right post calf ulcer. 12/18/16; patient is here for a laceration injury on the left anterior lower leg History of Present Illness (HPI) The following HPI elements were documented for the patient's wound: Location: The patient has bilateral lower extremities swelling. she does have a small wound on the lower posterior aspect of her right lower  extremity. Severity: the patient swelling and erythema has been moderate. Duration: she has had the lower extremity swelling for about 14 years now. She's noticed erythema in the last week or so. Context: marked lymphedema bilateral Modifying Factors: she has a medical grade compression stockings at home but she has never worn them because that she feels they're too tight. Associated Signs and Symptoms: she denies any fevers. The patient is a 76 year old female who presents with bilateral lower extremity swelling with right post calf ulcer and erythema. She has a long history of lower extremity swelling. She first noticed it in 2001. The patient returns for follow-up today. Her RLE erythema has subsided. 12/18/16; READMISSION this is a patient who is been in this clinic previously I think for a wound on the right lower extremity. She has a history of severe bilateral lower  stamina and the lymphedema and also chronic venous insufficiency with inflammation [stasis dermatitis]. She wears 20-30 mm below-knee compression stockings and she is compliant with these. She also has external compression pumps at home but does not use these in fact she states that she doesn't think they actually work because of problems with the Velcro. 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 This immediately opened up and started bleeding. She was seen in the emergency room on 12/04/16 and she had 14 sutures placed. This was followed by primary care felt to have coexistent cellulitis. The sutures were removed on 11/21/16 and the wound was Steri-Stripped although the tissue has not adhered and she has an open wound currently. Lab work on 12/13/16 showed a white count of 5.7. At some point she was felt to have cellulitis and started on Biaxin she is allergic to penicillin, quinolones and sulfonamides. She has not been  systemically unwell. She did not tolerate compression last time and tells me she had to come in every 3 days to have a [name], [name] ([id number]) rewrapped done. I've talked to her about this and said the best thing we can do is see her back once for a nurse visit. I have emphasized the need to get the swelling out of the left leg to give this wound the best chance to heal we could not obtain ABIs in this clinic as the patient could not tolerate the compression 12/25/16 on evaluation today patient continues to have good toleration of the compression at this point. She is pleased with how things are progressing. Patient has no nausea, vomiting, or diarrhea and no fevers or chills. Objective Constitutional Obese and well-hydrated in no acute distress. Vitals Time Taken: 9:46 AM, Height: 63 in, Weight: 331 lbs, BMI: 58.6, Temperature: 98.0 F, Pulse: 68 bpm, Respiratory Rate: 20 breaths/min, Blood Pressure: 134/97 mmHg. Respiratory normal breathing without difficulty. Psychiatric this patient is able to make decisions and demonstrates good insight into disease process. Alert and Oriented x 3. pleasant and cooperative. General Notes: This wound shows no surrounding erythema in regard to the wound bed although there is slough and necrotic tissue noted at the 9 o'clock and 3 o'clock location. Debridement was required and [facility] as well as subcutaneous tissue was removed down to a fairly decent wound bed although there is still some work to do. Integumentary (Hair, Skin) Wound #3 status is Open. Original cause of wound was Trauma. The wound is located on the Left,Anterior Lower Leg. The wound measures 1cm length x 3.6cm width x 0.6cm depth; 2.827cm^2 area and 1.696cm^3 volume. There is Fat Layer (Subcutaneous Tissue) Exposed exposed. There is no tunneling or undermining noted. There is a large amount of serous drainage noted. The wound margin is flat and intact. There is no granulation within the  wound bed. There is a large (67-100%) amount of necrotic tissue within the wound bed including Eschar and Adherent Slough. The periwound skin appearance exhibited: Maceration. The periwound skin appearance did not exhibit: Callus, Crepitus, Excoriation, Induration, Rash, Scarring, Dry/Scaly, Atrophie Blanche, Cyanosis, Ecchymosis, Hemosiderin Staining, Mottled, Pallor, Rubor, Erythema. Periwound temperature was noted as No Abnormality. The periwound has tenderness on Hagg, Zyanne M. ([id number]) palpation. Assessment Active Problems ICD-10 S81.812D - Laceration without foreign body, left lower leg, subsequent encounter I89.0 - Lymphedema, not elsewhere classified I87.322 - Chronic venous hypertension (idiopathic) with inflammation of left lower extremity  Procedures Wound #3 Pre-procedure diagnosis of Wound #3 is a Venous Leg Ulcer located on the Left,Anterior Lower Leg .Severity of Tissue Pre Debridement is: Fat layer exposed. There was a Skin/Subcutaneous Tissue Debridement ([id number]) debridement with total area of 3.6 sq cm performed by STONE III, Daryn Pisani E., PA-C. with the following instrument(s): Curette to remove Viable and Non-Viable tissue/material including Fibrin/Slough, Eschar, and Subcutaneous after achieving pain control using Lidocaine 4% Topical Solution. A time out was conducted at 10:00, prior to the start of the procedure. A Moderate amount of bleeding was controlled with Pressure. The procedure was tolerated well with a pain level of 0 throughout and a pain level of 0 following the procedure. Post Debridement Measurements: 1cm length x 3.6cm width x 0.7cm depth; 1.979cm^3 volume. Character of Wound/Ulcer Post Debridement is improved. Severity of Tissue Post Debridement is: Fat layer exposed. Post procedure Diagnosis Wound #3: Same as Pre-Procedure Plan Wound Cleansing: Wound #3 Left,Anterior Lower Leg: Clean wound with Normal Saline. May shower with  protection. Anesthetic: Wound #3 Left,Anterior Lower Leg: Topical Lidocaine 4% cream applied to wound bed prior to debridement [name], Chennel M. ([id number]) Skin Barriers/Peri-Wound Care: Wound #3 Left,Anterior Lower Leg: Triamcinolone Acetonide Ointment Primary Wound Dressing: Wound #3 Left,Anterior Lower Leg: Aquacel Ag Secondary Dressing: Wound #3 Left,Anterior Lower Leg: ABD pad XtraSorb Dressing Change Frequency: Wound #3 Left,Anterior Lower Leg: Other: - twice weekly Follow-up Appointments: Wound #3 Left,Anterior Lower Leg: Return Appointment in 1 week. Nurse Visit as needed Edema Control: Wound #3 Left,Anterior Lower Leg: 3 Layer Compression System - Left Lower Extremity Elevate legs to the level of the heart and pump ankles as often as possible Additional Orders / Instructions: Wound #3 Left,Anterior Lower Leg: Increase protein intake. General Notes: I'm going to switch her dressing at this point to a silver alginate dressing for the next week. We will see her for reevaluation following to see were things stand at that point. If anything worsens she will contact our office and we will see her for a re-wrap of her lower extremity once between now and her next visit next week. If anything worsens she will contact our office in the meantime. Please see above for specific wound care orders. Electronic Signature(s) Signed: 12/25/2016 6:07:57 PM By: Lenda KelpStone III, Karyna Bessler PA-C Entered By: Lenda KelpStone III, Mariachristina Holle on 12/25/2016 10:15:37 Mesta, Barbara SearingBETTY M. ([id number]) -------------------------------------------------------------------------------- SuperBill Details Patient Name: Barbara ApplebaumAY, Marci M. Date of Service: 12/25/2016 Medical Record Number: [id number] Patient Account Number: [id number] Date of Birth/Sex: 03/12/1941 (76 y.o. Female) Treating RN: Curtis Sitesorthy, Joanna Primary Care Provider: Gabriel CirriWICKER, [name] Other Clinician: Referring Provider: Gabriel CirriWICKER, [name] Treating Provider/Extender: Linwood DibblesSTONE III, Evora Schechter Weeks  in Treatment: 1 Diagnosis Coding ICD-10 Codes Code Description S81.812D Laceration without foreign body, left lower leg, subsequent encounter I89.0 Lymphedema, not elsewhere classified I87.322 Chronic venous hypertension (idiopathic) with inflammation of left lower extremity Facility Procedures CPT4: Description Modifier Quantity Code [id number] 11042 - DEB SUBQ TISSUE 20 SQ CM/< 1 ICD-10 Description Diagnosis I87.322 Chronic venous hypertension (idiopathic) with inflammation of left lower extremity Physician Procedures CPT4: Description Modifier Quantity Code [id number] 11042 - WC PHYS SUBQ TISS 20 SQ CM 1 ICD-10 Description Diagnosis I87.322 Chronic venous hypertension (idiopathic) with inflammation of left lower extremity Electronic Signature(s) Signed: 12/25/2016 6:07:57 PM By: Lenda KelpStone III, Janny Crute PA-C Entered By: Lenda KelpStone III, Monicia Tse on 12/25/2016 10:15:50

## 2016-12-30 DIAGNOSIS — J45909 Unspecified asthma, uncomplicated: Secondary | ICD-10-CM | POA: Diagnosis not present

## 2016-12-30 DIAGNOSIS — I10 Essential (primary) hypertension: Secondary | ICD-10-CM | POA: Diagnosis not present

## 2016-12-30 DIAGNOSIS — M109 Gout, unspecified: Secondary | ICD-10-CM | POA: Diagnosis not present

## 2016-12-30 DIAGNOSIS — I890 Lymphedema, not elsewhere classified: Secondary | ICD-10-CM | POA: Diagnosis not present

## 2016-12-30 DIAGNOSIS — S81812D Laceration without foreign body, left lower leg, subsequent encounter: Secondary | ICD-10-CM | POA: Diagnosis not present

## 2016-12-30 DIAGNOSIS — I87322 Chronic venous hypertension (idiopathic) with inflammation of left lower extremity: Secondary | ICD-10-CM | POA: Diagnosis not present

## 2016-12-31 NOTE — Progress Notes (Signed)
[name], [name] ([id number]) Visit Report for 12/30/2016 Arrival Information Details Patient Name: [name] Date of Service: 12/30/2016 8:45 AM Medical Record Number: [id number] Patient Account Number: [id number] Date of Birth/Sex: 1941/05/07 (76 y.o. Female) Treating RN: Huel Coventry Primary Care Reinhardt Licausi: Gabriel Cirri Other Clinician: Referring Teria Khachatryan: Gabriel Cirri Treating Miranda Frese/Extender: Kathreen Cosier in Treatment: 1 Visit Information History Since Last Visit Added or deleted any medications: No Patient Arrived: Walker Any new allergies or adverse reactions: No Arrival Time: 08:50 Had a fall or experienced change in No Accompanied By: self activities of daily living that may affect Transfer Assistance: None risk of falls: Patient Identification Verified: Yes Signs or symptoms of abuse/neglect since last No Secondary Verification Process Yes visito Completed: Hospitalized since last visit: No Patient Requires Transmission-Based No Has Dressing in Place as Prescribed: Yes Precautions: Has Compression in Place as Prescribed: Yes Patient Has Alerts: No Pain Present Now: No Electronic Signature(s) Signed: 12/31/2016 7:32:57 AM By: Elliot Gurney, BSN, RN, CWS, Kim RN, BSN Entered By: Elliot Gurney, BSN, RN, CWS, Kim on 12/30/2016 09:14:18 [name] ([id number]) -------------------------------------------------------------------------------- Encounter Discharge Information Details Patient Name: [name] Date of Service: 12/30/2016 8:45 AM Medical Record Number: [id number] Patient Account Number: [id number] Date of Birth/Sex: 1941-02-07 (76 y.o. Female) Treating RN: Huel Coventry Primary Care Juwan Vences: Gabriel Cirri Other Clinician: Referring Trula Frede: Gabriel Cirri Treating Meryle Pugmire/Extender: Kathreen Cosier in Treatment: 1 Encounter Discharge Information Items Discharge Pain Level: 0 Discharge Condition: Stable Ambulatory Status: Ambulatory Discharge Destination:  Home Private Transportation: Auto Accompanied By: self Schedule Follow-up Appointment: Yes Medication Reconciliation completed and Yes provided to Patient/Care Michaela Broski: Clinical Summary of Care: Electronic Signature(s) Signed: 12/31/2016 7:32:57 AM By: Elliot Gurney, BSN, RN, CWS, Kim RN, BSN Entered By: Elliot Gurney, BSN, RN, CWS, Kim on 12/30/2016 09:15:25 [name] ([id number]) -------------------------------------------------------------------------------- Patient/Caregiver Education Details Patient Name: [name] Date of Service: 12/30/2016 8:45 AM Medical Record Number: [id number] Patient Account Number: [id number] Date of Birth/Gender: 1941/05/08 (76 y.o. Female) Treating RN: Huel Coventry Primary Care Physician: Gabriel Cirri Other Clinician: Referring Physician: Gabriel Cirri Treating Physician/Extender: Kathreen Cosier in Treatment: 1 Education Assessment Education Provided To: Patient Education Topics Provided Wound/Skin Impairment: Handouts: Caring for Your Ulcer, Other: wound care as prescribed Methods: Demonstration Responses: State content correctly Electronic Signature(s) Signed: 12/31/2016 7:32:57 AM By: Elliot Gurney, BSN, RN, CWS, Kim RN, BSN Entered By: Elliot Gurney, BSN, RN, CWS, Kim on 12/30/2016 09:15:13 [name] ([id number]) -------------------------------------------------------------------------------- Wound Assessment Details Patient Name: [name] Date of Service: 12/30/2016 8:45 AM Medical Record Number: [id number] Patient Account Number: [id number] Date of Birth/Sex: 1941/05/02 (76 y.o. Female) Treating RN: Huel Coventry Primary Care Kiyonna Tortorelli: Gabriel Cirri Other Clinician: Referring Greysyn Vanderberg: Gabriel Cirri Treating Adreana Coull/Extender: Kathreen Cosier in Treatment: 1 Wound Status Wound Number: 3 Primary Etiology: Venous Leg Ulcer Wound Location: Left, Anterior Lower Leg Wound Status: Open Wounding Event: Trauma Date Acquired: 12/04/2016 Weeks Of  Treatment: 1 Clustered Wound: No Photos Photo Uploaded By: Elliot Gurney, BSN, RN, CWS, Kim on 12/30/2016 18:05:37 Wound Measurements Length: (cm) 1 Width: (cm) 3.6 Depth: (cm) 0.6 Area: (cm) 2.827 Volume: (cm) 1.696 % Reduction in Area: 5.3% % Reduction in Volume: 5.3% Wound Description Full Thickness With Exposed Support Classification: Structures Periwound Skin Texture Texture Color No Abnormalities Noted: No No Abnormalities Noted: No Moisture No Abnormalities Noted: No Treatment Notes Wound #3 (Left, Anterior Lower Leg) Thorner, Brindle M. ([id number]) 1. Cleansed with: Clean wound with Normal Saline Cleanse wound with antibacterial soap and water 2. Anesthetic Topical  Lidocaine 4% cream to wound bed prior to debridement 4. Dressing Applied: Aquacel Ag 5. Secondary Dressing Applied ABD Pad 7. Secured with Tape 3 Layer Compression System - Left Lower Extremity Notes TCA to periwound, used kerlix instead of cotton on inside of 3-layer Electronic Signature(s) Signed: 12/31/2016 7:32:57 AM By: Elliot GurneyWoody, BSN, RN, CWS, Kim RN, BSN Entered By: Elliot GurneyWoody, BSN, RN, CWS, Kim on 12/30/2016 16:10:9609:14:28

## 2017-01-01 ENCOUNTER — Encounter: Payer: BC Managed Care – PPO | Admitting: Physician Assistant

## 2017-01-01 DIAGNOSIS — M79661 Pain in right lower leg: Secondary | ICD-10-CM | POA: Diagnosis not present

## 2017-01-01 DIAGNOSIS — J45909 Unspecified asthma, uncomplicated: Secondary | ICD-10-CM | POA: Diagnosis not present

## 2017-01-01 DIAGNOSIS — I890 Lymphedema, not elsewhere classified: Secondary | ICD-10-CM | POA: Diagnosis not present

## 2017-01-01 DIAGNOSIS — S81812D Laceration without foreign body, left lower leg, subsequent encounter: Secondary | ICD-10-CM | POA: Diagnosis not present

## 2017-01-01 DIAGNOSIS — S81812A Laceration without foreign body, left lower leg, initial encounter: Secondary | ICD-10-CM | POA: Diagnosis not present

## 2017-01-01 DIAGNOSIS — I87322 Chronic venous hypertension (idiopathic) with inflammation of left lower extremity: Secondary | ICD-10-CM | POA: Diagnosis not present

## 2017-01-01 DIAGNOSIS — L03115 Cellulitis of right lower limb: Secondary | ICD-10-CM | POA: Diagnosis not present

## 2017-01-01 DIAGNOSIS — I10 Essential (primary) hypertension: Secondary | ICD-10-CM | POA: Diagnosis not present

## 2017-01-01 DIAGNOSIS — M109 Gout, unspecified: Secondary | ICD-10-CM | POA: Diagnosis not present

## 2017-01-02 ENCOUNTER — Other Ambulatory Visit
Admission: RE | Admit: 2017-01-02 | Discharge: 2017-01-02 | Disposition: A | Discharge status: Home / Self Care | Payer: BC Managed Care – PPO | Source: Ambulatory Visit | Attending: Physician Assistant | Admitting: Physician Assistant

## 2017-01-02 DIAGNOSIS — L089 Local infection of the skin and subcutaneous tissue, unspecified: Secondary | ICD-10-CM | POA: Diagnosis not present

## 2017-01-03 NOTE — Progress Notes (Signed)
JAMERIAH, TROTTI ([id number]) Visit Report for 01/01/2017 Arrival Information Details Patient Name: [name], [name] Date of Service: 01/01/2017 8:45 AM Medical Record Number: [id number] Patient Account Number: [id number] Date of Birth/Sex: [birth year] (76 y.o. Female) Treating RN: Curtis Sites Primary Care Anaily Ashbaugh: Gabriel Cirri Other Clinician: Referring Tabby Beaston: Gabriel Cirri Treating Gonsalo Cuthbertson/Extender: Linwood Dibbles, HOYT Weeks in Treatment: 2 Visit Information History Since Last Visit Added or deleted any medications: No Patient Arrived: Walker Any new allergies or adverse reactions: No Arrival Time: 08:53 Had a fall or experienced change in No Accompanied By: self activities of daily living that may affect Transfer Assistance: None risk of falls: Patient Identification Verified: Yes Signs or symptoms of abuse/neglect since last No Secondary Verification Process Completed: Yes visito Patient Requires Transmission-Based No Hospitalized since last visit: No Precautions: Has Dressing in Place as Prescribed: Yes Patient Has Alerts: No Has Compression in Place as Prescribed: No Pain Present Now: No Electronic Signature(s) Signed: 01/01/2017 4:53:16 PM By: Curtis Sites Entered By: Curtis Sites on 01/01/2017 08:54:06 Lazo, [name] ([id number]) -------------------------------------------------------------------------------- Encounter Discharge Information Details Patient Name: [name] Date of Service: 01/01/2017 8:45 AM Medical Record Number: [id number] Patient Account Number: [id number] Date of Birth/Sex: Dec 14, 1940 (76 y.o. Female) Treating RN: Curtis Sites Primary Care Adeleigh Barletta: Gabriel Cirri Other Clinician: Referring Jamilla Galli: Gabriel Cirri Treating Marcha Licklider/Extender: Linwood Dibbles, HOYT Weeks in Treatment: 2 Encounter Discharge Information Items Discharge Pain Level: 0 Discharge Condition: Stable Ambulatory Status: Walker Discharge Destination: Home Transportation:  Private Auto Accompanied By: self Schedule Follow-up Appointment: Yes Medication Reconciliation completed No and provided to Patient/Care Marshell Rieger: Provided on Clinical Summary of Care: 01/01/2017 Form Type Recipient Paper Patient BR Electronic Signature(s) Signed: 01/02/2017 10:14:57 AM By: Gwenlyn Perking Entered By: Gwenlyn Perking on 01/01/2017 09:38:04 Umbaugh, [name] ([id number]) -------------------------------------------------------------------------------- Lower Extremity Assessment Details Patient Name: [name] Date of Service: 01/01/2017 8:45 AM Medical Record Number: [id number] Patient Account Number: [id number] Date of Birth/Sex: 08-03-1940 (76 y.o. Female) Treating RN: Curtis Sites Primary Care Audie Wieser: Gabriel Cirri Other Clinician: Referring Jeramyah Goodpasture: Gabriel Cirri Treating Cobi Delph/Extender: Linwood Dibbles, HOYT Weeks in Treatment: 2 Edema Assessment Assessed: [Left: No] [Right: No] Edema: [Left: Ye] [Right: s] Calf Left: Right: Point of Measurement: 30 cm From Medial Instep 55.8 cm cm Ankle Left: Right: Point of Measurement: 7 cm From Medial Instep 32.7 cm cm Vascular Assessment Pulses: Dorsalis Pedis Palpable: [Left:Yes] Posterior Tibial Extremity colors, hair growth, and conditions: Extremity Color: [Left:Hyperpigmented] Hair Growth on Extremity: [Left:No] Temperature of Extremity: [Left:Warm] Capillary Refill: [Left:< 3 seconds] Electronic Signature(s) Signed: 01/01/2017 4:53:16 PM By: Curtis Sites Entered By: Curtis Sites on 01/01/2017 08:56:11 Luberto, [name] ([id number]) -------------------------------------------------------------------------------- Multi Wound Chart Details Patient Name: [name] Date of Service: 01/01/2017 8:45 AM Medical Record Number: [id number] Patient Account Number: [id number] Date of Birth/Sex: 11/02/1940 (76 y.o. Female) Treating RN: Curtis Sites Primary Care Shakinah Navis: Gabriel Cirri Other Clinician: Referring  Shawna Kiener: Gabriel Cirri Treating Angalena Cousineau/Extender: STONE III, HOYT Weeks in Treatment: 2 Vital Signs Height(in): 63 Pulse(bpm): 58 Weight(lbs): 331 Blood Pressure 95/78 (mmHg): Body Mass Index(BMI): 59 Temperature(F): 98.2 Respiratory Rate 20 (breaths/min): Photos: [3:No Photos] [N/A:N/A] Wound Location: [3:Left Lower Leg - Anterior N/A] Wounding Event: [3:Trauma] [N/A:N/A] Primary Etiology: [3:Venous Leg Ulcer] [N/A:N/A] Comorbid History: [3:Cataracts, Asthma, Arrhythmia, Hypertension, Gout] [N/A:N/A] Date Acquired: [3:12/04/2016] [N/A:N/A] Weeks of Treatment: [3:2] [N/A:N/A] Wound Status: [3:Open] [N/A:N/A] Measurements L x W x D 0.9x3.3x0.5 [N/A:N/A] (cm) Area (cm) : [3:2.333] [N/A:N/A] Volume (cm) : [3:1.166] [N/A:N/A] % Reduction in Area: [3:21.80%] [N/A:N/A] % Reduction  in Volume: 34.90% [N/A:N/A] Classification: [3:Full Thickness With Exposed Support Structures] [N/A:N/A] Exudate Amount: [3:Large] [N/A:N/A] Exudate Type: [3:Serous] [N/A:N/A] Exudate Color: [3:amber] [N/A:N/A] Wound Margin: [3:Flat and Intact] [N/A:N/A] Granulation Amount: [3:Small (1-33%)] [N/A:N/A] Granulation Quality: [3:Red] [N/A:N/A] Necrotic Amount: [3:Large (67-100%)] [N/A:N/A] Necrotic Tissue: [3:Eschar, Adherent Slough N/A] Exposed Structures: [3:Fascia: No Fat Layer (Subcutaneous Tissue) Exposed: No] [N/A:N/A] Tendon: No Muscle: No Joint: No Bone: No Epithelialization: None N/A N/A Periwound Skin Texture: Excoriation: No N/A N/A Induration: No Callus: No Crepitus: No Rash: No Scarring: No Periwound Skin Maceration: No N/A N/A Moisture: Dry/Scaly: No Periwound Skin Color: Erythema: Yes N/A N/A Atrophie Blanche: No Cyanosis: No Ecchymosis: No Hemosiderin Staining: No Mottled: No Pallor: No Rubor: No Erythema Location: Circumferential N/A N/A Temperature: No Abnormality N/A N/A Tenderness on Yes N/A N/A Palpation: Wound Preparation: Ulcer Cleansing: N/A  N/A Rinsed/Irrigated with Saline, Other: soap and water Topical Anesthetic Applied: Other: lidocaine 4% Treatment Notes Electronic Signature(s) Signed: 01/01/2017 4:53:16 PM By: Curtis Sites Entered By: Curtis Sites on 01/01/2017 09:02:46 Strehlow, [name] ([id number]) -------------------------------------------------------------------------------- Multi-Disciplinary Care Plan Details Patient Name: [name] Date of Service: 01/01/2017 8:45 AM Medical Record Number: [id number] Patient Account Number: [id number] Date of Birth/Sex: March 07, 1941 (76 y.o. Female) Treating RN: Curtis Sites Primary Care Angeliah Wisdom: Gabriel Cirri Other Clinician: Referring Mada Sadik: Gabriel Cirri Treating Ulyssa Walthour/Extender: Linwood Dibbles, HOYT Weeks in Treatment: 2 Active Inactive ` Abuse / Safety / Falls / Self Care Management Nursing Diagnoses: Potential for falls Goals: Patient will not experience any injury related to falls Date Initiated: 12/18/2016 Target Resolution Date: 01/24/2017 Goal Status: Active Interventions: Assess fall risk on admission and as needed Notes: ` Orientation to the Wound Care Program Nursing Diagnoses: Knowledge deficit related to the wound healing center program Goals: Patient/caregiver will verbalize understanding of the Wound Healing Center Program Date Initiated: 12/18/2016 Target Resolution Date: 01/24/2017 Goal Status: Active Interventions: Provide education on orientation to the wound center Notes: ` Venous Leg Ulcer Nursing Diagnoses: Actual venous Insuffiency (use after diagnosis is confirmed) LANNIE, AASE ([id number]) Goals: Patient will maintain optimal edema control Date Initiated: 12/18/2016 Target Resolution Date: 01/24/2017 Goal Status: Active Interventions: Assess peripheral edema status every visit. Notes: ` Wound/Skin Impairment Nursing Diagnoses: Impaired tissue integrity Goals: Ulcer/skin breakdown will have a volume reduction of 30% by week  4 Date Initiated: 12/18/2016 Target Resolution Date: 01/24/2017 Goal Status: Active Ulcer/skin breakdown will have a volume reduction of 50% by week 8 Date Initiated: 12/18/2016 Target Resolution Date: 01/24/2017 Goal Status: Active Ulcer/skin breakdown will have a volume reduction of 80% by week 12 Date Initiated: 12/18/2016 Target Resolution Date: 01/24/2017 Goal Status: Active Ulcer/skin breakdown will heal within 14 weeks Date Initiated: 12/18/2016 Target Resolution Date: 01/24/2017 Goal Status: Active Interventions: Assess patient/caregiver ability to obtain necessary supplies Assess patient/caregiver ability to perform ulcer/skin care regimen upon admission and as needed Assess ulceration(s) every visit Notes: Electronic Signature(s) Signed: 01/01/2017 4:53:16 PM By: Curtis Sites Entered By: Curtis Sites on 01/01/2017 09:02:37 Showman, [name] ([id number]) -------------------------------------------------------------------------------- Pain Assessment Details Patient Name: [name] Date of Service: 01/01/2017 8:45 AM Medical Record Number: [id number] Patient Account Number: [id number] Date of Birth/Sex: 12-Nov-1940 (76 y.o. Female) Treating RN: Curtis Sites Primary Care Larin Depaoli: Gabriel Cirri Other Clinician: Referring Asia Favata: Gabriel Cirri Treating Melvern Ramone/Extender: Linwood Dibbles, HOYT Weeks in Treatment: 2 Active Problems Location of Pain Severity and Description of Pain Patient Has Paino No Site Locations Pain Management and Medication Current Pain Management: Notes Topical or injectable lidocaine is offered to patient for  acute pain when surgical debridement is performed. If needed, Patient is instructed to use over the counter pain medication for the following 24-48 hours after debridement. Wound care MDs do not prescribed pain medications. Patient has chronic pain or uncontrolled pain. Patient has been instructed to make an appointment with their Primary Care Physician  for pain management. Electronic Signature(s) Signed: 01/01/2017 4:53:16 PM By: Curtis Sites Entered By: Curtis Sites on 01/01/2017 08:54:13 Isley, [name] ([id number]) -------------------------------------------------------------------------------- Patient/Caregiver Education Details Patient Name: [name] Date of Service: 01/01/2017 8:45 AM Medical Record Number: [id number] Patient Account Number: [id number] Date of Birth/Gender: 05/09/1941 (76 y.o. Female) Treating RN: Curtis Sites Primary Care Physician: Gabriel Cirri Other Clinician: Referring Physician: Gabriel Cirri Treating Physician/Extender: Skeet Simmer in Treatment: 2 Education Assessment Education Provided To: Patient Education Topics Provided Venous: Handouts: Other: leg elevation Methods: Explain/Verbal Responses: State content correctly Electronic Signature(s) Signed: 01/01/2017 4:53:16 PM By: Curtis Sites Entered By: Curtis Sites on 01/01/2017 09:16:45 Stefanelli, [name] ([id number]) -------------------------------------------------------------------------------- Wound Assessment Details Patient Name: [name] Date of Service: 01/01/2017 8:45 AM Medical Record Number: [id number] Patient Account Number: [id number] Date of Birth/Sex: Apr 21, 1941 (76 y.o. Female) Treating RN: Curtis Sites Primary Care Traye Bates: Gabriel Cirri Other Clinician: Referring Alilah Mcmeans: Gabriel Cirri Treating Zhanna Melin/Extender: Linwood Dibbles, HOYT Weeks in Treatment: 2 Wound Status Wound Number: 3 Primary Venous Leg Ulcer Etiology: Wound Location: Left Lower Leg - Anterior Wound Status: Open Wounding Event: Trauma Comorbid Cataracts, Asthma, Arrhythmia, Date Acquired: 12/04/2016 History: Hypertension, Gout Weeks Of Treatment: 2 Clustered Wound: No Photos Photo Uploaded By: Curtis Sites on 01/01/2017 13:40:31 Wound Measurements Length: (cm) 0.9 Width: (cm) 3.3 Depth: (cm) 0.5 Area: (cm) 2.333 Volume:  (cm) 1.166 % Reduction in Area: 21.8% % Reduction in Volume: 34.9% Epithelialization: None Tunneling: No Undermining: No Wound Description Full Thickness With Exposed Classification: Support Structures Wound Margin: Flat and Intact Exudate Large Amount: Exudate Type: Serous Exudate Color: amber Foul Odor After Cleansing: No Slough/Fibrino Yes Wound Bed Granulation Amount: Small (1-33%) Exposed Structure Granulation Quality: Red Fascia Exposed: No Necrotic Amount: Large (67-100%) Fat Layer (Subcutaneous Tissue) Exposed: No Schonberg, Anusha M. ([id number]) Necrotic Quality: Eschar, Adherent Slough Tendon Exposed: No Muscle Exposed: No Joint Exposed: No Bone Exposed: No Periwound Skin Texture Texture Color No Abnormalities Noted: No No Abnormalities Noted: No Callus: No Atrophie Blanche: No Crepitus: No Cyanosis: No Excoriation: No Ecchymosis: No Induration: No Erythema: Yes Rash: No Erythema Location: Circumferential Scarring: No Hemosiderin Staining: No Mottled: No Moisture Pallor: No No Abnormalities Noted: No Rubor: No Dry / Scaly: No Maceration: No Temperature / Pain Temperature: No Abnormality Tenderness on Palpation: Yes Wound Preparation Ulcer Cleansing: Rinsed/Irrigated with Saline, Other: soap and water, Topical Anesthetic Applied: Other: lidocaine 4%, Treatment Notes Wound #3 (Left, Anterior Lower Leg) 1. Cleansed with: Cleanse wound with antibacterial soap and water 2. Anesthetic Topical Lidocaine 4% cream to wound bed prior to debridement 4. Dressing Applied: Aquacel Ag Other dressing (specify in notes) 5. Secondary Dressing Applied ABD Pad 7. Secured with Other (specify in notes) Notes netting Electronic Signature(s) Signed: 01/01/2017 4:53:16 PM By: Curtis Sites Entered By: Curtis Sites on 01/01/2017 09:02:30 Lando, [name] ([id number]) -------------------------------------------------------------------------------- Vitals  Details Patient Name: [name] Date of Service: 01/01/2017 8:45 AM Medical Record Number: [id number] Patient Account Number: [id number] Date of Birth/Sex: July 05, 1940 (76 y.o. Female) Treating RN: Curtis Sites Primary Care Martrice Apt: Gabriel Cirri Other Clinician: Referring Alyxis Grippi: Gabriel Cirri Treating Rolonda Pontarelli/Extender: STONE III, HOYT Weeks in Treatment: 2 Vital Signs Time  Taken: 08:54 Temperature (F): 98.2 Height (in): 63 Pulse (bpm): 58 Weight (lbs): 331 Respiratory Rate (breaths/min): 20 Body Mass Index (BMI): 58.6 Blood Pressure (mmHg): 95/78 Reference Range: 80 - 120 mg / dl Electronic Signature(s) Signed: 01/01/2017 4:53:16 PM By: Curtis Sites Entered By: Curtis Sites on 01/01/2017 08:54:30

## 2017-01-03 NOTE — Progress Notes (Signed)
LOURDES, [name] ([id number]) Visit Report for 01/01/2017 Chief Complaint Document Details Patient Name: [name], [name] Date of Service: 01/01/2017 8:45 AM Medical Record Number: [id number] Patient Account Number: [id number] Date of Birth/Sex: [birth year] (76 y.o. Female) Treating RN: Curtis Sites Primary Care Provider: Gabriel Cirri Other Clinician: Referring Provider: Gabriel Cirri Treating Provider/Extender: Linwood Dibbles, Caniyah Murley Weeks in Treatment: 2 Information Obtained from: Patient Chief Complaint bilateral lower extremity edema. right post calf ulcer. 12/18/16; patient is here for a laceration injury on the left anterior lower leg Electronic Signature(s) Signed: 01/02/2017 12:37:32 AM By: Lenda Kelp PA-C Entered By: Lenda Kelp on 01/01/2017 23:05:44 [name], [name] ([id number]) -------------------------------------------------------------------------------- HPI Details Patient Name: [name] Date of Service: 01/01/2017 8:45 AM Medical Record Number: [id number] Patient Account Number: [id number] Date of Birth/Sex: 1941-03-03 (76 y.o. Female) Treating RN: Curtis Sites Primary Care Provider: Gabriel Cirri Other Clinician: Referring Provider: Gabriel Cirri Treating Provider/Extender: Linwood Dibbles, Nivea Wojdyla Weeks in Treatment: 2 History of Present Illness Location: The patient has bilateral lower extremities swelling. she does have a small wound on the lower posterior aspect of her right lower extremity. Severity: the patient swelling and erythema has been moderate. Duration: she has had the lower extremity swelling for about 14 years now. She's noticed erythema in the last week or so. Context: marked lymphedema bilateral Modifying Factors: she has a medical grade compression stockings at home but she has never worn them because that she feels they're too tight. Associated Signs and Symptoms: she denies any fevers. HPI Description: The patient is a 76 year old female who presents  with bilateral lower extremity swelling with right post calf ulcer and erythema. She has a long history of lower extremity swelling. She first noticed it in 2001. The patient returns for follow-up today. Her RLE erythema has subsided. 12/18/16; READMISSION this is a patient who is been in this clinic previously I think for a wound on the right lower extremity. She has a history of severe bilateral lower stamina and the lymphedema and also chronic venous insufficiency with inflammation [stasis dermatitis]. She wears 20-30 mm below-knee compression stockings and she is compliant with these. She also has external compression pumps at home but does not use these in fact she states that she doesn't think they actually work because of problems with the Velcro. 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 This immediately opened up and started bleeding. She was seen in the emergency room on 12/04/16 and she had 14 sutures placed. This was followed by primary care felt to have coexistent cellulitis. The sutures were removed on 11/21/16 and the wound was Steri-Stripped although the tissue has not adhered and she has an open wound currently. Lab work on 12/13/16 showed a white count of 5.7. At some point she was felt to have cellulitis and started on Biaxin she is allergic to penicillin, quinolones and sulfonamides. She has not been systemically unwell. She did not tolerate compression last time and tells me she had to come in every 3 days to have a rewrapped done. I've talked to her about this and said the best thing we can do is see her back once for a nurse visit. I have emphasized the need to get the swelling out of the left leg to give this wound the best chance to heal we could not obtain ABIs in this clinic as the patient could not tolerate  the compression 12/25/16 on evaluation today patient continues to  have good toleration of the compression at this point. She is pleased with how things are progressing. Patient has no nausea, vomiting, or diarrhea and no fevers or [name], [name] ([id number]) chills. 01/01/17 on evaluation today patient appears to be doing fairly well in regard to her right lower extremity. At this time she seems to be having some issues with potential cellulitis of this right lower extremity unfortunately. She also has increased pain compared to the previous evaluation. That was one week ago. She wake her discomfort to be a 4-5 out of 10 that is definitely worse with cleansing of the wound in particular. She has been tolerating the compression okay although at this point I'm not sure that we are gonna continue that due to the potential infection. We have been using a silver alginate dressing up to this point. Patient wonders if there's anything else we should do going forward. No fevers, chills, nausea, or vomiting noted at this time. Electronic Signature(s) Signed: 01/02/2017 12:37:32 AM By: Lenda Kelp PA-C Entered By: Lenda Kelp on 01/01/2017 23:09:34 [name], [name] ([id number]) -------------------------------------------------------------------------------- Physical Exam Details Patient Name: [name] Date of Service: 01/01/2017 8:45 AM Medical Record Number: [id number] Patient Account Number: [id number] Date of Birth/Sex: Dec 10, 1940 (76 y.o. Female) Treating RN: Curtis Sites Primary Care Provider: Gabriel Cirri Other Clinician: Referring Provider: Gabriel Cirri Treating Provider/Extender: STONE III, Alivia Cimino Weeks in Treatment: 2 Constitutional Well-nourished and well-hydrated in no acute distress. Respiratory normal breathing without difficulty. clear to auscultation bilaterally. Cardiovascular regular rate and rhythm with normal S1, S2. Psychiatric this patient is able to make decisions and demonstrates good insight into disease process. Alert  and Oriented x 3. pleasant and cooperative. Notes Patient has slough covering the wound although this is not completely covered and debridement was not performed due to the potential for infection at this time. Electronic Signature(s) Signed: 01/02/2017 12:37:32 AM By: Lenda Kelp PA-C Entered By: Lenda Kelp on 01/01/2017 23:19:13 Weedman, [name] ([id number]) -------------------------------------------------------------------------------- Physician Orders Details Patient Name: [name] Date of Service: 01/01/2017 8:45 AM Medical Record Number: [id number] Patient Account Number: [id number] Date of Birth/Sex: May 03, 1941 (76 y.o. Female) Treating RN: Curtis Sites Primary Care Provider: Gabriel Cirri Other Clinician: Referring Provider: Gabriel Cirri Treating Provider/Extender: Linwood Dibbles, Montee Tallman Weeks in Treatment: 2 Verbal / Phone Orders: No Diagnosis Coding ICD-10 Coding Code Description S81.812D Laceration without foreign body, left lower leg, subsequent encounter I89.0 Lymphedema, not elsewhere classified I87.322 Chronic venous hypertension (idiopathic) with inflammation of left lower extremity Wound Cleansing Wound #3 Left,Anterior Lower Leg o Clean wound with Normal Saline. o May shower with protection. Anesthetic Wound #3 Left,Anterior Lower Leg o Topical Lidocaine 4% cream applied to wound bed prior to debridement Primary Wound Dressing Wound #3 Left,Anterior Lower Leg o Mupirocin Ointment o Aquacel Ag Secondary Dressing Wound #3 Left,Anterior Lower Leg o ABD and Kerlix/Conform Dressing Change Frequency Wound #3 Left,Anterior Lower Leg o Change dressing every other day. Follow-up Appointments Wound #3 Left,Anterior Lower Leg o Return Appointment in 1 week. Edema Control Wound #3 Left,Anterior Lower Leg [name], [name] ([id number]) o Patient to wear own compression stockings o Elevate legs to the level of the heart and pump ankles as  often as possible Additional Orders / Instructions Wound #3 Left,Anterior Lower Leg o Increase protein intake. Laboratory o Bacteria identified in Wound by Culture (MICRO) - (ICD10 S81.812D - Laceration without foreign body, left lower leg, subsequent  encounter) oooo LOINC Code: [id number] oooo Convenience Name: Wound culture routine Patient Medications Allergies: Flagyl, Diovan, Penicillins, Lotensin, Sulfa (Sulfonamide Antibiotics), Benicar, diltiazem HCl, Cipro, Coreg Notifications Medication Indication Start End mupirocin DOSE topical 2 % ointment - ointment topical to be applied every 2 day under the dressing x 2 weeks Notes Im going To recommend that we continue with the current wound care measures per above although I am gonna add mupirocin ointment to be applied topically to the wound bed. We will then apply the silver alginate dressing as previous. No debridement was performed today as I did not want to worsen anything in regard to potential infection. We will see were things stand in one week. If anything worsens in the interim patient will contact our office for additional recommendations. Please see above for specific wounds care orders. Electronic Signature(s) Signed: 01/02/2017 12:37:32 AM By: Lenda Kelp PA-C Previous Signature: 01/01/2017 4:53:16 PM Version By: Curtis Sites Previous Signature: 01/01/2017 9:28:41 AM Version By: Lenda Kelp PA-C Entered By: Lenda Kelp on 01/01/2017 23:19:45 [name], [name] ([id number]) -------------------------------------------------------------------------------- Problem List Details Patient Name: [name] Date of Service: 01/01/2017 8:45 AM Medical Record Number: [id number] Patient Account Number: [id number] Date of Birth/Sex: 05/07/1941 (76 y.o. Female) Treating RN: Curtis Sites Primary Care Provider: Gabriel Cirri Other Clinician: Referring Provider: Gabriel Cirri Treating Provider/Extender: Linwood Dibbles,  Pearline Yerby Weeks in Treatment: 2 Active Problems ICD-10 Encounter Code Description Active Date Diagnosis S81.812D Laceration without foreign body, left lower leg, subsequent 12/18/2016 Yes encounter I89.0 Lymphedema, not elsewhere classified 12/18/2016 Yes I87.322 Chronic venous hypertension (idiopathic) with 12/18/2016 Yes inflammation of left lower extremity Inactive Problems Resolved Problems Electronic Signature(s) Signed: 01/02/2017 12:37:32 AM By: Lenda Kelp PA-C Entered By: Lenda Kelp on 01/01/2017 09:10:15 [name], [name] ([id number]) -------------------------------------------------------------------------------- Progress Note Details Patient Name: [name] Date of Service: 01/01/2017 8:45 AM Medical Record Number: [id number] Patient Account Number: [id number] Date of Birth/Sex: 27-May-1940 (76 y.o. Female) Treating RN: Curtis Sites Primary Care Provider: Gabriel Cirri Other Clinician: Referring Provider: Gabriel Cirri Treating Provider/Extender: Linwood Dibbles, Shawnika Pepin Weeks in Treatment: 2 Subjective Chief Complaint Information obtained from Patient bilateral lower extremity edema. right post calf ulcer. 12/18/16; patient is here for a laceration injury on the left anterior lower leg History of Present Illness (HPI) The following HPI elements were documented for the patient's wound: Location: The patient has bilateral lower extremities swelling. she does have a small wound on the lower posterior aspect of her right lower extremity. Severity: the patient swelling and erythema has been moderate. Duration: she has had the lower extremity swelling for about 14 years now. She's noticed erythema in the last week or so. Context: marked lymphedema bilateral Modifying Factors: she has a medical grade compression stockings at home but she has never worn them because that she feels they're too tight. Associated Signs and Symptoms: she denies any fevers. The patient is a 76 year old  female who presents with bilateral lower extremity swelling with right post calf ulcer and erythema. She has a long history of lower extremity swelling. She first noticed it in 2001. The patient returns for follow-up today. Her RLE erythema has subsided. 12/18/16; READMISSION this is a patient who is been in this clinic previously I think for a wound on the right lower extremity. She has a history of severe bilateral lower stamina and the lymphedema and also chronic venous insufficiency with inflammation [stasis dermatitis]. She wears 20-30 mm below-knee compression stockings and she is compliant with  these. She also has external compression pumps at home but does not use these in fact she states that she doesn't think they actually work because of problems with the Velcro. 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 This immediately opened up and started bleeding. She was seen in the emergency room on 12/04/16 and she had 14 sutures placed. This was followed by primary care felt to have coexistent cellulitis. The sutures were removed on 11/21/16 and the wound was Steri-Stripped although the tissue has not adhered and she has an open wound currently. Lab work on 12/13/16 showed a white count of 5.7. At some point she was felt to have cellulitis and started on Biaxin she is allergic to penicillin, quinolones and sulfonamides. She has not been systemically unwell. She did not tolerate compression last time and tells me she had to come in every 3 days to have a [name], [name] ([id number]) rewrapped done. I've talked to her about this and said the best thing we can do is see her back once for a nurse visit. I have emphasized the need to get the swelling out of the left leg to give this wound the best chance to heal we could not obtain ABIs in this clinic as the patient could not tolerate the  compression 12/25/16 on evaluation today patient continues to have good toleration of the compression at this point. She is pleased with how things are progressing. Patient has no nausea, vomiting, or diarrhea and no fevers or chills. 01/01/17 on evaluation today patient appears to be doing fairly well in regard to her right lower extremity. At this time she seems to be having some issues with potential cellulitis of this right lower extremity unfortunately. She also has increased pain compared to the previous evaluation. That was one week ago. She wake her discomfort to be a 4-5 out of 10 that is definitely worse with cleansing of the wound in particular. She has been tolerating the compression okay although at this point I'm not sure that we are gonna continue that due to the potential infection. We have been using a silver alginate dressing up to this point. Patient wonders if there's anything else we should do going forward. No fevers, chills, nausea, or vomiting noted at this time. Objective Constitutional Well-nourished and well-hydrated in no acute distress. Vitals Time Taken: 8:54 AM, Height: 63 in, Weight: 331 lbs, BMI: 58.6, Temperature: 98.2 F, Pulse: 58 bpm, Respiratory Rate: 20 breaths/min, Blood Pressure: 95/78 mmHg. Respiratory normal breathing without difficulty. clear to auscultation bilaterally. Cardiovascular regular rate and rhythm with normal S1, S2. Psychiatric this patient is able to make decisions and demonstrates good insight into disease process. Alert and Oriented x 3. pleasant and cooperative. General Notes: Patient has slough covering the wound although this is not completely covered and debridement was not performed due to the potential for infection at this time. [name], [name] ([id number]) Integumentary (Hair, Skin) Wound #3 status is Open. Original cause of wound was Trauma. The wound is located on the Left,Anterior Lower Leg. The wound measures 0.9cm length  x 3.3cm width x 0.5cm depth; 2.333cm^2 area and 1.166cm^3 volume. There is no tunneling or undermining noted. There is a large amount of serous drainage noted. The wound margin is flat and intact. There is small (1-33%) red granulation within the wound bed. There is a large (67-100%) amount of necrotic tissue  within the wound bed including Eschar and Adherent Slough. The periwound skin appearance exhibited: Erythema. The periwound skin appearance did not exhibit: Callus, Crepitus, Excoriation, Induration, Rash, Scarring, Dry/Scaly, Maceration, Atrophie Blanche, Cyanosis, Ecchymosis, Hemosiderin Staining, Mottled, Pallor, Rubor. The surrounding wound skin color is noted with erythema which is circumferential. Periwound temperature was noted as No Abnormality. The periwound has tenderness on palpation. Assessment Active Problems ICD-10 S81.812D - Laceration without foreign body, left lower leg, subsequent encounter I89.0 - Lymphedema, not elsewhere classified I87.322 - Chronic venous hypertension (idiopathic) with inflammation of left lower extremity Plan Wound Cleansing: Wound #3 Left,Anterior Lower Leg: Clean wound with Normal Saline. May shower with protection. Anesthetic: Wound #3 Left,Anterior Lower Leg: Topical Lidocaine 4% cream applied to wound bed prior to debridement Primary Wound Dressing: Wound #3 Left,Anterior Lower Leg: Mupirocin Ointment Aquacel Ag Secondary Dressing: Wound #3 Left,Anterior Lower Leg: ABD and Kerlix/Conform Dressing Change Frequency: Wound #3 Left,Anterior Lower Leg: Change dressing every other day. Follow-up Appointments: Wound #3 Left,Anterior Lower Leg: [name], [name]. ([id number]) Return Appointment in 1 week. Edema Control: Wound #3 Left,Anterior Lower Leg: Patient to wear own compression stockings Elevate legs to the level of the heart and pump ankles as often as possible Additional Orders / Instructions: Wound #3 Left,Anterior Lower  Leg: Increase protein intake. Laboratory ordered were: Wound culture routine The following medication(s) was prescribed: mupirocin topical 2 % ointment ointment topical to be applied every 2 day under the dressing x 2 weeks General Notes: Im going To recommend that we continue with the current wound care measures per above although I am gonna add mupirocin ointment to be applied topically to the wound bed. We will then apply the silver alginate dressing as previous. No debridement was performed today as I did not want to worsen anything in regard to potential infection. We will see were things stand in one week. If anything worsens in the interim patient will contact our office for additional recommendations. Please see above for specific wounds care orders. Electronic Signature(s) Signed: 01/02/2017 12:37:32 AM By: Lenda Kelp PA-C Entered By: Lenda Kelp on 01/01/2017 23:21:36 Schweiss, [name] ([id number]) -------------------------------------------------------------------------------- SuperBill Details Patient Name: [name] Date of Service: 01/01/2017 Medical Record Number: [id number] Patient Account Number: [id number] Date of Birth/Sex: 1940-07-27 (76 y.o. Female) Treating RN: Curtis Sites Primary Care Provider: Gabriel Cirri Other Clinician: Referring Provider: Gabriel Cirri Treating Provider/Extender: Linwood Dibbles, Alisandra Son Weeks in Treatment: 2 Diagnosis Coding ICD-10 Codes Code Description S81.812D Laceration without foreign body, left lower leg, subsequent encounter I89.0 Lymphedema, not elsewhere classified I87.322 Chronic venous hypertension (idiopathic) with inflammation of left lower extremity Facility Procedures CPT4: Description Modifier Quantity Code [id number] (Facility Use Only) [phone] - APPLY MULTLAY COMPRS LWR LT 1 LEG Physician Procedures CPT4: Description Modifier Quantity Code [id number] - WC PHYS LEVEL 4 - EST PT 1 ICD-10 Description Diagnosis S81.812D  Laceration without foreign body, left lower leg, subsequent encounter I89.0 Lymphedema, not elsewhere classified I87.322 Chronic  venous hypertension (idiopathic) with inflammation of left lower extremity Electronic Signature(s) Signed: 01/02/2017 1:18:38 PM By: Curtis Sites Previous Signature: 01/02/2017 12:37:32 AM Version By: Lenda Kelp PA-C Entered By: Curtis Sites on 01/02/2017 13:18:38

## 2017-01-07 LAB — AEROBIC CULTURE W GRAM STAIN (SUPERFICIAL SPECIMEN)

## 2017-01-07 LAB — AEROBIC CULTURE  (SUPERFICIAL SPECIMEN)

## 2017-01-08 ENCOUNTER — Ambulatory Visit: Payer: BC Managed Care – PPO | Admitting: Unknown Physician Specialty

## 2017-01-08 ENCOUNTER — Encounter: Payer: BC Managed Care – PPO | Admitting: Internal Medicine

## 2017-01-08 DIAGNOSIS — I87322 Chronic venous hypertension (idiopathic) with inflammation of left lower extremity: Secondary | ICD-10-CM | POA: Diagnosis not present

## 2017-01-08 DIAGNOSIS — S81812D Laceration without foreign body, left lower leg, subsequent encounter: Secondary | ICD-10-CM | POA: Diagnosis not present

## 2017-01-08 DIAGNOSIS — I10 Essential (primary) hypertension: Secondary | ICD-10-CM | POA: Diagnosis not present

## 2017-01-08 DIAGNOSIS — I87312 Chronic venous hypertension (idiopathic) with ulcer of left lower extremity: Secondary | ICD-10-CM | POA: Diagnosis not present

## 2017-01-08 DIAGNOSIS — J45909 Unspecified asthma, uncomplicated: Secondary | ICD-10-CM | POA: Diagnosis not present

## 2017-01-08 DIAGNOSIS — L97822 Non-pressure chronic ulcer of other part of left lower leg with fat layer exposed: Secondary | ICD-10-CM | POA: Diagnosis not present

## 2017-01-08 DIAGNOSIS — I890 Lymphedema, not elsewhere classified: Secondary | ICD-10-CM | POA: Diagnosis not present

## 2017-01-08 DIAGNOSIS — M109 Gout, unspecified: Secondary | ICD-10-CM | POA: Diagnosis not present

## 2017-01-09 NOTE — Progress Notes (Signed)
ASJA, FROMMER ([id number]) Visit Report for 01/08/2017 Arrival Information Details Patient Name: [name], [name] Date of Service: 01/08/2017 3:45 PM Medical Record Number: [id number] Patient Account Number: [id number] Date of Birth/Sex: [birth year] (76 y.o. Female) Treating RN: Huel Coventry Primary Care Nnaemeka Samson: Gabriel Cirri Other Clinician: Referring Shaneya Taketa: Gabriel Cirri Treating Alie Hardgrove/Extender: Altamese [place] in Treatment: 3 Visit Information History Since Last Visit Added or deleted any medications: No Patient Arrived: Walker Any new allergies or adverse reactions: No Arrival Time: 15:53 Had a fall or experienced change in No Accompanied By: self activities of daily living that may affect Transfer Assistance: None risk of falls: Patient Identification Verified: Yes Signs or symptoms of abuse/neglect since last No Secondary Verification Process Completed: Yes visito Patient Requires Transmission-Based No Hospitalized since last visit: No Precautions: Has Compression in Place as Prescribed: Yes Patient Has Alerts: No Pain Present Now: No Electronic Signature(s) Signed: 01/08/2017 4:57:33 PM By: Elliot Gurney, BSN, RN, CWS, Kim RN, BSN Entered By: Elliot Gurney, BSN, RN, CWS, Kim on 01/08/2017 15:54:14 Cipollone, [name] ([id number]) -------------------------------------------------------------------------------- Encounter Discharge Information Details Patient Name: [name] Date of Service: 01/08/2017 3:45 PM Medical Record Number: [id number] Patient Account Number: [id number] Date of Birth/Sex: 13-Apr-1941 (76 y.o. Female) Treating RN: Huel Coventry Primary Care Amelya Mabry: Gabriel Cirri Other Clinician: Referring Sharai Overbay: Gabriel Cirri Treating Satvik Parco/Extender: Altamese [place] in Treatment: 3 Encounter Discharge Information Items Discharge Pain Level: 0 Discharge Condition: Stable Ambulatory Status: Walker Discharge Destination:  Home Private Transportation: Auto Accompanied By: self Schedule Follow-up Appointment: Yes Medication Reconciliation completed and Yes provided to Patient/Care Lynette Noah: Patient Clinical Summary of Care: Declined Electronic Signature(s) Signed: 01/08/2017 4:57:33 PM By: Elliot Gurney, BSN, RN, CWS, Kim RN, BSN Previous Signature: 01/08/2017 4:21:28 PM Version By: Gwenlyn Perking Entered By: Elliot Gurney BSN, RN, CWS, Kim on 01/08/2017 16:44:15 Wessell, [name] ([id number]) -------------------------------------------------------------------------------- Lower Extremity Assessment Details Patient Name: [name] Date of Service: 01/08/2017 3:45 PM Medical Record Number: [id number] Patient Account Number: [id number] Date of Birth/Sex: 1941-05-17 (76 y.o. Female) Treating RN: Huel Coventry Primary Care Laban Orourke: Gabriel Cirri Other Clinician: Referring Aleiyah Halpin: Gabriel Cirri Treating Alyra Patty/Extender: Altamese [place] in Treatment: 3 Edema Assessment Assessed: [Left: No] [Right: No] E[Left: dema] [Right: :] Calf Left: Right: Point of Measurement: 30 cm From Medial Instep 55.6 cm cm Ankle Left: Right: Point of Measurement: 7 cm From Medial Instep 32.5 cm cm Vascular Assessment Pulses: Dorsalis Pedis Palpable: [Left:Yes] Posterior Tibial Extremity colors, hair growth, and conditions: Extremity Color: [Left:Red] Hair Growth on Extremity: [Left:No] Temperature of Extremity: [Left:Warm] Capillary Refill: [Left:< 3 seconds] Dependent Rubor: [Left:No] Blanched when Elevated: [Left:No] Lipodermatosclerosis: [Left:No] Toe Nail Assessment Left: Right: Thick: No Discolored: No Deformed: No Improper Length and Hygiene: No Electronic Signature(s) Signed: 01/08/2017 4:57:33 PM By: Elliot Gurney, BSN, RN, CWS, Kim RN, BSN Lundin, Orange ([id number]) Entered By: Elliot Gurney, BSN, RN, CWS, Kim on 01/08/2017 16:01:35 Constantin, [name]  ([id number]) -------------------------------------------------------------------------------- Multi Wound Chart Details Patient Name: [name] Date of Service: 01/08/2017 3:45 PM Medical Record Number: [id number] Patient Account Number: [id number] Date of Birth/Sex: [birth year] (76 y.o. Female) Treating RN: Huel Coventry Primary Care Arwyn Besaw: Gabriel Cirri Other Clinician: Referring Aryelle Figg: Gabriel Cirri Treating Keidrick Murty/Extender: Altamese [place] in Treatment: 3 Vital Signs Height(in): 63 Pulse(bpm): 62 Weight(lbs): 331 Blood Pressure 129/62 (mmHg): Body Mass Index(BMI): 59 Temperature(F): 98.2 Respiratory Rate 18 (breaths/min): Photos: [N/A:N/A] Wound Location: Left Lower Leg - Anterior N/A N/A Wounding Event: Trauma N/A N/A Primary Etiology: Venous Leg Ulcer N/A N/A  Comorbid History: Cataracts, Asthma, N/A N/A Arrhythmia, Hypertension, Gout Date Acquired: 12/04/2016 N/A N/A Weeks of Treatment: 3 N/A N/A Wound Status: Open N/A N/A Measurements L x W x D 0.6x3x0.3 N/A N/A (cm) Area (cm) : 1.414 N/A N/A Volume (cm) : 0.424 N/A N/A % Reduction in Area: 52.60% N/A N/A % Reduction in Volume: 76.30% N/A N/A Classification: Full Thickness With N/A N/A Exposed Support Structures Exudate Amount: Large N/A N/A Exudate Type: Serous N/A N/A Exudate Color: amber N/A N/A Wound Margin: Flat and Intact N/A N/A Granulation Amount: Medium (34-66%) N/A N/A Granulation Quality: Red N/A N/A [name], [name] ([id number]) Necrotic Amount: Medium (34-66%) N/A N/A Exposed Structures: Fat Layer (Subcutaneous N/A N/A Tissue) Exposed: Yes Fascia: No Tendon: No Muscle: No Joint: No Bone: No Epithelialization: Small (1-33%) N/A N/A Debridement: Debridement ([id number]- N/A N/A 11047) Pre-procedure 16:05 N/A N/A Verification/Time Out Taken: Pain Control: Other N/A N/A Tissue Debrided: Fibrin/Slough, N/A N/A Subcutaneous Level: Skin/Subcutaneous N/A N/A Tissue Debridement  Area (sq 1.8 N/A N/A cm): Instrument: Curette N/A N/A Bleeding: Minimum N/A N/A Hemostasis Achieved: Pressure N/A N/A Procedural Pain: 0 N/A N/A Post Procedural Pain: 1 N/A N/A Debridement Treatment Procedure was tolerated N/A N/A Response: well Post Debridement 0.6x3x0.3 N/A N/A Measurements L x W x D (cm) Post Debridement 0.424 N/A N/A Volume: (cm) Periwound Skin Texture: Excoriation: No N/A N/A Induration: No Callus: No Crepitus: No Rash: No Scarring: No Periwound Skin Maceration: No N/A N/A Moisture: Dry/Scaly: No Periwound Skin Color: Ecchymosis: Yes N/A N/A Atrophie Blanche: No Cyanosis: No Erythema: No Hemosiderin Staining: No Mottled: No Pallor: No Rubor: No Temperature: No Abnormality N/A N/A Tall, Bethzy M. ([id number]) Tenderness on Yes N/A N/A Palpation: Wound Preparation: Ulcer Cleansing: N/A N/A Rinsed/Irrigated with Saline Topical Anesthetic Applied: Other: lidocaine 4% Procedures Performed: Debridement N/A N/A Treatment Notes Electronic Signature(s) Signed: 01/08/2017 5:02:24 PM By: Baltazar Najjar MD Entered By: Baltazar Najjar on 01/08/2017 16:32:00 Britz, [name] ([id number]) -------------------------------------------------------------------------------- Multi-Disciplinary Care Plan Details Patient Name: [name] Date of Service: 01/08/2017 3:45 PM Medical Record Number: [id number] Patient Account Number: [id number] Date of Birth/Sex: [birth year] (76 y.o. Female) Treating RN: Huel Coventry Primary Care Yuleni Burich: Gabriel Cirri Other Clinician: Referring Tracker Mance: Gabriel Cirri Treating Emiyah Spraggins/Extender: Altamese [place] in Treatment: 3 Active Inactive ` Abuse / Safety / Falls / Self Care Management Nursing Diagnoses: Potential for falls Goals: Patient will not experience any injury related to falls Date Initiated: 12/18/2016 Target Resolution Date: 01/24/2017 Goal Status: Active Interventions: Assess fall risk on admission and  as needed Notes: ` Orientation to the Wound Care Program Nursing Diagnoses: Knowledge deficit related to the wound healing center program Goals: Patient/caregiver will verbalize understanding of the Wound Healing Center Program Date Initiated: 12/18/2016 Target Resolution Date: 01/24/2017 Goal Status: Active Interventions: Provide education on orientation to the wound center Notes: ` Venous Leg Ulcer Nursing Diagnoses: Actual venous Insuffiency (use after diagnosis is confirmed) ELLI, GROESBECK ([id number]) Goals: Patient will maintain optimal edema control Date Initiated: 12/18/2016 Target Resolution Date: 01/24/2017 Goal Status: Active Interventions: Assess peripheral edema status every visit. Notes: ` Wound/Skin Impairment Nursing Diagnoses: Impaired tissue integrity Goals: Ulcer/skin breakdown will have a volume reduction of 30% by week 4 Date Initiated: 12/18/2016 Target Resolution Date: 01/24/2017 Goal Status: Active Ulcer/skin breakdown will have a volume reduction of 50% by week 8 Date Initiated: 12/18/2016 Target Resolution Date: 01/24/2017 Goal Status: Active Ulcer/skin breakdown will have a volume reduction of 80% by week 12 Date Initiated: 12/18/2016 Target Resolution Date:  01/24/2017 Goal Status: Active Ulcer/skin breakdown will heal within 14 weeks Date Initiated: 12/18/2016 Target Resolution Date: 01/24/2017 Goal Status: Active Interventions: Assess patient/caregiver ability to obtain necessary supplies Assess patient/caregiver ability to perform ulcer/skin care regimen upon admission and as needed Assess ulceration(s) every visit Notes: Electronic Signature(s) Signed: 01/08/2017 4:57:33 PM By: Elliot Gurney, BSN, RN, CWS, Kim RN, BSN Entered By: Elliot Gurney, BSN, RN, CWS, Kim on 01/08/2017 16:08:45 Atkins, [name] ([id number]) -------------------------------------------------------------------------------- Pain Assessment Details Patient Name: [name] Date of Service: 01/08/2017  3:45 PM Medical Record Number: [id number] Patient Account Number: [id number] Date of Birth/Sex: 22-Jan-1941 (76 y.o. Female) Treating RN: Huel Coventry Primary Care Aarthi Uyeno: Gabriel Cirri Other Clinician: Referring Jezebel Pollet: Gabriel Cirri Treating Rosamae Rocque/Extender: Altamese [place] in Treatment: 3 Active Problems Location of Pain Severity and Description of Pain Patient Has Paino No Site Locations With Dressing Change: No Pain Management and Medication Current Pain Management: Goals for Pain Management Topical or injectable lidocaine is offered to patient for acute pain when surgical debridement is performed. If needed, Patient is instructed to use over the counter pain medication for the following 24-48 hours after debridement. Wound care MDs do not prescribed pain medications. Patient has chronic pain or uncontrolled pain. Patient has been instructed to make an appointment with their Primary Care Physician for pain management. Electronic Signature(s) Signed: 01/08/2017 4:57:33 PM By: Elliot Gurney, BSN, RN, CWS, Kim RN, BSN Entered By: Elliot Gurney, BSN, RN, CWS, Kim on 01/08/2017 15:54:25 Que, [name] ([id number]) -------------------------------------------------------------------------------- Patient/Caregiver Education Details Patient Name: [name] Date of Service: 01/08/2017 3:45 PM Medical Record Patient Account Number: [id number] Number: Treating RN: Huel Coventry 1941-04-05 (76 y.o. Other Clinician: Date of Birth/Gender: Female) Treating ROBSON, MICHAEL Primary Care Physician: Gabriel Cirri Physician/Extender: G Referring Physician: Alfredo Martinez in Treatment: 3 Education Assessment Education Provided To: Patient Education Topics Provided Wound/Skin Impairment: Handouts: Caring for Your Ulcer Methods: Demonstration Responses: State content correctly Electronic Signature(s) Signed: 01/08/2017 4:57:33 PM By: Elliot Gurney, BSN, RN, CWS, Kim RN, BSN Entered By:  Elliot Gurney, BSN, RN, CWS, Kim on 01/08/2017 16:44:25 Glotfelty, [name] ([id number]) -------------------------------------------------------------------------------- Wound Assessment Details Patient Name: [name] Date of Service: 01/08/2017 3:45 PM Medical Record Number: [id number] Patient Account Number: [id number] Date of Birth/Sex: Jan 27, 1941 (76 y.o. Female) Treating RN: Huel Coventry Primary Care Myshawn Chir[place]oga: Gabriel Cirri Other Clinician: Referring Markiesha Delia: Gabriel Cirri Treating Vanecia Limpert/Extender: Altamese [place] in Treatment: 3 Wound Status Wound Number: 3 Primary Venous Leg Ulcer Etiology: Wound Location: Left Lower Leg - Anterior Wound Status: Open Wounding Event: Trauma Comorbid Cataracts, Asthma, Arrhythmia, Date Acquired: 12/04/2016 History: Hypertension, Gout Weeks Of Treatment: 3 Clustered Wound: No Photos Wound Measurements Length: (cm) 0.6 Width: (cm) 3 Depth: (cm) 0.3 Area: (cm) 1.414 Volume: (cm) 0.424 % Reduction in Area: 52.6% % Reduction in Volume: 76.3% Epithelialization: Small (1-33%) Tunneling: No Undermining: No Wound Description Full Thickness With Exposed Classification: Support Structures Wound Margin: Flat and Intact Exudate Large Amount: Exudate Type: Serous Exudate Color: amber Foul Odor After Cleansing: No Slough/Fibrino Yes Wound Bed Granulation Amount: Medium (34-66%) Exposed Structure Granulation Quality: Red Fascia Exposed: No Necrotic Amount: Medium (34-66%) Fat Layer (Subcutaneous Tissue) Exposed: Yes Necrotic Quality: Adherent Slough Tendon Exposed: No Muscle Exposed: No Joint Exposed: No Mcclintock, Calli M. ([id number]) Bone Exposed: No Periwound Skin Texture Texture Color No Abnormalities Noted: No No Abnormalities Noted: No Callus: No Atrophie Blanche: No Crepitus: No Cyanosis: No Excoriation: No Ecchymosis: Yes Induration: No Erythema: No Rash: No Hemosiderin Staining: No Scarring: No  Mottled: No Pallor:  No Moisture Rubor: No No Abnormalities Noted: No Dry / Scaly: No Temperature / Pain Maceration: No Temperature: No Abnormality Tenderness on Palpation: Yes Wound Preparation Ulcer Cleansing: Rinsed/Irrigated with Saline Topical Anesthetic Applied: Other: lidocaine 4%, Treatment Notes Wound #3 (Left, Anterior Lower Leg) 1. Cleansed with: Clean wound with Normal Saline 2. Anesthetic Topical Lidocaine 4% cream to wound bed prior to debridement 4. Dressing Applied: Aquacel Ag Other dressing (specify in notes) 5. Secondary Dressing Applied [place] Notes mupiricin on wound, netting to Pharmacist, community) Signed: 01/08/2017 4:57:33 PM By: Elliot Gurney, BSN, RN, CWS, Kim RN, BSN Entered By: Elliot Gurney, BSN, RN, CWS, Kim on 01/08/2017 16:00:10 Miquel, Lamson [name] ([id number]) -------------------------------------------------------------------------------- Vitals Details Patient Name: [name] Date of Service: 01/08/2017 3:45 PM Medical Record Number: [id number] Patient Account Number: [id number] Date of Birth/Sex: 01/01/1941 (76 y.o. Female) Treating RN: Huel Coventry Primary Care Tavien Chestnut: Gabriel Cirri Other Clinician: Referring Journii Nierman: Gabriel Cirri Treating Sandrea Boer/Extender: Altamese [place] in Treatment: 3 Vital Signs Time Taken: 15:54 Temperature (F): 98.2 Height (in): 63 Pulse (bpm): 62 Weight (lbs): 331 Respiratory Rate (breaths/min): 18 Body Mass Index (BMI): 58.6 Blood Pressure (mmHg): 129/62 Reference Range: 80 - 120 mg / dl Electronic Signature(s) Signed: 01/08/2017 4:57:33 PM By: Elliot Gurney, BSN, RN, CWS, Kim RN, BSN Entered By: Elliot Gurney, BSN, RN, CWS, Kim on 01/08/2017 15:55:26

## 2017-01-09 NOTE — Progress Notes (Signed)
CLINTON, WAHLBERG ([id number]) Visit Report for 01/08/2017 Debridement Details Patient Name: [name], [name]. Date of Service: 01/08/2017 3:45 PM Medical Record Patient Account Number: [id number] Number: Treating RN: Huel Coventry 1941/02/01 (76 y.o. Other Clinician: Date of Birth/Sex: Female) Treating Pennelope Basque Primary Care Provider: Gabriel Cirri Provider/Extender: G Referring Provider: Alfredo Martinez in Treatment: 3 Debridement Performed for Wound #3 Left,Anterior Lower Leg Assessment: Performed By: Physician Maxwell Caul, MD Debridement: Debridement Severity of Tissue Pre Fat layer exposed Debridement: Pre-procedure Verification/Time Out Yes - 16:05 Taken: Start Time: 16:06 Pain Control: Other : lidocaine 4% Level: Skin/Subcutaneous Tissue Total Area Debrided (L x 0.6 (cm) x 3 (cm) = 1.8 (cm) W): Tissue and other Viable, Non-Viable, Fibrin/Slough, Subcutaneous material debrided: Instrument: Curette Bleeding: Minimum Hemostasis Achieved: Pressure End Time: 16:10 Procedural Pain: 0 Post Procedural Pain: 1 Response to Treatment: Procedure was tolerated well Post Debridement Measurements of Total Wound Length: (cm) 0.6 Width: (cm) 3 Depth: (cm) 0.3 Volume: (cm) 0.424 Character of Wound/Ulcer Post Improved Debridement: Severity of Tissue Post Debridement: Fat layer exposed Post Procedure Diagnosis Same as Pre-procedure [name], [name] ([id number]) Electronic Signature(s) Signed: 01/08/2017 4:57:33 PM By: Elliot Gurney, BSN, RN, CWS, Kim RN, BSN Signed: 01/08/2017 5:02:24 PM By: Baltazar Najjar MD Entered By: Baltazar Najjar on 01/08/2017 16:32:11 [name], [name] ([id number]) -------------------------------------------------------------------------------- HPI Details Patient Name: [name] Date of Service: 01/08/2017 3:45 PM Medical Record Patient Account Number: [id number] Number: Treating RN: Huel Coventry 11-17-1940 (76 y.o. Other  Clinician: Date of Birth/Sex: Female) Treating Margene Cherian Primary Care Provider: Gabriel Cirri Provider/Extender: G Referring Provider: Alfredo Martinez in Treatment: 3 History of Present Illness Location: The patient has bilateral lower extremities swelling. she does have a small wound on the lower posterior aspect of her right lower extremity. Severity: the patient swelling and erythema has been moderate. Duration: she has had the lower extremity swelling for about 14 years now. She's noticed erythema in the last week or so. Context: marked lymphedema bilateral Modifying Factors: she has a medical grade compression stockings at home but she has never worn them because that she feels they're too tight. Associated Signs and Symptoms: she denies any fevers. HPI Description: The patient is a 76 year old female who presents with bilateral lower extremity swelling with right post calf ulcer and erythema. She has a long history of lower extremity swelling. She first noticed it in 2001. The patient returns for follow-up today. Her RLE erythema has subsided. 12/18/16; READMISSION this is a patient who is been in this clinic previously I think for a wound on the right lower extremity. She has a history of severe bilateral lower stamina and the lymphedema and also chronic venous insufficiency with inflammation [stasis dermatitis]. She wears 20-30 mm below-knee compression stockings and she is compliant with these. She also has external compression pumps at home but does not use these in fact she states that she doesn't think they actually work because of problems with the Velcro. 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 This immediately opened up and started bleeding. She was seen in the emergency room on 12/04/16 and she had 14 sutures placed. This was followed by primary care felt to  have coexistent cellulitis. The sutures were removed on 11/21/16 and the wound was Steri-Stripped although the tissue has not adhered and she has an open wound currently. Lab work on 12/13/16 showed  a white count of 5.7. At some point she was felt to have cellulitis and started on Biaxin she is allergic to penicillin, quinolones and sulfonamides. She has not been systemically unwell. She did not tolerate compression last time and tells me she had to come in every 3 days to have a rewrapped done. I've talked to her about this and said the best thing we can do is see her back once for a nurse visit. I have emphasized the need to get the swelling out of the left leg to give this wound the best chance to heal we could not obtain ABIs in this clinic as the patient could not tolerate the compression Varnum, Sharlon M. ([id number]) 12/25/16 on evaluation today patient continues to have good toleration of the compression at this point. She is pleased with how things are progressing. Patient has no nausea, vomiting, or diarrhea and no fevers or chills. 01/01/17 on evaluation today patient appears to be doing fairly well in regard to her right lower extremity. At this time she seems to be having some issues with potential cellulitis of this right lower extremity unfortunately. She also has increased pain compared to the previous evaluation. That was one week ago. She wake her discomfort to be a 4-5 out of 10 that is definitely worse with cleansing of the wound in particular. She has been tolerating the compression okay although at this point I'm not sure that we are gonna continue that due to the potential infection. We have been using a silver alginate dressing up to this point. Patient wonders if there's anything else we should do going forward. No fevers, chills, nausea, or vomiting noted at this time. 01/08/17; right lower extremity wound in the setting of very significant lymphedema. Culture last week  grew Proteus. She is not on antibiotics. She is using Hydrofera Blue but instead of compression wraps is using her own compression stocking. She states she feels better and indeed the wound has continued to improve Electronic Signature(s) Signed: 01/08/2017 5:02:24 PM By: Baltazar Najjar MD Entered By: Baltazar Najjar on 01/08/2017 16:34:17 [name], [name] ([id number]) -------------------------------------------------------------------------------- Physical Exam Details Patient Name: [name] Date of Service: 01/08/2017 3:45 PM Medical Record Patient Account Number: [id number] Number: Treating RN: Huel Coventry May 16, 1941 (76 y.o. Other Clinician: Date of Birth/Sex: Female) Treating Kadarrius Yanke Primary Care Provider: Gabriel Cirri Provider/Extender: G Referring Provider: Alfredo Martinez in Treatment: 3 Constitutional Sitting or standing Blood Pressure is within target range for patient.. Pulse regular and within target range for patient.Marland Kitchen Respirations regular, non-labored and within target range.. Temperature is normal and within the target range for the patient.Marland Kitchen appears in no distress. Notes Wound exam; patient has slough covering the base of the wound. Using a #3 curet this was removed with some nonviable subcutaneous tissue. The wound cleans up quite nicely. She has no surrounding erythema and no tenderness there is really no evidence of cellulitis. I suspect we cultured an innocent colonizing Therapist, art) Signed: 01/08/2017 5:02:24 PM By: Baltazar Najjar MD Entered By: Baltazar Najjar on 01/08/2017 16:35:29 [name], [name] ([id number]) -------------------------------------------------------------------------------- Physician Orders Details Patient Name: [name] Date of Service: 01/08/2017 3:45 PM Medical Record Patient Account Number: [id number] Number: Treating RN: Huel Coventry 10/06/1940 (76 y.o. Other Clinician: Date of  Birth/Sex: Female) Treating Rebecka Oelkers Primary Care Provider: Gabriel Cirri Provider/Extender: G Referring Provider: Alfredo Martinez in Treatment: 3 Verbal / Phone Orders: No Diagnosis Coding Wound Cleansing Wound #3 Left,Anterior Lower  Leg o Clean wound with Normal Saline. o May shower with protection. Anesthetic Wound #3 Left,Anterior Lower Leg o Topical Lidocaine 4% cream applied to wound bed prior to debridement Primary Wound Dressing Wound #3 Left,Anterior Lower Leg o Mupirocin Ointment o Aquacel Ag Secondary Dressing Wound #3 Left,Anterior Lower Leg o Other - telfa Dressing Change Frequency Wound #3 Left,Anterior Lower Leg o Change dressing every other day. Follow-up Appointments Wound #3 Left,Anterior Lower Leg o Return Appointment in 1 week. Edema Control Wound #3 Left,Anterior Lower Leg o Patient to wear own compression stockings o Elevate legs to the level of the heart and pump ankles as often as possible Additional Orders / Instructions Wound #3 Left,Anterior Lower Leg [name], [name] ([id number]) o Increase protein intake. Electronic Signature(s) Signed: 01/08/2017 4:57:33 PM By: Elliot Gurney, BSN, RN, CWS, Kim RN, BSN Signed: 01/08/2017 5:02:24 PM By: Baltazar Najjar MD Entered By: Elliot Gurney, BSN, RN, CWS, Kim on 01/08/2017 16:19:24 [name], [name] ([id number]) -------------------------------------------------------------------------------- Problem List Details Patient Name: [name] Date of Service: 01/08/2017 3:45 PM Medical Record Patient Account Number: [id number] Number: Treating RN: Huel Coventry 05-21-1940 (76 y.o. Other Clinician: Date of Birth/Sex: Female) Treating Cullen Lahaie Primary Care Provider: Gabriel Cirri Provider/Extender: G Referring Provider: Alfredo Martinez in Treatment: 3 Active Problems ICD-10 Encounter Code Description Active Date Diagnosis S81.812D Laceration without foreign body, left  lower leg, subsequent 12/18/2016 Yes encounter I89.0 Lymphedema, not elsewhere classified 12/18/2016 Yes I87.322 Chronic venous hypertension (idiopathic) with 12/18/2016 Yes inflammation of left lower extremity Inactive Problems Resolved Problems Electronic Signature(s) Signed: 01/08/2017 5:02:24 PM By: Baltazar Najjar MD Entered By: Baltazar Najjar on 01/08/2017 16:31:51 [name], [name] ([id number]) -------------------------------------------------------------------------------- Progress Note Details Patient Name: [name] Date of Service: 01/08/2017 3:45 PM Medical Record Patient Account Number: [id number] Number: Treating RN: Huel Coventry 1940-08-18 (76 y.o. Other Clinician: Date of Birth/Sex: Female) Treating Sabrinia Prien Primary Care Provider: Gabriel Cirri Provider/Extender: G Referring Provider: Alfredo Martinez in Treatment: 3 Subjective History of Present Illness (HPI) The following HPI elements were documented for the patient's wound: Location: The patient has bilateral lower extremities swelling. she does have a small wound on the lower posterior aspect of her right lower extremity. Severity: the patient swelling and erythema has been moderate. Duration: she has had the lower extremity swelling for about 14 years now. She's noticed erythema in the last week or so. Context: marked lymphedema bilateral Modifying Factors: she has a medical grade compression stockings at home but she has never worn them because that she feels they're too tight. Associated Signs and Symptoms: she denies any fevers. The patient is a 76 year old female who presents with bilateral lower extremity swelling with right post calf ulcer and erythema. She has a long history of lower extremity swelling. She first noticed it in 2001. The patient returns for follow-up today. Her RLE erythema has subsided. 12/18/16; READMISSION this is a patient who is been in this clinic previously I think  for a wound on the right lower extremity. She has a history of severe bilateral lower stamina and the lymphedema and also chronic venous insufficiency with inflammation [stasis dermatitis]. She wears 20-30 mm below-knee compression stockings and she is compliant with these. She also has external compression pumps at home but does not use these in fact she states that she doesn't think they actually work because of problems with the Velcro. 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 This immediately opened up and started bleeding. She was seen in the emergency room on 12/04/16 and she had 14 sutures placed. This was followed by primary care felt to have coexistent cellulitis. The sutures were removed on 11/21/16 and the wound was Steri-Stripped although the tissue has not adhered and she has an open wound currently. Lab work on 12/13/16 showed a white count of 5.7. At some point she was felt to have cellulitis and started on Biaxin she is allergic to penicillin, quinolones and sulfonamides. She has not been systemically unwell. She did not tolerate compression last time and tells me she had to come in every 3 days to have a rewrapped done. I've talked to her about this and said the best thing we can do is see her back once for a nurse visit. I have emphasized the need to get the swelling out of the left leg to give this wound the best chance to heal [name], [name]. ([id number]) we could not obtain ABIs in this clinic as the patient could not tolerate the compression 12/25/16 on evaluation today patient continues to have good toleration of the compression at this point. She is pleased with how things are progressing. Patient has no nausea, vomiting, or diarrhea and no fevers or chills. 01/01/17 on evaluation today patient appears to be doing fairly well in regard to her right lower extremity. At this time  she seems to be having some issues with potential cellulitis of this right lower extremity unfortunately. She also has increased pain compared to the previous evaluation. That was one week ago. She wake her discomfort to be a 4-5 out of 10 that is definitely worse with cleansing of the wound in particular. She has been tolerating the compression okay although at this point I'm not sure that we are gonna continue that due to the potential infection. We have been using a silver alginate dressing up to this point. Patient wonders if there's anything else we should do going forward. No fevers, chills, nausea, or vomiting noted at this time. 01/08/17; right lower extremity wound in the setting of very significant lymphedema. Culture last week grew Proteus. She is not on antibiotics. She is using Hydrofera Blue but instead of compression wraps is using her own compression stocking. She states she feels better and indeed the wound has continued to improve Objective Constitutional Sitting or standing Blood Pressure is within target range for patient.. Pulse regular and within target range for patient.Marland Kitchen Respirations regular, non-labored and within target range.. Temperature is normal and within the target range for the patient.Marland Kitchen appears in no distress. Vitals Time Taken: 3:54 PM, Height: 63 in, Weight: 331 lbs, BMI: 58.6, Temperature: 98.2 F, Pulse: 62 bpm, Respiratory Rate: 18 breaths/min, Blood Pressure: 129/62 mmHg. General Notes: Wound exam; patient has slough covering the base of the wound. Using a #3 curet this was removed with some nonviable subcutaneous tissue. The wound cleans up quite nicely. She has no surrounding erythema and no tenderness there is really no evidence of cellulitis. I suspect we cultured an innocent colonizing bystander Integumentary (Hair, Skin) Wound #3 status is Open. Original cause of wound was Trauma. The wound is located on the Left,Anterior Lower Leg. The wound  measures 0.6cm length x 3cm width x 0.3cm depth; 1.414cm^2 area and 0.424cm^3 volume. There is Fat Layer (Subcutaneous Tissue) Exposed exposed. There is no tunneling or undermining noted. There is a large amount of serous drainage noted. The wound  margin is flat and intact. There is medium (34-66%) red granulation within the wound bed. There is a medium (34-66%) amount of necrotic tissue within the wound bed including Adherent Slough. The periwound skin appearance exhibited: [name], [name]. ([id number]) Ecchymosis. The periwound skin appearance did not exhibit: Callus, Crepitus, Excoriation, Induration, Rash, Scarring, Dry/Scaly, Maceration, Atrophie Blanche, Cyanosis, Hemosiderin Staining, Mottled, Pallor, Rubor, Erythema. Periwound temperature was noted as No Abnormality. The periwound has tenderness on palpation. Assessment Active Problems ICD-10 S81.812D - Laceration without foreign body, left lower leg, subsequent encounter I89.0 - Lymphedema, not elsewhere classified I87.322 - Chronic venous hypertension (idiopathic) with inflammation of left lower extremity Procedures Wound #3 Pre-procedure diagnosis of Wound #3 is a Venous Leg Ulcer located on the Left,Anterior Lower Leg .Severity of Tissue Pre Debridement is: Fat layer exposed. There was a Skin/Subcutaneous Tissue Debridement ([id number]) debridement with total area of 1.8 sq cm performed by Maxwell Caul, MD. with the following instrument(s): Curette to remove Viable and Non-Viable tissue/material including Fibrin/Slough and Subcutaneous after achieving pain control using Other (lidocaine 4%). A time out was conducted at 16:05, prior to the start of the procedure. A Minimum amount of bleeding was controlled with Pressure. The procedure was tolerated well with a pain level of 0 throughout and a pain level of 1 following the procedure. Post Debridement Measurements: 0.6cm length x 3cm width x 0.3cm depth;  0.424cm^3 volume. Character of Wound/Ulcer Post Debridement is improved. Severity of Tissue Post Debridement is: Fat layer exposed. Post procedure Diagnosis Wound #3: Same as Pre-Procedure Plan Wound Cleansing: Wound #3 Left,Anterior Lower Leg: Clean wound with Normal Saline. May shower with protection. [name], KLEINSCHMIDT ([id number]) Anesthetic: Wound #3 Left,Anterior Lower Leg: Topical Lidocaine 4% cream applied to wound bed prior to debridement Primary Wound Dressing: Wound #3 Left,Anterior Lower Leg: Mupirocin Ointment Aquacel Ag Secondary Dressing: Wound #3 Left,Anterior Lower Leg: Other - telfa Dressing Change Frequency: Wound #3 Left,Anterior Lower Leg: Change dressing every other day. Follow-up Appointments: Wound #3 Left,Anterior Lower Leg: Return Appointment in 1 week. Edema Control: Wound #3 Left,Anterior Lower Leg: Patient to wear own compression stockings Elevate legs to the level of the heart and pump ankles as often as possible Additional Orders / Instructions: Wound #3 Left,Anterior Lower Leg: Increase protein intake. continue Aquacel AG/mupirocin/telfa/own stocking Electronic Signature(s) Signed: 01/08/2017 5:02:24 PM By: Baltazar Najjar MD Entered By: Baltazar Najjar on 01/08/2017 16:38:23 Coello, [name] ([id number]) -------------------------------------------------------------------------------- SuperBill Details Patient Name: [name] Date of Service: 01/08/2017 Medical Record Patient Account Number: [id number] Number: Treating RN: Huel Coventry 08-Jul-1940 (76 y.o. Other Clinician: Date of Birth/Sex: Female) Treating Vaani Morren Primary Care Provider: Gabriel Cirri Provider/Extender: G Referring Provider: Alfredo Martinez in Treatment: 3 Diagnosis Coding ICD-10 Codes Code Description S81.812D Laceration without foreign body, left lower leg, subsequent encounter I89.0 Lymphedema, not elsewhere classified I87.322 Chronic venous  hypertension (idiopathic) with inflammation of left lower extremity Facility Procedures CPT4 Code Description: [id number] 11042 - DEB SUBQ TISSUE 20 SQ CM/< ICD-10 Description Diagnosis S81.812D Laceration without foreign body, left lower leg, su I89.0 Lymphedema, not elsewhere classified Modifier: bsequent encou Quantity: 1 nter Physician Procedures CPT4 Code Description: [id number] 11042 - WC PHYS SUBQ TISS 20 SQ CM ICD-10 Description Diagnosis S81.812D Laceration without foreign body, left lower leg, su I89.0 Lymphedema, not elsewhere classified Modifier: bsequent encou Quantity: 1 nter Electronic Signature(s) Signed: 01/08/2017 5:02:24 PM By: Baltazar Najjar MD Entered By: Baltazar Najjar on 01/08/2017 16:38:48

## 2017-01-13 ENCOUNTER — Ambulatory Visit: Payer: BC Managed Care – PPO | Admitting: Unknown Physician Specialty

## 2017-01-15 ENCOUNTER — Encounter: Payer: BC Managed Care – PPO | Admitting: Internal Medicine

## 2017-01-15 DIAGNOSIS — S81802A Unspecified open wound, left lower leg, initial encounter: Secondary | ICD-10-CM | POA: Diagnosis not present

## 2017-01-15 DIAGNOSIS — I10 Essential (primary) hypertension: Secondary | ICD-10-CM | POA: Diagnosis not present

## 2017-01-15 DIAGNOSIS — J45909 Unspecified asthma, uncomplicated: Secondary | ICD-10-CM | POA: Diagnosis not present

## 2017-01-15 DIAGNOSIS — M109 Gout, unspecified: Secondary | ICD-10-CM | POA: Diagnosis not present

## 2017-01-15 DIAGNOSIS — I890 Lymphedema, not elsewhere classified: Secondary | ICD-10-CM | POA: Diagnosis not present

## 2017-01-15 DIAGNOSIS — S81812D Laceration without foreign body, left lower leg, subsequent encounter: Secondary | ICD-10-CM | POA: Diagnosis not present

## 2017-01-15 DIAGNOSIS — I87322 Chronic venous hypertension (idiopathic) with inflammation of left lower extremity: Secondary | ICD-10-CM | POA: Diagnosis not present

## 2017-01-17 NOTE — Progress Notes (Signed)
Barbara, [name] ([id number]) Visit Report for 01/15/2017 Arrival Information Details Patient Name: [name], [facility] Date of Service: 01/15/2017 2:30 PM Medical Record Number: [id number] Patient Account Number: [id number] Date of Birth/Sex: 07-Apr-1941 (76 y.o. Female) Treating RN: Curtis Sites Primary Care Johnattan Strassman: Gabriel Cirri Other Clinician: Referring Brooksie Ellwanger: Gabriel Cirri Treating Heyli Min/Extender: Altamese [place] in Treatment: 4 Visit Information History Since Last Visit Added or deleted any medications: No Patient Arrived: Walker Any new allergies or adverse reactions: No Arrival Time: 14:34 Had a fall or experienced change in No Accompanied By: self activities of daily living that may affect Transfer Assistance: None risk of falls: Patient Identification Verified: Yes Signs or symptoms of abuse/neglect since last No Secondary Verification Process Completed: Yes visito Patient Requires Transmission-Based No Hospitalized since last visit: No Precautions: Has Dressing in Place as Prescribed: Yes Patient Has Alerts: No Has Compression in Place as Prescribed: Yes Pain Present Now: No Electronic Signature(s) Signed: 01/15/2017 5:39:07 PM By: Curtis Sites Entered By: Curtis Sites on 01/15/2017 14:35:02 Chimento, [name] -------------------------------------------------------------------------------- Clinic Level of Care Assessment Details Patient Name: [name] Date of Service: 01/15/2017 2:30 PM Medical Record Number: [id number] Patient Account Number: [id number] Date of Birth/Sex: 1941-02-27 (76 y.o. Female) Treating RN: Curtis Sites Primary Care Kham Zuckerman: Gabriel Cirri Other Clinician: Referring Jashira Cotugno: Gabriel Cirri Treating Detric Scalisi/Extender: Altamese [place] in Treatment: 4 Clinic Level of Care Assessment Items TOOL 4 Quantity Score []  - Use when only an EandM is performed on FOLLOW-UP visit 0 ASSESSMENTS - Nursing Assessment /  Reassessment X - Reassessment of Co-morbidities (includes updates in patient status) 1 10 X - Reassessment of Adherence to Treatment Plan 1 5 ASSESSMENTS - Wound and Skin Assessment / Reassessment X - Simple Wound Assessment / Reassessment - one wound 1 5 []  - Complex Wound Assessment / Reassessment - multiple wounds 0 []  - Dermatologic / Skin Assessment (not related to wound area) 0 ASSESSMENTS - Focused Assessment X - Circumferential Edema Measurements - multi extremities 1 5 []  - Nutritional Assessment / Counseling / Intervention 0 X - Lower Extremity Assessment (monofilament, tuning fork, pulses) 1 5 []  - Peripheral Arterial Disease Assessment (using hand held doppler) 0 ASSESSMENTS - Ostomy and/or Continence Assessment and Care []  - Incontinence Assessment and Management 0 []  - Ostomy Care Assessment and Management (repouching, etc.) 0 PROCESS - Coordination of Care X - Simple Patient / Family Education for ongoing care 1 15 []  - Complex (extensive) Patient / Family Education for ongoing care 0 []  - Staff obtains Chiropractor, Records, Test Results / Process Orders 0 []  - Staff telephones HHA, Nursing Homes / Clarify orders / etc 0 []  - Routine Transfer to another Facility (non-emergent condition) 0 Claiborne, Taina M. ([id number]) []  - Routine Hospital Admission (non-emergent condition) 0 []  - New Admissions / Manufacturing engineer / Ordering NPWT, Apligraf, etc. 0 []  - Emergency Hospital Admission (emergent condition) 0 X - Simple Discharge Coordination 1 10 []  - Complex (extensive) Discharge Coordination 0 PROCESS - Special Needs []  - Pediatric / Minor Patient Management 0 []  - Isolation Patient Management 0 []  - Hearing / Language / Visual special needs 0 []  - Assessment of Community assistance (transportation, D/C planning, etc.) 0 []  - Additional assistance / Altered mentation 0 []  - Support Surface(s) Assessment (bed, cushion, seat, etc.) 0 INTERVENTIONS - Wound Cleansing /  Measurement X - Simple Wound Cleansing - one wound 1 5 []  - Complex Wound Cleansing - multiple wounds 0 X - Wound Imaging (  photographs - any number of wounds) 1 5 []  - Wound Tracing (instead of photographs) 0 X - Simple Wound Measurement - one wound 1 5 []  - Complex Wound Measurement - multiple wounds 0 INTERVENTIONS - Wound Dressings X - Small Wound Dressing one or multiple wounds 1 10 []  - Medium Wound Dressing one or multiple wounds 0 []  - Large Wound Dressing one or multiple wounds 0 []  - Application of Medications - topical 0 []  - Application of Medications - injection 0 INTERVENTIONS - Miscellaneous []  - External ear exam 0 Fedrick, Cashmere M. ([id number]) []  - Specimen Collection (cultures, biopsies, blood, body fluids, etc.) 0 []  - Specimen(s) / Culture(s) sent or taken to Lab for analysis 0 []  - Patient Transfer (multiple staff / Michiel Sites Lift / Similar devices) 0 []  - Simple Staple / Suture removal (25 or less) 0 []  - Complex Staple / Suture removal (26 or more) 0 []  - Hypo / Hyperglycemic Management (close monitor of Blood Glucose) 0 []  - Ankle / Brachial Index (ABI) - do not check if billed separately 0 X - Vital Signs 1 5 Has the patient been seen at the hospital within the last three years: Yes Total Score: 85 Level Of Care: New/Established - Level 3 Electronic Signature(s) Signed: 01/15/2017 5:39:07 PM By: Curtis Sites Entered By: Curtis Sites on 01/15/2017 17:04:02 Pettigrew, [name] ([id number]) -------------------------------------------------------------------------------- Encounter Discharge Information Details Patient Name: [name] Date of Service: 01/15/2017 2:30 PM Medical Record Number: [id number] Patient Account Number: [id number] Date of Birth/Sex: 1941-01-12 (76 y.o. Female) Treating RN: Curtis Sites Primary Care Lakira Ogando: Gabriel Cirri Other Clinician: Referring Winthrop Shannahan: Gabriel Cirri Treating Sherly Brodbeck/Extender: Altamese [place] in Treatment:  4 Encounter Discharge Information Items Discharge Pain Level: 0 Discharge Condition: Stable Ambulatory Status: Walker Discharge Destination: Home Transportation: Private Auto Accompanied By: self Schedule Follow-up Appointment: Yes Medication Reconciliation completed No and provided to Patient/Care Jarom Govan: Provided on Clinical Summary of Care: 01/15/2017 Form Type Recipient Paper Patient BR Electronic Signature(s) Signed: 01/15/2017 5:06:32 PM By: Curtis Sites Entered By: Curtis Sites on 01/15/2017 17:06:31 Shelvin, [name] ([id number]) -------------------------------------------------------------------------------- Lower Extremity Assessment Details Patient Name: [name] Date of Service: 01/15/2017 2:30 PM Medical Record Number: [id number] Patient Account Number: [id number] Date of Birth/Sex: [birth year] (76 y.o. Female) Treating RN: Curtis Sites Primary Care Renee Erb: Gabriel Cirri Other Clinician: Referring Benna Arno: Gabriel Cirri Treating Gilmore List/Extender: Altamese [place] in Treatment: 4 Edema Assessment Assessed: [Left: No] [Right: No] E[Left: dema] [Right: :] Calf Left: Right: Point of Measurement: 30 cm From Medial Instep 60.3 cm cm Ankle Left: Right: Point of Measurement: 7 cm From Medial Instep 32.7 cm cm Vascular Assessment Pulses: Dorsalis Pedis Palpable: [Left:Yes] Posterior Tibial Extremity colors, hair growth, and conditions: Extremity Color: [Left:Red] Hair Growth on Extremity: [Left:Yes] Temperature of Extremity: [Left:Warm] Capillary Refill: [Left:< 3 seconds] Electronic Signature(s) Signed: 01/15/2017 5:39:07 PM By: Curtis Sites Entered By: Curtis Sites on 01/15/2017 14:53:00 Elkhatib, [name] ([id number]) -------------------------------------------------------------------------------- Multi Wound Chart Details Patient Name: [name] Date of Service: 01/15/2017 2:30 PM Medical Record Number: [id number] Patient Account  Number: [id number] Date of Birth/Sex: 12/05/1940 (76 y.o. Female) Treating RN: Curtis Sites Primary Care Sirus Labrie: Gabriel Cirri Other Clinician: Referring Jayni Prescher: Gabriel Cirri Treating Aseneth Hack/Extender: Altamese [place] in Treatment: 4 Vital Signs Height(in): 63 Pulse(bpm): 65 Weight(lbs): 331 Blood Pressure 141/60 (mmHg): Body Mass Index(BMI): 59 Temperature(F): 97.9 Respiratory Rate 20 (breaths/min): Photos: [3:No Photos] [N/A:N/A] Wound Location: [3:Left Lower Leg - Anterior N/A] Wounding Event: [3:Trauma] [N/A:N/A]  Primary Etiology: [3:Venous Leg Ulcer] [N/A:N/A] Comorbid History: [3:Cataracts, Asthma, Arrhythmia, Hypertension, Gout] [N/A:N/A] Date Acquired: [3:12/04/2016] [N/A:N/A] Weeks of Treatment: [3:4] [N/A:N/A] Wound Status: [3:Open] [N/A:N/A] Measurements L x W x D 0.5x2.7x0.2 [N/A:N/A] (cm) Area (cm) : [3:1.06] [N/A:N/A] Volume (cm) : [3:0.212] [N/A:N/A] % Reduction in Area: [3:64.50%] [N/A:N/A] % Reduction in Volume: 88.20% [N/A:N/A] Classification: [3:Full Thickness With Exposed Support Structures] [N/A:N/A] Exudate Amount: [3:Large] [N/A:N/A] Exudate Type: [3:Serous] [N/A:N/A] Exudate Color: [3:amber] [N/A:N/A] Wound Margin: [3:Flat and Intact] [N/A:N/A] Granulation Amount: [3:Medium (34-66%)] [N/A:N/A] Granulation Quality: [3:Red] [N/A:N/A] Necrotic Amount: [3:Medium (34-66%)] [N/A:N/A] Exposed Structures: [3:Fat Layer (Subcutaneous N/A Tissue) Exposed: Yes Fascia: No Tendon: No] Muscle: No Joint: No Bone: No Epithelialization: Small (1-33%) N/A N/A Periwound Skin Texture: Excoriation: No N/A N/A Induration: No Callus: No Crepitus: No Rash: No Scarring: No Periwound Skin Maceration: No N/A N/A Moisture: Dry/Scaly: No Periwound Skin Color: Ecchymosis: Yes N/A N/A Atrophie Blanche: No Cyanosis: No Erythema: No Hemosiderin Staining: No Mottled: No Pallor: No Rubor: No Temperature: No Abnormality N/A N/A Tenderness on Yes  N/A N/A Palpation: Wound Preparation: Ulcer Cleansing: N/A N/A Rinsed/Irrigated with Saline Topical Anesthetic Applied: Other: lidocaine 4% Treatment Notes Electronic Signature(s) Signed: 01/15/2017 5:01:41 PM By: Baltazar Najjar MD Entered By: Baltazar Najjar on 01/15/2017 15:54:58 Lori, [name] ([id number]) -------------------------------------------------------------------------------- Multi-Disciplinary Care Plan Details Patient Name: [name] Date of Service: 01/15/2017 2:30 PM Medical Record Number: [id number] Patient Account Number: [id number] Date of Birth/Sex: 12-Oct-1940 (76 y.o. Female) Treating RN: Curtis Sites Primary Care Kiefer Opheim: Gabriel Cirri Other Clinician: Referring Sobia Karger: Gabriel Cirri Treating Lasonja Lakins/Extender: Altamese [place] in Treatment: 4 Active Inactive ` Abuse / Safety / Falls / Self Care Management Nursing Diagnoses: Potential for falls Goals: Patient will not experience any injury related to falls Date Initiated: 12/18/2016 Target Resolution Date: 01/24/2017 Goal Status: Active Interventions: Assess fall risk on admission and as needed Notes: ` Orientation to the Wound Care Program Nursing Diagnoses: Knowledge deficit related to the wound healing center program Goals: Patient/caregiver will verbalize understanding of the Wound Healing Center Program Date Initiated: 12/18/2016 Target Resolution Date: 01/24/2017 Goal Status: Active Interventions: Provide education on orientation to the wound center Notes: ` Venous Leg Ulcer Nursing Diagnoses: Actual venous Insuffiency (use after diagnosis is confirmed) CHARISE, LEINBACH ([id number]) Goals: Patient will maintain optimal edema control Date Initiated: 12/18/2016 Target Resolution Date: 01/24/2017 Goal Status: Active Interventions: Assess peripheral edema status every visit. Notes: ` Wound/Skin Impairment Nursing Diagnoses: Impaired tissue integrity Goals: Ulcer/skin breakdown  will have a volume reduction of 30% by week 4 Date Initiated: 12/18/2016 Target Resolution Date: 01/24/2017 Goal Status: Active Ulcer/skin breakdown will have a volume reduction of 50% by week 8 Date Initiated: 12/18/2016 Target Resolution Date: 01/24/2017 Goal Status: Active Ulcer/skin breakdown will have a volume reduction of 80% by week 12 Date Initiated: 12/18/2016 Target Resolution Date: 01/24/2017 Goal Status: Active Ulcer/skin breakdown will heal within 14 weeks Date Initiated: 12/18/2016 Target Resolution Date: 01/24/2017 Goal Status: Active Interventions: Assess patient/caregiver ability to obtain necessary supplies Assess patient/caregiver ability to perform ulcer/skin care regimen upon admission and as needed Assess ulceration(s) every visit Notes: Electronic Signature(s) Signed: 01/15/2017 5:39:07 PM By: Curtis Sites Entered By: Curtis Sites on 01/15/2017 14:53:12 Hynson, [name] -------------------------------------------------------------------------------- Pain Assessment Details Patient Name: [name] Date of Service: 01/15/2017 2:30 PM Medical Record Number: [id number] Patient Account Number: [id number] Date of Birth/Sex: Sep 19, 1940 (76 y.o. Female) Treating RN: Curtis Sites Primary Care Elysia Grand: Gabriel Cirri Other Clinician: Referring Artyom Stencel: Gabriel Cirri Treating  Nekoda Chock/Extender: Maxwell Caul Weeks in Treatment: 4 Active Problems Location of Pain Severity and Description of Pain Patient Has Paino No Site Locations Pain Management and Medication Current Pain Management: Notes Topical or injectable lidocaine is offered to patient for acute pain when surgical debridement is performed. If needed, Patient is instructed to use over the counter pain medication for the following 24-48 hours after debridement. Wound care MDs do not prescribed pain medications. Patient has chronic pain or uncontrolled pain. Patient has been instructed to make an  appointment with their Primary Care Physician for pain management. Electronic Signature(s) Signed: 01/15/2017 5:39:07 PM By: Curtis Sites Entered By: Curtis Sites on 01/15/2017 14:36:43 Speirs, [name] ([id number]) -------------------------------------------------------------------------------- Patient/Caregiver Education Details Patient Name: [name] Date of Service: 01/15/2017 2:30 PM Medical Record Patient Account Number: [id number] Number: Treating RN: Curtis Sites Aug 01, 1940 (76 y.o. Other Clinician: Date of Birth/Gender: Female) Treating ROBSON, MICHAEL Primary Care Physician: Gabriel Cirri Physician/Extender: G Referring Physician: Alfredo Martinez in Treatment: 4 Education Assessment Education Provided To: Patient Education Topics Provided Wound/Skin Impairment: Handouts: Other: wound care as ordered Methods: Explain/Verbal Responses: State content correctly Electronic Signature(s) Signed: 01/15/2017 5:39:07 PM By: Curtis Sites Entered By: Curtis Sites on 01/15/2017 17:06:54 Schlesinger, [name] ([id number]) -------------------------------------------------------------------------------- Wound Assessment Details Patient Name: [name] Date of Service: 01/15/2017 2:30 PM Medical Record Number: [id number] Patient Account Number: [id number] Date of Birth/Sex: 12/30/1940 (76 y.o. Female) Treating RN: Curtis Sites Primary Care Allyssa Abruzzese: Gabriel Cirri Other Clinician: Referring Defne Gerling: Gabriel Cirri Treating Zekiel Torian/Extender: Altamese [place] in Treatment: 4 Wound Status Wound Number: 3 Primary Venous Leg Ulcer Etiology: Wound Location: Left Lower Leg - Anterior Wound Status: Open Wounding Event: Trauma Comorbid Cataracts, Asthma, Arrhythmia, Date Acquired: 12/04/2016 History: Hypertension, Gout Weeks Of Treatment: 4 Clustered Wound: No Photos Photo Uploaded By: Curtis Sites on 01/16/2017 13:20:20 Wound Measurements Length:  (cm) 0.5 Width: (cm) 2.7 Depth: (cm) 0.2 Area: (cm) 1.06 Volume: (cm) 0.212 % Reduction in Area: 64.5% % Reduction in Volume: 88.2% Epithelialization: Small (1-33%) Tunneling: No Undermining: No Wound Description Full Thickness With Exposed Classification: Support Structures Wound Margin: Flat and Intact Exudate Large Amount: Exudate Type: Serous Exudate Color: amber Foul Odor After Cleansing: No Slough/Fibrino Yes Wound Bed Granulation Amount: Medium (34-66%) Exposed Structure Granulation Quality: Red Fascia Exposed: No Necrotic Amount: Medium (34-66%) Fat Layer (Subcutaneous Tissue) Exposed: Yes Gammage, Terina Judie Petit ([id number]) Necrotic Quality: Adherent Slough Tendon Exposed: No Muscle Exposed: No Joint Exposed: No Bone Exposed: No Periwound Skin Texture Texture Color No Abnormalities Noted: No No Abnormalities Noted: No Callus: No Atrophie Blanche: No Crepitus: No Cyanosis: No Excoriation: No Ecchymosis: Yes Induration: No Erythema: No Rash: No Hemosiderin Staining: No Scarring: No Mottled: No Pallor: No Moisture Rubor: No No Abnormalities Noted: No Dry / Scaly: No Temperature / Pain Maceration: No Temperature: No Abnormality Tenderness on Palpation: Yes Wound Preparation Ulcer Cleansing: Rinsed/Irrigated with Saline Topical Anesthetic Applied: Other: lidocaine 4%, Treatment Notes Wound #3 (Left, Anterior Lower Leg) 1. Cleansed with: Clean wound with Normal Saline 2. Anesthetic Topical Lidocaine 4% cream to wound bed prior to debridement 4. Dressing Applied: Aquacel Ag Other dressing (specify in notes) 5. Secondary Dressing Applied Non-Adherent pad Notes mupiricin on wound, netting to secure Electronic Signature(s) Signed: 01/15/2017 5:39:07 PM By: Curtis Sites Entered By: Curtis Sites on 01/15/2017 15:05:22 Daughdrill, [name] ([id number]) -------------------------------------------------------------------------------- Vitals  Details Patient Name: [name] Date of Service: 01/15/2017 2:30 PM Medical Record Number: [id number] Patient Account Number: [id number] Date  of Birth/Sex: 01/27/1941 (76 y.o. Female) Treating RN: Curtis Sitesorthy, Joanna Primary Care Dekota Kirlin: Gabriel CirriWICKER, [name] Other Clinician: Referring Jeannemarie Sawaya: Gabriel CirriWICKER, [name] Treating Tallie Hevia/Extender: Altamese CarolinaOBSON, MICHAEL G Weeks in Treatment: 4 Vital Signs Time Taken: 14:36 Temperature (F): 97.9 Height (in): 63 Pulse (bpm): 65 Weight (lbs): 331 Respiratory Rate (breaths/min): 20 Body Mass Index (BMI): 58.6 Blood Pressure (mmHg): 141/60 Reference Range: 80 - 120 mg / dl Electronic Signature(s) Signed: 01/15/2017 5:39:07 PM By: Curtis Sitesorthy, Joanna Entered By: Curtis Sitesorthy, Joanna on 01/15/2017 14:38:28

## 2017-01-17 NOTE — Progress Notes (Signed)
NITARA, SZCZERBA ([id number]) Visit Report for 01/15/2017 HPI Details Patient Name: [name], [name]. Date of Service: 01/15/2017 2:30 PM Medical Record Patient Account Number: [id number] Number: Treating RN: Curtis Sites 1941-01-19 (76 y.o. Other Clinician: Date of Birth/Sex: Female) Treating ROBSON, MICHAEL Primary Care Provider: Gabriel Cirri Provider/Extender: G Referring Provider: Alfredo Martinez in Treatment: 4 History of Present Illness Location: The patient has bilateral lower extremities swelling. she does have a small wound on the lower posterior aspect of her right lower extremity. Severity: the patient swelling and erythema has been moderate. Duration: she has had the lower extremity swelling for about 14 years now. She's noticed erythema in the last week or so. Context: marked lymphedema bilateral Modifying Factors: she has a medical grade compression stockings at home but she has never worn them because that she feels they're too tight. Associated Signs and Symptoms: she denies any fevers. HPI Description: The patient is a 76 year old female who presents with bilateral lower extremity swelling with right post calf ulcer and erythema. She has a long history of lower extremity swelling. She first noticed it in 2001. The patient returns for follow-up today. Her RLE erythema has subsided. 12/18/16; READMISSION this is a patient who is been in this clinic previously I think for a wound on the right lower extremity. She has a history of severe bilateral lower stamina and the lymphedema and also chronic venous insufficiency with inflammation [stasis dermatitis]. She wears 20-30 mm below-knee compression stockings and she is compliant with these. She also has external compression pumps at home but does not use these in fact she states that she doesn't think they actually work because of problems with the Velcro. 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 This immediately opened up and started bleeding. She was seen in the emergency room on 12/04/16 and she had 14 sutures placed. This was followed by primary care felt to have coexistent cellulitis. The sutures were removed on 11/21/16 and the wound was Steri-Stripped although the tissue has not adhered and she has an open wound currently. Lab work on 12/13/16 showed a white count of 5.7. At some point she was felt to have cellulitis and started on Biaxin she is allergic to penicillin, quinolones and sulfonamides. She has not been systemically unwell. She did not tolerate compression last time and tells me she had to come in every 3 days to have a rewrapped done. I've talked to her about this and said the best thing we can do is see her back once for a nurse visit. I have emphasized the need to get the swelling out of the left leg to give this wound the best chance to heal MARKESHA, HANNIG. ([id number]) we could not obtain ABIs in this clinic as the patient could not tolerate the compression 12/25/16 on evaluation today patient continues to have good toleration of the compression at this point. She is pleased with how things are progressing. Patient has no nausea, vomiting, or diarrhea and no fevers or chills. 01/01/17 on evaluation today patient appears to be doing fairly well in regard to her right lower extremity. At this time she seems to be having some issues with potential cellulitis of this right lower extremity unfortunately. She also has increased pain compared to the previous evaluation. That was one week ago. She wake her discomfort to be a 4-5 out of 10 that  is definitely worse with cleansing of the wound in particular. She has been tolerating the compression okay although at this point I'm not sure that we are gonna continue that due to the potential infection. We have been using a silver alginate dressing  up to this point. Patient wonders if there's anything else we should do going forward. No fevers, chills, nausea, or vomiting noted at this time. 01/08/17; leftlower extremity wound in the setting of very significant lymphedema. Culture last week grew Proteus. She is not on antibiotics. She is using Hydrofera Blue but instead of compression wraps is using her own compression stocking. She states she feels better and indeed the wound has continued to improve 01/15/17; left lower extremity wound in the setting of very significant lymphedema. This was initially a traumatic wound Electronic Signature(s) Signed: 01/15/2017 5:01:41 PM By: Baltazar Najjarobson, Michael MD Entered By: Baltazar Najjarobson, Michael on 01/15/2017 15:57:29 Hokenson, [name] -------------------------------------------------------------------------------- Physical Exam Details Patient Name: Barbara ApplebaumAY, [name] Date of Service: 01/15/2017 2:30 PM Medical Record Patient Account Number: [id number] Number: Treating RN: Curtis SitesDorthy, [name] 08/20/1940 (76 y.o. Other Clinician: Date of Birth/Sex: Female) Treating ROBSON, MICHAEL Primary Care Provider: Gabriel CirriWICKER, [name] Provider/Extender: G Referring Provider: Alfredo MartinezWICKER, [name] Weeks in Treatment: 4 Constitutional Sitting or standing Blood Pressure is within target range for patient.. Pulse regular and within target range for patient.Marland Kitchen. Respirations regular, non-labored and within target range.. Temperature is normal and within the target range for the patient.Marland Kitchen. appears in no distress. Eyes Conjunctivae clear. No discharge. Respiratory Respiratory effort is easy and symmetric bilaterally. Rate is normal at rest and on room air.. Cardiovascular Pedal pulses palpable and strong bilaterally.. Edema present in both extremities. Nonpitting lymphedema. Lymphatic None palpable in the popliteal or inguinal area. Psychiatric No evidence of depression, anxiety, or agitation. Calm, cooperative, and  communicative. Appropriate interactions and affect.. Notes Wound exam; patient has a clean granulated wound bed which is smaller than last week. There is no evidence of surrounding infection. No debridement was necessary. No evidence of cellulitis Electronic Signature(s) Signed: 01/15/2017 5:01:41 PM By: Baltazar Najjarobson, Michael MD Entered By: Baltazar Najjarobson, Michael on 01/15/2017 16:03:38 Ferencz, [name] ([id number]) -------------------------------------------------------------------------------- Physician Orders Details Patient Name: Barbara ApplebaumAY, Sanjana M. Date of Service: 01/15/2017 2:30 PM Medical Record Patient Account Number: [id number] Number: Treating RN: Curtis SitesDorthy, [name] 12/22/1940 (76 y.o. Other Clinician: Date of Birth/Sex: Female) Treating ROBSON, MICHAEL Primary Care Provider: Gabriel CirriWICKER, [name] Provider/Extender: G Referring Provider: Alfredo MartinezWICKER, [name] Weeks in Treatment: 4 Verbal / Phone Orders: No Diagnosis Coding Wound Cleansing Wound #3 Left,Anterior Lower Leg o Clean wound with Normal Saline. o May shower with protection. Anesthetic Wound #3 Left,Anterior Lower Leg o Topical Lidocaine 4% cream applied to wound bed prior to debridement Primary Wound Dressing Wound #3 Left,Anterior Lower Leg o Mupirocin Ointment o Aquacel Ag Secondary Dressing Wound #3 Left,Anterior Lower Leg o Other - telfa Dressing Change Frequency Wound #3 Left,Anterior Lower Leg o Change dressing every other day. Follow-up Appointments Wound #3 Left,Anterior Lower Leg o Return Appointment in 1 week. Edema Control Wound #3 Left,Anterior Lower Leg o Patient to wear own compression stockings o Elevate legs to the level of the heart and pump ankles as often as possible Additional Orders / Instructions Wound #3 Left,Anterior Lower Leg [place], Adelyna M. ([id number]) o Increase protein intake. Electronic Signature(s) Signed: 01/15/2017 5:01:41 PM By: Baltazar Najjarobson, Michael MD Signed: 01/15/2017 5:39:07 PM  By: Curtis Sitesorthy, [name] Entered By: Curtis Sitesorthy, [name] on 01/15/2017 15:07:15 Tapanes, [name] ([id number]) -------------------------------------------------------------------------------- Problem List Details Patient Name: Pennella,  [name] Date of Service: 01/15/2017 2:30 PM Medical Record Patient Account Number: [id number] Number: Treating RN: Curtis Sites [birth year] (76 y.o. Other Clinician: Date of Birth/Sex: Female) Treating ROBSON, MICHAEL Primary Care Provider: Gabriel Cirri Provider/Extender: G Referring Provider: Alfredo Martinez in Treatment: 4 Active Problems ICD-10 Encounter Code Description Active Date Diagnosis S81.812D Laceration without foreign body, left lower leg, subsequent 12/18/2016 Yes encounter I89.0 Lymphedema, not elsewhere classified 12/18/2016 Yes I87.322 Chronic venous hypertension (idiopathic) with 12/18/2016 Yes inflammation of left lower extremity Inactive Problems Resolved Problems Electronic Signature(s) Signed: 01/15/2017 5:01:41 PM By: Baltazar Najjar MD Entered By: Baltazar Najjar on 01/15/2017 15:52:16 Clutter, [name] ([id number]) -------------------------------------------------------------------------------- Progress Note Details Patient Name: Barbara Applebaum Date of Service: 01/15/2017 2:30 PM Medical Record Patient Account Number: [id number] Number: Treating RN: Curtis Sites 12-24-1940 (76 y.o. Other Clinician: Date of Birth/Sex: Female) Treating ROBSON, MICHAEL Primary Care Provider: Gabriel Cirri Provider/Extender: G Referring Provider: Alfredo Martinez in Treatment: 4 Subjective History of Present Illness (HPI) The following HPI elements were documented for the patient's wound: Location: The patient has bilateral lower extremities swelling. she does have a small wound on the lower posterior aspect of her right lower extremity. Severity: the patient swelling and erythema has been moderate. Duration: she has had the lower  extremity swelling for about 14 years now. She's noticed erythema in the last week or so. Context: marked lymphedema bilateral Modifying Factors: she has a medical grade compression stockings at home but she has never worn them because that she feels they're too tight. Associated Signs and Symptoms: she denies any fevers. The patient is a 76 year old female who presents with bilateral lower extremity swelling with right post calf ulcer and erythema. She has a long history of lower extremity swelling. She first noticed it in 2001. The patient returns for follow-up today. Her RLE erythema has subsided. 12/18/16; READMISSION this is a patient who is been in this clinic previously I think for a wound on the right lower extremity. She has a history of severe bilateral lower stamina and the lymphedema and also chronic venous insufficiency with inflammation [stasis dermatitis]. She wears 20-30 mm below-knee compression stockings and she is compliant with these. She also has external compression pumps at home but does not use these in fact she states that she doesn't think they actually work because of problems with the Velcro. 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 This immediately opened up and started bleeding. She was seen in the emergency room on 12/04/16 and she had 14 sutures placed. This was followed by primary care felt to have coexistent cellulitis. The sutures were removed on 11/21/16 and the wound was Steri-Stripped although the tissue has not adhered and she has an open wound currently. Lab work on 12/13/16 showed a white count of 5.7. At some point she was felt to have cellulitis and started on Biaxin she is allergic to penicillin, quinolones and sulfonamides. She has not been systemically unwell. She did not tolerate compression last time and tells me she had to come in every 3 days to have  a rewrapped done. I've talked to her about this and said the best thing we can do is see her back once for a nurse visit. I have emphasized the need to get the swelling out of the left leg to give this wound the best chance to heal Kratz, Verline  M. ([id number]) we could not obtain ABIs in this clinic as the patient could not tolerate the compression 12/25/16 on evaluation today patient continues to have good toleration of the compression at this point. She is pleased with how things are progressing. Patient has no nausea, vomiting, or diarrhea and no fevers or chills. 01/01/17 on evaluation today patient appears to be doing fairly well in regard to her right lower extremity. At this time she seems to be having some issues with potential cellulitis of this right lower extremity unfortunately. She also has increased pain compared to the previous evaluation. That was one week ago. She wake her discomfort to be a 4-5 out of 10 that is definitely worse with cleansing of the wound in particular. She has been tolerating the compression okay although at this point I'm not sure that we are gonna continue that due to the potential infection. We have been using a silver alginate dressing up to this point. Patient wonders if there's anything else we should do going forward. No fevers, chills, nausea, or vomiting noted at this time. 01/08/17; leftlower extremity wound in the setting of very significant lymphedema. Culture last week grew Proteus. She is not on antibiotics. She is using Hydrofera Blue but instead of compression wraps is using her own compression stocking. She states she feels better and indeed the wound has continued to improve 01/15/17; left lower extremity wound in the setting of very significant lymphedema. This was initially a traumatic wound Objective Constitutional Sitting or standing Blood Pressure is within target range for patient.. Pulse regular and within target range for patient.Marland Kitchen  Respirations regular, non-labored and within target range.. Temperature is normal and within the target range for the patient.Marland Kitchen appears in no distress. Vitals Time Taken: 2:36 PM, Height: 63 in, Weight: 331 lbs, BMI: 58.6, Temperature: 97.9 F, Pulse: 65 bpm, Respiratory Rate: 20 breaths/min, Blood Pressure: 141/60 mmHg. Eyes Conjunctivae clear. No discharge. Respiratory Respiratory effort is easy and symmetric bilaterally. Rate is normal at rest and on room air.. Cardiovascular Pedal pulses palpable and strong bilaterally.. Edema present in both extremities. Nonpitting lymphedema. Lymphatic None palpable in the popliteal or inguinal area. CINDI, GHAZARIAN ([id number]) Psychiatric No evidence of depression, anxiety, or agitation. Calm, cooperative, and communicative. Appropriate interactions and affect.. General Notes: Wound exam; patient has a clean granulated wound bed which is smaller than last week. There is no evidence of surrounding infection. No debridement was necessary. No evidence of cellulitis Integumentary (Hair, Skin) Wound #3 status is Open. Original cause of wound was Trauma. The wound is located on the Left,Anterior Lower Leg. The wound measures 0.5cm length x 2.7cm width x 0.2cm depth; 1.06cm^2 area and 0.212cm^3 volume. There is Fat Layer (Subcutaneous Tissue) Exposed exposed. There is no tunneling or undermining noted. There is a large amount of serous drainage noted. The wound margin is flat and intact. There is medium (34-66%) red granulation within the wound bed. There is a medium (34-66%) amount of necrotic tissue within the wound bed including Adherent Slough. The periwound skin appearance exhibited: Ecchymosis. The periwound skin appearance did not exhibit: Callus, Crepitus, Excoriation, Induration, Rash, Scarring, Dry/Scaly, Maceration, Atrophie Blanche, Cyanosis, Hemosiderin Staining, Mottled, Pallor, Rubor, Erythema. Periwound temperature was noted as No  Abnormality. The periwound has tenderness on palpation. Assessment Active Problems ICD-10 S81.812D - Laceration without foreign body, left lower leg, subsequent encounter I89.0 - Lymphedema, not elsewhere classified I87.322 - Chronic venous hypertension (idiopathic) with inflammation of left lower extremity Plan Wound Cleansing: Wound #3  Left,Anterior Lower Leg: Clean wound with Normal Saline. May shower with protection. Anesthetic: Wound #3 Left,Anterior Lower Leg: Topical Lidocaine 4% cream applied to wound bed prior to debridement Primary Wound Dressing: Wound #3 Left,Anterior Lower Leg: Mupirocin Ointment Sigg, Nyema M. ([id number]) Aquacel Ag Secondary Dressing: Wound #3 Left,Anterior Lower Leg: Other - telfa Dressing Change Frequency: Wound #3 Left,Anterior Lower Leg: Change dressing every other day. Follow-up Appointments: Wound #3 Left,Anterior Lower Leg: Return Appointment in 1 week. Edema Control: Wound #3 Left,Anterior Lower Leg: Patient to wear own compression stockings Elevate legs to the level of the heart and pump ankles as often as possible Additional Orders / Instructions: Wound #3 Left,Anterior Lower Leg: Increase protein intake. #1 continue silver alginate, Telfa Electronic Signature(s) Signed: 01/15/2017 5:01:41 PM By: Baltazar Najjar MD Entered By: Baltazar Najjar on 01/15/2017 16:04:40 Demers, [name] ([id number]) -------------------------------------------------------------------------------- SuperBill Details Patient Name: Barbara Applebaum Date of Service: 01/15/2017 Medical Record Patient Account Number: [id number] Number: Treating RN: Curtis Sites 11/09/1940 (76 y.o. Other Clinician: Date of Birth/Sex: Female) Treating ROBSON, MICHAEL Primary Care Provider: Gabriel Cirri Provider/Extender: G Referring Provider: Alfredo Martinez in Treatment: 4 Diagnosis Coding ICD-10 Codes Code Description S81.812D Laceration without foreign  body, left lower leg, subsequent encounter I89.0 Lymphedema, not elsewhere classified I87.322 Chronic venous hypertension (idiopathic) with inflammation of left lower extremity Facility Procedures CPT4 Code: [id number] Description: 99213 - WOUND CARE VISIT-LEV 3 EST PT Modifier: Quantity: 1 Physician Procedures CPT4 Code Description: [id number] 99213 - WC PHYS LEVEL 3 - EST PT ICD-10 Description Diagnosis S81.812D Laceration without foreign body, left lower leg, s I89.0 Lymphedema, not elsewhere classified Modifier: ubsequent encou Quantity: 1 nter Electronic Signature(s) Signed: 01/15/2017 5:04:22 PM By: Curtis Sites Previous Signature: 01/15/2017 5:01:41 PM Version By: Baltazar Najjar MD Entered By: Curtis Sites on 01/15/2017 17:04:21

## 2017-01-22 ENCOUNTER — Encounter: Payer: BC Managed Care – PPO | Attending: Internal Medicine | Admitting: Internal Medicine

## 2017-01-22 DIAGNOSIS — Z882 Allergy status to sulfonamides status: Secondary | ICD-10-CM | POA: Diagnosis not present

## 2017-01-22 DIAGNOSIS — Z880 Allergy status to penicillin: Secondary | ICD-10-CM | POA: Diagnosis not present

## 2017-01-22 DIAGNOSIS — I890 Lymphedema, not elsewhere classified: Secondary | ICD-10-CM | POA: Diagnosis not present

## 2017-01-22 DIAGNOSIS — J45909 Unspecified asthma, uncomplicated: Secondary | ICD-10-CM | POA: Insufficient documentation

## 2017-01-22 DIAGNOSIS — M109 Gout, unspecified: Secondary | ICD-10-CM | POA: Diagnosis not present

## 2017-01-22 DIAGNOSIS — I10 Essential (primary) hypertension: Secondary | ICD-10-CM | POA: Insufficient documentation

## 2017-01-22 DIAGNOSIS — X58XXXD Exposure to other specified factors, subsequent encounter: Secondary | ICD-10-CM | POA: Insufficient documentation

## 2017-01-22 DIAGNOSIS — S81812D Laceration without foreign body, left lower leg, subsequent encounter: Secondary | ICD-10-CM | POA: Diagnosis not present

## 2017-01-22 DIAGNOSIS — I87322 Chronic venous hypertension (idiopathic) with inflammation of left lower extremity: Secondary | ICD-10-CM | POA: Insufficient documentation

## 2017-01-22 DIAGNOSIS — S81802A Unspecified open wound, left lower leg, initial encounter: Secondary | ICD-10-CM | POA: Diagnosis not present

## 2017-01-24 NOTE — Progress Notes (Signed)
CONI, HOMESLEY ([id number]) Visit Report for 01/22/2017 HPI Details Patient Name: [name], [name]. Date of Service: 01/22/2017 3:30 PM Medical Record Patient Account Number: [id number] Number: Treating RN: [name] 03-09-1941 (76 y.o. Other Clinician: Date of Birth/Sex: Female) Treating [name], Barbara Primary Care Provider: Gabriel [name] Provider/Extender: G Referring Provider: Alfredo [name] in Treatment: 5 History of Present Illness Location: The patient has bilateral lower extremities swelling. she does have a small wound on the lower posterior aspect of her right lower extremity. Severity: the patient swelling and erythema has been moderate. Duration: she has had the lower extremity swelling for about 14 years now. She's noticed erythema in the last week or so. Context: marked lymphedema bilateral Modifying Factors: she has a medical grade compression stockings at home but she has never worn them because that she feels they're too tight. Associated Signs and Symptoms: she denies any fevers. HPI Description: The patient is a 76 year old female who presents with bilateral lower extremity swelling with right post calf ulcer and erythema. She has a long history of lower extremity swelling. She first noticed it in 2001. The patient returns for follow-up today. Her RLE erythema has subsided. 12/18/16; READMISSION this is a patient who is been in this clinic previously I think for a wound on the right lower extremity. She has a history of severe bilateral lower stamina and the lymphedema and also chronic venous insufficiency with inflammation [stasis dermatitis]. She wears 20-30 mm below-knee compression stockings and she is compliant with these. She also has external compression pumps at home but does not use these in fact she states that she doesn't think they actually work because of problems with the Velcro. 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 This immediately opened up and started bleeding. She was seen in the emergency room on 12/04/16 and she had 14 sutures placed. This was followed by primary care felt to have coexistent cellulitis. The sutures were removed on 11/21/16 and the wound was Steri-Stripped although the tissue has not adhered and she has an open wound currently. Lab work on 12/13/16 showed a white count of 5.7. At some point she was felt to have cellulitis and started on Biaxin she is allergic to penicillin, quinolones and sulfonamides. She has not been systemically unwell. She did not tolerate compression last time and tells me she had to come in every 3 days to have a rewrapped done. I've talked to her about this and said the best thing we can do is see her back once for a nurse visit. I have emphasized the need to get the swelling out of the left leg to give this wound the best chance to heal [name]. ([id number]) we could not obtain ABIs in this clinic as the patient could not tolerate the compression 12/25/16 on evaluation today patient continues to have good toleration of the compression at this point. She is pleased with how things are progressing. Patient has no nausea, vomiting, or diarrhea and no fevers or chills. 01/01/17 on evaluation today patient appears to be doing fairly well in regard to her right lower extremity. At this time she seems to be having some issues with potential cellulitis of this right lower extremity unfortunately. She also has increased pain compared to the previous evaluation. That was one week ago. She wake her discomfort to be a 4-5 out of 10 that  is definitely worse with cleansing of the wound in particular. She has been tolerating the compression okay although at this point I'm not sure that we are gonna continue that due to the potential infection. We have been using a silver alginate dressing up to  this point. Patient wonders if there's anything else we should do going forward. No fevers, chills, nausea, or vomiting noted at this time. 01/08/17; leftlower extremity wound in the setting of very significant lymphedema. Culture last week grew Proteus. She is not on antibiotics. She is using Hydrofera Blue but instead of compression wraps is using her own compression stocking. She states she feels better and indeed the wound has continued to improve 01/15/17; left lower extremity wound in the setting of very significant lymphedema. This was initially a traumatic wound 9/I/18; left lower extremity anteriorly in the setting of very significant lymphedema. This was initially a traumatic wound. This is making good progress Electronic Signature(s) Signed: 01/22/2017 5:15:21 PM By: [name], [name] Entered By: [name], Barbara on 01/22/2017 17:01:30 Schmoker, Barbara SearingBETTY M. ([id number]) -------------------------------------------------------------------------------- Physical Exam Details Patient Name: Barbara ApplebaumAY, [name] Date of Service: 01/22/2017 3:30 PM Medical Record Patient Account Number: [id number] Number: Treating RN: Barbara SitesDorthy, [name] 05/31/1940 (76 y.o. Other Clinician: Date of Birth/Sex: Female) Treating [name], Barbara Primary Care Provider: Gabriel CirriWICKER, [name] Provider/Extender: G Referring Provider: Alfredo MartinezWICKER, [name] Weeks in Treatment: 5 Constitutional Patient is hypertensive.. Pulse regular and within target range for patient.Marland Kitchen. Respirations regular, non-labored and within target range.. Temperature is normal and within the target range for the patient.Marland Kitchen. appears in no distress. Eyes Conjunctivae clear. No discharge. Respiratory Respiratory effort is easy and symmetric bilaterally. Rate is normal at rest and on room air.. Cardiovascular Pedal pulses palpable and strong bilaterally.. Edema present in both extremities. Severe bilateral lymphedema which extends into her thighs. Lymphatic None  palpable in the popliteal or inguinal area. Psychiatric No evidence of depression, anxiety, or agitation. Calm, cooperative, and communicative. Appropriate interactions and affect.. Notes Wound exam; fairly benign looking granulated wound on the left anterior leg. This looks like it is contracting nicely and should be closed in a week or 2. No debridement was necessary. There is no evidence of surrounding infection Electronic Signature(s) Signed: 01/22/2017 5:15:21 PM By: [name], [name] Entered By: [name], Barbara on 01/22/2017 17:02:45 Forster, Barbara SearingBETTY M. ([id number]) -------------------------------------------------------------------------------- Physician Orders Details Patient Name: Barbara ApplebaumAY, [name] Date of Service: 01/22/2017 3:30 PM Medical Record Patient Account Number: [id number] Number: Treating RN: Barbara SitesDorthy, [name] 05/31/1940 (76 y.o. Other Clinician: Date of Birth/Sex: Female) Treating [name], Barbara Primary Care Provider: Gabriel CirriWICKER, [name] Provider/Extender: G Referring Provider: Alfredo MartinezWICKER, [name] Weeks in Treatment: 5 Verbal / Phone Orders: No Diagnosis Coding Wound Cleansing Wound #3 Left,Anterior Lower Leg o Clean wound with Normal Saline. o May shower with protection. Anesthetic Wound #3 Left,Anterior Lower Leg o Topical Lidocaine 4% cream applied to wound bed prior to debridement Primary Wound Dressing Wound #3 Left,Anterior Lower Leg o Mupirocin Ointment o Aquacel Ag Secondary Dressing Wound #3 Left,Anterior Lower Leg o Other - telfa Dressing Change Frequency Wound #3 Left,Anterior Lower Leg o Change dressing every other day. Follow-up Appointments Wound #3 Left,Anterior Lower Leg o Return Appointment in 1 week. Edema Control Wound #3 Left,Anterior Lower Leg o Patient to wear own compression stockings o Elevate legs to the level of the heart and pump ankles as often as possible Additional Orders / Instructions Wound #3 Left,Anterior  Lower Leg Poulton, [name] ([id number]) o Increase protein intake. Electronic Signature(s) Signed: 01/22/2017 5:15:21  PM By: Barbara Najjar [name] Signed: 01/22/2017 5:49:39 PM By: [name] Entered By: [name] on 01/22/2017 16:00:43 Shane, [name] ([id number]) -------------------------------------------------------------------------------- Problem List Details Patient Name: [name] Date of Service: 01/22/2017 3:30 PM Medical Record Patient Account Number: [id number] Number: Treating RN: [name] 19-May-1941 (76 y.o. Other Clinician: Date of Birth/Sex: Female) Treating [name], Barbara Primary Care Provider: Gabriel [name] Provider/Extender: G Referring Provider: Alfredo [name] in Treatment: 5 Active Problems ICD-10 Encounter Code Description Active Date Diagnosis S81.812D Laceration without foreign body, left lower leg, subsequent 12/18/2016 Yes encounter I89.0 Lymphedema, not elsewhere classified 12/18/2016 Yes I87.322 Chronic venous hypertension (idiopathic) with 12/18/2016 Yes inflammation of left lower extremity Inactive Problems Resolved Problems Electronic Signature(s) Signed: 01/22/2017 5:15:21 PM By: Barbara Najjar [name] Entered By: Barbara Najjar on 01/22/2017 17:00:32 Parthasarathy, [name] ([id number]) -------------------------------------------------------------------------------- Progress Note Details Patient Name: [name] Date of Service: 01/22/2017 3:30 PM Medical Record Patient Account Number: [id number] Number: Treating RN: [name] 20-Feb-1941 (76 y.o. Other Clinician: Date of Birth/Sex: Female) Treating [name], Barbara Primary Care Provider: Gabriel [name] Provider/Extender: G Referring Provider: Alfredo [name] in Treatment: 5 Subjective History of Present Illness (HPI) The following HPI elements were documented for the patient's wound: Location: The patient has bilateral lower extremities swelling. she does have a  small wound on the lower posterior aspect of her right lower extremity. Severity: the patient swelling and erythema has been moderate. Duration: she has had the lower extremity swelling for about 14 years now. She's noticed erythema in the last week or so. Context: marked lymphedema bilateral Modifying Factors: she has a medical grade compression stockings at home but she has never worn them because that she feels they're too tight. Associated Signs and Symptoms: she denies any fevers. The patient is a 76 year old female who presents with bilateral lower extremity swelling with right post calf ulcer and erythema. She has a long history of lower extremity swelling. She first noticed it in 2001. The patient returns for follow-up today. Her RLE erythema has subsided. 12/18/16; READMISSION this is a patient who is been in this clinic previously I think for a wound on the right lower extremity. She has a history of severe bilateral lower stamina and the lymphedema and also chronic venous insufficiency with inflammation [stasis dermatitis]. She wears 20-30 mm below-knee compression stockings and she is compliant with these. She also has external compression pumps at home but does not use these in fact she states that she doesn't think they actually work because of problems with the Velcro. 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 This immediately opened up and started bleeding. She was seen in the emergency room on 12/04/16 and she had 14 sutures placed. This was followed by primary care felt to have coexistent cellulitis. The sutures were removed on 11/21/16 and the wound was Steri-Stripped although the tissue has not adhered and she has an open wound currently. Lab work on 12/13/16 showed a white count of 5.7. At some point she was felt to have cellulitis and started on Biaxin she is allergic to  penicillin, quinolones and sulfonamides. She has not been systemically unwell. She did not tolerate compression last time and tells me she had to come in every 3 days to have a rewrapped done. I've talked to her about this and said the best thing we can do is see her back  once for a nurse visit. I have emphasized the need to get the swelling out of the left leg to give this wound the best chance to heal [name], [name]. ([id number]) we could not obtain ABIs in this clinic as the patient could not tolerate the compression 12/25/16 on evaluation today patient continues to have good toleration of the compression at this point. She is pleased with how things are progressing. Patient has no nausea, vomiting, or diarrhea and no fevers or chills. 01/01/17 on evaluation today patient appears to be doing fairly well in regard to her right lower extremity. At this time she seems to be having some issues with potential cellulitis of this right lower extremity unfortunately. She also has increased pain compared to the previous evaluation. That was one week ago. She wake her discomfort to be a 4-5 out of 10 that is definitely worse with cleansing of the wound in particular. She has been tolerating the compression okay although at this point I'm not sure that we are gonna continue that due to the potential infection. We have been using a silver alginate dressing up to this point. Patient wonders if there's anything else we should do going forward. No fevers, chills, nausea, or vomiting noted at this time. 01/08/17; leftlower extremity wound in the setting of very significant lymphedema. Culture last week grew Proteus. She is not on antibiotics. She is using Hydrofera Blue but instead of compression wraps is using her own compression stocking. She states she feels better and indeed the wound has continued to improve 01/15/17; left lower extremity wound in the setting of very significant lymphedema. This was initially  a traumatic wound 9/I/18; left lower extremity anteriorly in the setting of very significant lymphedema. This was initially a traumatic wound. This is making good progress Objective Constitutional Patient is hypertensive.. Pulse regular and within target range for patient.Marland Kitchen Respirations regular, non-labored and within target range.. Temperature is normal and within the target range for the patient.Marland Kitchen appears in no distress. Vitals Time Taken: 3:36 PM, Height: 63 in, Weight: 331 lbs, BMI: 58.6, Temperature: 98.4 F, Pulse: 73 bpm, Respiratory Rate: 20 breaths/min, Blood Pressure: 150/86 mmHg. Eyes Conjunctivae clear. No discharge. Respiratory Respiratory effort is easy and symmetric bilaterally. Rate is normal at rest and on room air.. Cardiovascular Pedal pulses palpable and strong bilaterally.. Edema present in both extremities. Severe bilateral lymphedema which extends into her thighs. [name], [name]. ([id number]) Lymphatic None palpable in the popliteal or inguinal area. Psychiatric No evidence of depression, anxiety, or agitation. Calm, cooperative, and communicative. Appropriate interactions and affect.. General Notes: Wound exam; fairly benign looking granulated wound on the left anterior leg. This looks like it is contracting nicely and should be closed in a week or 2. No debridement was necessary. There is no evidence of surrounding infection Integumentary (Hair, Skin) Wound #3 status is Open. Original cause of wound was Trauma. The wound is located on the Left,Anterior Lower Leg. The wound measures 0.3cm length x 2.3cm width x 0.1cm depth; 0.542cm^2 area and 0.054cm^3 volume. There is Fat Layer (Subcutaneous Tissue) Exposed exposed. There is no tunneling or undermining noted. There is a large amount of serous drainage noted. The wound margin is flat and intact. There is medium (34-66%) red granulation within the wound bed. There is a medium (34-66%) amount of necrotic tissue  within the wound bed including Adherent Slough. The periwound skin appearance exhibited: Ecchymosis. The periwound skin appearance did not exhibit: Callus, Crepitus, Excoriation, Induration, Rash, Scarring, Dry/Scaly, Maceration,  Atrophie Blanche, Cyanosis, Hemosiderin Staining, Mottled, Pallor, Rubor, Erythema. Periwound temperature was noted as No Abnormality. The periwound has tenderness on palpation. Assessment Active Problems ICD-10 S81.812D - Laceration without foreign body, left lower leg, subsequent encounter I89.0 - Lymphedema, not elsewhere classified I87.322 - Chronic venous hypertension (idiopathic) with inflammation of left lower extremity Plan Wound Cleansing: Wound #3 Left,Anterior Lower Leg: Clean wound with Normal Saline. May shower with protection. Anesthetic: Wound #3 Left,Anterior Lower Leg: [name], [name]. ([id number]) Topical Lidocaine 4% cream applied to wound bed prior to debridement Primary Wound Dressing: Wound #3 Left,Anterior Lower Leg: Mupirocin Ointment Aquacel Ag Secondary Dressing: Wound #3 Left,Anterior Lower Leg: Other - telfa Dressing Change Frequency: Wound #3 Left,Anterior Lower Leg: Change dressing every other day. Follow-up Appointments: Wound #3 Left,Anterior Lower Leg: Return Appointment in 1 week. Edema Control: Wound #3 Left,Anterior Lower Leg: Patient to wear own compression stockings Elevate legs to the level of the heart and pump ankles as often as possible Additional Orders / Instructions: Wound #3 Left,Anterior Lower Leg: Increase protein intake. #1 the patient has compression pumps at home that we apparently ordered 2 years ago however at least one of the sleeves is not working. He gave him the name of the company and she will contact in order to see if this can be repaired. #2 we also talked at length about stocking options. She has some half calf stocking she apparently ordered from [employer] at considerable expense. She states  $300 although this isn't going to help her underlying lymphedema #3 she would be eligible for either juxta lite or extremieese stockings I think these would help along with her compression pumps. #4 continue silver alginate over Telfa Electronic Signature(s) Signed: 01/22/2017 5:15:21 PM By: Barbara Najjar [name] Entered By: Barbara Najjar on 01/22/2017 17:05:01 Sachs, [name] ([id number]) -------------------------------------------------------------------------------- SuperBill Details Patient Name: [name] Date of Service: 01/22/2017 Medical Record Patient Account Number: [id number] Number: Treating RN: [name] 1940-09-07 (76 y.o. Other Clinician: Date of Birth/Sex: Female) Treating [name], Barbara Primary Care Provider: Gabriel [name] Provider/Extender: G Referring Provider: Alfredo [name] in Treatment: 5 Diagnosis Coding ICD-10 Codes Code Description S81.812D Laceration without foreign body, left lower leg, subsequent encounter I89.0 Lymphedema, not elsewhere classified I87.322 Chronic venous hypertension (idiopathic) with inflammation of left lower extremity Facility Procedures CPT4 Code: [id number] Description: 99213 - WOUND CARE VISIT-LEV 3 EST PT Modifier: Quantity: 1 Physician Procedures CPT4 Code Description: [id number] 99213 - WC PHYS LEVEL 3 - EST PT ICD-10 Description Diagnosis S81.812D Laceration without foreign body, left lower leg, s I89.0 Lymphedema, not elsewhere classified Modifier: ubsequent encou Quantity: 1 nter Electronic Signature(s) Signed: 01/22/2017 5:15:21 PM By: Barbara Najjar [name] Entered By: Barbara Najjar on 01/22/2017 17:05:22

## 2017-01-24 NOTE — Progress Notes (Signed)
[name], [name] ([id number]) Visit Report for 01/22/2017 Arrival Information Details Patient Name: [name], [name] Date of Service: 01/22/2017 3:30 PM Medical Record Number: [id number] Patient Account Number: [id number] Date of Birth/Sex: 03/31/1941 (76 y.o. Female) Treating RN: Curtis Sitesorthy, Joanna Primary Care Leonore Frankson: Gabriel CirriWICKER, [name] Other Clinician: Referring Tyja Gortney: Gabriel CirriWICKER, [name] Treating Logen Fowle/Extender: Altamese CarolinaOBSON, MICHAEL G Weeks in Treatment: 5 Visit Information History Since Last Visit Added or deleted any medications: No Patient Arrived: Walker Any new allergies or adverse reactions: No Arrival Time: 15:35 Had a fall or experienced change in No Accompanied By: self activities of daily living that may affect Transfer Assistance: None risk of falls: Patient Identification Verified: Yes Signs or symptoms of abuse/neglect since last No Secondary Verification Process Completed: Yes visito Patient Requires Transmission-Based No Hospitalized since last visit: No Precautions: Has Dressing in Place as Prescribed: Yes Patient Has Alerts: No Has Compression in Place as Prescribed: Yes Pain Present Now: No Electronic Signature(s) Signed: 01/22/2017 5:49:39 PM By: Curtis Sitesorthy, Joanna Entered By: Curtis Sitesorthy, Joanna on 01/22/2017 15:35:33 Polimeni, Barbara SearingBETTY M. ([id number]) -------------------------------------------------------------------------------- Clinic Level of Care Assessment Details Patient Name: [name], [name] Date of Service: 01/22/2017 3:30 PM Medical Record Number: [id number] Patient Account Number: [id number] Date of Birth/Sex: 12/19/1940 (76 y.o. Female) Treating RN: Curtis Sitesorthy, Joanna Primary Care Harvey Matlack: Gabriel CirriWICKER, [name] Other Clinician: Referring Tysheem Accardo: Gabriel CirriWICKER, [name] Treating Shabazz Mckey/Extender: Altamese CarolinaOBSON, MICHAEL G Weeks in Treatment: 5 Clinic Level of Care Assessment Items TOOL 4 Quantity Score []  - Use when only an EandM is performed on FOLLOW-UP visit 0 ASSESSMENTS - Nursing Assessment /  Reassessment X - Reassessment of Co-morbidities (includes updates in patient status) 1 10 X - Reassessment of Adherence to Treatment Plan 1 5 ASSESSMENTS - Wound and Skin Assessment / Reassessment X - Simple Wound Assessment / Reassessment - one wound 1 5 []  - Complex Wound Assessment / Reassessment - multiple wounds 0 []  - Dermatologic / Skin Assessment (not related to wound area) 0 ASSESSMENTS - Focused Assessment X - Circumferential Edema Measurements - multi extremities 1 5 []  - Nutritional Assessment / Counseling / Intervention 0 X - Lower Extremity Assessment (monofilament, tuning fork, pulses) 1 5 []  - Peripheral Arterial Disease Assessment (using hand held doppler) 0 ASSESSMENTS - Ostomy and/or Continence Assessment and Care []  - Incontinence Assessment and Management 0 []  - Ostomy Care Assessment and Management (repouching, etc.) 0 PROCESS - Coordination of Care X - Simple Patient / Family Education for ongoing care 1 15 []  - Complex (extensive) Patient / Family Education for ongoing care 0 []  - Staff obtains ChiropractorConsents, Records, Test Results / Process Orders 0 []  - Staff telephones HHA, Nursing Homes / Clarify orders / etc 0 []  - Routine Transfer to another Facility (non-emergent condition) 0 [name], [name] ([id number]) []  - Routine Hospital Admission (non-emergent condition) 0 []  - New Admissions / Manufacturing engineernsurance Authorizations / Ordering NPWT, Apligraf, etc. 0 []  - Emergency Hospital Admission (emergent condition) 0 X - Simple Discharge Coordination 1 10 []  - Complex (extensive) Discharge Coordination 0 PROCESS - Special Needs []  - Pediatric / Minor Patient Management 0 []  - Isolation Patient Management 0 []  - Hearing / Language / Visual special needs 0 []  - Assessment of Community assistance (transportation, D/C planning, etc.) 0 []  - Additional assistance / Altered mentation 0 []  - Support Surface(s) Assessment (bed, cushion, seat, etc.) 0 INTERVENTIONS - Wound Cleansing /  Measurement X - Simple Wound Cleansing - one wound 1 5 []  - Complex Wound Cleansing - multiple wounds 0 X - Wound Imaging (  photographs - any number of wounds) 1 5  - Wound Tracing (instead of photographs) 0 X - Simple Wound Measurement - one wound 1 5  - Complex Wound Measurement - multiple wounds 0 INTERVENTIONS - Wound Dressings X - Small Wound Dressing one or multiple wounds 1 10  - Medium Wound Dressing one or multiple wounds 0  - Large Wound Dressing one or multiple wounds 0 X - Application of Medications - topical 1 5  - Application of Medications - injection 0 INTERVENTIONS - Miscellaneous  - External ear exam 0 Bufano, [name] ([id number])  - Specimen Collection (cultures, biopsies, blood, body fluids, etc.) 0  - Specimen(s) / Culture(s) sent or taken to Lab for analysis 0  - Patient Transfer (multiple staff / Michiel Sites Lift / Similar devices) 0  - Simple Staple / Suture removal (25 or less) 0  - Complex Staple / Suture removal (26 or more) 0  - Hypo / Hyperglycemic Management (close monitor of Blood Glucose) 0  - Ankle / Brachial Index (ABI) - do not check if billed separately 0 X - Vital Signs 1 5 Has the patient been seen at the hospital within the last three years: Yes Total Score: 90 Level Of Care: New/Established - Level 3 Electronic Signature(s) Signed: 01/22/2017 5:49:39 PM By: Curtis Sites Entered By: Curtis Sites on 01/22/2017 17:00:53 Iannaccone, [name] ([id number]) -------------------------------------------------------------------------------- Encounter Discharge Information Details Patient Name: [name] Date of Service: 01/22/2017 3:30 PM Medical Record Number: [id number] Patient Account Number: [id number] Date of Birth/Sex: July 13, 1940 (76 y.o. Female) Treating RN: Curtis Sites Primary Care Adonus Uselman: Gabriel Cirri Other Clinician: Referring Micayla Brathwaite: Gabriel Cirri Treating Delylah Stanczyk/Extender: Altamese [place] in Treatment:  5 Encounter Discharge Information Items Discharge Pain Level: 0 Discharge Condition: Stable Ambulatory Status: Walker Discharge Destination: Home Transportation: Private Auto Accompanied By: self Schedule Follow-up Appointment: Yes Medication Reconciliation completed No and provided to Patient/Care Durinda Buzzelli: Provided on Clinical Summary of Care: 01/22/2017 Form Type Recipient Paper Patient BR Electronic Signature(s) Signed: 01/22/2017 5:01:52 PM By: Curtis Sites Entered By: Curtis Sites on 01/22/2017 17:01:52 [name], [name] ([id number]) -------------------------------------------------------------------------------- Lower Extremity Assessment Details Patient Name: [name] Date of Service: 01/22/2017 3:30 PM Medical Record Number: [id number] Patient Account Number: [id number] Date of Birth/Sex: 1940/07/12 (76 y.o. Female) Treating RN: Curtis Sites Primary Care Aadan Chenier: Gabriel Cirri Other Clinician: Referring Deja Kaigler: Gabriel Cirri Treating Antoneo Ghrist/Extender: Altamese [place] in Treatment: 5 Edema Assessment Assessed: [Left: No] [Right: No] E[Left: dema] [Right: :] Calf Left: Right: Point of Measurement: 30 cm From Medial Instep 60 cm cm Ankle Left: Right: Point of Measurement: 7 cm From Medial Instep 32.4 cm cm Vascular Assessment Pulses: Dorsalis Pedis Palpable: [Left:Yes] Posterior Tibial Extremity colors, hair growth, and conditions: Extremity Color: [Left:Normal] Hair Growth on Extremity: [Left:No] Temperature of Extremity: [Left:Warm] Capillary Refill: [Left:< 3 seconds] Electronic Signature(s) Signed: 01/22/2017 5:49:39 PM By: Curtis Sites Entered By: Curtis Sites on 01/22/2017 15:44:30 Radell, [name] ([id number]) -------------------------------------------------------------------------------- Multi Wound Chart Details Patient Name: [name] Date of Service: 01/22/2017 3:30 PM Medical Record Number: [id number] Patient Account Number:  [id number] Date of Birth/Sex: 12-Mar-1941 (76 y.o. Female) Treating RN: Curtis Sites Primary Care Elhadj Girton: Gabriel Cirri Other Clinician: Referring Jaythan Hinely: Gabriel Cirri Treating Huston Stonehocker/Extender: Altamese  in Treatment: 5 Vital Signs Height(in): 63 Pulse(bpm): 73 Weight(lbs): 331 Blood Pressure 150/86 (mmHg): Body Mass Index(BMI): 59 Temperature(F): 98.4 Respiratory Rate 20 (breaths/min): Photos: [N/A:N/A] Wound Location: Left Lower Leg - Anterior N/A N/A Wounding Event: Trauma N/A  N/A Primary Etiology: Venous Leg Ulcer N/A N/A Comorbid History: Cataracts, Asthma, N/A N/A Arrhythmia, Hypertension, Gout Date Acquired: 12/04/2016 N/A N/A Weeks of Treatment: 5 N/A N/A Wound Status: Open N/A N/A Measurements L x W x D 0.3x2.3x0.1 N/A N/A (cm) Area (cm) : 0.542 N/A N/A Volume (cm) : 0.054 N/A N/A % Reduction in Area: 81.80% N/A N/A % Reduction in Volume: 97.00% N/A N/A Classification: Full Thickness With N/A N/A Exposed Support Structures Exudate Amount: Large N/A N/A Exudate Type: Serous N/A N/A Exudate Color: amber N/A N/A Wound Margin: Flat and Intact N/A N/A Granulation Amount: Medium (34-66%) N/A N/A [name], [name] ([id number]) Granulation Quality: Red N/A N/A Necrotic Amount: Medium (34-66%) N/A N/A Exposed Structures: Fat Layer (Subcutaneous N/A N/A Tissue) Exposed: Yes Fascia: No Tendon: No Muscle: No Joint: No Bone: No Epithelialization: Small (1-33%) N/A N/A Periwound Skin Texture: Excoriation: No N/A N/A Induration: No Callus: No Crepitus: No Rash: No Scarring: No Periwound Skin Maceration: No N/A N/A Moisture: Dry/Scaly: No Periwound Skin Color: Ecchymosis: Yes N/A N/A Atrophie Blanche: No Cyanosis: No Erythema: No Hemosiderin Staining: No Mottled: No Pallor: No Rubor: No Temperature: No Abnormality N/A N/A Tenderness on Yes N/A N/A Palpation: Wound Preparation: Ulcer Cleansing: N/A N/A Rinsed/Irrigated  with Saline Topical Anesthetic Applied: Other: lidocaine 4% Treatment Notes Electronic Signature(s) Signed: 01/22/2017 5:15:21 PM By: Baltazar Najjar MD Entered By: Baltazar Najjar on 01/22/2017 17:00:51 [name], [name] ([id number]) -------------------------------------------------------------------------------- Multi-Disciplinary Care Plan Details Patient Name: [name] Date of Service: 01/22/2017 3:30 PM Medical Record Number: [id number] Patient Account Number: [id number] Date of Birth/Sex: May 09, 1941 (76 y.o. Female) Treating RN: Curtis Sites Primary Care Merlinda Wrubel: Gabriel Cirri Other Clinician: Referring Floye Fesler: Gabriel Cirri Treating Billiejo Sorto/Extender: Altamese [place] in Treatment: 5 Active Inactive ` Abuse / Safety / Falls / Self Care Management Nursing Diagnoses: Potential for falls Goals: Patient will not experience any injury related to falls Date Initiated: 12/18/2016 Target Resolution Date: 01/24/2017 Goal Status: Active Interventions: Assess fall risk on admission and as needed Notes: ` Orientation to the Wound Care Program Nursing Diagnoses: Knowledge deficit related to the wound healing center program Goals: Patient/caregiver will verbalize understanding of the Wound Healing Center Program Date Initiated: 12/18/2016 Target Resolution Date: 01/24/2017 Goal Status: Active Interventions: Provide education on orientation to the wound center Notes: ` Venous Leg Ulcer Nursing Diagnoses: Actual venous Insuffiency (use after diagnosis is confirmed) [name], [name] ([id number]) Goals: Patient will maintain optimal edema control Date Initiated: 12/18/2016 Target Resolution Date: 01/24/2017 Goal Status: Active Interventions: Assess peripheral edema status every visit. Notes: ` Wound/Skin Impairment Nursing Diagnoses: Impaired tissue integrity Goals: Ulcer/skin breakdown will have a volume reduction of 30% by week 4 Date Initiated: 12/18/2016 Target  Resolution Date: 01/24/2017 Goal Status: Active Ulcer/skin breakdown will have a volume reduction of 50% by week 8 Date Initiated: 12/18/2016 Target Resolution Date: 01/24/2017 Goal Status: Active Ulcer/skin breakdown will have a volume reduction of 80% by week 12 Date Initiated: 12/18/2016 Target Resolution Date: 01/24/2017 Goal Status: Active Ulcer/skin breakdown will heal within 14 weeks Date Initiated: 12/18/2016 Target Resolution Date: 01/24/2017 Goal Status: Active Interventions: Assess patient/caregiver ability to obtain necessary supplies Assess patient/caregiver ability to perform ulcer/skin care regimen upon admission and as needed Assess ulceration(s) every visit Notes: Electronic Signature(s) Signed: 01/22/2017 5:49:39 PM By: Curtis Sites Entered By: Curtis Sites on 01/22/2017 15:47:36 Finlayson, [name] ([id number]) -------------------------------------------------------------------------------- Pain Assessment Details Patient Name: [name] Date of Service: 01/22/2017 3:30 PM Medical Record Number: [id number] Patient Account Number:  [id number] Date of Birth/Sex: 12/06/1940 (76 y.o. Female) Treating RN: Curtis Sites Primary Care Grettell Ransdell: Gabriel Cirri Other Clinician: Referring Tisa Weisel: Gabriel Cirri Treating Natonya Finstad/Extender: Altamese [place] in Treatment: 5 Active Problems Location of Pain Severity and Description of Pain Patient Has Paino No Site Locations Pain Management and Medication Current Pain Management: Notes Topical or injectable lidocaine is offered to patient for acute pain when surgical debridement is performed. If needed, Patient is instructed to use over the counter pain medication for the following 24-48 hours after debridement. Wound care MDs do not prescribed pain medications. Patient has chronic pain or uncontrolled pain. Patient has been instructed to make an appointment with their Primary Care Physician for pain management. Electronic  Signature(s) Signed: 01/22/2017 5:49:39 PM By: Curtis Sites Entered By: Curtis Sites on 01/22/2017 15:36:54 [name], [name] ([id number]) -------------------------------------------------------------------------------- Patient/Caregiver Education Details Patient Name: [name] Date of Service: 01/22/2017 3:30 PM Medical Record Patient Account Number: [id number] Number: Treating RN: Curtis Sites 1940/08/22 (76 y.o. Other Clinician: Date of Birth/Gender: Female) Treating ROBSON, MICHAEL Primary Care Physician: Gabriel Cirri Physician/Extender: G Referring Physician: Alfredo Martinez in Treatment: 5 Education Assessment Education Provided To: Patient Education Topics Provided Venous: Handouts: Other: continue compression daily and call about pumps Methods: Explain/Verbal Responses: State content correctly Electronic Signature(s) Signed: 01/22/2017 5:49:39 PM By: Curtis Sites Entered By: Curtis Sites on 01/22/2017 17:02:15 [name], [name] ([id number]) -------------------------------------------------------------------------------- Wound Assessment Details Patient Name: [name] Date of Service: 01/22/2017 3:30 PM Medical Record Number: [id number] Patient Account Number: [id number] Date of Birth/Sex: Sep 23, 1940 (76 y.o. Female) Treating RN: Curtis Sites Primary Care Mykenzie Ebanks: Gabriel Cirri Other Clinician: Referring Jaquae Rieves: Gabriel Cirri Treating Mckinnley Smithey/Extender: Altamese [place] in Treatment: 5 Wound Status Wound Number: 3 Primary Venous Leg Ulcer Etiology: Wound Location: Left Lower Leg - Anterior Wound Status: Open Wounding Event: Trauma Comorbid Cataracts, Asthma, Arrhythmia, Date Acquired: 12/04/2016 History: Hypertension, Gout Weeks Of Treatment: 5 Clustered Wound: No Photos Photo Uploaded By: Elliot Gurney, BSN, RN, CWS, Kim on 01/22/2017 16:59:26 Wound Measurements Length: (cm) 0.3 Width: (cm) 2.3 Depth: (cm) 0.1 Area: (cm)  0.542 Volume: (cm) 0.054 % Reduction in Area: 81.8% % Reduction in Volume: 97% Epithelialization: Small (1-33%) Tunneling: No Undermining: No Wound Description Full Thickness With Exposed Classification: Support Structures Wound Margin: Flat and Intact Exudate Large Amount: Exudate Type: Serous Exudate Color: amber Foul Odor After Cleansing: No Slough/Fibrino Yes Wound Bed Granulation Amount: Medium (34-66%) Exposed Structure Granulation Quality: Red Fascia Exposed: No Necrotic Amount: Medium (34-66%) Fat Layer (Subcutaneous Tissue) Exposed: Yes Spearman, Odeal Judie Petit ([id number]) Necrotic Quality: Adherent Slough Tendon Exposed: No Muscle Exposed: No Joint Exposed: No Bone Exposed: No Periwound Skin Texture Texture Color No Abnormalities Noted: No No Abnormalities Noted: No Callus: No Atrophie Blanche: No Crepitus: No Cyanosis: No Excoriation: No Ecchymosis: Yes Induration: No Erythema: No Rash: No Hemosiderin Staining: No Scarring: No Mottled: No Pallor: No Moisture Rubor: No No Abnormalities Noted: No Dry / Scaly: No Temperature / Pain Maceration: No Temperature: No Abnormality Tenderness on Palpation: Yes Wound Preparation Ulcer Cleansing: Rinsed/Irrigated with Saline Topical Anesthetic Applied: Other: lidocaine 4%, Treatment Notes Wound #3 (Left, Anterior Lower Leg) 1. Cleansed with: Clean wound with Normal Saline 2. Anesthetic Topical Lidocaine 4% cream to wound bed prior to debridement 4. Dressing Applied: Aquacel Ag Other dressing (specify in notes) 5. Secondary Dressing Applied ABD Pad 7. Secured with Other (specify in notes) Notes mupiricin on wound, netting to secure Electronic Signature(s) Signed: 01/22/2017 5:49:39 PM By: Francesco Sor,  Mardene Celeste Entered By: Curtis Sites on 01/22/2017 15:42:53 [name], [name] ([id number]) -------------------------------------------------------------------------------- Vitals Details Patient Name: [name] Date of Service: 01/22/2017 3:30 PM Medical Record Number: [id number] Patient Account Number: [id number] Date of Birth/Sex: June 17, 1940 (76 y.o. Female) Treating RN: Curtis Sites Primary Care Anberlin Diez: Gabriel Cirri Other Clinician: Referring Chijioke Lasser: Gabriel Cirri Treating Beau Vanduzer/Extender: Altamese [place] in Treatment: 5 Vital Signs Time Taken: 15:36 Temperature (F): 98.4 Height (in): 63 Pulse (bpm): 73 Weight (lbs): 331 Respiratory Rate (breaths/min): 20 Body Mass Index (BMI): 58.6 Blood Pressure (mmHg): 150/86 Reference Range: 80 - 120 mg / dl Electronic Signature(s) Signed: 01/22/2017 5:49:39 PM By: Curtis Sites Entered By: Curtis Sites on 01/22/2017 15:38:14

## 2017-01-29 ENCOUNTER — Encounter: Payer: BC Managed Care – PPO | Admitting: Internal Medicine

## 2017-01-29 DIAGNOSIS — J45909 Unspecified asthma, uncomplicated: Secondary | ICD-10-CM | POA: Diagnosis not present

## 2017-01-29 DIAGNOSIS — I10 Essential (primary) hypertension: Secondary | ICD-10-CM | POA: Diagnosis not present

## 2017-01-29 DIAGNOSIS — I87322 Chronic venous hypertension (idiopathic) with inflammation of left lower extremity: Secondary | ICD-10-CM | POA: Diagnosis not present

## 2017-01-29 DIAGNOSIS — S81812D Laceration without foreign body, left lower leg, subsequent encounter: Secondary | ICD-10-CM | POA: Diagnosis not present

## 2017-01-29 DIAGNOSIS — M109 Gout, unspecified: Secondary | ICD-10-CM | POA: Diagnosis not present

## 2017-01-29 DIAGNOSIS — S81802A Unspecified open wound, left lower leg, initial encounter: Secondary | ICD-10-CM | POA: Diagnosis not present

## 2017-01-29 DIAGNOSIS — I890 Lymphedema, not elsewhere classified: Secondary | ICD-10-CM | POA: Diagnosis not present

## 2017-01-30 NOTE — Progress Notes (Signed)
ROSHA, COCKER ([id number]) Visit Report for 01/29/2017 HPI Details Patient Name: [name], [name]. Date of Service: 01/29/2017 3:15 PM Medical Record Patient Account Number: [id number] Number: Treating RN: Phillis Haggis 01-13-1941 (76 y.o. Other Clinician: Date of Birth/Sex: Female) Treating Shirley Bolle Primary Care Provider: Gabriel Cirri Provider/Extender: G Referring Provider: Alfredo Martinez in Treatment: 6 History of Present Illness Location: The patient has bilateral lower extremities swelling. she does have a small wound on the lower posterior aspect of her right lower extremity. Severity: the patient swelling and erythema has been moderate. Duration: she has had the lower extremity swelling for about 14 years now. She's noticed erythema in the last week or so. Context: marked lymphedema bilateral Modifying Factors: she has a medical grade compression stockings at home but she has never worn them because that she feels they're too tight. Associated Signs and Symptoms: she denies any fevers. HPI Description: The patient is a 76 year old female who presents with bilateral lower extremity swelling with right post calf ulcer and erythema. She has a long history of lower extremity swelling. She first noticed it in 2001. The patient returns for follow-up today. Her RLE erythema has subsided. 12/18/16; READMISSION this is a patient who is been in this clinic previously I think for a wound on the right lower extremity. She has a history of severe bilateral lower stamina and the lymphedema and also chronic venous insufficiency with inflammation [stasis dermatitis]. She wears 20-30 mm below-knee compression stockings and she is compliant with these. She also has external compression pumps at home but does not use these in fact she states that she doesn't think they actually work because of problems with the Velcro. 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 This immediately opened up and started bleeding. She was seen in the emergency room on 12/04/16 and she had 14 sutures placed. This was followed by primary care felt to have coexistent cellulitis. The sutures were removed on 11/21/16 and the wound was Steri-Stripped although the tissue has not adhered and she has an open wound currently. Lab work on 12/13/16 showed a white count of 5.7. At some point she was felt to have cellulitis and started on Biaxin she is allergic to penicillin, quinolones and sulfonamides. She has not been systemically unwell. She did not tolerate compression last time and tells me she had to come in every 3 days to have a rewrapped done. I've talked to her about this and said the best thing we can do is see her back once for a nurse visit. I have emphasized the need to get the swelling out of the left leg to give this wound the best chance to heal [name], [name]. ([id number]) we could not obtain ABIs in this clinic as the patient could not tolerate the compression 12/25/16 on evaluation today patient continues to have good toleration of the compression at this point. She is pleased with how things are progressing. Patient has no nausea, vomiting, or diarrhea and no fevers or chills. 01/01/17 on evaluation today patient appears to be doing fairly well in regard to her right lower extremity. At this time she seems to be having some issues with potential cellulitis of this right lower extremity unfortunately. She also has increased pain compared to the previous evaluation. That was one week ago. She wake her discomfort to be a 4-5 out of 10 that  is definitely worse with cleansing of the wound in particular. She has been tolerating the compression okay although at this point I'm not sure that we are gonna continue that due to the potential infection. We have been using a silver alginate dressing  up to this point. Patient wonders if there's anything else we should do going forward. No fevers, chills, nausea, or vomiting noted at this time. 01/08/17; leftlower extremity wound in the setting of very significant lymphedema. Culture last week grew Proteus. She is not on antibiotics. She is using Hydrofera Blue but instead of compression wraps is using her own compression stocking. She states she feels better and indeed the wound has continued to improve 01/15/17; left lower extremity wound in the setting of very significant lymphedema. This was initially a traumatic wound 9/I/18; left lower extremity anteriorly in the setting of very significant lymphedema. This was initially a traumatic wound. This is making good progress 01/29/17; the area on the left lateral lower leg has healed. The patient is in the process of getting juxta light stockings, she has 1 however [employer] did not supply the one for the non-involved leg even though the patient would pay privately. Electronic Signature(s) Signed: 01/29/2017 4:34:43 PM By: Baltazar Najjar MD Entered By: Baltazar Najjar on 01/29/2017 16:27:32 Taliaferro, [name] ([id number]) -------------------------------------------------------------------------------- Physical Exam Details Patient Name: [name] Date of Service: 01/29/2017 3:15 PM Medical Record Patient Account Number: [id number] Number: Treating RN: Phillis Haggis 1941-01-17 (76 y.o. Other Clinician: Date of Birth/Sex: Female) Treating Gabbi Whetstone Primary Care Provider: Gabriel Cirri Provider/Extender: G Referring Provider: Alfredo Martinez in Treatment: 6 Constitutional Patient is hypertensive.. Pulse regular and within target range for patient.Marland Kitchen Respirations regular, non-labored and within target range.. Temperature is normal and within the target range for the patient.Marland Kitchen appears in no distress. Eyes Conjunctivae clear. No discharge. Respiratory Respiratory effort  is easy and symmetric bilaterally. Rate is normal at rest and on room air.. Cardiovascular Pedal pulses palpable and strong bilaterally.. Bilateral lower extremity lymphedema. Lymphatic None palpable in the popliteal or inguinal area on the left. Notes Wound exam patient's wound is totally closed on the left anterior lateral leg. Considerable degree of lymphedema. She will need ongoing compression both with stockings times Electronic Signature(s) Signed: 01/29/2017 4:34:43 PM By: Baltazar Najjar MD Entered By: Baltazar Najjar on 01/29/2017 16:29:00 Valdes, [name] ([id number]) -------------------------------------------------------------------------------- Physician Orders Details Patient Name: [name] Date of Service: 01/29/2017 3:15 PM Medical Record Patient Account Number: [id number] Number: Treating RN: Phillis Haggis [birth year] (76 y.o. Other Clinician: Date of Birth/Sex: Female) Treating Rozlyn Yerby Primary Care Provider: Gabriel Cirri Provider/Extender: G Referring Provider: Alfredo Martinez in Treatment: 6 Verbal / Phone Orders: No Diagnosis Coding [facility]charge [facility] Services o Discharge from Wound Care Center - Wear your compression stockings everyday and take off at night. Use your Lymphedema pumps as prescribed. Please call our office if you have any questions or concerns. Electronic Signature(s) Signed: 01/29/2017 4:34:43 PM By: Baltazar Najjar MD Signed: 01/29/2017 4:54:17 PM By: Alejandro Mulling Entered By: Alejandro Mulling on 01/29/2017 16:05:48 Monceaux, [name] ([id number]) -------------------------------------------------------------------------------- Problem List Details Patient Name: [name] Date of Service: 01/29/2017 3:15 PM Medical Record Patient Account Number: [id number] Number: Treating RN: Phillis Haggis 06-22-1940 (76 y.o. Other Clinician: Date of Birth/Sex: Female) Treating Symphany Fleissner Primary Care Provider:  Gabriel Cirri Provider/Extender: G Referring Provider: Alfredo Martinez in Treatment: 6 Active Problems ICD-10 Encounter Code Description Active Date Diagnosis S81.812D Laceration without  foreign body, left lower leg, subsequent 12/18/2016 Yes encounter I89.0 Lymphedema, not elsewhere classified 12/18/2016 Yes I87.322 Chronic venous hypertension (idiopathic) with 12/18/2016 Yes inflammation of left lower extremity Inactive Problems Resolved Problems Electronic Signature(s) Signed: 01/29/2017 4:34:43 PM By: Baltazar Najjar MD Entered By: Baltazar Najjar on 01/29/2017 16:25:52 Xu, [name] ([id number]) -------------------------------------------------------------------------------- Progress Note Details Patient Name: [name] Date of Service: 01/29/2017 3:15 PM Medical Record Patient Account Number: [id number] Number: Treating RN: Phillis Haggis 1941/02/23 (76 y.o. Other Clinician: Date of Birth/Sex: Female) Treating Bethlehem Langstaff Primary Care Provider: Gabriel Cirri Provider/Extender: G Referring Provider: Alfredo Martinez in Treatment: 6 Subjective History of Present Illness (HPI) The following HPI elements were documented for the patient's wound: Location: The patient has bilateral lower extremities swelling. she does have a small wound on the lower posterior aspect of her right lower extremity. Severity: the patient swelling and erythema has been moderate. Duration: she has had the lower extremity swelling for about 14 years now. She's noticed erythema in the last week or so. Context: marked lymphedema bilateral Modifying Factors: she has a medical grade compression stockings at home but she has never worn them because that she feels they're too tight. Associated Signs and Symptoms: she denies any fevers. The patient is a 76 year old female who presents with bilateral lower extremity swelling with right post calf ulcer and erythema. She has a long  history of lower extremity swelling. She first noticed it in 2001. The patient returns for follow-up today. Her RLE erythema has subsided. 12/18/16; READMISSION this is a patient who is been in this clinic previously I think for a wound on the right lower extremity. She has a history of severe bilateral lower stamina and the lymphedema and also chronic venous insufficiency with inflammation [stasis dermatitis]. She wears 20-30 mm below-knee compression stockings and she is compliant with these. She also has external compression pumps at home but does not use these in fact she states that she doesn't think they actually work because of problems with the Velcro. 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 This immediately opened up and started bleeding. She was seen in the emergency room on 12/04/16 and she had 14 sutures placed. This was followed by primary care felt to have coexistent cellulitis. The sutures were removed on 11/21/16 and the wound was Steri-Stripped although the tissue has not adhered and she has an open wound currently. Lab work on 12/13/16 showed a white count of 5.7. At some point she was felt to have cellulitis and started on Biaxin she is allergic to penicillin, quinolones and sulfonamides. She has not been systemically unwell. She did not tolerate compression last time and tells me she had to come in every 3 days to have a rewrapped done. I've talked to her about this and said the best thing we can do is see her back once for a nurse visit. I have emphasized the need to get the swelling out of the left leg to give this wound the best chance to heal [name], [name]. ([id number]) we could not obtain ABIs in this clinic as the patient could not tolerate the compression 12/25/16 on evaluation today patient continues to have good toleration of the compression at this point. She is pleased  with how things are progressing. Patient has no nausea, vomiting, or diarrhea and no fevers or chills. 01/01/17 on evaluation today patient  appears to be doing fairly well in regard to her right lower extremity. At this time she seems to be having some issues with potential cellulitis of this right lower extremity unfortunately. She also has increased pain compared to the previous evaluation. That was one week ago. She wake her discomfort to be a 4-5 out of 10 that is definitely worse with cleansing of the wound in particular. She has been tolerating the compression okay although at this point I'm not sure that we are gonna continue that due to the potential infection. We have been using a silver alginate dressing up to this point. Patient wonders if there's anything else we should do going forward. No fevers, chills, nausea, or vomiting noted at this time. 01/08/17; leftlower extremity wound in the setting of very significant lymphedema. Culture last week grew Proteus. She is not on antibiotics. She is using Hydrofera Blue but instead of compression wraps is using her own compression stocking. She states she feels better and indeed the wound has continued to improve 01/15/17; left lower extremity wound in the setting of very significant lymphedema. This was initially a traumatic wound 9/I/18; left lower extremity anteriorly in the setting of very significant lymphedema. This was initially a traumatic wound. This is making good progress 01/29/17; the area on the left lateral lower leg has healed. The patient is in the process of getting juxta light stockings, she has 1 however [employer] did not supply the one for the non-involved leg even though the patient would pay privately. Objective Constitutional Patient is hypertensive.. Pulse regular and within target range for patient.Marland Kitchen Respirations regular, non-labored and within target range.. Temperature is normal and within the target range for the  patient.Marland Kitchen appears in no distress. Vitals Time Taken: 3:51 PM, Height: 63 in, Weight: 331 lbs, BMI: 58.6, Temperature: 98.1 F, Pulse: 59 bpm, Respiratory Rate: 20 breaths/min, Blood Pressure: 153/82 mmHg. Eyes Conjunctivae clear. No discharge. Respiratory Respiratory effort is easy and symmetric bilaterally. Rate is normal at rest and on room air.[name] ([id number]) Cardiovascular Pedal pulses palpable and strong bilaterally.. Bilateral lower extremity lymphedema. Lymphatic None palpable in the popliteal or inguinal area on the left. General Notes: Wound exam patient's wound is totally closed on the left anterior lateral leg. Considerable degree of lymphedema. She will need ongoing compression both with stockings times Integumentary (Hair, Skin) Wound #3 status is Open. Original cause of wound was Trauma. The wound is located on the Left,Anterior Lower Leg. The wound measures 0cm length x 0cm width x 0cm depth; 0cm^2 area and 0cm^3 volume. There is Fat Layer (Subcutaneous Tissue) Exposed exposed. There is no tunneling or undermining noted. There is a none present amount of drainage noted. The wound margin is flat and intact. There is no granulation within the wound bed. There is no necrotic tissue within the wound bed. The periwound skin appearance exhibited: Ecchymosis. The periwound skin appearance did not exhibit: Callus, Crepitus, Excoriation, Induration, Rash, Scarring, Dry/Scaly, Maceration, Atrophie Blanche, Cyanosis, Hemosiderin Staining, Mottled, Pallor, Rubor, Erythema. Periwound temperature was noted as No Abnormality. Assessment Active Problems ICD-10 S81.812D - Laceration without foreign body, left lower leg, subsequent encounter I89.0 - Lymphedema, not elsewhere classified I87.322 - Chronic venous hypertension (idiopathic) with inflammation of left lower extremity Plan Discharge From [facility] Services: Discharge from Wound Care Center - Wear your compression  stockings everyday and take off at night. Use your Lymphedema pumps as prescribed. Please call our office if you have any questions or concerns. #1 the patient  to be discharged from the wound care center [name], [name] ([id number]) #2. Going to get her a right leg juxta light stocking that the patient will pay for privately #3 the patient has her compression pumps which are apparently not working. We gave her the number of the company that supplied them to see if they could be repaired, she will call them. Electronic Signature(s) Signed: 01/29/2017 4:34:43 PM By: Baltazar Najjarobson, Danna Sewell MD Entered By: Baltazar Najjarobson, Kalinda Romaniello on 01/29/2017 16:29:43 Galanti, Barbara SearingBETTY M. ([id number]) -------------------------------------------------------------------------------- SuperBill Details Patient Name: [name], [name] Date of Service: 01/29/2017 Medical Record Patient Account Number: [id number] Number: Treating RN: Phillis Haggisinkerton, Debi 04/18/1941 (76 y.o. Other Clinician: Date of Birth/Sex: Female) Treating Corry Ihnen Primary Care Provider: Gabriel CirriWICKER, [name] Provider/Extender: G Referring Provider: Alfredo MartinezWICKER, [name] Weeks in Treatment: 6 Diagnosis Coding ICD-10 Codes Code Description S81.812D Laceration without foreign body, left lower leg, subsequent encounter I89.0 Lymphedema, not elsewhere classified I87.322 Chronic venous hypertension (idiopathic) with inflammation of left lower extremity Facility Procedures CPT4 Code: [id number] Description: [id number] - WOUND CARE VISIT-LEV 2 EST PT Modifier: Quantity: 1 Physician Procedures CPT4 Code Description: [id number] 99213 - WC PHYS LEVEL 3 - EST PT ICD-10 Description Diagnosis S81.812D Laceration without foreign body, left lower leg, s I89.0 Lymphedema, not elsewhere classified Modifier: ubsequent encou Quantity: 1 nter Electronic Signature(s) Signed: 01/29/2017 4:34:43 PM By: Baltazar Najjarobson, Sherelle Castelli MD Signed: 01/29/2017 4:54:17 PM By: Alejandro MullingPinkerton, Debra Entered By: Alejandro MullingPinkerton, Debra  on 01/29/2017 16:30:20
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
## 2017-02-05 ENCOUNTER — Ambulatory Visit: Payer: Self-pay | Admitting: Internal Medicine

## 2017-02-12 ENCOUNTER — Encounter: Payer: Self-pay | Admitting: Unknown Physician Specialty

## 2017-02-12 ENCOUNTER — Ambulatory Visit (INDEPENDENT_AMBULATORY_CARE_PROVIDER_SITE_OTHER): Payer: BC Managed Care – PPO | Admitting: Unknown Physician Specialty

## 2017-02-12 VITALS — BP 116/80 | HR 77 | Temp 98.3°F | Wt 351.6 lb

## 2017-02-12 DIAGNOSIS — I10 Essential (primary) hypertension: Secondary | ICD-10-CM | POA: Diagnosis not present

## 2017-02-12 DIAGNOSIS — E785 Hyperlipidemia, unspecified: Secondary | ICD-10-CM | POA: Diagnosis not present

## 2017-02-12 DIAGNOSIS — F3340 Major depressive disorder, recurrent, in remission, unspecified: Secondary | ICD-10-CM | POA: Diagnosis not present

## 2017-02-12 DIAGNOSIS — Z23 Encounter for immunization: Secondary | ICD-10-CM

## 2017-02-12 LAB — LIPID PANEL PICCOLO, WAIVED
Cholesterol Piccolo, Waived: 213 mg/dL — ABNORMAL HIGH (ref <200)
HDL Chol Piccolo, Waived: >100 mg/dL (ref >59)
TRIGLYCERIDES PICCOLO,WAIVED: 115 mg/dL (ref <150)
VLDL Chol Calc Piccolo,Waive: 23 mg/dL (ref <30)

## 2017-02-12 NOTE — Assessment & Plan Note (Signed)
Check cholesterol today 

## 2017-02-12 NOTE — Addendum Note (Signed)
Addended by: Gabriel Cirri on: 02/12/2017 10:22 AM   Modules accepted: Orders

## 2017-02-12 NOTE — Assessment & Plan Note (Signed)
Stable, continue present medications.   

## 2017-02-12 NOTE — Patient Instructions (Signed)

## 2017-02-12 NOTE — Progress Notes (Signed)
BP 116/80   Pulse 77   Temp 98.3 F (36.8 C)   Wt (!) 351 lb 9.6 oz (159.5 kg)   LMP  (LMP Unknown)   SpO2 98%   BMI 60.35 kg/m    Subjective:    Patient ID: [name], female    DOB: Sep 11, 1940, 76 y.o.   MRN: [id number]  HPI: [name] is a 76 y.o. female  No chief complaint on file.  Hypertension Using medications without difficulty Average home BPs Not checking   No problems or lightheadedness No chest pain with exertion or shortness of breath No Edema  Hyperlipidemia Using medications without problems: No Muscle aches  Diet compliance:Exercise: Regular   Depression Stable on present medications Depression screen [facility] 2/9 02/12/2017 11/08/2016 07/10/2016 02/21/2016 11/06/2015  Decreased Interest 0 0 0 0 2  Down, Depressed, Hopeless 0 0 0 1 1  PHQ - 2 Score 0 0 0 1 3  Altered sleeping 0 - 0 - 2  Tired, decreased energy 0 - 0 - 1  Change in appetite 0 - 0 - 0  Feeling bad or failure about yourself  0 - 0 - 0  Trouble concentrating 0 - 0 - 0  Moving slowly or fidgety/restless 0 - 0 - 0  Suicidal thoughts 0 - 0 - 0  PHQ-9 Score 0 - 0 - 6    Relevant past medical, surgical, family and social history reviewed and updated as indicated. Interim medical history since our last visit reviewed. Allergies and medications reviewed and updated.  Review of Systems  Per HPI unless specifically indicated above     Objective:    BP 116/80   Pulse 77   Temp 98.3 F (36.8 C)   Wt (!) 351 lb 9.6 oz (159.5 kg)   LMP  (LMP Unknown)   SpO2 98%   BMI 60.35 kg/m   Wt Readings from Last 3 Encounters:  02/12/17 (!) 351 lb 9.6 oz (159.5 kg)  12/13/16 (!) 339 lb 6.4 oz (154 kg)  12/11/16 (!) 343 lb (155.6 kg)    Physical Exam  Constitutional: She is oriented to person, place, and time. She appears well-developed and well-nourished. No distress.  HENT:  Head: Normocephalic and atraumatic.  Eyes: Conjunctivae and lids are normal. Right eye exhibits no discharge. Left eye  exhibits no discharge. No scleral icterus.  Neck: Normal range of motion. Neck supple. No JVD present. Carotid bruit is not present.  Cardiovascular: Normal rate, regular rhythm and normal heart sounds.   Pulmonary/Chest: Effort normal and breath sounds normal.  Abdominal: Normal appearance. There is no splenomegaly or hepatomegaly.  Musculoskeletal: Normal range of motion.  Neurological: She is alert and oriented to person, place, and time.  Skin: Skin is warm, dry and intact. No rash noted. No pallor.  Psychiatric: She has a normal mood and affect. Her behavior is normal. Judgment and thought content normal.     Assessment & Plan:   Problem List Items Addressed This Visit      Unprioritized   Depression    Stable, continue present medications.       Essential hypertension, benign    Stable, continue present medications.        Relevant Orders   Comprehensive metabolic panel   Hyperlipidemia    Check cholesterol today.        Relevant Orders   Comprehensive metabolic panel   Lipid Panel [place]    Other Visit Diagnoses    Need  for influenza vaccination    -  Primary   Relevant Orders   Flu vaccine HIGH DOSE PF (Completed)       Follow up plan: Return in about 6 months (around 08/12/2017).

## 2017-02-13 ENCOUNTER — Encounter: Payer: Self-pay | Admitting: Unknown Physician Specialty

## 2017-02-13 LAB — LIPID PANEL W/O CHOL/HDL RATIO
Cholesterol, Total: 215 mg/dL — ABNORMAL HIGH (ref 100–199)
HDL: 102 mg/dL (ref >39)
LDL Calculated: 92 mg/dL (ref 0–99)
Triglycerides: 103 mg/dL (ref 0–149)
VLDL CHOLESTEROL CAL: 21 mg/dL (ref 5–40)

## 2017-02-13 LAB — COMPREHENSIVE METABOLIC PANEL
A/G RATIO: 2 (ref 1.2–2.2)
ALBUMIN: 4.5 g/dL (ref 3.5–4.8)
ALT: 10 IU/L (ref 0–32)
AST: 14 IU/L (ref 0–40)
Alkaline Phosphatase: 92 IU/L (ref 39–117)
BILIRUBIN TOTAL: 0.4 mg/dL (ref 0.0–1.2)
BUN / CREAT RATIO: 17 (ref 12–28)
BUN: 14 mg/dL (ref 8–27)
CHLORIDE: 103 mmol/L (ref 96–106)
CO2: 23 mmol/L (ref 20–29)
Calcium: 10.1 mg/dL (ref 8.7–10.3)
Creatinine, Ser: 0.82 mg/dL (ref 0.57–1.00)
GFR, EST AFRICAN AMERICAN: 80 mL/min/{1.73_m2} (ref >59)
GFR, EST NON AFRICAN AMERICAN: 70 mL/min/{1.73_m2} (ref >59)
GLUCOSE: 85 mg/dL (ref 65–99)
Globulin, Total: 2.3 g/dL (ref 1.5–4.5)
Potassium: 4.8 mmol/L (ref 3.5–5.2)
Sodium: 140 mmol/L (ref 134–144)
TOTAL PROTEIN: 6.8 g/dL (ref 6.0–8.5)

## 2017-03-14 ENCOUNTER — Ambulatory Visit (INDEPENDENT_AMBULATORY_CARE_PROVIDER_SITE_OTHER): Payer: BC Managed Care – PPO | Admitting: Unknown Physician Specialty

## 2017-03-14 ENCOUNTER — Encounter: Payer: Self-pay | Admitting: Unknown Physician Specialty

## 2017-03-14 VITALS — BP 133/87 | HR 77 | Temp 97.7°F | Wt 351.0 lb

## 2017-03-14 DIAGNOSIS — J4540 Moderate persistent asthma, uncomplicated: Secondary | ICD-10-CM | POA: Diagnosis not present

## 2017-03-14 DIAGNOSIS — J4541 Moderate persistent asthma with (acute) exacerbation: Secondary | ICD-10-CM | POA: Diagnosis not present

## 2017-03-14 MED ORDER — ALBUTEROL SULFATE HFA 108 (90 BASE) MCG/ACT IN AERS: 2.0000 | INHALATION_SPRAY | Freq: Four times a day (QID) | RESPIRATORY_TRACT | 0 refills | Status: DC | PRN

## 2017-03-14 MED ORDER — FLUTICASONE-SALMETEROL 250-50 MCG/DOSE IN AEPB: 1.0000 | INHALATION_SPRAY | Freq: Two times a day (BID) | RESPIRATORY_TRACT | 2 refills | Status: DC

## 2017-03-14 MED ORDER — ATORVASTATIN CALCIUM 10 MG PO TABS: 10.0000 mg | ORAL_TABLET | Freq: Every day | ORAL | 11 refills | Status: DC

## 2017-03-14 NOTE — Progress Notes (Signed)
BP 133/87   Pulse 77   Temp 97.7 F (36.5 C)   Wt (!) 351 lb (159.2 kg)   LMP  (LMP Unknown)   SpO2 99%   BMI 60.25 kg/m    Subjective:    Patient ID: [name], female    DOB: January 15, 1941, 76 y.o.   MRN: [id number]  HPI: [name] is a 76 y.o. female  Chief Complaint  Patient presents with  . Cough    pt states she has had productive cough for the last 4 weeks since getting the flu shot, states somebody at her work was diagnosed with bacterial pneumonia recently as well    Cough  This is a new problem. Episode onset: 4 weeks. The problem has been unchanged. The problem occurs constantly. The cough is non-productive. Associated symptoms include shortness of breath. Pertinent negatives include no chills, ear congestion, fever, nasal congestion, rash or rhinorrhea. Nothing aggravates the symptoms. Treatments tried: Delsum. The treatment provided significant relief. Her past medical history is significant for asthma.   Asthma She is not taking her Advair and taking just the occasional rescue inhaler  Relevant past medical, surgical, family and social history reviewed and updated as indicated. Interim medical history since our last visit reviewed. Allergies and medications reviewed and updated.  Review of Systems  Constitutional: Negative for chills and fever.  HENT: Negative for rhinorrhea.   Respiratory: Positive for cough and shortness of breath.   Skin: Negative for rash.    Per HPI unless specifically indicated above     Objective:    BP 133/87   Pulse 77   Temp 97.7 F (36.5 C)   Wt (!) 351 lb (159.2 kg)   LMP  (LMP Unknown)   SpO2 99%   BMI 60.25 kg/m   Wt Readings from Last 3 Encounters:  03/14/17 (!) 351 lb (159.2 kg)  02/12/17 (!) 351 lb 9.6 oz (159.5 kg)  12/13/16 (!) 339 lb 6.4 oz (154 kg)    Physical Exam  Constitutional: She is oriented to person, place, and time. She appears well-developed and well-nourished. No distress.  HENT:  Head:  Normocephalic and atraumatic.  Eyes: Conjunctivae and lids are normal. Right eye exhibits no discharge. Left eye exhibits no discharge. No scleral icterus.  Neck: Normal range of motion. Neck supple. No JVD present. Carotid bruit is not present.  Cardiovascular: Normal rate, regular rhythm and normal heart sounds.   Pulmonary/Chest: Effort normal. She has decreased breath sounds.  Abdominal: Normal appearance. There is no splenomegaly or hepatomegaly.  Musculoskeletal: Normal range of motion.  Neurological: She is alert and oriented to person, place, and time.  Skin: Skin is warm, dry and intact. No rash noted. No pallor.  Psychiatric: She has a normal mood and affect. Her behavior is normal. Judgment and thought content normal.     Assessment & Plan:   Problem List Items Addressed This Visit      Unprioritized   Asthma    Discussed adequate asthma management.  Nephew died taking too much Advair and she is nervous about it.  Reiterated safe use and may go off Advair if doing well after 1 month      Relevant Medications   albuterol (PROVENTIL HFA;VENTOLIN HFA) 108 (90 Base) MCG/ACT inhaler   Fluticasone-Salmeterol (ADVAIR DISKUS) 250-50 MCG/DOSE AEPB    Other Visit Diagnoses    Moderate persistent asthma with exacerbation    -  Primary   Asthma exacerbation.  Encouraged to take  maintenance inhaler and Albuterol prn.     Relevant Medications   albuterol (PROVENTIL HFA;VENTOLIN HFA) 108 (90 Base) MCG/ACT inhaler   Fluticasone-Salmeterol (ADVAIR DISKUS) 250-50 MCG/DOSE AEPB       Follow up plan: Return if symptoms worsen or fail to improve.

## 2017-03-14 NOTE — Assessment & Plan Note (Signed)
Discussed adequate asthma management.  Nephew died taking too much Advair and she is nervous about it.  Reiterated safe use and may go off Advair if doing well after 1 month

## 2017-04-01 ENCOUNTER — Telehealth: Payer: Self-pay | Admitting: Unknown Physician Specialty

## 2017-04-01 DIAGNOSIS — R05 Cough: Secondary | ICD-10-CM

## 2017-04-01 DIAGNOSIS — R059 Cough, unspecified: Secondary | ICD-10-CM

## 2017-04-01 MED ORDER — GUAIFENESIN-CODEINE 100-10 MG/5ML PO SOLN: 10.0000 mL | Freq: Three times a day (TID) | ORAL | 0 refills | Status: DC | PRN

## 2017-04-01 NOTE — Telephone Encounter (Signed)
Routing to provider. Patient seen 03/14/17.
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
## 2017-04-01 NOTE — Telephone Encounter (Signed)
Yes we can.  I would also like to get a chest x-Dohn.  Orders are in the chart

## 2017-04-01 NOTE — Telephone Encounter (Signed)
Copied from CRM [phone]. Topic: Quick Communication - See Telephone Encounter >> Apr 01, 2017  9:33 AM Clack, Princella PellegriniJessica D wrote: CRM for notification. See Telephone encounter for: Pt states that she is not feeling any better and wanted to know if you can call her in a cough syrup. 04/01/17.  [facility] [phone] - [place] - [address] AT [employer] [address] [phone] (Phone) [phone] (Fax)

## 2017-04-02 ENCOUNTER — Ambulatory Visit
Admission: RE | Admit: 2017-04-02 | Discharge: 2017-04-02 | Disposition: A | Discharge status: Home / Self Care | Payer: BC Managed Care – PPO | Source: Ambulatory Visit | Attending: Unknown Physician Specialty | Admitting: Unknown Physician Specialty

## 2017-04-02 DIAGNOSIS — I517 Cardiomegaly: Secondary | ICD-10-CM | POA: Insufficient documentation

## 2017-04-02 DIAGNOSIS — R059 Cough, unspecified: Secondary | ICD-10-CM

## 2017-04-02 DIAGNOSIS — R918 Other nonspecific abnormal finding of lung field: Secondary | ICD-10-CM | POA: Diagnosis not present

## 2017-04-02 DIAGNOSIS — R05 Cough: Secondary | ICD-10-CM | POA: Diagnosis not present

## 2017-04-03 ENCOUNTER — Telehealth: Payer: Self-pay | Admitting: Unknown Physician Specialty

## 2017-04-03 NOTE — Telephone Encounter (Signed)
Copied from CRM [phone]. Topic: Quick Communication - See Telephone Encounter >> Apr 03, 2017  2:35 PM Terisa Starraylor, Brittany L wrote: CRM for notification. See Telephone encounter for:   Patient is calling about her xray results that she has on 11/14. She would like a call back about the results 04/03/17.

## 2017-04-03 NOTE — Telephone Encounter (Signed)
Result was today.  Barbara MaxwellCheryl is out on Thursdays. Routing to provider to advise on result.

## 2017-04-04 ENCOUNTER — Ambulatory Visit: Payer: Self-pay

## 2017-04-04 ENCOUNTER — Other Ambulatory Visit: Payer: Self-pay | Admitting: Unknown Physician Specialty

## 2017-04-04 ENCOUNTER — Ambulatory Visit (INDEPENDENT_AMBULATORY_CARE_PROVIDER_SITE_OTHER): Payer: BC Managed Care – PPO | Admitting: Unknown Physician Specialty

## 2017-04-04 ENCOUNTER — Encounter: Payer: Self-pay | Admitting: Unknown Physician Specialty

## 2017-04-04 VITALS — BP 158/73 | HR 75 | Temp 98.6°F | Wt 350.8 lb

## 2017-04-04 DIAGNOSIS — J4551 Severe persistent asthma with (acute) exacerbation: Secondary | ICD-10-CM

## 2017-04-04 DIAGNOSIS — R0602 Shortness of breath: Secondary | ICD-10-CM | POA: Diagnosis not present

## 2017-04-04 DIAGNOSIS — R0902 Hypoxemia: Secondary | ICD-10-CM | POA: Diagnosis not present

## 2017-04-04 DIAGNOSIS — I517 Cardiomegaly: Secondary | ICD-10-CM | POA: Diagnosis not present

## 2017-04-04 MED ORDER — ALBUTEROL SULFATE (5 MG/ML) 0.5% IN NEBU: 2.5000 mg | INHALATION_SOLUTION | Freq: Four times a day (QID) | RESPIRATORY_TRACT | 12 refills | Status: DC | PRN

## 2017-04-04 MED ORDER — HYDROCOD POLST-CPM POLST ER 10-8 MG/5ML PO SUER: 5.0000 mL | Freq: Two times a day (BID) | ORAL | 0 refills | Status: DC | PRN

## 2017-04-04 MED ORDER — CLARITHROMYCIN 500 MG PO TABS: 500.0000 mg | ORAL_TABLET | Freq: Two times a day (BID) | ORAL | 0 refills | Status: DC

## 2017-04-04 NOTE — Telephone Encounter (Signed)
Result note sent to my in basket this morning by provider. Please see that note for further documentation. Patient was called and VM was left about results.

## 2017-04-04 NOTE — Telephone Encounter (Signed)
Copied from CRM [id number]. Topic: Quick Communication - See Telephone Encounter >> Apr 04, 2017  8:21 AM [name], [name] wrote: CRM for notification. See Telephone encounter for: 04/04/17. Patient needs another round of antibiotic or cough syrup Guaifenesin-Codeine 100-10 MG/5ML sent to her pharmacy [phone] - [place], [place] - [address] [facility] [address] .

## 2017-04-04 NOTE — Telephone Encounter (Signed)
Called pt Left message Pt requesting antibiotic and cough syrup

## 2017-04-04 NOTE — Telephone Encounter (Signed)
Pt requesting ABX or cough syrup. Pt sick since Sept "after getting Flu shot" Has had OV for this. Pt is congested head chest, wheezing, productive yellow cough. Called Christan (flow coordinator) @ [facility] and informed her of situation. Agrees that pt needs  to be seen. Pt was hesitant on going to the PCP initially but stated she will go to a 1545 appt with [name] NP today.  Reason for Disposition . Wheezing is present  Answer Assessment - Initial Assessment Questions 1. ONSET: "When did the cough begin?"      02/13/17 2. SEVERITY: "How bad is the cough today?"      Cough has gotten worse having coughing spasms  3. RESPIRATORY DISTRESS: "Describe your breathing."      Wheezing was put on 2 inhalers still wheezing  4. FEVER: "Do you have a fever?" If so, ask: "What is your temperature, how was it measured, and when did it start?"     no 5. SPUTUM: "Describe the color of your sputum" (clear, white, yellow, green)     yellow 6. HEMOPTYSIS: "Are you coughing up any blood?" If so ask: "How much?" (flecks, streaks, tablespoons, etc.)     no 7. CARDIAC HISTORY: "Do you have any history of heart disease?" (e.g., heart attack, congestive heart failure)     HTN 8. LUNG HISTORY: "Do you have any history of lung disease?"  (e.g., pulmonary embolus, asthma, emphysema)     Xray done 2 days ago  9. PE RISK FACTORS: "Do you have a history of blood clots?" (or: recent major surgery, recent prolonged travel, bedridden ) no 10. OTHER SYMPTOMS: "Do you have any other symptoms?" (e.g., runny nose, wheezing, chest pain)       Runny nose, wheezing and back is sore "from coughing" 11. PREGNANCY: "Is there any chance you are pregnant?" "When was your last menstrual period?"       n/a 12. TRAVEL: "Have you traveled out of the country in the last month?" (e.g., travel history, exposures)       n/a  Protocols used: COUGH - ACUTE PRODUCTIVE-A-AH

## 2017-04-04 NOTE — Addendum Note (Signed)
Addended by: Gabriel CirriWICKER, Getsemani Lindon on: 04/04/2017 04:37 PM   Modules accepted: Orders

## 2017-04-04 NOTE — Telephone Encounter (Signed)
Pt called back. Appt made for 1545 today

## 2017-04-04 NOTE — Progress Notes (Signed)
BP (!) 158/73 (BP Location: Right Arm, Cuff Size: Large)   Pulse 75   Temp 98.6 F (37 C) (Oral)   Wt (!) 350 lb 12.8 oz (159.1 kg)   LMP  (LMP Unknown)   SpO2 93%   BMI 60.21 kg/m    Subjective:    Patient ID: [name], female    DOB: 04/14/1941, 76 y.o.   MRN: [id number]  HPI: [name] is a 76 y.o. female  Chief Complaint  Patient presents with  . URI    pt states she has gotten worse since last visit, states she now has yellow phlegm and feels that she needs an antibiotic    Cough  Chronicity: Progressive cough since 10/26. X-Martone shows scarring or atelectasis riht middle lobe. The problem has been gradually worsening. The problem occurs constantly. The cough is productive of brown sputum. Associated symptoms include chills, a fever, headaches, myalgias, shortness of breath and wheezing. Pertinent negatives include no heartburn, nasal congestion, rash, rhinorrhea or sweats. She has tried a beta-agonist inhaler (wants a nebulizer.  ) for the symptoms. The treatment provided no relief. Her past medical history is significant for asthma.   Relevant past medical, surgical, family and social history reviewed and updated as indicated. Interim medical history since our last visit reviewed. Allergies and medications reviewed and updated.  Review of Systems  Constitutional: Positive for chills and fever.  HENT: Negative for rhinorrhea.   Respiratory: Positive for cough, shortness of breath and wheezing.   Gastrointestinal: Negative for heartburn.  Musculoskeletal: Positive for myalgias.  Skin: Negative for rash.  Neurological: Positive for headaches.    Per HPI unless specifically indicated above     Objective:    BP (!) 158/73 (BP Location: Right Arm, Cuff Size: Large)   Pulse 75   Temp 98.6 F (37 C) (Oral)   Wt (!) 350 lb 12.8 oz (159.1 kg)   LMP  (LMP Unknown)   SpO2 93%   BMI 60.21 kg/m   Wt Readings from Last 3 Encounters:  04/04/17 (!) 350 lb 12.8 oz (159.1  kg)  03/14/17 (!) 351 lb (159.2 kg)  02/12/17 (!) 351 lb 9.6 oz (159.5 kg)    Physical Exam  Constitutional: She is oriented to person, place, and time. She appears well-developed and well-nourished. No distress.  HENT:  Head: Normocephalic and atraumatic.  Eyes: Conjunctivae and lids are normal. Right eye exhibits no discharge. Left eye exhibits no discharge. No scleral icterus.  Neck: Normal range of motion. Neck supple. No JVD present. Carotid bruit is not present.  Cardiovascular: Normal rate, regular rhythm and normal heart sounds.  Pulmonary/Chest: Effort normal. She has rhonchi in the right upper field, the right middle field and the right lower field. She has rales in the right upper field, the right middle field and the right lower field.  Abdominal: Normal appearance. There is no splenomegaly or hepatomegaly.  Musculoskeletal: Normal range of motion.  Neurological: She is alert and oriented to person, place, and time.  Skin: Skin is warm, dry and intact. No rash noted. No pallor.  Psychiatric: She has a normal mood and affect. Her behavior is normal. Judgment and thought content normal.   Chest x-Dickens reviewed normal except for cardiomegaly and atelectasis vs scarring mid left.  After nebulizer Pulse ox improved to 98% and BP down to SBP of 157   Results for orders placed or performed in visit on 02/12/17  Comprehensive metabolic panel  Result Value Ref  Range   Glucose 85 65 - 99 mg/dL   BUN 14 8 - 27 mg/dL   Creatinine, Ser 1.020.82 0.57 - 1.00 mg/dL   GFR calc non Af Amer 70 >59 mL/min/1.73   GFR calc Af Amer 80 >59 mL/min/1.73   BUN/Creatinine Ratio 17 12 - 28   Sodium 140 134 - 144 mmol/L   Potassium 4.8 3.5 - 5.2 mmol/L   Chloride 103 96 - 106 mmol/L   CO2 23 20 - 29 mmol/L   Calcium 10.1 8.7 - 10.3 mg/dL   Total Protein 6.8 6.0 - 8.5 g/dL   Albumin 4.5 3.5 - 4.8 g/dL   Globulin, Total 2.3 1.5 - 4.5 g/dL   Albumin/Globulin Ratio 2.0 1.2 - 2.2   Bilirubin Total 0.4 0.0  - 1.2 mg/dL   Alkaline Phosphatase 92 39 - 117 IU/L   AST 14 0 - 40 IU/L   ALT 10 0 - 32 IU/L  Lipid Panel Piccolo, Waived  Result Value Ref Range   Cholesterol Piccolo, Waived 213 (H) <200 mg/dL   HDL Chol Piccolo, Waived >100 >59 mg/dL   Triglycerides Piccolo,Waived 115 <150 mg/dL   VLDL Chol Calc Piccolo,Waive 23 <30 mg/dL  Lipid Panel w/o Chol/HDL Ratio  Result Value Ref Range   Cholesterol, Total 215 (H) 100 - 199 mg/dL   Triglycerides 725103 0 - 149 mg/dL   HDL 366102 >44>39 mg/dL   VLDL Cholesterol Cal 21 5 - 40 mg/dL   LDL Calculated 92 0 - 99 mg/dL      Assessment & Plan:   Problem List Items Addressed This Visit    None    Visit Diagnoses    Cardiomegaly    -  Primary   Take 20 mg daily for the next 3 days   Hypoxia       Clear with nebulizer and pulse ox improved to 98%.  ? asthma flare vs CHF.  Follows with [name].  Will add Lasix for the next few days   SOB (shortness of breath)       Pt would like a nebulizer.  She is willing to buy one.  Nebulizer solution ordered   Severe persistent asthma with exacerbation       Rx for Clarithromyacin to take BID to r/o infectious process.  Start Nebulizer.  Continue Advair   Relevant Medications   albuterol (PROVENTIL) (5 MG/ML) 0.5% nebulizer solution      Discussed treatment and plan of care with Dr. Laural BenesJohnson  Follow up plan: Return in about 3 days (around 04/07/2017) for monday.

## 2017-04-07 ENCOUNTER — Encounter: Payer: Self-pay | Admitting: Unknown Physician Specialty

## 2017-04-07 ENCOUNTER — Ambulatory Visit (INDEPENDENT_AMBULATORY_CARE_PROVIDER_SITE_OTHER): Payer: BC Managed Care – PPO | Admitting: Unknown Physician Specialty

## 2017-04-07 VITALS — BP 160/79 | HR 89 | Temp 98.0°F | Wt 349.8 lb

## 2017-04-07 DIAGNOSIS — J4551 Severe persistent asthma with (acute) exacerbation: Secondary | ICD-10-CM

## 2017-04-07 MED ORDER — PREDNISONE 20 MG PO TABS: 40.0000 mg | ORAL_TABLET | Freq: Every day | ORAL | 0 refills | Status: DC

## 2017-04-07 NOTE — Progress Notes (Addendum)
BP (!) 160/79   Pulse 89   Temp 98 F (36.7 C) (Oral)   Wt (!) 349 lb 12.8 oz (158.7 kg)   LMP  (LMP Unknown)   SpO2 96%   BMI 60.04 kg/m    Subjective:    Patient ID: [name], female    DOB: 11/12/1940, 76 y.o.   MRN: [id number]  HPI: Barbara ApplebaumBetty M Meineke is a 76 y.o. female  Chief Complaint  Patient presents with  . URI    3 day f/up    Pt is here for asthma flare f/u.  She feels like the nebulizer is helping and using 4 times/day.  States she is better but not yet well.  She is still fatigued and SOB.  No fever  Relevant past medical, surgical, family and social history reviewed and updated as indicated. Interim medical history since our last visit reviewed. Allergies and medications reviewed and updated.  Review of Systems  Constitutional: Positive for fatigue. Negative for appetite change and chills.  Respiratory: Positive for cough, shortness of breath and wheezing. Negative for apnea.   Genitourinary: Negative.   Psychiatric/Behavioral: Negative.     Per HPI unless specifically indicated above     Objective:    BP (!) 160/79   Pulse 89   Temp 98 F (36.7 C) (Oral)   Wt (!) 349 lb 12.8 oz (158.7 kg)   LMP  (LMP Unknown)   SpO2 96%   BMI 60.04 kg/m   Wt Readings from Last 3 Encounters:  04/07/17 (!) 349 lb 12.8 oz (158.7 kg)  04/04/17 (!) 350 lb 12.8 oz (159.1 kg)  03/14/17 (!) 351 lb (159.2 kg)    Physical Exam  Constitutional: She is oriented to person, place, and time. She appears well-developed and well-nourished. No distress.  HENT:  Head: Normocephalic and atraumatic.  Eyes: Conjunctivae and lids are normal. Right eye exhibits no discharge. Left eye exhibits no discharge. No scleral icterus.  Neck: Normal range of motion. Neck supple. No JVD present. Carotid bruit is not present.  Cardiovascular: Normal rate, regular rhythm and normal heart sounds.  Pulmonary/Chest: Effort normal. She has decreased breath sounds. She has wheezes in the right upper  field and the left upper field. She has rhonchi in the right lower field.  Abdominal: Normal appearance. There is no splenomegaly or hepatomegaly.  Musculoskeletal: Normal range of motion.  Neurological: She is alert and oriented to person, place, and time.  Skin: Skin is warm, dry and intact. No rash noted. No pallor.  Psychiatric: She has a normal mood and affect. Her behavior is normal. Judgment and thought content normal.    Results for orders placed or performed in visit on 02/12/17  Comprehensive metabolic panel  Result Value Ref Range   Glucose 85 65 - 99 mg/dL   BUN 14 8 - 27 mg/dL   Creatinine, Ser 4.090.82 0.57 - 1.00 mg/dL   GFR calc non Af Amer 70 >59 mL/min/1.73   GFR calc Af Amer 80 >59 mL/min/1.73   BUN/Creatinine Ratio 17 12 - 28   Sodium 140 134 - 144 mmol/L   Potassium 4.8 3.5 - 5.2 mmol/L   Chloride 103 96 - 106 mmol/L   CO2 23 20 - 29 mmol/L   Calcium 10.1 8.7 - 10.3 mg/dL   Total Protein 6.8 6.0 - 8.5 g/dL   Albumin 4.5 3.5 - 4.8 g/dL   Globulin, Total 2.3 1.5 - 4.5 g/dL   Albumin/Globulin Ratio 2.0 1.2 - 2.2  Bilirubin Total 0.4 0.0 - 1.2 mg/dL   Alkaline Phosphatase 92 39 - 117 IU/L   AST 14 0 - 40 IU/L   ALT 10 0 - 32 IU/L  Lipid Panel Piccolo, Waived  Result Value Ref Range   Cholesterol Piccolo, Waived 213 (H) <200 mg/dL   HDL Chol Piccolo, Waived >100 >59 mg/dL   Triglycerides Piccolo,Waived 115 <150 mg/dL   VLDL Chol Calc Piccolo,Waive 23 <30 mg/dL  Lipid Panel w/o Chol/HDL Ratio  Result Value Ref Range   Cholesterol, Total 215 (H) 100 - 199 mg/dL   Triglycerides 086103 0 - 149 mg/dL   HDL 578102 >46>39 mg/dL   VLDL Cholesterol Cal 21 5 - 40 mg/dL   LDL Calculated 92 0 - 99 mg/dL      Assessment & Plan:   Problem List Items Addressed This Visit    None    Visit Diagnoses    Severe persistent asthma with exacerbation    -  Primary   Doing better but still needing additional support.  Continue with nebulizer.  Tubing sample given.  Add prednisone 40  mg daily for 5 days.  Out of work for 2 we   Relevant Medications   predniSONE (DELTASONE) 20 MG tablet       Follow up plan: Return if symptoms worsen or fail to improve.

## 2017-05-16 ENCOUNTER — Other Ambulatory Visit: Payer: Self-pay | Admitting: Unknown Physician Specialty

## 2017-05-16 ENCOUNTER — Encounter: Payer: Self-pay | Admitting: Unknown Physician Specialty

## 2017-05-16 ENCOUNTER — Ambulatory Visit (INDEPENDENT_AMBULATORY_CARE_PROVIDER_SITE_OTHER): Payer: BC Managed Care – PPO | Admitting: Unknown Physician Specialty

## 2017-05-16 VITALS — BP 141/77 | HR 59 | Temp 97.6°F

## 2017-05-16 DIAGNOSIS — E049 Nontoxic goiter, unspecified: Secondary | ICD-10-CM

## 2017-05-16 DIAGNOSIS — I10 Essential (primary) hypertension: Secondary | ICD-10-CM | POA: Diagnosis not present

## 2017-05-16 DIAGNOSIS — E785 Hyperlipidemia, unspecified: Secondary | ICD-10-CM | POA: Diagnosis not present

## 2017-05-16 DIAGNOSIS — I890 Lymphedema, not elsewhere classified: Secondary | ICD-10-CM | POA: Diagnosis not present

## 2017-05-16 DIAGNOSIS — F3340 Major depressive disorder, recurrent, in remission, unspecified: Secondary | ICD-10-CM

## 2017-05-16 MED ORDER — FUROSEMIDE 40 MG PO TABS: 40.0000 mg | ORAL_TABLET | Freq: Every day | ORAL | 0 refills | Status: DC | PRN

## 2017-05-16 NOTE — Assessment & Plan Note (Addendum)
Last LDL is to goal.  Continue present medications

## 2017-05-16 NOTE — Assessment & Plan Note (Signed)
Stable, continue present medications.   

## 2017-05-16 NOTE — Progress Notes (Signed)
BP (!) 141/77   Pulse (!) 59   Temp 97.6 F (36.4 C) (Oral)   LMP  (LMP Unknown)   SpO2 100%    Subjective:    Patient ID: [name], female    DOB: 08/23/1940, 76 y.o.   MRN: [id number]  HPI: [name] is a 76 y.o. female  Chief Complaint  Patient presents with  . Depression  . Hypertension   Hypertension Using medications without difficulty Average home BP's Not checking   No problems or lightheadedness No chest pain with exertion or shortness of breath No Edema  Hyperlipidemia Using medications without problems: No Muscle aches  Exercise: Decreased mobility  Depression This is stable.   [facility] 05/16/2017 02/12/2017 11/08/2016 07/10/2016 02/21/2016  Decreased Interest 0 0 0 0 0  Down, Depressed, Hopeless 0 0 0 0 1  PHQ - 2 Score 0 0 0 0 1  Altered sleeping 0 0 - 0 -  Tired, decreased energy 1 0 - 0 -  Change in appetite 0 0 - 0 -  Feeling bad or failure about yourself  0 0 - 0 -  Trouble concentrating 0 0 - 0 -  Moving slowly or fidgety/restless 0 0 - 0 -  Suicidal thoughts 0 0 - 0 -  PHQ-9 Score 1 0 - 0 -   Lymphedema Pt states she would like to start CBD oil as she heard it might help.    Goiter  Following with Dr. Jenne CampusMcQueen.  Would like her thyroid checked as she feels it is getting bigger  Relevant past medical, surgical, family and social history reviewed and updated as indicated. Interim medical history since our last visit reviewed. Allergies and medications reviewed and updated.  Review of Systems  Per HPI unless specifically indicated above     Objective:    BP (!) 141/77   Pulse (!) 59   Temp 97.6 F (36.4 C) (Oral)   LMP  (LMP Unknown)   SpO2 100%   Wt Readings from Last 3 Encounters:  04/07/17 (!) 349 lb 12.8 oz (158.7 kg)  04/04/17 (!) 350 lb 12.8 oz (159.1 kg)  03/14/17 (!) 351 lb (159.2 kg)    Physical Exam  Constitutional: She is oriented to person, place, and time. She appears well-developed and  well-nourished. No distress.  HENT:  Head: Normocephalic and atraumatic.  Eyes: Conjunctivae and lids are normal. Right eye exhibits no discharge. Left eye exhibits no discharge. No scleral icterus.  Neck: Normal range of motion. Neck supple. No JVD present. Carotid bruit is not present.  Cardiovascular: Normal rate, regular rhythm and normal heart sounds.  Pulmonary/Chest: Effort normal and breath sounds normal.  Abdominal: Normal appearance. There is no splenomegaly or hepatomegaly.  Musculoskeletal: Normal range of motion.  Neurological: She is alert and oriented to person, place, and time.  Skin: Skin is warm, dry and intact. No rash noted. No pallor.  Psychiatric: She has a normal mood and affect. Her behavior is normal. Judgment and thought content normal.    Results for orders placed or performed in visit on 02/12/17  Comprehensive metabolic panel  Result Value Ref Range   Glucose 85 65 - 99 mg/dL   BUN 14 8 - 27 mg/dL   Creatinine, Ser 4.090.82 0.57 - 1.00 mg/dL   GFR calc non Af Amer 70 >59 mL/min/1.73   GFR calc Af Amer 80 >59 mL/min/1.73   BUN/Creatinine Ratio 17 12 - 28   Sodium 140 134 -  144 mmol/L   Potassium 4.8 3.5 - 5.2 mmol/L   Chloride 103 96 - 106 mmol/L   CO2 23 20 - 29 mmol/L   Calcium 10.1 8.7 - 10.3 mg/dL   Total Protein 6.8 6.0 - 8.5 g/dL   Albumin 4.5 3.5 - 4.8 g/dL   Globulin, Total 2.3 1.5 - 4.5 g/dL   Albumin/Globulin Ratio 2.0 1.2 - 2.2   Bilirubin Total 0.4 0.0 - 1.2 mg/dL   Alkaline Phosphatase 92 39 - 117 IU/L   AST 14 0 - 40 IU/L   ALT 10 0 - 32 IU/L  Lipid Panel Piccolo, Waived  Result Value Ref Range   Cholesterol Piccolo, Waived 213 (H) <200 mg/dL   HDL Chol Piccolo, Waived >100 >59 mg/dL   Triglycerides Piccolo,Waived 115 <150 mg/dL   VLDL Chol Calc Piccolo,Waive 23 <30 mg/dL  Lipid Panel w/o Chol/HDL Ratio  Result Value Ref Range   Cholesterol, Total 215 (H) 100 - 199 mg/dL   Triglycerides 010103 0 - 149 mg/dL   HDL 272102 >53>39 mg/dL   VLDL  Cholesterol Cal 21 5 - 40 mg/dL   LDL Calculated 92 0 - 99 mg/dL      Assessment & Plan:   Problem List Items Addressed This Visit      Unprioritized   Depression    Stable, continue present medications.        Essential hypertension, benign    Stable, continue present medications.        Relevant Medications   irbesartan (AVAPRO) 300 MG tablet   furosemide (LASIX) 40 MG tablet   Hyperlipidemia    Last LDL is to goal.  Continue present medications      Relevant Medications   irbesartan (AVAPRO) 300 MG tablet   furosemide (LASIX) 40 MG tablet   Lymphedema    Refill Lasix.  OK for CBD oil that she read may help      Relevant Medications   furosemide (LASIX) 40 MG tablet    Other Visit Diagnoses    Goiter    -  Primary   Relevant Orders   TSH       Follow up plan: Return in about 6 months (around 11/14/2017).

## 2017-05-16 NOTE — Assessment & Plan Note (Addendum)
Refill Lasix.  OK for CBD oil that she read may help

## 2017-05-21 LAB — TSH: TSH: 3.1 u[IU]/mL (ref 0.450–4.500)

## 2017-05-22 ENCOUNTER — Other Ambulatory Visit: Payer: Self-pay | Admitting: Unknown Physician Specialty

## 2017-05-22 NOTE — Telephone Encounter (Signed)
Your patient 

## 2017-06-26 ENCOUNTER — Ambulatory Visit (INDEPENDENT_AMBULATORY_CARE_PROVIDER_SITE_OTHER): Payer: BC Managed Care – PPO | Admitting: Family Medicine

## 2017-06-26 ENCOUNTER — Encounter: Payer: Self-pay | Admitting: Family Medicine

## 2017-06-26 VITALS — BP 145/78 | HR 72 | Temp 99.4°F

## 2017-06-26 DIAGNOSIS — J111 Influenza due to unidentified influenza virus with other respiratory manifestations: Secondary | ICD-10-CM

## 2017-06-26 DIAGNOSIS — J4541 Moderate persistent asthma with (acute) exacerbation: Secondary | ICD-10-CM | POA: Diagnosis not present

## 2017-06-26 LAB — VERITOR FLU A/B WAIVED
INFLUENZA A: NEGATIVE
INFLUENZA B: NEGATIVE

## 2017-06-26 MED ORDER — ALBUTEROL SULFATE (5 MG/ML) 0.5% IN NEBU: 2.5000 mg | INHALATION_SOLUTION | Freq: Four times a day (QID) | RESPIRATORY_TRACT | 12 refills | Status: DC | PRN

## 2017-06-26 MED ORDER — BENZONATATE 200 MG PO CAPS: 200.0000 mg | ORAL_CAPSULE | Freq: Three times a day (TID) | ORAL | 0 refills | Status: DC | PRN

## 2017-06-26 MED ORDER — CLARITHROMYCIN 250 MG PO TABS: 250.0000 mg | ORAL_TABLET | Freq: Two times a day (BID) | ORAL | 0 refills | Status: DC

## 2017-06-26 MED ORDER — IRBESARTAN 300 MG PO TABS: 150.0000 mg | ORAL_TABLET | Freq: Every day | ORAL | 6 refills | Status: DC

## 2017-06-26 MED ORDER — HYDROCOD POLST-CPM POLST ER 10-8 MG/5ML PO SUER: 5.0000 mL | Freq: Two times a day (BID) | ORAL | 0 refills | Status: DC | PRN

## 2017-06-26 MED ORDER — BALOXAVIR MARBOXIL(80 MG DOSE) 2 X 40 MG PO TBPK
80.0000 mg | ORAL_TABLET | Freq: Once | ORAL | 0 refills | Status: AC
Start: 2017-06-26 — End: 2017-06-26

## 2017-06-26 NOTE — Progress Notes (Signed)
BP (!) 145/78 (BP Location: Right Wrist, Patient Position: Sitting, Cuff Size: Normal)   Pulse 72   Temp 99.4 F (37.4 C) (Oral)   LMP  (LMP Unknown)   SpO2 95%    Subjective:    Patient ID: [name], female    DOB: 05/07/1941, 77 y.o.   MRN: [id number]  HPI: [name] is a 77 y.o. female  Chief Complaint  Patient presents with  . Cough    Came home from work yesterday with some coughing, fastly progressed into other symptoms. Cough is productive.  . Chills  . Nasal Congestion  . Headache  . Fever   Pt here today with over a week of congestion and productive cough, worsening wheezing. Now having fevers, chills, headache, weakness, that came on quickly yesterday. Denies CP, N/V, SOB. Taking sambical with good relief of everything but the cough. Using her nebulizer a bit more than usual the past week or so. UTD on flu vaccine. Last dose of tylenol was this morning. Lots of sick contacts. Hx of asthma. Non-smoker.   Relevant past medical, surgical, family and social history reviewed and updated as indicated. Interim medical history since our last visit reviewed. Allergies and medications reviewed and updated.  Review of Systems  Constitutional: Positive for chills, diaphoresis, fatigue and fever.  HENT: Positive for congestion.   Eyes: Negative.   Respiratory: Positive for cough, chest tightness and wheezing.   Cardiovascular: Negative.   Gastrointestinal: Negative.   Genitourinary: Negative.   Musculoskeletal: Positive for myalgias.  Neurological: Positive for headaches.  Psychiatric/Behavioral: Negative.    Per HPI unless specifically indicated above     Objective:    BP (!) 145/78 (BP Location: Right Wrist, Patient Position: Sitting, Cuff Size: Normal)   Pulse 72   Temp 99.4 F (37.4 C) (Oral)   LMP  (LMP Unknown)   SpO2 95%   Wt Readings from Last 3 Encounters:  04/07/17 (!) 349 lb 12.8 oz (158.7 kg)  04/04/17 (!) 350 lb 12.8 oz (159.1 kg)  03/14/17 (!)  351 lb (159.2 kg)    Physical Exam  Constitutional: She appears well-developed and well-nourished. No distress.  HENT:  Head: Atraumatic.  Mouth/Throat: No oropharyngeal exudate.  Oropharynx and nasal mucosa erythematous and edematous, thick drainage present Mild b/l middle ear effusion  Eyes: Conjunctivae are normal. Pupils are equal, round, and reactive to light. No scleral icterus.  Neck: Normal range of motion. Neck supple.  Cardiovascular: Normal rate and normal heart sounds.  Pulmonary/Chest: Effort normal. No respiratory distress. She has wheezes.  Musculoskeletal: Normal range of motion.  Neurological: She is alert.  Skin: Skin is warm and dry.  Psychiatric: She has a normal mood and affect. Her behavior is normal.  Nursing note and vitals reviewed.  Results for orders placed or performed in visit on 06/26/17  Veritor Flu A/B Waived  Result Value Ref Range   Influenza A Negative Negative   Influenza B Negative Negative      Assessment & Plan:   Problem List Items Addressed This Visit      Respiratory   Asthma - Primary    With exacerbation from URI. Will tx with clarithromycin, tessalon, tussionex, and continued asthma/allergy regimen. Sedation/driving precautions reviewed at length. Return precautions given. Continue OTC remedies and supportive care.       Relevant Medications   albuterol (PROVENTIL) (5 MG/ML) 0.5% nebulizer solution    Other Visit Diagnoses    Influenza  Rapid testing neg, but sxs consistent. Will start xofluza and tx of underlying URI. OTC fever control and supportive care reviewed. Return precautions given   Relevant Medications   clarithromycin (BIAXIN) 250 MG tablet   Other Relevant Orders   Veritor Flu A/B Waived (Completed)       Follow up plan: Return for as scheduled.

## 2017-06-29 NOTE — Patient Instructions (Signed)
Follow up as needed

## 2017-06-29 NOTE — Assessment & Plan Note (Signed)
With exacerbation from URI. Will tx with clarithromycin, tessalon, tussionex, and continued asthma/allergy regimen. Sedation/driving precautions reviewed at length. Return precautions given. Continue OTC remedies and supportive care.

## 2017-07-04 ENCOUNTER — Telehealth: Payer: Self-pay | Admitting: Family Medicine

## 2017-07-04 MED ORDER — HYDROCOD POLST-CPM POLST ER 10-8 MG/5ML PO SUER: 5.0000 mL | Freq: Two times a day (BID) | ORAL | 0 refills | Status: DC | PRN

## 2017-07-04 MED ORDER — DOXYCYCLINE HYCLATE 100 MG PO TABS: 100.0000 mg | ORAL_TABLET | Freq: Two times a day (BID) | ORAL | 0 refills | Status: DC

## 2017-07-04 NOTE — Telephone Encounter (Signed)
Will switch to doxy, tussionex refilled and up front ready for pick up. Abx have been sent over to [facility]  Copied from CRM [phone]. Topic: Quick Communication - See Telephone Encounter >> Jul 04, 2017 10:10 AM Elliot GaultBell, Tiffany M wrote: CRM for notification. See Telephone encounter for:    Relation to pt: self Call back [phone] [id number] [place] [address] [facility] [phone] (Phone) [phone] (Fax)   Reason for call:  Patient last seen by Roosvelt Maserachel Lane 06/26/17 and stats chest congestion has not improved requesting another round of antibiotics and chlorpheniramine-HYDROcodone (TUSSIONEX PENNKINETIC ER) 10-8 MG/5ML SURE, please advise

## 2017-07-04 NOTE — Telephone Encounter (Signed)
Patient notified

## 2017-07-17 ENCOUNTER — Other Ambulatory Visit: Payer: Self-pay | Admitting: Unknown Physician Specialty

## 2017-07-22 ENCOUNTER — Ambulatory Visit: Payer: BC Managed Care – PPO | Admitting: Unknown Physician Specialty

## 2017-07-23 ENCOUNTER — Encounter: Payer: Self-pay | Admitting: Family Medicine

## 2017-07-23 ENCOUNTER — Ambulatory Visit
Admission: RE | Admit: 2017-07-23 | Discharge: 2017-07-23 | Disposition: A | Discharge status: Home / Self Care | Payer: BC Managed Care – PPO | Source: Ambulatory Visit | Attending: Family Medicine | Admitting: Family Medicine

## 2017-07-23 ENCOUNTER — Ambulatory Visit (INDEPENDENT_AMBULATORY_CARE_PROVIDER_SITE_OTHER): Payer: BC Managed Care – PPO | Admitting: Family Medicine

## 2017-07-23 VITALS — BP 146/67 | HR 58 | Temp 98.4°F

## 2017-07-23 DIAGNOSIS — J069 Acute upper respiratory infection, unspecified: Secondary | ICD-10-CM

## 2017-07-23 DIAGNOSIS — R9389 Abnormal findings on diagnostic imaging of other specified body structures: Secondary | ICD-10-CM | POA: Diagnosis not present

## 2017-07-23 DIAGNOSIS — R05 Cough: Secondary | ICD-10-CM | POA: Diagnosis not present

## 2017-07-23 DIAGNOSIS — R059 Cough, unspecified: Secondary | ICD-10-CM

## 2017-07-23 MED ORDER — HYDROCOD POLST-CPM POLST ER 10-8 MG/5ML PO SUER: 5.0000 mL | Freq: Two times a day (BID) | ORAL | 0 refills | Status: DC | PRN

## 2017-07-23 MED ORDER — PREDNISONE 20 MG PO TABS: 40.0000 mg | ORAL_TABLET | Freq: Every day | ORAL | 0 refills | Status: DC

## 2017-07-23 MED ORDER — AZITHROMYCIN 250 MG PO TABS: ORAL_TABLET | ORAL | 0 refills | Status: DC

## 2017-07-23 NOTE — Progress Notes (Signed)
   BP (!) 146/67   Pulse (!) 58   Temp 98.4 F (36.9 C) (Tympanic)   LMP  (LMP Unknown)   SpO2 98%    Subjective:    Patient ID: [name], female    DOB: 01/18/1941, 77 y.o.   MRN: [id number]  HPI: [name] is a 77 y.o. female  Chief Complaint  Patient presents with  . Cough    Has been seen x2 with little to no improvement, decreased urinary frequency pt concered about fluid in lungs   Pt here today for f/u sick sxs. Treated now multiple times with fair resolution of congestion but still with persistent hacking cough, chest tightness, wheezes. Has now tried clarithromycin and doxycycline. Seems to improve initially on them but sxs return. Denies fever, chills, body aches, CP.   Relevant past medical, surgical, family and social history reviewed and updated as indicated. Interim medical history since our last visit reviewed. Allergies and medications reviewed and updated.  Review of Systems  Per HPI unless specifically indicated above     Objective:    BP (!) 146/67   Pulse (!) 58   Temp 98.4 F (36.9 C) (Tympanic)   LMP  (LMP Unknown)   SpO2 98%   Wt Readings from Last 3 Encounters:  04/07/17 (!) 349 lb 12.8 oz (158.7 kg)  04/04/17 (!) 350 lb 12.8 oz (159.1 kg)  03/14/17 (!) 351 lb (159.2 kg)    Physical Exam  Constitutional: She is oriented to person, place, and time. She appears well-developed and well-nourished.  HENT:  Head: Atraumatic.  Right Ear: External ear normal.  Left Ear: External ear normal.  Mouth/Throat: No oropharyngeal exudate.  Oropharynx and nasal mucosa erythematous  Eyes: Conjunctivae are normal. Pupils are equal, round, and reactive to light.  Neck: Normal range of motion. Neck supple.  Cardiovascular: Normal rate and normal heart sounds.  Pulmonary/Chest: Effort normal. No respiratory distress. She has wheezes.  Musculoskeletal: Normal range of motion.  Neurological: She is alert and oriented to person, place, and time.  Skin: Skin  is warm and dry.  Psychiatric: She has a normal mood and affect. Her behavior is normal.  Nursing note and vitals reviewed.  Results for orders placed or performed in visit on 06/26/17  Veritor Flu A/B Waived  Result Value Ref Range   Influenza A Negative Negative   Influenza B Negative Negative      Assessment & Plan:   Problem List Items Addressed This Visit    None    Visit Diagnoses    Upper respiratory tract infection, unspecified type    -  Primary   Will obtain CXR given chronicity, continue inhalers, cough medication. Tx with zpack and prednisone. Return precautions reviewed   Relevant Medications   azithromycin (ZITHROMAX) 250 MG tablet   Other Relevant Orders   DG Chest 2 View (Completed)       Follow up plan: Return if symptoms worsen or fail to improve.

## 2017-07-26 NOTE — Patient Instructions (Signed)
Follow up as needed

## 2017-08-11 DIAGNOSIS — E041 Nontoxic single thyroid nodule: Secondary | ICD-10-CM | POA: Diagnosis not present

## 2017-08-11 DIAGNOSIS — R05 Cough: Secondary | ICD-10-CM | POA: Diagnosis not present

## 2017-08-11 DIAGNOSIS — B379 Candidiasis, unspecified: Secondary | ICD-10-CM | POA: Diagnosis not present

## 2017-08-12 ENCOUNTER — Encounter: Payer: Self-pay | Admitting: Internal Medicine

## 2017-08-12 ENCOUNTER — Telehealth: Payer: Self-pay | Admitting: Unknown Physician Specialty

## 2017-08-12 ENCOUNTER — Ambulatory Visit (INDEPENDENT_AMBULATORY_CARE_PROVIDER_SITE_OTHER): Payer: BC Managed Care – PPO | Admitting: Internal Medicine

## 2017-08-12 VITALS — BP 144/72 | HR 91 | Ht 63.0 in | Wt 350.0 lb

## 2017-08-12 DIAGNOSIS — J449 Chronic obstructive pulmonary disease, unspecified: Secondary | ICD-10-CM

## 2017-08-12 DIAGNOSIS — R9389 Abnormal findings on diagnostic imaging of other specified body structures: Secondary | ICD-10-CM | POA: Diagnosis not present

## 2017-08-12 DIAGNOSIS — G4719 Other hypersomnia: Secondary | ICD-10-CM

## 2017-08-12 MED ORDER — TIOTROPIUM BROMIDE MONOHYDRATE 1.25 MCG/ACT IN AERS: 2.0000 | INHALATION_SPRAY | Freq: Two times a day (BID) | RESPIRATORY_TRACT | 5 refills | Status: DC

## 2017-08-12 MED ORDER — TIOTROPIUM BROMIDE MONOHYDRATE 1.25 MCG/ACT IN AERS: 2.0000 | INHALATION_SPRAY | Freq: Every day | RESPIRATORY_TRACT | 5 refills | Status: DC

## 2017-08-12 NOTE — Patient Instructions (Addendum)
CONTINUE ADVAIR AS PRESCRIBED RINSE MOUTH OUT AFTER EVERY USE Continue ALBUTEROL AS NEEDED START SPIRIVA  GET CT CHEST Obtain PFT's   ASSESS FOR HOME SLEEP STUDY

## 2017-08-12 NOTE — Telephone Encounter (Signed)
Copied from CRM [id number]. Topic: General - Other >> Aug 12, 2017  1:16 PM Cecelia ByarsGreen, Maricruz Lucero L, RMA wrote: Reason for CRM: patient is calling requesting a call back concerning her power chair is broken and she is in need of another one and [facility] [employer]

## 2017-08-12 NOTE — Addendum Note (Signed)
Addended by: Maxwell MarionBLANKENSHIP, Kelton Bultman A on: 08/12/2017 11:39 AM   Modules accepted: Orders

## 2017-08-12 NOTE — Progress Notes (Signed)
Name: TEENA MANGUS MRN: [id number] DOB: 12-30-1940     CONSULTATION DATE: 3.26.19 REFERRING MD : Jenne Campus  CHIEF COMPLAINT: cough and abnormal CXR  STUDIES:  CXR independently reviewed 3.6.19   +left lung opacity noted Since Nov 2018    HISTORY OF PRESENT ILLNESS: 77 yo pleasant morbidly obese white female with a history of lymphedema for approximately 10 years Seen today for chronic respiratory symptoms which have been worsening over the last several months Symptoms include increased cough and shortness of breath along with wheezing  Patient was referred to [place] by Dr. Jenne Campus from ENT She had seen Dr. Jenne Campus for thyroid nodule and growth over the last 2 years supposedly is negative for malignancy and is following with  ENT annually  Patient states she had an upper respiratory tract infection in November 2018 Patient was prescribed antibiotics and cough syrup by her PCP Patient then subsequently had recurrent symptoms and was given prednisone 20 mg and a different antibiotic along with Tussionex Patient then had a second round of antibiotics with Z-Pak and prednisone 40 mg  Patient states that her cough did not improve Patient also was having signs and symptoms of flulike symptoms however was not tested and she seemed to be very sick at that time which occurred several months ago  Patient has been on Advair for the last several years Patient also uses nebulized albuterol therapy as needed Patient still with persistent cough and intermittent wheezing  Patient is a non-smoker however was exposed to secondhand smoke exposure from her husband for approximately 60 years She works as a Office manager and there is no history of environmental exposures  Patient does see Dr. Mariah Milling with cardiology for cardiac issues  Patient has been having excessive daytime sleepiness Patient has been having extreme fatigue and tiredness, lack of energy +  very Loud snoring every night + struggling breathe at  night and gasps for air      PAST MEDICAL HISTORY :   has a past medical history of Allergy, Arthritis, Asthma, Chronic fatigue syndrome, Fractured fibula, GERD (gastroesophageal reflux disease), Hypertension, Lymphedema (2004), Neuromuscular disorder (HCC), and Pneumonia.  has a past surgical history that includes Abdominal hysterectomy (1974); Cholecystectomy (2005); and Biopsy thyroid. Prior to Admission medications   Medication Sig Start Date End Date Taking? Authorizing Provider  albuterol (PROVENTIL HFA;VENTOLIN HFA) 108 (90 Base) MCG/ACT inhaler Inhale 2 puffs into the lungs every 6 (six) hours as needed for wheezing or shortness of breath. 03/14/17  Yes Gabriel Cirri, NP  albuterol (PROVENTIL) (5 MG/ML) 0.5% nebulizer solution Take 0.5 mLs (2.5 mg total) by nebulization every 6 (six) hours as needed for wheezing or shortness of breath. 06/26/17  Yes Particia Nearing, PA-C  Apple Cider Vinegar 500 MG TABS Take 500 mg by mouth 4 (four) times daily.   Yes [provider]  aspirin 81 MG tablet Take 81 mg by mouth daily.   Yes [provider]  atorvastatin (LIPITOR) 10 MG tablet Take 1 tablet (10 mg total) by mouth daily. 03/14/17 03/14/18 Yes Gabriel Cirri, NP  buPROPion (WELLBUTRIN SR) 150 MG 12 hr tablet TAKE 1 TABLET BY MOUTH EVERY 12 HOURS 07/18/17  Yes Gabriel Cirri, NP  BYSTOLIC 5 MG tablet TAKE 1 TABLET(5 MG) BY MOUTH DAILY Patient taking differently: take 1/2 tablet daily 09/23/16  Yes Gabriel Cirri, NP  chlorpheniramine-HYDROcodone (TUSSIONEX PENNKINETIC ER) 10-8 MG/5ML SUER Take 5 mLs by mouth every 12 (twelve) hours as needed for cough. 07/23/17  Yes Maurice March,  Salley Hews, PA-C  Cholecalciferol (VITAMIN D-3 PO) Take by mouth daily.   Yes [provider]  Fluticasone-Salmeterol (ADVAIR DISKUS) 250-50 MCG/DOSE AEPB Inhale 1 puff into the lungs 2 (two) times daily. 03/14/17  Yes Gabriel Cirri, NP  furosemide (LASIX) 40 MG tablet Take 1 tablet (40 mg  total) by mouth daily as needed. 05/16/17  Yes Gabriel Cirri, NP  Glucos-Chond-Hyal Ac-Ca Fructo (MOVE FREE JOINT HEALTH ADVANCE PO) Take by mouth daily.   Yes [provider]  irbesartan (AVAPRO) 300 MG tablet Take 0.5 tablets (150 mg total) by mouth daily. 06/26/17  Yes Particia Nearing, PA-C  LORazepam (ATIVAN) 1 MG tablet Take 1 tablet (1 mg total) by mouth daily as needed for anxiety. 1/2-1 tab 08/31/15  Yes Crissman, Redge Gainer, MD  magnesium oxide (MAG-OX) 400 MG tablet Take 400 mg by mouth daily.   Yes [provider]  mupirocin cream (BACTROBAN) 2 % APPLY EXTERNALLY TO THE AFFECTED AREA TWICE DAILY 07/15/16  Yes Crissman, Mark A, MD  OIL OF OREGANO PO Take 40 mg by mouth daily.   Yes [provider]  omeprazole (PRILOSEC) 20 MG capsule TAKE 1 CAPSULE(20 MG) BY MOUTH DAILY 05/23/17  Yes Gabriel Cirri, NP  Silver-Carboxymethylcellulose (AQUACEL-AG EXTRA HYDROFIBER) 2"X2" PADS Use daily prn 05/24/16  Yes Gabriel Cirri, NP  triamcinolone cream (KENALOG) 0.1 % APPLY TOPICALLY TO THE AFFECTED AREA TWICE DAILY 11/15/16  Yes Particia Nearing, PA-C  vitamin E 400 UNIT capsule Take 400 Units by mouth daily.   Yes [provider]   Allergies  Allergen Reactions  . Latex Rash  . Benicar [Olmesartan] Swelling  . Ciprofloxacin Other (See Comments)  . Diovan [Valsartan] Other (See Comments)    fatigue  . Flagyl [Metronidazole]   . Lotensin [Benazepril Hcl] Cough  . Penicillins   . Sulfa Antibiotics     FAMILY HISTORY:  family history includes Alcoholism in her father; Cancer in her brother; Diabetes in her father; Heart disease in her mother; Hyperlipidemia in her mother; Hypertension in her son; Stroke in her maternal grandmother. SOCIAL HISTORY:  reports that she has never smoked. She has never used smokeless tobacco. She reports that she does not drink alcohol or use drugs.  REVIEW OF SYSTEMS:   Constitutional: Negative for fever, chills, weight loss,  malaise/fatigue and diaphoresis.  HENT: Negative for hearing loss, ear pain, nosebleeds, congestion, sore throat, neck pain, tinnitus and ear discharge.   Eyes: Negative for blurred vision, double vision, photophobia, pain, discharge and redness.  Respiratory: +cough, -hemoptysis, -sputum production, +shortness of breath,+ wheezing and -stridor.   Cardiovascular: Negative for chest pain, palpitations, orthopnea, claudication, leg swelling and PND.  Gastrointestinal: Negative for heartburn, nausea, vomiting, abdominal pain, diarrhea, constipation, blood in stool and melena.  Genitourinary: Negative for dysuria, urgency, frequency, hematuria and flank pain.  Musculoskeletal: Negative for myalgias, back pain, joint pain and falls. +lymphedema Skin: Negative for itching and rash.  Neurological: Negative for dizziness, tingling, tremors, sensory change, speech change, focal weakness, seizures, loss of consciousness, weakness and headaches.  Endo/Heme/Allergies: Negative for environmental allergies and polydipsia. Does not bruise/bleed easily.  ALL OTHER ROS ARE NEGATIVE  BP (!) 144/72 (BP Location: Left Arm, Cuff Size: Normal)   Pulse 91   Ht 5\' 3"  (1.6 m)   Wt (!) 350 lb (158.8 kg)   LMP  (LMP Unknown)   SpO2 97%   BMI 62.00 kg/m   Physical Examination:   GENERAL:NAD, no fevers, chills, no weakness no fatigue  HEAD: Normocephalic, atraumatic.  EYES: Pupils equal, round, reactive to light. Extraocular muscles intact. No scleral icterus.  MOUTH: Moist mucosal membrane.   EAR, NOSE, THROAT: Clear without exudates. No external lesions.  NECK: Supple. No thyromegaly. No nodules. No JVD.  PULMONARY:CTA B/L no wheezes, no crackles, no rhonchi CARDIOVASCULAR: S1 and S2. Regular rate and rhythm. No murmurs, rubs, or gallops. No edema.  GASTROINTESTINAL: Soft, nontender, nondistended. No masses. Positive bowel sounds.  MUSCULOSKELETAL: +edema-chronic. Range of motion full in all extremities.    NEUROLOGIC: Cranial nerves II through XII are intact. No gross focal neurological deficits.  SKIN: No ulceration, lesions, rashes, or cyanosis. Skin warm and dry. Turgor intact.  PSYCHIATRIC: Mood, affect within normal limits. The patient is awake, alert and oriented x 3. Insight, judgment intact.      ASSESSMENT / PLAN: 77 year old pleasant white female with morbid obesity with chronic lymphedema presents today for persistent cough and wheezing with a history of upper respiratory tract infections and in the setting of secondhand smoke exposure.  These findings suggest probable underlying COPD, also has signs and symptoms with snoring and excessive daytime sleepiness and shortness of breath at night with probable underlying sleep apnea.  I have reviewed her chest x-rays personally and there is a persistent left midlung opacification noted and will need further imaging studies to evaluate this.  At this time I would recommend several options and therapies for her  #1 continue Advair as prescribed #2 start Spiriva Respimat therapy #3 albuterol nebulizers and MDIs as needed #4 obtain sleep study to assess for sleep apnea #5 obtain Pulmonary Function testing #6 CT chest to assess Left lung opacity    Patient/Family are satisfied with Plan of action and management. All questions answered Follow up after tests completed   Lucie LeatherKurian David Malcom Selmer, M.D.  [facility]  Medical Director [facility] Medical Director [facility]

## 2017-08-12 NOTE — Telephone Encounter (Signed)
Copied from CRM [id number]. Topic: General - Other >> Aug 12, 2017  2:35 PM Gerrianne ScalePayne, Takoda Siedlecki L wrote: Reason for CRM: Aly[facility]set[facility]Ryan [facility] [phone] would like to see if pt qualify for a power wheel chair they had faxed paperwork over today they need OV notes about the wheel chair HT WT DX codes and RX for the chair

## 2017-08-13 NOTE — Telephone Encounter (Signed)
[facility] They need a RX and OV notes documenting patient's need for the power wheelchair and diagnosis codes. Patient has upcoming appointment 08/19/17, will call patient and let her know.

## 2017-08-13 NOTE — Telephone Encounter (Signed)
Called and left patient a VM letting her know that I received the paperwork from [facility] and what they need from us. Let patient know that we would get the documentation at her OV next week and then fax the information back. Asked for patient to call back in the meantime with any questions or concerns.

## 2017-08-15 ENCOUNTER — Ambulatory Visit: Payer: BC Managed Care – PPO | Admitting: Unknown Physician Specialty

## 2017-08-19 ENCOUNTER — Encounter: Payer: Self-pay | Admitting: Unknown Physician Specialty

## 2017-08-19 ENCOUNTER — Ambulatory Visit (INDEPENDENT_AMBULATORY_CARE_PROVIDER_SITE_OTHER): Payer: BC Managed Care – PPO | Admitting: Unknown Physician Specialty

## 2017-08-19 DIAGNOSIS — I890 Lymphedema, not elsewhere classified: Secondary | ICD-10-CM

## 2017-08-19 DIAGNOSIS — I10 Essential (primary) hypertension: Secondary | ICD-10-CM

## 2017-08-19 DIAGNOSIS — J4541 Moderate persistent asthma with (acute) exacerbation: Secondary | ICD-10-CM

## 2017-08-19 DIAGNOSIS — E785 Hyperlipidemia, unspecified: Secondary | ICD-10-CM | POA: Diagnosis not present

## 2017-08-19 DIAGNOSIS — E041 Nontoxic single thyroid nodule: Secondary | ICD-10-CM

## 2017-08-19 DIAGNOSIS — Z7409 Other reduced mobility: Secondary | ICD-10-CM

## 2017-08-19 NOTE — Assessment & Plan Note (Signed)
Apparently benign.  Per Dr. Evangeline GulaMcqeen

## 2017-08-19 NOTE — Assessment & Plan Note (Signed)
Restarted her Advair and taking Spireva.  She feels this has helped. Other tests pending: pulmonary function, CT, and sleep study

## 2017-08-19 NOTE — Assessment & Plan Note (Signed)
Restart statin.  Completed antibiotics

## 2017-08-19 NOTE — Assessment & Plan Note (Signed)
Stable, continue present medications.   

## 2017-08-19 NOTE — Progress Notes (Signed)
BP (!) 142/94   Pulse 60   Temp 97.9 F (36.6 C) (Oral)   Ht 5\' 3"  (1.6 m)   Wt (!) 350 lb (158.8 kg)   LMP  (LMP Unknown)   SpO2 98%   BMI 62.00 kg/m    Subjective:    Patient ID: [name], female    DOB: 08/01/1940, 77 y.o.   MRN: [id number]  HPI: Barbara ApplebaumBetty M Fergeson is a 77 y.o. female  Chief Complaint  Patient presents with  . Power Wheel Chair    pt needs documentation in OV note as to why she needs a pwc and diagnosis codes  . Hyperlipidemia  . Hypertension   Asthma Pt saw pulmonary on 3/26 due to persistent respiratory symptoms. Note was reviewed:  Suspicion of COPD process due to history of second-hand smoke.  The following suggestions were made: #1 continue Advair as prescribed #2 start Spiriva Respimat therapy #3 albuterol nebulizers and MDIs as needed #4 obtain sleep study to assess for sleep apnea #5 obtain Pulmonary Function testing #6 CT chest to assess Left lung opacity  Thyroid nodule Followed by ENT  Hypertension Using medications without difficulty Average home BPs   No problems or lightheadedness No chest pain with exertion or shortness of breath No Edema  Hyperlipidemia Using medications without problems:  Stopped statins while on antibiotic No Muscle aches   Poor mobility Pt needs a power chair.  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  She is unable to complete her ADLs using a walker as she is too worn out with walking from room to room.  She can stand for no longer than 2 minutes.  If on a walker, needs to sit every minute.  Knees are catching and at risk for falling.  She does not have the upper body strength to operate a manual wheelchair and complains of persistent shoulder pain.    Relevant past medical, surgical, family and social history reviewed and updated as indicated. Interim medical history since our last visit reviewed. Allergies and medications reviewed and updated.  Review of Systems  Constitutional:  Negative.   Neurological: Negative.   Psychiatric/Behavioral: Negative.     Per HPI unless specifically indicated above     Objective:    BP (!) 142/94   Pulse 60   Temp 97.9 F (36.6 C) (Oral)   Ht 5\' 3"  (1.6 m)   Wt (!) 350 lb (158.8 kg)   LMP  (LMP Unknown)   SpO2 98%   BMI 62.00 kg/m   Wt Readings from Last 3 Encounters:  08/19/17 (!) 350 lb (158.8 kg)  08/12/17 (!) 350 lb (158.8 kg)  04/07/17 (!) 349 lb 12.8 oz (158.7 kg)    Physical Exam  Constitutional: She is oriented to person, place, and time. She appears well-developed and well-nourished. No distress.  HENT:  Head: Normocephalic and atraumatic.  Eyes: Conjunctivae and lids are normal. Right eye exhibits no discharge. Left eye exhibits no discharge. No scleral icterus.  Neck: Normal range of motion. Neck supple. No JVD present. Carotid bruit is not present.  Cardiovascular: Normal rate, regular rhythm and normal heart sounds.  Pulmonary/Chest: Effort normal and breath sounds normal.  Abdominal: Normal appearance. There is no splenomegaly or hepatomegaly.  Musculoskeletal: Normal range of motion.  Neurological: She is alert and oriented to person, place, and time.  Skin: Skin is warm, dry and intact. No rash noted. No pallor.  Psychiatric: She has a  normal mood and affect. Her behavior is normal. Judgment and thought content normal.    Results for orders placed or performed in visit on 06/26/17  Veritor Flu A/B Waived  Result Value Ref Range   Influenza A Negative Negative   Influenza B Negative Negative      Assessment & Plan:   Problem List Items Addressed This Visit      Unprioritized   Asthma    Restarted her Advair and taking Spireva.  She feels this has helped. Other tests pending: pulmonary function, CT, and sleep study      Essential hypertension, benign    Stable, continue present medications.        Hyperlipidemia    Restart statin.  Completed antibiotics      Lymphedema    Continue  with massage therapy.        Mobility impaired    Recommended a power chair to accomplish ADLs.        Thyroid nodule    Apparently benign.  Per Dr. Evangeline Gula          Follow up plan: Return in about 6 months (around 02/18/2018) for physical.

## 2017-08-19 NOTE — Assessment & Plan Note (Signed)
Continue with massage therapy.

## 2017-08-19 NOTE — Assessment & Plan Note (Signed)
Recommended a power chair to accomplish ADLs.

## 2017-08-21 ENCOUNTER — Other Ambulatory Visit: Payer: Self-pay | Admitting: Unknown Physician Specialty

## 2017-08-21 NOTE — Telephone Encounter (Signed)
Rx  Refill   Clarification  On  Medication   Dose  Bystolic 5 mg   -   Note recorded  On  Med  List  States  Patient taking   1/2  Tab   Daily   Reported  on 11/08/2016  LOV 08/19/2017  Pharmacy  On file

## 2017-09-05 ENCOUNTER — Ambulatory Visit: Payer: BC Managed Care – PPO | Attending: Internal Medicine

## 2017-09-05 DIAGNOSIS — G4733 Obstructive sleep apnea (adult) (pediatric): Secondary | ICD-10-CM | POA: Diagnosis not present

## 2017-09-08 ENCOUNTER — Ambulatory Visit
Admission: RE | Admit: 2017-09-08 | Discharge: 2017-09-08 | Disposition: A | Discharge status: Home / Self Care | Payer: BC Managed Care – PPO | Source: Ambulatory Visit | Attending: Unknown Physician Specialty | Admitting: Unknown Physician Specialty

## 2017-09-08 DIAGNOSIS — E041 Nontoxic single thyroid nodule: Secondary | ICD-10-CM

## 2017-09-09 ENCOUNTER — Ambulatory Visit: Payer: BC Managed Care – PPO | Attending: Internal Medicine

## 2017-09-09 DIAGNOSIS — J449 Chronic obstructive pulmonary disease, unspecified: Secondary | ICD-10-CM | POA: Diagnosis not present

## 2017-09-09 DIAGNOSIS — R9389 Abnormal findings on diagnostic imaging of other specified body structures: Secondary | ICD-10-CM

## 2017-09-10 ENCOUNTER — Ambulatory Visit
Admission: RE | Admit: 2017-09-10 | Discharge: 2017-09-10 | Disposition: A | Discharge status: Home / Self Care | Payer: BC Managed Care – PPO | Source: Ambulatory Visit | Attending: Internal Medicine | Admitting: Internal Medicine

## 2017-09-10 DIAGNOSIS — I2510 Atherosclerotic heart disease of native coronary artery without angina pectoris: Secondary | ICD-10-CM | POA: Insufficient documentation

## 2017-09-10 DIAGNOSIS — R9389 Abnormal findings on diagnostic imaging of other specified body structures: Secondary | ICD-10-CM | POA: Diagnosis present

## 2017-09-10 DIAGNOSIS — E049 Nontoxic goiter, unspecified: Secondary | ICD-10-CM | POA: Diagnosis not present

## 2017-09-10 DIAGNOSIS — I10 Essential (primary) hypertension: Secondary | ICD-10-CM | POA: Diagnosis not present

## 2017-09-10 DIAGNOSIS — I700 Atherosclerosis of aorta: Secondary | ICD-10-CM | POA: Diagnosis not present

## 2017-09-10 DIAGNOSIS — R05 Cough: Secondary | ICD-10-CM | POA: Diagnosis not present

## 2017-09-10 DIAGNOSIS — J069 Acute upper respiratory infection, unspecified: Secondary | ICD-10-CM | POA: Diagnosis not present

## 2017-09-10 NOTE — Telephone Encounter (Signed)
[facility]. They need a rx, office notes, and diagnosis faxed to them at [phone].

## 2017-09-12 ENCOUNTER — Telehealth: Payer: Self-pay | Admitting: Unknown Physician Specialty

## 2017-09-12 DIAGNOSIS — G4733 Obstructive sleep apnea (adult) (pediatric): Secondary | ICD-10-CM | POA: Diagnosis not present

## 2017-09-12 NOTE — Telephone Encounter (Signed)
Copied from CRM [id number]. Topic: Quick Communication - Rx Refill/Question >> Sep 12, 2017  2:24 PM Jonette EvaBarksdale, Harvey B wrote: [facility] called to give NP Jamesetta OrleansWicker a different fax # b/c they are not getting the order for the power chair, fax: [phone], call [facility] if needed [phone] They need Rx and diagnosis code and notes

## 2017-09-12 NOTE — Telephone Encounter (Signed)
RX, note, and insurance info [facility]ed to [facility] per patient request. Tried calling to let her know but there was no answer and VM box was full so I could not leave a message.

## 2017-09-15 ENCOUNTER — Ambulatory Visit: Payer: BC Managed Care – PPO

## 2017-09-15 NOTE — Telephone Encounter (Signed)
RX, diagnosis codes, OV note and demographics faxed to [facility] at the number requested.

## 2017-09-17 ENCOUNTER — Telehealth: Payer: Self-pay | Admitting: *Deleted

## 2017-09-17 NOTE — Telephone Encounter (Signed)
Called patient with results of sleep study. Voicemail box full.  DOS 09/05/2017 Auto-cpap with pressure range of 5-15 cnH20.

## 2017-09-18 ENCOUNTER — Ambulatory Visit (INDEPENDENT_AMBULATORY_CARE_PROVIDER_SITE_OTHER): Payer: BC Managed Care – PPO | Admitting: Internal Medicine

## 2017-09-18 ENCOUNTER — Encounter: Payer: Self-pay | Admitting: Internal Medicine

## 2017-09-18 VITALS — BP 132/78 | HR 68 | Ht 63.0 in

## 2017-09-18 DIAGNOSIS — J683 Other acute and subacute respiratory conditions due to chemicals, gases, fumes and vapors: Secondary | ICD-10-CM | POA: Diagnosis not present

## 2017-09-18 DIAGNOSIS — G4733 Obstructive sleep apnea (adult) (pediatric): Secondary | ICD-10-CM | POA: Diagnosis not present

## 2017-09-18 DIAGNOSIS — E66813 Obesity, class 3: Secondary | ICD-10-CM

## 2017-09-18 MED ORDER — HYDROCOD POLST-CPM POLST ER 10-8 MG/5ML PO SUER: 5.0000 mL | Freq: Two times a day (BID) | ORAL | Status: DC | PRN

## 2017-09-18 MED ORDER — HYDROCOD POLST-CPM POLST ER 10-8 MG/5ML PO SUER: 5.0000 mL | Freq: Two times a day (BID) | ORAL | 0 refills | Status: DC | PRN

## 2017-09-18 MED ORDER — FLUTICASONE-SALMETEROL 250-50 MCG/DOSE IN AEPB: 1.0000 | INHALATION_SPRAY | Freq: Two times a day (BID) | RESPIRATORY_TRACT | 0 refills | Status: DC

## 2017-09-18 NOTE — Progress Notes (Signed)
Name: [name] MRN: [id number] DOB: 01/02/1941     CONSULTATION DATE: 3.26.19 REFERRING MD : Jenne Campus  CHIEF COMPLAINT: cough and abnormal CXR  STUDIES:  CXR independently reviewed 3.6.19   +left lung opacity noted Since Nov 2018  Ct chest 4.24.19 I have Independently reviewed images of  CT chest  on 09/18/2017 Interpretation:Lweft mid lung opacity mild, unchanged on CXR for last 2 years  SLeep Study 4.2019 AHi 16, AHi 42 with REM sleep  PFT's 4.23.19 No obvious signs of obstructive lung disease No restrictive lung disease   ALL test results discussed with patient and family  HISTORY OF PRESENT ILLNESS: 77 yo pleasant morbidly obese white female with a history of lymphedema for approximately 10 years Seen today for chronic respiratory symptoms which have been worsening over the last several months Symptoms include increased cough and shortness of breath along with wheezing-most likley related to weight and underlying intermittent reactive airways disease  Patient was referred to [place] by Dr. Jenne Campus from ENT She had seen Dr. Jenne Campus for thyroid nodule and growth over the last 2 years supposedly is negative for malignancy and is following with  ENT annually  Patient states she had an upper respiratory tract infection in November 2018 Ct chest follow up does NOT show any significant lung findings    Patient states that her cough slowly improved Patient also was having signs and symptoms of flu like symptoms however was not tested and she seemed to be very sick at that time which occurred several months ago  Patient has been on Advair for the last several years Recently started on Spiriva Patient also uses nebulized albuterol therapy as needed Patient still with mild  persistent cough and intermittent wheezing  Patient is a non-smoker however was exposed to secondhand smoke exposure from her husband for approximately 60 years She works as a Office manager and there is no history of  environmental exposures  Patient does see Dr. Mariah Milling with cardiology for cardiac issues  Patient has been having excessive daytime sleepiness Patient has been having extreme fatigue and tiredness, lack of energy +  very Loud snoring every night + struggling breathe at night and gasps for air    REVIEW OF SYSTEMS:   Constitutional: Negative for fever, chills, weight loss, malaise/fatigue and diaphoresis.  HENT: Negative for hearing loss, ear pain, nosebleeds, congestion, sore throat, neck pain, tinnitus and ear discharge.   Eyes: Negative for blurred vision, double vision, photophobia, pain, discharge and redness.  Respiratory: +cough, -hemoptysis, -sputum production, +shortness of breath,- wheezing and -stridor.   Cardiovascular: Negative for chest pain, palpitations, orthopnea, claudication, leg swelling and PND.  Gastrointestinal: Negative for heartburn, nausea, vomiting, abdominal pain, diarrhea, constipation, blood in stool and melena.  Genitourinary: Negative for dysuria, urgency, frequency, hematuria and flank pain.  Musculoskeletal: Negative for myalgias, back pain, joint pain and falls. +lymphedema Skin: Negative for itching and rash.  Neurological: Negative for dizziness, tingling, tremors, sensory change, speech change, focal weakness, seizures, loss of consciousness, weakness and headaches.  Endo/Heme/Allergies: Negative for environmental allergies and polydipsia. Does not bruise/bleed easily.  ALL OTHER ROS ARE NEGATIVE  BP 132/78 (BP Location: Left Arm, Cuff Size: Normal)   Pulse 68   Ht  (1.6 m)   LMP  (LMP Unknown)   SpO2 100%   BMI 62.00 kg/m   Physical Examination:   GENERAL:NAD, no fevers, chills, no weakness no fatigue HEAD: Normocephalic, atraumatic.  EYES: Pupils equal, round, reactive to light. Extraocular muscles intact.  No scleral icterus.  MOUTH: Moist mucosal membrane.   EAR, NOSE, THROAT: Clear without exudates. No external lesions.  NECK:  Supple. No thyromegaly. No nodules. No JVD.  PULMONARY:CTA B/L no wheezes, no crackles, no rhonchi CARDIOVASCULAR: S1 and S2. Regular rate and rhythm. No murmurs, rubs, or gallops. No edema.  GASTROINTESTINAL: Soft, nontender, nondistended. No masses. Positive bowel sounds.  MUSCULOSKELETAL: +edema-chronic. Range of motion full in all extremities.  NEUROLOGIC: Cranial nerves II through XII are intact. No gross focal neurological deficits.  SKIN: No ulceration, lesions, rashes, or cyanosis. Skin warm and dry. Turgor intact.  PSYCHIATRIC: Mood, affect within normal limits. The patient is awake, alert and oriented x 3. Insight, judgment intact.      ASSESSMENT / PLAN: 77 year old pleasant white female with morbid obesity with chronic lymphedema presents today for persistent cough and wheezing with a history of upper respiratory tract infections and in the setting of secondhand smoke exposure.  These findings suggest probable underlying reactive airways disease with severe morbid obesity  also has signs and symptoms with snoring and excessive daytime sleepiness and shortness of breath at night with DX of OSA  I have reviewed her chest x-rays personally and there is a persistent left midlung opacification noted +scarv tissue-no significant changes in last 2 years  At this time I would recommend several options and therapies for her  #1 continue Advair as prescribed #2 continue Spiriva Respimat therapy #3 albuterol nebulizers and MDIs as needed #4 +OSa with AHI 16 and AHi 42 in REM sleep Will place order for autoCPAP therapy and will need to follow up in 3 months for assessment #5 cough-Tussionex as needed    Patient/Family are satisfied with Plan of action and management. All questions answered   Lucie Leather, M.D.  [facility]  Medical Director [facility] Medical Director [facility] Cardio-Pulmonary Department

## 2017-09-18 NOTE — Patient Instructions (Signed)
Continue inhalers as needed Will need OSA therapy Tussionex as needed Re-start Advair

## 2017-09-18 NOTE — Telephone Encounter (Signed)
Pt was in clinic today. Results of sleep study given. Orders placed.

## 2017-10-06 ENCOUNTER — Telehealth: Payer: Self-pay | Admitting: Internal Medicine

## 2017-10-06 NOTE — Telephone Encounter (Signed)
Pt calling stating she's not heard from [facility] and was advised to call us if they did not call her   Please call back

## 2017-10-06 NOTE — Telephone Encounter (Signed)
Called and spoke with Verlon Au at [facility] and she stated that order has been received.  The delay was b/c Christoper Allegra was trying to determine which insurance was primary.  Verlon Au stated that she would elevate order and would get patient contacted this week to schedule CPAP.  Advised patient that if she hasn't heard from [facility] by Thursday to contact me.  Pt voiced understanding. Rhonda J Cobb

## 2017-10-18 ENCOUNTER — Other Ambulatory Visit: Payer: Self-pay | Admitting: Unknown Physician Specialty

## 2017-12-05 ENCOUNTER — Encounter: Payer: Self-pay | Admitting: Unknown Physician Specialty

## 2017-12-05 ENCOUNTER — Ambulatory Visit (INDEPENDENT_AMBULATORY_CARE_PROVIDER_SITE_OTHER): Payer: BC Managed Care – PPO | Admitting: Unknown Physician Specialty

## 2017-12-05 DIAGNOSIS — I10 Essential (primary) hypertension: Secondary | ICD-10-CM | POA: Diagnosis not present

## 2017-12-05 DIAGNOSIS — Z7409 Other reduced mobility: Secondary | ICD-10-CM | POA: Diagnosis not present

## 2017-12-05 MED ORDER — NEBIVOLOL HCL 5 MG PO TABS: 2.5000 mg | ORAL_TABLET | Freq: Two times a day (BID) | ORAL | 12 refills | Status: DC

## 2017-12-05 NOTE — Assessment & Plan Note (Signed)
Note written for power chair.

## 2017-12-05 NOTE — Progress Notes (Addendum)
BP (!) 147/78 (BP Location: Left Arm)   Pulse (!) 58   Temp 98 F (36.7 C) (Oral)   Ht 5\' 3"  (1.6 m)   Wt (!) 317 lb (143.8 kg)   LMP  (LMP Unknown)   SpO2 97%   BMI 56.15 kg/m    Subjective:    Patient ID: [name], female    DOB: 02/01/1941, 77 y.o.   MRN: [id number]  HPI: [name] is a 77 y.o. female  Chief Complaint  Patient presents with  . Movility evaluation  . 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    [name] has has severe lymphedema and knee osteoarthritis which limits her ability to walk without falling after a couple of steps.  Therefore, her ability to walk  from room to room cannot be done with a walker.  At this time, she can stand less than a minute and can only transfer from bed to chair or chair to chair  She does not have the upper body strength to operate a manual wheelchair due to persistent shoulder pain and lymphedema in both arms.    She requires a personal motorized device (PMD) to move from room to room for ADLs.  Specifically she will need a PMD to go to the bathroom for toileting, to the kitchen to prepare meals, and to the bedroom in order to dress appropriately.    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  The only way this is possible is with a power chair.    Please note, [name] is a long time patient of mine and always strives to do as much as she can do and stay independent.  This is proven by her long work record despite physical limitations.  Therefore, it is my recommendation, for her to be able to care for herself and others, that she receives a power chair.     Relevant past  medical, surgical, family and social history reviewed and updated as indicated. Interim medical history since our last visit reviewed. Allergies and medications reviewed and updated.  Review of Systems  Per HPI unless specifically indicated above     Objective:    BP (!) 147/78 (BP Location: Left Arm)   Pulse (!) 58   Temp 98 F (36.7 C) (Oral)   Ht 5\' 3"  (1.6 m)   Wt (!) 317 lb (143.8 kg)   LMP  (LMP Unknown)   SpO2 97%   BMI 56.15 kg/m   Wt Readings from Last 3 Encounters:  12/05/17 (!) 317 lb (143.8 kg)  08/19/17 (!) 350 lb (158.8 kg)  08/12/17 (!) 350 lb (158.8 kg)    Physical Exam  Constitutional: She is oriented to person, place, and time. She appears well-developed and well-nourished. No distress.  In a wheelchair  HENT:  Head: Normocephalic and atraumatic.  Eyes: Conjunctivae and lids are normal. Right eye exhibits no discharge. Left eye exhibits no discharge. No scleral icterus.  Neck: Normal range of motion. Neck supple. No JVD present. Carotid bruit is not present.  Cardiovascular: Normal rate, regular rhythm and normal  heart sounds.  Pulmonary/Chest: Effort normal and breath sounds normal.  Abdominal: Normal appearance. There is no splenomegaly or hepatomegaly.  Musculoskeletal: She exhibits edema.  Neurological: She is alert and oriented to person, place, and time.  Skin: Skin is warm, dry and intact. No rash noted. No pallor.  Psychiatric: She has a normal mood and affect. Her behavior is normal. Judgment and thought content normal.    Results for orders placed or performed in visit on 06/26/17  Veritor Flu A/B Waived  Result Value Ref Range   Influenza A Negative Negative   Influenza B Negative Negative      Assessment & Plan:   Problem List Items Addressed This Visit      Unprioritized   Essential hypertension, benign    Not to goal.  Takes 1/2 Bystolic per day.  Increase to 1/2 twice a day to improve BP.        Relevant Medications    nebivolol (BYSTOLIC) 5 MG tablet   Mobility impaired    Note written for power chair.            Follow up plan: Return in about 1 month (around 01/02/2018).

## 2017-12-05 NOTE — Assessment & Plan Note (Addendum)
Not to goal.  Takes 1/2 Bystolic per day.  Increase to 1/2 twice a day to improve BP.

## 2017-12-09 ENCOUNTER — Ambulatory Visit: Payer: Self-pay | Admitting: Internal Medicine

## 2017-12-15 ENCOUNTER — Telehealth: Payer: Self-pay | Admitting: Unknown Physician Specialty

## 2017-12-15 NOTE — Telephone Encounter (Signed)
Called and spoke to patient. She states that she needs her chart note updated (fax received regarding this and [facility] Will fax back once completed. Patient states that she does need the PT referral, states she thinks [facility]. She asked that if she is called about this appointment, if a VM could be left because she is not able to answer her phone due to being in a training this week. Can we please put this information in the referral note? Thanks.

## 2017-12-15 NOTE — Addendum Note (Signed)
Addended by: Gabriel CirriWICKER, Shaleah Nissley on: 12/15/2017 12:38 PM   Modules accepted: Orders

## 2017-12-15 NOTE — Telephone Encounter (Signed)
Copied from CRM [id number]. Topic: Quick Communication - See Telephone Encounter >> Dec 15, 2017 10:14 AM Louie BunPalacios Medina, Rosey Batheresa D wrote: CRM for notification. See Telephone encounter for: 12/15/17. Pt would [facility] to [facility] about referral to PT and about paperwork for her wheelchair. She can be reached between 11:45-12:45 due to she is in meeting. Please call patient back, thanks.

## 2017-12-31 NOTE — Progress Notes (Signed)
   Subjective:    Patient ID: [name], female    DOB: 05/27/1940, 77 y.o.   MRN: [id number]  [name] is a 77 y.o. female presenting on 01/01/2018 for Hypertension   HPI  HTN: Pt presenting for follow up today. Bystolic was increased from 2.5 mg daily to 2.5 mg BID. She has been tolerating this well. She says she normally gets a very low pulse with higher levels of beta blockers. She has intolerance to ACEi/ARB. Apparently had reduced kidney function with previous diuretic.   Skin Rash: Reports redness of skin on her right forearm and heat this morning. She has a history of cellulitis.   BP Readings from Last 3 Encounters:  01/01/18 138/72  01/01/18 138/72  12/05/17 (!) 147/78    Social History   Tobacco Use  . Smoking status: Never Smoker  . Smokeless tobacco: Never Used  Substance Use Topics  . Alcohol use: No    Alcohol/week: 0.0 standard drinks  . Drug use: No    Review of Systems Per HPI unless specifically indicated above     Objective:    BP 138/72 (BP Location: Right Wrist, Patient Position: Sitting)   Pulse 64   Temp 97.7 F (36.5 C) (Oral)   Ht 5\' 3"  (1.6 m)   Wt (!) 347 lb (157.4 kg)   LMP  (LMP Unknown)   SpO2 96%   BMI 61.47 kg/m   Wt Readings from Last 3 Encounters:  01/01/18 (!) 347 lb (157.4 kg)  01/01/18 (!) 347 lb (157.4 kg)  12/05/17 (!) 317 lb (143.8 kg)    Physical Exam  Constitutional: She is oriented to person, place, and time. She appears well-developed and well-nourished.  Cardiovascular: Normal rate, regular rhythm and normal heart sounds.  Pulmonary/Chest: Effort normal and breath sounds normal.  Neurological: She is alert and oriented to person, place, and time.  Skin: Skin is warm and dry.     Psychiatric: She has a normal mood and affect. Her behavior is normal.   Results for orders placed or performed in visit on 06/26/17  Veritor Flu A/B Waived  Result Value Ref Range   Influenza A Negative Negative   Influenza B  Negative Negative      Assessment & Plan:  1. Essential hypertension, benign  BP not well controlled. Patient would like to trend Bps and return in two months. May consider small dose amlodipine.   2. Redness of skin  Not overly concerning for cellulitis. I will give doxycycline Rx for if she worsens but I have instructed her to wait one day to monitor for improvement.     Follow up plan: Return in about 2 months (around 03/03/2018) for CPE/ follow up .  Osvaldo AngstAdriana Pollak, PA-C [facility]  [facility] 01/01/2018, 4:03 PM

## 2018-01-01 ENCOUNTER — Other Ambulatory Visit: Payer: Self-pay

## 2018-01-01 ENCOUNTER — Encounter: Payer: Self-pay | Admitting: Physician Assistant

## 2018-01-01 ENCOUNTER — Other Ambulatory Visit: Payer: Self-pay | Admitting: Family Medicine

## 2018-01-01 ENCOUNTER — Ambulatory Visit (INDEPENDENT_AMBULATORY_CARE_PROVIDER_SITE_OTHER): Payer: BC Managed Care – PPO | Admitting: Physician Assistant

## 2018-01-01 ENCOUNTER — Ambulatory Visit (INDEPENDENT_AMBULATORY_CARE_PROVIDER_SITE_OTHER): Payer: BC Managed Care – PPO

## 2018-01-01 VITALS — BP 138/72 | HR 64 | Temp 97.7°F | Ht 63.0 in | Wt 347.0 lb

## 2018-01-01 VITALS — BP 138/72 | HR 64 | Temp 97.7°F | Resp 17 | Ht 63.0 in | Wt 347.0 lb

## 2018-01-01 DIAGNOSIS — Z1239 Encounter for other screening for malignant neoplasm of breast: Secondary | ICD-10-CM

## 2018-01-01 DIAGNOSIS — L539 Erythematous condition, unspecified: Secondary | ICD-10-CM | POA: Diagnosis not present

## 2018-01-01 DIAGNOSIS — Z1231 Encounter for screening mammogram for malignant neoplasm of breast: Secondary | ICD-10-CM

## 2018-01-01 DIAGNOSIS — I10 Essential (primary) hypertension: Secondary | ICD-10-CM | POA: Diagnosis not present

## 2018-01-01 DIAGNOSIS — Z0000 Encounter for general adult medical examination without abnormal findings: Secondary | ICD-10-CM

## 2018-01-01 DIAGNOSIS — I890 Lymphedema, not elsewhere classified: Secondary | ICD-10-CM

## 2018-01-01 DIAGNOSIS — L989 Disorder of the skin and subcutaneous tissue, unspecified: Secondary | ICD-10-CM

## 2018-01-01 MED ORDER — DOXYCYCLINE HYCLATE 100 MG PO TABS: 100.0000 mg | ORAL_TABLET | Freq: Two times a day (BID) | ORAL | 0 refills | Status: AC

## 2018-01-01 MED ORDER — DOXYCYCLINE HYCLATE 100 MG PO TABS: 100.0000 mg | ORAL_TABLET | Freq: Two times a day (BID) | ORAL | 0 refills | Status: DC

## 2018-01-01 NOTE — Telephone Encounter (Signed)
Kenalog cream refill Last OV:01/01/18 Last refill:11/15/16 (expired) ZOX:[id number]:[id number] - [place], [place] - [address] [facility] [address] [phone] (Phone) [phone] (Fax)

## 2018-01-01 NOTE — Patient Instructions (Addendum)
[name] , Thank you for taking time to come for your Medicare Wellness Visit. I appreciate your ongoing commitment to your health goals. Please review the following plan we discussed and let me know if I can assist you in the future.   Screening recommendations/referrals: Colonoscopy: no longer required Mammogram: no longer required, Please call [phone] to schedule your mammogram.  Bone Density: completed 11/29/2008 Recommended yearly ophthalmology/optometry visit for glaucoma screening and checkup Recommended yearly dental visit for hygiene and checkup  Vaccinations: Influenza vaccine: due 01/2018 Pneumococcal vaccine: completed series Tdap vaccine: up to date Shingles vaccine: shingrix eligible, check with your insurance company for coverage     Advanced directives: Advance directive discussed with you today. I have provided a copy for you to complete at home and have notarized. Once this is complete please bring a copy in to our office so we can scan it into your chart.   Conditions/risks identified: Recommend drinking at least 6-8 glasses of water a day  Next appointment: Follow up in one year for your annual wellness exam.    Preventive Care 65 Years and Older, Female Preventive care refers to lifestyle choices and visits with your health care provider that can promote health and wellness. What does preventive care include?  A yearly physical exam. This is also called an annual well check.  Dental exams once or twice a year.  Routine eye exams. Ask your health care provider how often you should have your eyes checked.  Personal lifestyle choices, including:  Daily care of your teeth and gums.  Regular physical activity.  Eating a healthy diet.  Avoiding tobacco and drug use.  Limiting alcohol use.  Practicing safe sex.  Taking low-dose aspirin every day.  Taking vitamin and mineral supplements as recommended by your health care provider. What happens during  an annual well check? The services and screenings done by your health care provider during your annual well check will depend on your age, overall health, lifestyle risk factors, and family history of disease. Counseling  Your health care provider may ask you questions about your:  Alcohol use.  Tobacco use.  Drug use.  Emotional well-being.  Home and relationship well-being.  Sexual activity.  Eating habits.  History of falls.  Memory and ability to understand (cognition).  Work and work Astronomerenvironment.  Reproductive health. Screening  You may have the following tests or measurements:  Height, weight, and BMI.  Blood pressure.  Lipid and cholesterol levels. These may be checked every 5 years, or more frequently if you are over 77 years old.  Skin check.  Lung cancer screening. You may have this screening every year starting at age 77 if you have a 30-pack-year history of smoking and currently smoke or have quit within the past 15 years.  Fecal occult blood test (FOBT) of the stool. You may have this test every year starting at age 77.  Flexible sigmoidoscopy or colonoscopy. You may have a sigmoidoscopy every 5 years or a colonoscopy every 10 years starting at age 77.  Hepatitis C blood test.  Hepatitis B blood test.  Sexually transmitted disease (STD) testing.  Diabetes screening. This is done by checking your blood sugar (glucose) after you have not eaten for a while (fasting). You may have this done every 1-3 years.  Bone density scan. This is done to screen for osteoporosis. You may have this done starting at age 77.  Mammogram. This may be done every 1-2 years. Talk to your health  care provider about how often you should have regular mammograms. Talk with your health care provider about your test results, treatment options, and if necessary, the need for more tests. Vaccines  Your health care provider may recommend certain vaccines, such as:  Influenza  vaccine. This is recommended every year.  Tetanus, diphtheria, and acellular pertussis (Tdap, Td) vaccine. You may need a Td booster every 10 years.  Zoster vaccine. You may need this after age 60.  Pneumococcal 13-valent conjugate (PCV13) vaccine. One dose is recommended after age 61.  Pneumococcal polysaccharide (PPSV23) vaccine. One dose is recommended after age 15. Talk to your health care provider about which screenings and vaccines you need and how often you need them. This information is not intended to replace advice given to you by your health care provider. Make sure you discuss any questions you have with your health care provider. Document Released: 06/02/2015 Document Revised: 01/24/2016 Document Reviewed: 03/07/2015 Elsevier Interactive Patient Education  2017 [place] Prevention in the Home Falls can cause injuries. They can happen to people of all ages. There are many things you can do to make your home safe and to help prevent falls. What can I do on the outside of my home?  Regularly fix the edges of walkways and driveways and fix any cracks.  Remove anything that might make you trip as you walk through a door, such as a raised step or threshold.  Trim any bushes or trees on the path to your home.  Use bright outdoor lighting.  Clear any walking paths of anything that might make someone trip, such as rocks or tools.  Regularly check to see if handrails are loose or broken. Make sure that both sides of any steps have handrails.  Any raised decks and porches should have guardrails on the edges.  Have any leaves, snow, or ice cleared regularly.  Use sand or salt on walking paths during winter.  Clean up any spills in your garage right away. This includes oil or grease spills. What can I do in the bathroom?  Use night lights.  Install grab bars by the toilet and in the tub and shower. Do not use towel bars as grab bars.  Use non-skid mats or decals  in the tub or shower.  If you need to sit down in the shower, use a plastic, non-slip stool.  Keep the floor dry. Clean up any water that spills on the floor as soon as it happens.  Remove soap buildup in the tub or shower regularly.  Attach bath mats securely with double-sided non-slip rug tape.  Do not have throw rugs and other things on the floor that can make you trip. What can I do in the bedroom?  Use night lights.  Make sure that you have a light by your bed that is easy to reach.  Do not use any sheets or blankets that are too big for your bed. They should not hang down onto the floor.  Have a firm chair that has side arms. You can use this for support while you get dressed.  Do not have throw rugs and other things on the floor that can make you trip. What can I do in the kitchen?  Clean up any spills right away.  Avoid walking on wet floors.  Keep items that you use a lot in easy-to-reach places.  If you need to reach something above you, use a strong step stool that has a grab  bar.  Keep electrical cords out of the way.  Do not use floor polish or wax that makes floors slippery. If you must use wax, use non-skid floor wax.  Do not have throw rugs and other things on the floor that can make you trip. What can I do with my stairs?  Do not leave any items on the stairs.  Make sure that there are handrails on both sides of the stairs and use them. Fix handrails that are broken or loose. Make sure that handrails are as long as the stairways.  Check any carpeting to make sure that it is firmly attached to the stairs. Fix any carpet that is loose or worn.  Avoid having throw rugs at the top or bottom of the stairs. If you do have throw rugs, attach them to the floor with carpet tape.  Make sure that you have a light switch at the top of the stairs and the bottom of the stairs. If you do not have them, ask someone to add them for you. What else can I do to help  prevent falls?  Wear shoes that:  Do not have high heels.  Have rubber bottoms.  Are comfortable and fit you well.  Are closed at the toe. Do not wear sandals.  If you use a stepladder:  Make sure that it is fully opened. Do not climb a closed stepladder.  Make sure that both sides of the stepladder are locked into place.  Ask someone to hold it for you, if possible.  Clearly mark and make sure that you can see:  Any grab bars or handrails.  First and last steps.  Where the edge of each step is.  Use tools that help you move around (mobility aids) if they are needed. These include:  Canes.  Walkers.  Scooters.  Crutches.  Turn on the lights when you go into a dark area. Replace any light bulbs as soon as they burn out.  Set up your furniture so you have a clear path. Avoid moving your furniture around.  If any of your floors are uneven, fix them.  If there are any pets around you, be aware of where they are.  Review your medicines with your doctor. Some medicines can make you feel dizzy. This can increase your chance of falling. Ask your doctor what other things that you can do to help prevent falls. This information is not intended to replace advice given to you by your health care provider. Make sure you discuss any questions you have with your health care provider. Document Released: 03/02/2009 Document Revised: 10/12/2015 Document Reviewed: 06/10/2014 Elsevier Interactive Patient Education  2017 [employer].

## 2018-01-01 NOTE — Telephone Encounter (Signed)
Last Refill 11/15/2016  Patient was seen today 01/01/2018 for HTN and Redness of Skin. Please advise.

## 2018-01-01 NOTE — Telephone Encounter (Signed)
mupirocin refill Last Refill:04/04/15 # 15g Last OV: 04/04/15 PCP: Osvaldo AngstAdriana Pollak PA Pharmacy:[facility] [phone]

## 2018-01-01 NOTE — Progress Notes (Signed)
Subjective:   [name] is a 77 y.o. female who presents for Medicare Annual (Subsequent) preventive examination.  Review of Systems:   Cardiac Risk Factors include: advanced age (>5755men, 36>65 women);obesity (BMI >30kg/m2);hypertension;dyslipidemia     Objective:     Vitals: BP 138/72 (BP Location: Right Wrist, Patient Position: Sitting)   Pulse 64   Temp 97.7 F (36.5 C) (Temporal)   Resp 17   Ht 5\' 3"  (1.6 m)   Wt (!) 347 lb (157.4 kg)   LMP  (LMP Unknown)   SpO2 96%   BMI 61.47 kg/m   Body mass index is 61.47 kg/m.  Advanced Directives 01/01/2018 12/04/2016 11/08/2016  Does Patient Have a Medical Advance Directive? No No No  Would patient like information on creating a medical advance directive? Yes (MAU/Ambulatory/Procedural Areas - Information given) No - Patient declined Yes (MAU/Ambulatory/Procedural Areas - Information given)    Tobacco Social History   Tobacco Use  Smoking Status Never Smoker  Smokeless Tobacco Never Used     Counseling given: Not Answered   Clinical Intake:  Pre-visit preparation completed: Yes  Pain : No/denies pain     Nutritional Status: BMI > 30  Obese Nutritional Risks: None Diabetes: No  How often do you need to have someone help you when you read instructions, pamphlets, or other written materials from your doctor or pharmacy?: 1 - Never What is the last grade level you completed in school?: 14 years  Interpreter Needed?: No  Information entered by :: Tiffany Hill,LPN   Past Medical History:  Diagnosis Date  . Allergy   . Arthritis   . Asthma   . Chronic fatigue syndrome   . Fractured fibula   . GERD (gastroesophageal reflux disease)   . Hypertension   . Lymphedema 2004  . Neuromuscular disorder (HCC)   . Pneumonia    Past Surgical History:  Procedure Laterality Date  . ABDOMINAL HYSTERECTOMY  1974  . BIOPSY THYROID    . CHOLECYSTECTOMY  2005   Family History  Problem Relation Age of Onset  . Heart  disease Mother   . Hyperlipidemia Mother   . Diabetes Father   . Alcoholism Father   . Cancer Brother   . Hypertension Son   . Stroke Maternal Grandmother    Social History   Socioeconomic History  . Marital status: Widowed    Spouse name: Not on file  . Number of children: Not on file  . Years of education: 14 years  . Highest education level: Some college, no degree  Occupational History  . Not on file  Social Needs  . Financial resource strain: Not hard at all  . Food insecurity:    Worry: Never true    Inability: Never true  . Transportation needs:    Medical: No    Non-medical: No  Tobacco Use  . Smoking status: Never Smoker  . Smokeless tobacco: Never Used  Substance and Sexual Activity  . Alcohol use: No    Alcohol/week: 0.0 standard drinks  . Drug use: No  . Sexual activity: Yes  Lifestyle  . Physical activity:    Days per week: 0 days    Minutes per session: 0 min  . Stress: Not at all  Relationships  . Social connections:    Talks on phone: More than three times a week    Gets together: More than three times a week    Attends religious service: 1 to 4 times per year  Active member of club or organization: Yes    Attends meetings of clubs or organizations: More than 4 times per year    Relationship status: Widowed  Other Topics Concern  . Not on file  Social History Narrative   Card making club.       Working full time at [employer]     Outpatient Encounter Medications as of 01/01/2018  Medication Sig  . albuterol (PROVENTIL) (5 MG/ML) 0.5% nebulizer solution Take 0.5 mLs (2.5 mg total) by nebulization every 6 (six) hours as needed for wheezing or shortness of breath.  . Apple Cider Vinegar 500 MG TABS Take 500 mg by mouth 4 (four) times daily.  Marland Kitchen aspirin 81 MG tablet Take 81 mg by mouth daily.  Marland Kitchen atorvastatin (LIPITOR) 10 MG tablet Take 1 tablet (10 mg total) by mouth daily.  Marland Kitchen buPROPion (WELLBUTRIN SR) 150 MG 12 hr tablet TAKE  1 TABLET BY MOUTH EVERY 12 HOURS  . Cholecalciferol (VITAMIN D-3 PO) Take by mouth daily.  . Fluticasone-Salmeterol (ADVAIR DISKUS) 250-50 MCG/DOSE AEPB Inhale 1 puff into the lungs 2 (two) times daily.  . Fluticasone-Salmeterol (WIXELA INHUB) 250-50 MCG/DOSE AEPB Inhale 1 puff into the lungs 2 (two) times daily.  . furosemide (LASIX) 40 MG tablet Take 1 tablet (40 mg total) by mouth daily as needed.  . Glucos-Chond-Hyal Ac-Ca Fructo (MOVE FREE JOINT HEALTH ADVANCE PO) Take by mouth daily.  . irbesartan (AVAPRO) 300 MG tablet Take 0.5 tablets (150 mg total) by mouth daily.  . magnesium oxide (MAG-OX) 400 MG tablet Take 400 mg by mouth daily.  . mupirocin cream (BACTROBAN) 2 % APPLY EXTERNALLY TO THE AFFECTED AREA TWICE DAILY  . nebivolol (BYSTOLIC) 5 MG tablet Take 0.5 tablets (2.5 mg total) by mouth 2 (two) times daily.  . OIL OF OREGANO PO Take 40 mg by mouth daily.  Marland Kitchen omeprazole (PRILOSEC) 20 MG capsule TAKE 1 CAPSULE(20 MG) BY MOUTH DAILY  . Tiotropium Bromide Monohydrate (SPIRIVA RESPIMAT) 1.25 MCG/ACT AERS Inhale 2 puffs into the lungs daily.  Marland Kitchen triamcinolone cream (KENALOG) 0.1 % APPLY TOPICALLY TO THE AFFECTED AREA TWICE DAILY  . Turmeric 500 MG CAPS Take by mouth.  . vitamin E 400 UNIT capsule Take 400 Units by mouth daily.  Marland Kitchen VITAMINS-LIPOTROPICS PO Take by mouth.  Marland Kitchen albuterol (PROVENTIL HFA;VENTOLIN HFA) 108 (90 Base) MCG/ACT inhaler Inhale 2 puffs into the lungs every 6 (six) hours as needed for wheezing or shortness of breath. (Patient not taking: Reported on 01/01/2018)  . chlorpheniramine-HYDROcodone (TUSSIONEX PENNKINETIC ER) 10-8 MG/5ML SUER Take 5 mLs by mouth every 12 (twelve) hours as needed for cough. (Patient not taking: Reported on 01/01/2018)  . chlorpheniramine-HYDROcodone (TUSSIONEX PENNKINETIC ER) 10-8 MG/5ML SUER Take 5 mLs by mouth every 12 (twelve) hours as needed for cough. (Patient not taking: Reported on 01/01/2018)  . LORazepam (ATIVAN) 1 MG tablet Take 1 tablet (1  mg total) by mouth daily as needed for anxiety. 1/2-1 tab (Patient not taking: Reported on 01/01/2018)  . Silver-Carboxymethylcellulose (AQUACEL-AG EXTRA HYDROFIBER) 2"X2" PADS Use daily prn (Patient not taking: Reported on 01/01/2018)   Facility-Administered Encounter Medications as of 01/01/2018  Medication  . chlorpheniramine-HYDROcodone (TUSSIONEX) 10-8 MG/5ML suspension 5 mL    Activities of Daily Living In your present state of health, do you have any difficulty performing the following activities: 01/01/2018  Hearing? N  Vision? Y  Comment cataract left eye   Difficulty concentrating or making decisions? N  Walking or climbing stairs? Jeannie Fend  Comment SOB   Dressing or bathing? N  Doing errands, shopping? N  Preparing Food and eating ? N  Using the Toilet? N  In the past six months, have you accidently leaked urine? N  Do you have problems with loss of bowel control? N  Managing your Medications? N  Managing your Finances? N  Housekeeping or managing your Housekeeping? N  Some recent data might be hidden    Patient Care Team: Gabriel Cirri, NP as PCP - General (Nurse Practitioner) Erin Fulling, MD as Consulting Physician (Pulmonary Disease) Antonieta Iba, MD as Consulting Physician (Cardiology)    Assessment:   This is a routine wellness examination for [name].  Exercise Activities and Dietary recommendations Current Exercise Habits: The patient does not participate in regular exercise at present, Exercise limited by: None identified  Goals    . Increase water intake     Recommend drinking at least 6-8 glasses of water a day       Fall Risk Fall Risk  01/01/2018 07/23/2017 05/16/2017 02/12/2017 11/08/2016  Falls in the past year? No No No No No   Is the patient's home free of loose throw rugs in walkways, pet beds, electrical cords, etc?   yes      Grab bars in the bathroom? yes      Handrails on the stairs?   yes, also has ramp in front of house       Adequate  lighting?   yes  Timed Get Up and Go performed: Completed in 12 seconds with use of assistive devices- cane, steady gait. No intervention needed at this time.   Depression Screen PHQ 2/9 Scores 01/01/2018 12/05/2017 05/16/2017 02/12/2017  PHQ - 2 Score 0 0 0 0  PHQ- 9 Score 1 0 1 0     Cognitive Function     6CIT Screen 01/01/2018 11/08/2016  What Year? 0 points 0 points  What month? 0 points 0 points  What time? 0 points 0 points  Count back from 20 0 points 0 points  Months in reverse 0 points 0 points  Repeat phrase 2 points 0 points  Total Score 2 0    Immunization History  Administered Date(s) Administered  . Influenza, High Dose Seasonal PF 02/21/2016, 02/12/2017  . Influenza,inj,Quad PF,6+ Mos 02/22/2015  . Influenza-Unspecified 02/16/2014  . Pneumococcal Conjugate-13 08/31/2013  . Td 08/31/2013  . Zoster 11/12/2011    Qualifies for Shingles Vaccine? Yes, discussed shingrix vaccine   Screening Tests Health Maintenance  Topic Date Due  . INFLUENZA VACCINE  12/18/2017  . TETANUS/TDAP  09/01/2023  . DEXA SCAN  Completed  . PNA vac Low Risk Adult  Completed    Cancer Screenings: Lung: Low Dose CT Chest recommended if Age 42-80 years, 30 pack-year currently smoking OR have quit w/in 15years. Patient does not qualify. Breast:  Up to date on Mammogram? Yes   Up to date of Bone Density/Dexa? Yes  Completed 11/29/2008 Colorectal: no longer required   Additional Screenings:  Hepatitis C Screening: not indicated      Plan:    I have personally reviewed and addressed the Medicare Annual Wellness questionnaire and have noted the following in the patient's chart:  A. Medical and social history B. Use of alcohol, tobacco or illicit drugs  C. Current medications and supplements D. Functional ability and status E.  Nutritional status F.  Physical activity G. Advance directives H. List of other physicians I.  Hospitalizations, surgeries, and ER visits in previous  12  months J.  Vitals K. Screenings such as hearing and vision if needed, cognitive and depression L. Referrals and appointments   In addition, I have reviewed and discussed with patient certain preventive protocols, quality metrics, and best practice recommendations. A written personalized care plan for preventive services as well as general preventive health recommendations were provided to patient.   Signed,  Marin Robertsiffany Hill, LPN Nurse Health Advisor   Nurse Notes:none

## 2018-01-02 ENCOUNTER — Encounter: Payer: Self-pay | Admitting: Internal Medicine

## 2018-01-02 ENCOUNTER — Ambulatory Visit (INDEPENDENT_AMBULATORY_CARE_PROVIDER_SITE_OTHER): Payer: BC Managed Care – PPO | Admitting: Internal Medicine

## 2018-01-02 VITALS — BP 108/68 | HR 66 | Ht 63.0 in | Wt 355.0 lb

## 2018-01-02 DIAGNOSIS — G4733 Obstructive sleep apnea (adult) (pediatric): Secondary | ICD-10-CM

## 2018-01-02 DIAGNOSIS — J4520 Mild intermittent asthma, uncomplicated: Secondary | ICD-10-CM

## 2018-01-02 NOTE — Patient Instructions (Signed)
Continue CPAP as prescribed °Continue inhalers as prescribed ° ° ° °

## 2018-01-02 NOTE — Progress Notes (Signed)
Name: [name] MRN: [id number] DOB: 10/27/1940     CONSULTATION DATE: 3.26.19 REFERRING MD : Jenne CampusmcQueen  CHIEF COMPLAINT: cough and abnormal CXR  STUDIES:  CXR independently reviewed 3.6.19   +left lung opacity noted Since Nov 2018  Ct chest 4.24.19 I have Independently reviewed images of  CT chest   Interpretation:Left mid lung opacity mild, unchanged on CXR for last 2 years  SLeep Study 4.2019 AHi 16, AHi 42 with REM sleep  PFT's 4.23.19 No obvious signs of obstructive lung disease No restrictive lung disease   ALL test results discussed with patient and family  CC Follow up SOB and OSA  HISTORY OF PRESENT ILLNESS: SOB and DOE improved Less wheezing    Patient states she had an upper respiratory tract infection in November 2018 Previous CT chest follow up does NOT show any significant lung findings  Patient has been on Advair for the last several years Recently started on Spiriva Patient also uses nebulized albuterol therapy as needed Patient still with mild  persistent cough and intermittent wheezing  Patient is a non-smoker however was exposed to secondhand smoke exposure from her husband for approximately 60 years 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 with cardiology for cardiac issues  Sleep Study confirms severe sleep apnea    REVIEW OF SYSTEMS:   Constitutional: Negative for fever, chills, weight loss, malaise/fatigue and diaphoresis.  HENT: Negative for hearing loss, ear pain, nosebleeds, congestion, sore throat, neck pain, tinnitus and ear discharge.   Eyes: Negative for blurred vision, double vision, photophobia, pain, discharge and redness.  Respiratory: +cough, -hemoptysis, -sputum production, +shortness of breath,- wheezing and -stridor.   Cardiovascular: Negative for chest pain, palpitations, orthopnea, claudication, leg swelling and PND.  ALL OTHER ROS ARE NEGATIVE  LMP  (LMP  Unknown)  BP 108/68 (BP Location: Left Arm, Cuff Size: Normal)   Pulse 66   Ht 5\' 3"  (1.6 m)   Wt (!) 355 lb (161 kg)   LMP  (LMP Unknown)   SpO2 96%   BMI 62.89 kg/m   Physical Examination:   GENERAL:NAD, no fevers, chills, no weakness no fatigue HEAD: Normocephalic, atraumatic.  EYES: Pupils equal, round, reactive to light. Extraocular muscles intact. No scleral icterus.  MOUTH: Moist mucosal membrane.   EAR, NOSE, THROAT: Clear without exudates. No external lesions.  NECK: Supple. No thyromegaly. No nodules. No JVD.  PULMONARY:CTA B/L no wheezes, no crackles, no rhonchi CARDIOVASCULAR: S1 and S2. Regular rate and rhythm. No murmurs, rubs, or gallops. No edema.  GASTROINTESTINAL: Soft, nontender, nondistended. No masses. Positive bowel sounds.  MUSCULOSKELETAL: +edema-chronic. Range of motion full in all extremities.  NEUROLOGIC: Cranial nerves II through XII are intact. No gross focal neurological deficits.     ASSESSMENT / PLAN: 77 year old pleasant white female with morbid obesity with chronic lymphedema presents today for persistent cough and wheezing with a history of upper respiratory tract infections and in the setting of secondhand smoke exposure.  These findings suggest  underlying reactive airways disease with severe morbid obesity  also has signs and symptoms with snoring and excessive daytime sleepiness and shortness of breath at night with DX of OSA  I have reviewed her chest x-rays personally and there is a persistent left midlung opacification noted +scar tissue-no significant changes in last 2 years  At this time I would recommend several options and therapies for her  #1 continue Advair as prescribed-tolerating well and  rinsing mouth  #2 continue Spiriva Respimat therapy Tolerating well  #3 albuterol nebulizers and MDIs as needed  #4 +OSa with AHI 16 and AHi 42 in REM sleep Compliance reports show 100% compliance 30 days 97%>4 hrs AHI down to 2.2 CPAP  nasal mask 5-15 cm h20 She is using and benefiting from CPAP therapy  #5 cough-Tussionex as needed    Patient/Family are satisfied with Plan of action and management. All questions answered Follow in 6 months  [name] Gladavid Jacquelynn Friend, M.D.  [facility]  Medical Director [facility] Medical Director [facility]

## 2018-01-05 ENCOUNTER — Other Ambulatory Visit: Payer: Self-pay | Admitting: Unknown Physician Specialty

## 2018-01-14 ENCOUNTER — Ambulatory Visit: Payer: BC Managed Care – PPO | Attending: Unknown Physician Specialty | Admitting: Physical Therapy

## 2018-01-14 ENCOUNTER — Encounter: Payer: Self-pay | Admitting: Physical Therapy

## 2018-01-14 DIAGNOSIS — M6281 Muscle weakness (generalized): Secondary | ICD-10-CM | POA: Diagnosis not present

## 2018-01-14 NOTE — Therapy (Addendum)
[place] MAIN [facility] SERVICES [address] [place], [place], [address] Phone: [phone]   Fax:  [phone]  Physical Therapy Treatment  Patient Details  Name: [name] MRN: [id number] Date of Birth: 09/12/1940 No data recorded  Encounter Date: 01/14/2018    Past Medical History:  Diagnosis Date  . Allergy   . Arthritis   . Asthma   . Chronic fatigue syndrome   . Fractured fibula   . GERD (gastroesophageal reflux disease)   . Hypertension   . Lymphedema 2004  . Neuromuscular disorder ([place])   . Pneumonia     Past Surgical History:  Procedure Laterality Date  . ABDOMINAL HYSTERECTOMY  1974  . BIOPSY THYROID    . CHOLECYSTECTOMY  2005    There were no vitals filed for this visit.  Subjective Assessment - 01/14/18 1655    Subjective  Patient is needing a hover round due to not being able to walk more than 2 steps.     Pertinent History  Patient has lymphedemia , she is wearing the garments and uses a machine to assist with swelling. She has had gallbladder surgery and  partial hysterectomy.     Limitations  Walking    Currently in Pain?  Yes    Pain Location  Leg    Pain Orientation  Right;Left    Pain Descriptors / Indicators  --   pinching   Pain Type  Chronic pain    Pain Onset  More than a month ago    Pain Frequency  Intermittent    Aggravating Factors   lying down and when her feet are elevated       PATIENT INFORMATION: This Evaluation form will serve as the LMN for the following suppliers:  Supplier: Contact Person: Phone:   Reason for Referral: Patient needs a hoverround Patient/caregiver Goals:to be able to get around in her home Patient was seen for face-to-face evaluation for new seating system modification.  Patient appears to qualify for power mobility device at this time per objective findings.   MEDICAL HISTORY: Obesity, c-pap, arthritis , GERD, neuromuscular disorder, chronic fatigue  symdrome Diagnosis:lymphedema  Diagnosis Onset:  2004 [x] Progressive Disease Relevant Past and Future Surgeries: Height: 5 feet 3 inches Weight: 350 lbs Explain and recent changes or trends in weight:   Relevant History including falls: No       HOME ENVIRONMENT: [x] House  [] Condo/town home  [] Apartment  [] Assisted Living    [] Lives Alone [x]  Lives with Others                                                    Hours with caregiver:   [] Home is accessible to patient            Stairs  [] Yes [x]  No     Ramp [x] Yes [] No Comments:     COMMUNITY ADL: TRANSPORTATION: [] Car    [] Van    [] Public Transportation    [x] Adapted w/c Lift   [] Ambulance   [] Other:       [] Sits in wheelchair during transport  Employment/School:    Elementary school  Specific requirements pertaining to mobility  Other:                                       FUNCTIONAL/SENSORY PROCESSING SKILLS:  Handedness:   [x] Right     [] Left    [] NA  Comments:                                 Functional Processing Skills for Wheeled Mobility [x] Processing Skills are adequate for safe wheelchair operation  Areas of concern than may interfere with safe operation of wheelchair Description of problem   []  Attention to environment     [] Judgment     []  Hearing  []  Vision or visual processing    [] Motor Planning  []  Fluctuations in Behavior                                                   VERBAL COMMUNICATION: [x] WFL receptive [x]  WFL expressive [x] Understandable  [] Difficult to understand  [] non-communicative []  Uses an augmented communication device    CURRENT SEATING / MOBILITY: Current Mobility Base:   [] None  [] Dependent  [] Manual  [] Scooter  [] Power  Renting a hoverround Type of Control:                       Manufacturer:  hoverround                       Size:                         Age:                           Current Condition of  Mobility Base:    Renting her current hoverround                                                                                                                 Current Wheelchair components:  Describe posture in present seating system:        WFL                                                                   SENSATION and SKIN ISSUES: Sensation [x] Intact [] Impaired [] Absent   Level of sensation:                           Pressure Relief: Able to perform effective pressure relief :   [x] Yes  []  No Method:      Shifts her body                                                                        If not, Why?:                                                                          Skin Issues/Skin Integrity Current Skin Issues   [] Yes [x] No  [x] Intact []  Red area []  Open Area  [] Scar Tissue [] At risk from prolonged sitting  Where                              History of Skin Issues   [x] Yes [] No  Where   BLE                                       When     Cellulitis on LE's august 1/19                                          Hx of skin flap surgeries [] Yes [x] No  Where                  When                                                  Limited sitting tolerance [] Yes [x] No Hours spent sitting in wheelchair daily:             10 hours                                            Complaint of Pain:  Please describe:  No reports of pain                         Swelling/Edema:                                                                                                                     BLE swelling form lymphedema                              ADL STATUS (in reference to wheelchair use):  Indep Assist Unable Indep with Equip Not assessed Comments  Dressing                                         x                                 Eating      x                                                                                                                        Toileting      x                                                                                                                         Bathing                                                 x  Grooming/ Hygiene      x                                                                                                                        Meal Prep                                         x                                                                                 IADLS                                      x                                                                            Bowel Management: [x] Continent  [] Incontinent  [] Accidents Comments:                                                  Bladder Management: [x] Continent  [] Incontinent  [] Accidents Comments:                                              WHEELCHAIR SKILLS: Manual w/c Propulsion: [] UE or LE strength and endurance sufficient to participate in ADLs using manual wheelchair Arm :  [] left [] right  [] Both                                   Foot:   [] left [] right  [] Both  Distance:   Operate Scooter: [x]  Strength, hand grip, balance and transfer appropriate for use [x] Living environment is accessible for use of scooter  Operate Power w/c:  []  Std. Joystick   []  Alternative Controls Indep []  Assist []  Dependent/ Unable []  N/A []  [] Safe          []  Functional  Distance:                Bed confined without wheelchair []  Yes []  No   STRENGTH/RANGE OF MOTION:  Range of Motion Strength  Shoulder                 WFL                                      4/5                                                      Elbow                 WNL                                                                                              4/5  Wrist/Hand                                                            WNL                                                                   4/5          Hip          2/5                                                                                                                2/5   Knee 3/5                                                              3/5     Ankle 3/5  3/5      MOBILITY/BALANCE:  []  Patient is totally dependent for mobility                                                                                               Balance Transfers Ambulation  Sitting Balance: Standing Balance: [x]  Independent []  Independent/Modified Independent  [x]  WFL     []  [facility] []  Supervision []  Supervision  []  Uses UE for balance  []  Supervision []  Min Assist []  Ambulates with Assist                           []  Min Assist [x]  Min assist []  Mod Assist []  Ambulates with Device:  []  RW   []  StW   []  Cane   []                 []  Mod Assist []  Mod assist []  Max assist   []  Max Assist []  Max assist []  Dependent []  Indep. Short Distance Only  []  Unable []  Unable []  Lift / Sling Required Distance (in feet)                             []  Sliding board [x]  Unable to Ambulate: (Explain:Patient is not able to move her LE's to take a step due to weakness . She has not been able to walk since 2011.  Cardio Status:  [x] Intact  []  Impaired   []  NA                              Respiratory Status:  [x] Intact   [] Impaired   [] NA                                     Orthotics/Prosthetics:  NA                                                                       Comments (Address manual vs power w/c vs scooter):    Patient is obese and unable to push herself in a manual wc ; She is unable to safely step/transfer into a scooter and would benefit from a power  wheelchair to assist with mobility in the home. Also a scooter would not allow for home ADLs such as eating, preparing a meal, getting in the bathroom as it does not turn well and will not fit under a table for meals.  Anterior / Posterior Obliquity Rotation-Pelvis  PELVIS    [] Neutral  []  Posterior  []  Anterior     [] WFL  [] Right Elevated  [] Left Elevated   [] WFL  [] Right Anterior []   Left Anterior    []  Fixed []  Partly Flexible []  Flexible  []  Other  []  Fixed  []  Partly Flexible  []  Flexible []  Other  []  Fixed  []  Partly Flexible  []  Flexible []  Other  TRUNK [] WFL [] Thoracic Kyphosis [] Lumbar Lordosis   []  WFL [] Convex Right [] Convex Left   [] c-curve [] s-curve [] multiple  []  Neutral []  Left-anterior []  Right-anterior    []  Fixed []  Flexible []  Partly Flexible       Other  []  Fixed []  Flexible []  Partly Flexible []  Other  []  Fixed           []  Flexible []  Partly Flexible []  Other   Position Windswept   HIPS  []  Neutral []  Abduct []  ADduct []  Neutral []  Right []  Left       []  Fixed  []  Partly Flexible             []  Dislocated []  Flexible []  Subluxed    []  Fixed []  Partly Flexible  []  Flexible []  Other              Foot Positioning Knee Positioning   Knees and  Feet  []  WFL [] Left [] Right []  WFL [] Left [] Right   KNEES ROM concerns: ROM concerns:   & Dorsi-Flexed                    [] Lt [] Rt                                  FEET Plantar Flexed                  [] Lt [] Rt     Inversion                    [] Lt [] Rt     Eversion                    [] Lt [] Rt    HEAD []  Functional []  Good Head Control   & []  Flexed         []  Extended []  Adequate Head Control   NECK []  Rotated  Lt  []  Lat Flexed Lt []  Rotated  Rt []  Lat Flexed Rt []  Limited Head Control    []  Cervical Hyperextension []  Absent  Head Control    SHOULDERS ELBOWS WRIST& HAND         Left      Right    Left     Right  U/E [] Functional  Left            [] Functional  Right                                 [] Fisting             [] Fisting     [] elevated Left [] depressed  Left [] elevated Right [] depressed  Right      [] protracted Left [] retracted Left [] protracted Right [] retracted Right [] subluxed  Left              [] subluxed  Right         Goals for Wheelchair  Mobility  [x]  Independence with mobility in the home with motor related ADLs (MRADLs)  [x]  Independence with MRADLs in the community [x]  Provide dependent mobility  []  Provide recline     [] Provide tilt   Goals for Seating system [x]  Optimize pressure distribution [x]  Provide support needed to facilitate function or safety [x]  Provide corrective forces to assist with maintaining or improving posture []  Accommodate client's posture: current seated postures and positions are not flexible or will not tolerate corrective forces []  Client to be independent with relieving pressure in the wheelchair [] Enhance physiological function such as breathing, swallowing, digestion  Simulation ideas/Equipment trials:                                                                                                State why other equipment was unsuccessful:                                                                                 MOBILITY BASE RECOMMENDATIONS and JUSTIFICATION: MOBILITY COMPONENT JUSTIFICATION  Manufacturer:  Hoverround         Model:  Power wheelchair            Size: Width           Seat Depth             [x] provide transport from point A to B [x] promote Indep mobility  [x] is not a safe, functional ambulator [x] walker or cane inadequate [x] non-standard width/depth necessary to accommodate anatomical measurement []                             [] Manual Mobility Base [] non-functional ambulator    [] Scooter/POV  [] can safely operate  [] can safely transfer   [] has adequate trunk stability  [] cannot  functionally propel manual w/c  [x] Power Mobility Base  [x] non-ambulatory  [x] cannot functionally propel manual wheelchair  [x]  cannot functionally and safely operate scooter/POV [x] can safely operate and willing to  [] Stroller Base [] infant/child  [] unable to propel manual wheelchair [] allows for growth [] non-functional ambulator [] non-functional UE [] Indep mobility is not a goal at this time  [] Tilt  [] Forward                   [] Backward                  [] Powered tilt              [] Manual tilt  [] change position against gravitational force on head and shoulders  [] change position for pressure relief/cannot weight shift [] transfers  [] management of tone [] rest periods [] control edema [] facilitate postural control  []                                       []   Recline  [] Power recline on power base [] Manual recline on manual base  [] accommodate femur to back angle  [] bring to full recline for ADL care  [] change position for pressure relief/cannot weight shift [] rest periods [] repositioning for transfers or clothing/diaper /catheter changes [] head positioning  [] Lighter weight required [] self- propulsion  [] lifting []                                                 [x] Heavy Duty required [x] user weight greater than 250# [] extreme tone/ over active movement [] broken frame on previous chair []                                     []  Back  []  Angle Adjustable []  Custom molded                           [] postural control [] control of tone/spasticity [] accommodation of range of motion [] UE functional control [] accommodation for seating system []                                          [] provide lateral trunk support [] accommodate deformity [] provide posterior trunk support [] provide lumbar/sacral support [] support trunk in midline [] Pressure relief over spinal processes  []  Seat Cushion                       [] impaired sensation  [] decubitus ulcers present [] history of pressure  ulceration [] prevent pelvic extension [] low maintenance  [] stabilize pelvis  [] accommodate obliquity [] accommodate multiple deformity [] neutralize lower extremity position [] increase pressure distribution []                                           []  Pelvic/thigh support  []  Lateral thigh guide []  Distal medial pad  []  Distal lateral pad []  pelvis in neutral [] accommodate pelvis []  position upper legs []  alignment []  accommodate ROM []  decrease adduction [] accommodate tone [] removable for transfers [] decrease abduction  []  Lateral trunk Supports []  Lt     []  Rt [] decrease lateral trunk leaning [] control tone [] contour for increased contact [] safety  [] accommodate asymmetry []                                                 []  Mounting hardware  [] lateral trunk supports  [] back   [] seat [] headrest      []  thigh support [] fixed   [] swing away [] attach seat platform/cushion to w/c frame [] attach back cushion to w/c frame [] mount postural supports [] mount headrest  [] swing medial thigh support away [] swing lateral supports away for transfers  []                                                     Armrests  [] fixed [x] adjustable height []   removable   [] swing away  [x] flip back   [] reclining [] full length pads [] desk    [] pads tubular  [x] provide support with elbow at 90   [] provide support for w/c tray [x] change of height/angles for variable activities [x] remove for transfers [x] allow to come closer to table top [] remove for access to tables []                                               Hangers/ Leg rests  [] 60 [] 70 [] 90 [] elevating [] heavy duty  [] articulating [] fixed [] lift off [] swing away     [] power [] provide LE support  [] accommodate to hamstring tightness [] elevate legs during recline   [] provide change in position for Legs [] Maintain placement of feet on footplate [] durability [] enable transfers [] decrease edema [] Accommodate lower leg length []                                          Foot support Footplate    [] Lt  []  Rt  []  Center mount [] flip up                            [] depth/angle adjustable [] Amputee adapter    []  Lt     []  Rt [] provide foot support [] accommodate to ankle ROM [] transfers [] Provide support for residual extremity []  allow foot to go under wheelchair base []  decrease tone  []                                                 []  Ankle strap/heel loops [] support foot on foot support [] decrease extraneous movement [] provide input to heel  [] protect foot  Tires: [] pneumatic  [] flat free inserts  [] solid  [] decrease maintenance  [] prevent frequent flats [] increase shock absorbency [] decrease pain from road shock [] decrease spasms from road shock []                                              []  Headrest  [] provide posterior head support [] provide posterior neck support [] provide lateral head support [] provide anterior head support [] support during tilt and recline [] improve feeding   [] improve respiration [] placement of switches [] safety  [] accommodate ROM  [] accommodate tone [] improve visual orientation  []  Anterior chest strap []  Vest []  Shoulder retractors  [] decrease forward movement of shoulder [] accommodation of TLSO [] decrease forward movement of trunk [] decrease shoulder elevation [] added abdominal support [] alignment [] assistance with shoulder control  []                                               Pelvic Positioner [] Belt [] SubASIS bar [] Dual Pull [] stabilize tone [] decrease falling out of chair/ **will not Decrease potential for sliding due to pelvic tilting [] prevent excessive rotation [] pad for protection over boney prominence [] prominence comfort [] special pull angle to control rotation []   Upper ExtremitySupport  [] L   []  R [] Arm trough   [] hand support []  tray       [] full tray [] swivel mount [] decrease edema      [] decrease  subluxation   [] control tone   [] placement for AAC/Computer/EADL [] decrease gravitational pull on shoulders [] provide midline positioning [] provide support to increase UE function [] provide hand support in natural position [] provide work surface   POWER WHEELCHAIR CONTROLS  [x] Proportional  [] Non-Proportional Type                                      [] Left  [x] Right [x] provides access for controlling wheelchair   [] lacks motor control to operate proportional drive control [] unable to understand proportional controls  Actuator Control Module  [] Single  [] Multiple   [] Allow the client to operate the power seat function(s) through the joystick control   [] Safety Reset Switches [] Used to change modes and stop the wheelchair when driving in latch mode    [] Therapist, art   [] programming for accurate control [] progressive Disease/changing condition [] non-proportional drive control needed [] Needed in order to operate power seat functions through joystick control   [] Display box [] Allows user to see in which mode and drive the wheelchair is set  [] necessary for alternate controls    [] Digital interface electronics [] Allows w/c to operate when using alternative drive controls  [] ASL Head Array [] Allows client to operate wheelchair  through switches placed in tri-panel headrest  [] Sip and puff with tubing kit [] needed to operate sip and puff drive controls  [] Upgraded tracking electronics [] increase safety when driving [] correct tracking when on uneven surfaces  [] Mount for switches or joystick [] Attaches switches to w/c  [] Swing away for access or transfers [] midline for optimal placement [] provides for consistent access  [] Attendant controlled joystick plus mount [] safety [] long distance driving [] operation of seat functions [] compliance with transportation regulations []                                             Rear wheel placement/Axle adjustability [] None [] semi  adjustable [] fully adjustable  [] improved UE access to wheels [] improved stability [] changing angle in space for improvement of postural stability [] 1-arm drive access [] amputee pad placement []                                Wheel rims/ hand rims  [] metal   [] plastic coated [] oblique projections           [] vertical projections [] Provide ability to propel manual wheelchair  []  Increase self-propulsion with hand weakness/decreased grasp  Push handles [] extended   [] angle adjustable              [] standard [] caregiver access [] caregiver assist [] allows "hooking" to enable increased ability to perform ADLs or maintain balance  One armed device   [] Lt   [] Rt [] enable propulsion of manual wheelchair with one arm   []                                            Brake/wheel lock extension []  Lt   []  Rt [] increase indep in applying wheel locks   [] Side guards [] prevent  clothing getting caught in wheel or becoming soiled []  prevent skin tears/abrasions  Battery:               x                             [x] to power wheelchair                                                         Other:                                                                                                                        The above equipment has a life- long use expectancy. Growth and changes in medical and/or functional conditions would be the exceptions. This is to certify that the therapist has no financial relationship with durable medical provider or manufacturer. The therapist will not receive remuneration of any kind for the equipment recommended in this evaluation.   Patient has mobility limitation that significantly impairs safe, timely participation in one or more mobility related ADL's. (bathing, toileting, feeding, dressing, grooming, moving from room to room)  [x]  Yes []  No  Will mobility device sufficiently improve ability to participate and/or be aided in participation of MRADL's?      [x]  Yes []   No  Can limitation be compensated for with use of a cane or walker?                                    []  Yes [x]  No  Does patient or caregiver demonstrate ability/potential ability & willingness to safely use the mobility device?    [x]  Yes []  No  Does patient's home environment support use of recommended mobility device?            [x]  Yes []  No  Does patient have sufficient upper extremity function necessary to functionally propel a manual wheelchair?     []  Yes [x]  No  Does patient have sufficient strength and trunk stability to safely operate a POV (scooter)?                                  []  Yes [x]  No  Does patient need additional features/benefits provided by a power wheelchair for MRADL's in the home?        [x]  Yes []  No  Does the patient demonstrate the ability to safely use a power wheelchair?                   [x]  Yes []  No     Physician's Name Printed:  Dr. Kathrine Haddock                                                  Physician's Signature:  Date:     This is to certify that I, the above signed therapist have the following affiliations: []  This DME provider []  Manufacturer of recommended equipment []  Patient's long term care facility [x]  None of the above  Therapist Name/Signature:      Arelia Sneddon PT , DPT                                      Date:01/14/18                                PT Long Term Goals - 01/14/18 1747      PT LONG TERM GOAL #1   Title  Patient  will understand the requirements of needing a hoverround.    Time  1    Period  Days    Status  Achieved    Target Date  01/14/18            Plan - 01/14/18 1735    History and Personal Factors relevant to plan of care:  Patient presents with for evalaution for scooter. See eval.    Clinical Presentation  Stable    Clinical Presentation due to:  has not ambulated in 8 years    Clinical Decision Making  Low    Rehab Potential  Poor    PT  Frequency  One time visit    Consulted and Agree with Plan of Care  Patient       Patient will benefit from skilled therapeutic intervention in order to improve the following deficits and impairments:  Obesity, Decreased endurance, Decreased mobility, Difficulty walking  Visit Diagnosis: Muscle weakness (generalized)     Problem List Patient Active Problem List   Diagnosis Date Noted  . Thyroid nodule 08/19/2017  . Hyperlipidemia 02/12/2017  . Aortic atherosclerosis ([place]) 08/12/2016  . Skin lesion of right arm 07/10/2016  . Bradycardia 05/24/2016  . Obesity 02/21/2016  . Mobility impaired 11/10/2015  . Depression 11/06/2015  . Seborrheic keratoses 10/11/2015  . Rotator cuff impingement syndrome of right shoulder 10/11/2015  . Cellulitis 09/19/2015  . Asthma 08/23/2015  . Lymphedema 12/05/2014  . Essential hypertension, benign 12/05/2014    Alanson Puls, PT DPT 01/14/2018, 5:49 PM  [place] MAIN [facility] SERVICES [address] [place], [place], [address] Phone: [phone]   Fax:  [phone]  Name: [name] MRN: [id number] Date of Birth: 12/29/1940

## 2018-01-30 ENCOUNTER — Other Ambulatory Visit: Payer: Self-pay | Admitting: Unknown Physician Specialty

## 2018-01-30 NOTE — Telephone Encounter (Signed)
  Omeprazole refill Last Refill:05/23/17  # 90 cap No RF Last OV: no OV noted with the past year PCP: Osvaldo AngstAdriana Pollak, PA Pharmacy:[facility] [address]

## 2018-03-01 ENCOUNTER — Other Ambulatory Visit: Payer: Self-pay | Admitting: Physician Assistant

## 2018-03-02 NOTE — Telephone Encounter (Signed)
Refill sent to pharmacy per patient request.

## 2018-03-09 ENCOUNTER — Ambulatory Visit (INDEPENDENT_AMBULATORY_CARE_PROVIDER_SITE_OTHER): Payer: BC Managed Care – PPO | Admitting: Nurse Practitioner

## 2018-03-09 ENCOUNTER — Encounter: Payer: Self-pay | Admitting: Nurse Practitioner

## 2018-03-09 ENCOUNTER — Other Ambulatory Visit: Payer: Self-pay

## 2018-03-09 VITALS — BP 104/68 | HR 64 | Temp 97.9°F | Ht 63.3 in | Wt 353.0 lb

## 2018-03-09 DIAGNOSIS — F3340 Major depressive disorder, recurrent, in remission, unspecified: Secondary | ICD-10-CM | POA: Diagnosis not present

## 2018-03-09 DIAGNOSIS — K219 Gastro-esophageal reflux disease without esophagitis: Secondary | ICD-10-CM | POA: Insufficient documentation

## 2018-03-09 DIAGNOSIS — E041 Nontoxic single thyroid nodule: Secondary | ICD-10-CM

## 2018-03-09 DIAGNOSIS — E785 Hyperlipidemia, unspecified: Secondary | ICD-10-CM

## 2018-03-09 DIAGNOSIS — I890 Lymphedema, not elsewhere classified: Secondary | ICD-10-CM

## 2018-03-09 DIAGNOSIS — I10 Essential (primary) hypertension: Secondary | ICD-10-CM

## 2018-03-09 DIAGNOSIS — Z0000 Encounter for general adult medical examination without abnormal findings: Secondary | ICD-10-CM | POA: Diagnosis not present

## 2018-03-09 DIAGNOSIS — Z6841 Body Mass Index (BMI) 40.0 and over, adult: Secondary | ICD-10-CM | POA: Diagnosis not present

## 2018-03-09 DIAGNOSIS — E559 Vitamin D deficiency, unspecified: Secondary | ICD-10-CM | POA: Diagnosis not present

## 2018-03-09 NOTE — Assessment & Plan Note (Signed)
Chronic, ongoing.  Continues on daily statin.

## 2018-03-09 NOTE — Progress Notes (Signed)
BP 104/68   Pulse 64   Temp 97.9 F (36.6 C) (Oral)   Ht 5' 3.3" (1.608 m)   Wt (!) 353 lb (160.1 kg)   LMP  (LMP Unknown)   SpO2 97%   BMI 61.94 kg/m    Subjective:    Patient ID: [name], female    DOB: 27-Jun-1940, 77 y.o.   MRN: [id number]  HPI: [name] is a 77 y.o. female presents for annual physical  Chief Complaint  Patient presents with  . Annual Exam    pt states she has been feeling weak and tired lately   HYPERTENSION/HLD: Continues on daily BP medications and Atorvastatin for HLD with no ADR reported. Hypertension status: controlled  Satisfied with current treatment? yes Duration of hypertension: chronic BP monitoring frequency:  rarely BP range:  BP medication side effects:  no Medication compliance: good compliance Previous BP meds: none Aspirin: yes Recurrent headaches: no Visual changes: no Palpitations: no Dyspnea: no Chest pain: no Lower extremity edema: yes Dizzy/lightheaded: no  Utilizes CPAP machine at home, which she started using in June.  She sleeps an average of 9 hours a night.  THYROID NODULE: Sees Dr. Tami Ribas and last saw him in the Spring.  Currently not on medication for thyroid.  Reports some increased fatigue and weakness.  Denies cold intolerance, weight gain, or constipation. Thyroid control status:controlled Satisfied with current treatment? no Medication side effects: no Medication compliance: none Etiology of hypothyroidism:  Recent dose adjustment:no Fatigue: yes Cold intolerance: no Heat intolerance: no Weight gain: no Weight loss: yes Constipation: yes Diarrhea/loose stools: no Palpitations: no Lower extremity edema: yes, chronic lymphedema Anxiety/depressed mood: yes   DEPRESSION: Currently takes Bupropion twice daily, but reports she has not been taking evening dose for fear of "not sleeping well".  Continues to have episodes of tearfulness daily.  Has had this mood since husband passed away 2 1/2 years  ago.  Reports some increased fatigue and feeling weak lately.  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 Mood status: stable Satisfied with current treatment?: no Symptom severity: mild  Duration of current treatment : chronic Side effects: no Medication compliance: excellent compliance Psychotherapy/counseling: no none Previous psychiatric medications: none Depressed mood: yes Anxious mood: yes Anhedonia: no Significant weight loss or gain: no Insomnia: no sleeps well now if CPAP Fatigue: yes Feelings of worthlessness or guilt: no Impaired concentration/indecisiveness: no Suicidal ideations: no Hopelessness: no Crying spells: yes    Depression screen [facility] 2/9 03/09/2018 03/09/2018 01/01/2018 12/05/2017 05/16/2017  Decreased Interest 0 0 0 0 0  Down, Depressed, Hopeless 0 0 0 0 0  PHQ - 2 Score 0 0 0 0 0  Altered sleeping 1 1 1  0 0  Tired, decreased energy 2 2 0 0 1  Change in appetite 0 0 0 0 0  Feeling bad or failure about yourself  0 0 0 0 0  Trouble concentrating 0 0 0 0 0  Moving slowly or fidgety/restless 0 0 0 0 0  Suicidal thoughts 0 0 0 0 0  PHQ-9 Score 3 3 1  0 1   LYMPHEDEMA: Chronic, ongoing.  No recent infection.  Denies any worsening and wears TED hose daily.  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  No current wounds to extremities.  GERD: Chronic, stable with daily Omeprazole.  Denies heart burn, N&V, or chest pain.  Continues on daily Magnesium supplement.  Relevant past medical,  surgical, family and social history reviewed and updated as indicated. Interim medical history since our last visit reviewed. Allergies and medications reviewed and updated.  Review of Systems  Constitutional: Positive for fatigue. Negative for activity change, appetite change and unexpected weight change.  HENT: Negative.   Eyes: Negative.   Respiratory: Negative for cough, chest tightness and shortness of breath.     Cardiovascular: Negative for chest pain, palpitations and leg swelling.  Gastrointestinal: Negative for abdominal distention, abdominal pain, constipation, diarrhea, nausea and vomiting.  Endocrine: Negative for cold intolerance, heat intolerance, polydipsia, polyphagia and polyuria.  Genitourinary: Negative for decreased urine volume, dysuria, frequency and urgency.  Musculoskeletal: Negative.   Allergic/Immunologic: Negative.   Neurological: Positive for weakness. Negative for dizziness, tremors, syncope, numbness and headaches.  Hematological: Negative.   Psychiatric/Behavioral: Negative for behavioral problems, decreased concentration and sleep disturbance. The patient is not nervous/anxious.    Per HPI unless specifically indicated above     Objective:    BP 104/68   Pulse 64   Temp 97.9 F (36.6 C) (Oral)   Ht 5' 3.3" (1.608 m)   Wt (!) 353 lb (160.1 kg)   LMP  (LMP Unknown)   SpO2 97%   BMI 61.94 kg/m   Wt Readings from Last 3 Encounters:  03/09/18 (!) 353 lb (160.1 kg)  01/02/18 (!) 355 lb (161 kg)  01/01/18 (!) 347 lb (157.4 kg)    Physical Exam  Constitutional: She is oriented to person, place, and time. She appears well-developed and well-nourished.  HENT:  Head: Normocephalic and atraumatic.  Right Ear: External ear normal.  Left Ear: External ear normal.  Nose: Nose normal.  Mouth/Throat: Oropharynx is clear and moist.  Eyes: Pupils are equal, round, and reactive to light. EOM are normal.  Neck: Normal range of motion. Neck supple.  Cardiovascular: Normal rate, regular rhythm, normal heart sounds and intact distal pulses.  Chronic lymphedema to BLE with compression hose in place.  Pulmonary/Chest: Effort normal and breath sounds normal.  Abdominal: Soft. Bowel sounds are normal.  Musculoskeletal:  Decreased strength BUE and BLE.  Slight decrease ROM BUE and BLE, baseline.  Lymphadenopathy:    She has no cervical adenopathy.  Neurological: She is alert  and oriented to person, place, and time. She has normal strength. No cranial nerve deficit.  Reflex Scores:      Patellar reflexes are 1+ on the right side and 1+ on the left side. Skin: Skin is warm and dry.  Psychiatric: She has a normal mood and affect. Her behavior is normal. Judgment and thought content normal.  Nursing note and vitals reviewed. Breast exam deferred per patient request.  She reports d/t mobility providers allow her to perform exams at home (as she is unable to get up on exam table) and she is to notify them if any breast changes noted.  Results for orders placed or performed in visit on 06/26/17  Veritor Flu A/B Waived  Result Value Ref Range   Influenza A Negative Negative   Influenza B Negative Negative      Assessment & Plan:   Problem List Items Addressed This Visit      Cardiovascular and Mediastinum   Essential hypertension, benign    Chronic, ongoing with good control at this time.  At goal <140/90.  Continue current medication regimen and CMP today to monitor kidney function.        Digestive   GERD without esophagitis    Chronic, ongoing.  Continue Omeprazole  and daily Mag supplement.  Recheck Mag level today.        Endocrine   Thyroid nodule    Chronic, ongoing. Followed by Dr. Tami Ribas.  Apparently benign nodule.  Due to increased fatigue and weakness will obtain TSH today.        Other   Lymphedema    Chronic, ongoing.  Continue with massage therapy.  Consider vein & vascular consult if worsening.  Continue support hose daily.  CBC, CMP today.      Depression    Chronic, ongoing.  PHQ9 today 3, previous 1.  Continue current medication regimen and encouraged patient to take medication as ordered, as has not been taking evening dose medication.        BMI 60.0-69.9, adult (HCC)    Chronic, ongoing.  Is going to restart [name], as she found this beneficial in past.  Encouraged use of chair exercises at home or water aerobics.       Hyperlipidemia    Chronic, ongoing.  Continues on daily statin.         Other Visit Diagnoses    Annual physical exam    -  Primary   Relevant Orders   Lipid Profile   CBC w/Diff   Comp Met (CMET)   TSH   HgB A1c   Vitamin D (25 hydroxy)   Magnesium   B12       Follow up plan: Return in about 6 months (around 09/08/2018) for HTN and lymphedema.

## 2018-03-09 NOTE — Assessment & Plan Note (Addendum)
Chronic, ongoing.  PHQ9 today 3, previous 1.  Continue current medication regimen and encouraged patient to take medication as ordered, as has not been taking evening dose medication.

## 2018-03-09 NOTE — Assessment & Plan Note (Signed)
Chronic, ongoing. Followed by Dr. Jenne Campus.  Apparently benign nodule.  Due to increased fatigue and weakness will obtain TSH today.

## 2018-03-09 NOTE — Assessment & Plan Note (Signed)
Chronic, ongoing with good control at this time.  At goal <140/90.  Continue current medication regimen and CMP today to monitor kidney function.

## 2018-03-09 NOTE — Patient Instructions (Signed)

## 2018-03-09 NOTE — Assessment & Plan Note (Signed)
Chronic, ongoing.  Continue with massage therapy.  Consider vein & vascular consult if worsening.  Continue support hose daily.  CBC, CMP today.

## 2018-03-09 NOTE — Assessment & Plan Note (Signed)
Chronic, ongoing.  Continue Omeprazole and daily Mag supplement.  Recheck Mag level today.

## 2018-03-09 NOTE — Assessment & Plan Note (Signed)
Chronic, ongoing.  Is going to restart [name], as she found this beneficial in past.  Encouraged use of chair exercises at home or water aerobics.

## 2018-03-10 ENCOUNTER — Telehealth: Payer: Self-pay | Admitting: Unknown Physician Specialty

## 2018-03-10 LAB — COMPREHENSIVE METABOLIC PANEL
ALBUMIN: 4.4 g/dL (ref 3.5–4.8)
ALK PHOS: 93 IU/L (ref 39–117)
ALT: 15 IU/L (ref 0–32)
AST: 13 IU/L (ref 0–40)
Albumin/Globulin Ratio: 1.9 (ref 1.2–2.2)
BILIRUBIN TOTAL: 0.5 mg/dL (ref 0.0–1.2)
BUN/Creatinine Ratio: 20 (ref 12–28)
BUN: 17 mg/dL (ref 8–27)
CHLORIDE: 103 mmol/L (ref 96–106)
CO2: 22 mmol/L (ref 20–29)
CREATININE: 0.86 mg/dL (ref 0.57–1.00)
Calcium: 10.1 mg/dL (ref 8.7–10.3)
GFR calc Af Amer: 75 mL/min/{1.73_m2} (ref >59)
GFR calc non Af Amer: 65 mL/min/{1.73_m2} (ref >59)
Globulin, Total: 2.3 g/dL (ref 1.5–4.5)
Glucose: 92 mg/dL (ref 65–99)
Potassium: 5 mmol/L (ref 3.5–5.2)
Sodium: 141 mmol/L (ref 134–144)
Total Protein: 6.7 g/dL (ref 6.0–8.5)

## 2018-03-10 LAB — CBC WITH DIFFERENTIAL/PLATELET
Basophils Absolute: 0 10*3/uL (ref 0.0–0.2)
Basos: 1 %
EOS (ABSOLUTE): 0 10*3/uL (ref 0.0–0.4)
Eos: 1 %
HEMOGLOBIN: 14.4 g/dL (ref 11.1–15.9)
Hematocrit: 43.4 % (ref 34.0–46.6)
IMMATURE GRANS (ABS): 0 10*3/uL (ref 0.0–0.1)
Immature Granulocytes: 0 %
LYMPHS ABS: 1.5 10*3/uL (ref 0.7–3.1)
LYMPHS: 33 %
MCH: 30.6 pg (ref 26.6–33.0)
MCHC: 33.2 g/dL (ref 31.5–35.7)
MCV: 92 fL (ref 79–97)
MONOCYTES: 9 %
Monocytes Absolute: 0.4 10*3/uL (ref 0.1–0.9)
NEUTROS ABS: 2.5 10*3/uL (ref 1.4–7.0)
Neutrophils: 56 %
PLATELETS: 264 10*3/uL (ref 150–450)
RBC: 4.71 x10E6/uL (ref 3.77–5.28)
RDW: 13.3 % (ref 12.3–15.4)
WBC: 4.5 10*3/uL (ref 3.4–10.8)

## 2018-03-10 LAB — VITAMIN D 25 HYDROXY (VIT D DEFICIENCY, FRACTURES): VIT D 25 HYDROXY: 27.1 ng/mL — AB (ref 30.0–100.0)

## 2018-03-10 LAB — LIPID PANEL
Chol/HDL Ratio: 2.4 ratio (ref 0.0–4.4)
Cholesterol, Total: 224 mg/dL — ABNORMAL HIGH (ref 100–199)
HDL: 94 mg/dL (ref >39)
LDL CALC: 111 mg/dL — AB (ref 0–99)
Triglycerides: 96 mg/dL (ref 0–149)
VLDL Cholesterol Cal: 19 mg/dL (ref 5–40)

## 2018-03-10 LAB — MAGNESIUM: MAGNESIUM: 1.9 mg/dL (ref 1.6–2.3)

## 2018-03-10 LAB — HEMOGLOBIN A1C
ESTIMATED AVERAGE GLUCOSE: 108 mg/dL
HEMOGLOBIN A1C: 5.4 % (ref 4.8–5.6)

## 2018-03-10 LAB — VITAMIN B12: Vitamin B-12: 295 pg/mL (ref 232–1245)

## 2018-03-10 LAB — TSH: TSH: 2.66 u[IU]/mL (ref 0.450–4.500)

## 2018-03-10 NOTE — Telephone Encounter (Signed)
Copied from CRM [id number]. Topic: Quick Communication - See Telephone Encounter >> Mar 10, 2018  4:38 PM Arlyss Gandy, NT wrote: CRM for notification. See Telephone encounter for: 03/10/18. Pt calling to give the strength of her Vitamin D. She states it is Vitamin D3 2000IU.

## 2018-03-11 NOTE — Telephone Encounter (Signed)
I attempted to call [name] back to let her know I want her to take two tablets (4000 units) each day.  General message left for her to call office and talk to me for update.

## 2018-03-11 NOTE — Telephone Encounter (Signed)
Pt called back to speak with [name] pt hung up when someone at office tried to find her

## 2018-04-01 ENCOUNTER — Ambulatory Visit: Payer: BC Managed Care – PPO | Admitting: Nurse Practitioner

## 2018-04-04 IMAGING — DX DG CHEST 2V
3 series · 3 of 3 positions shown · non-contrast
Comparison: 04/02/2017

CLINICAL DATA: Productive cough

EXAM:
CHEST - 2 VIEW

[chest pa]
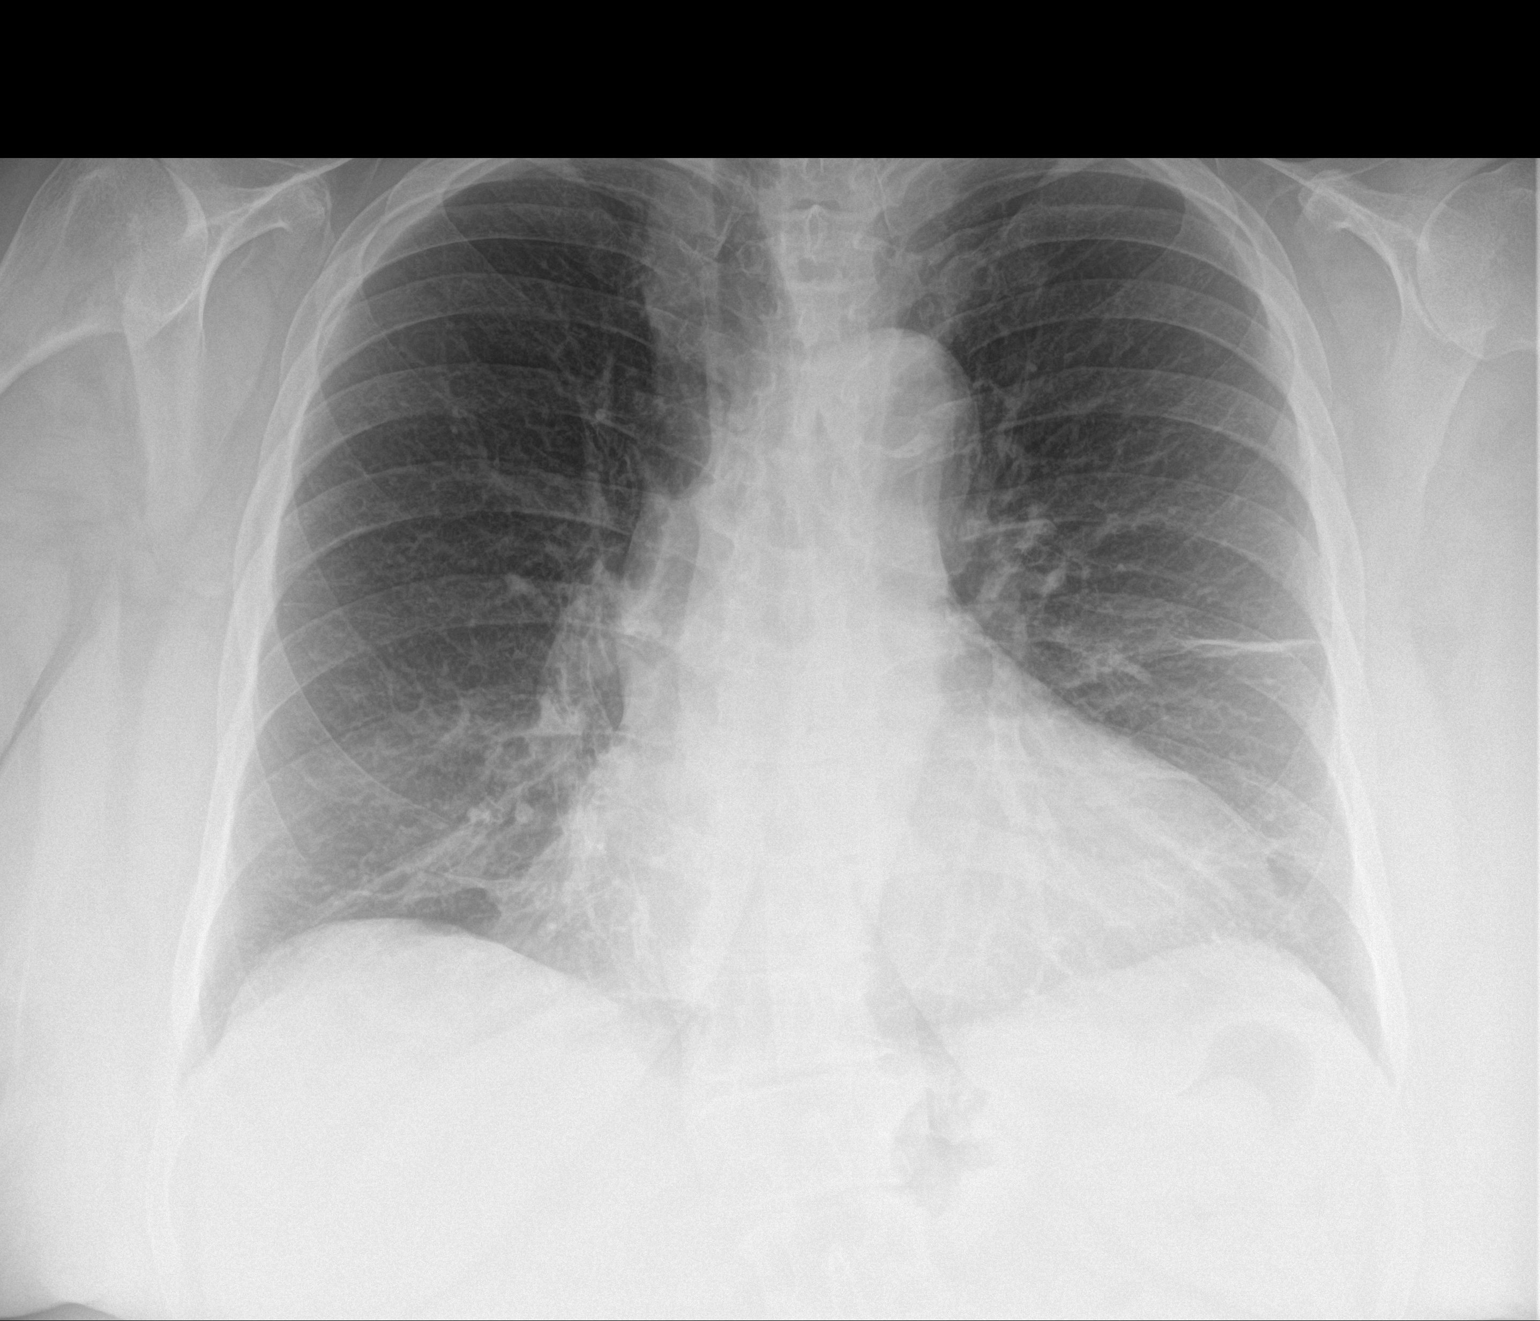

[chest lat (1 of 2)]
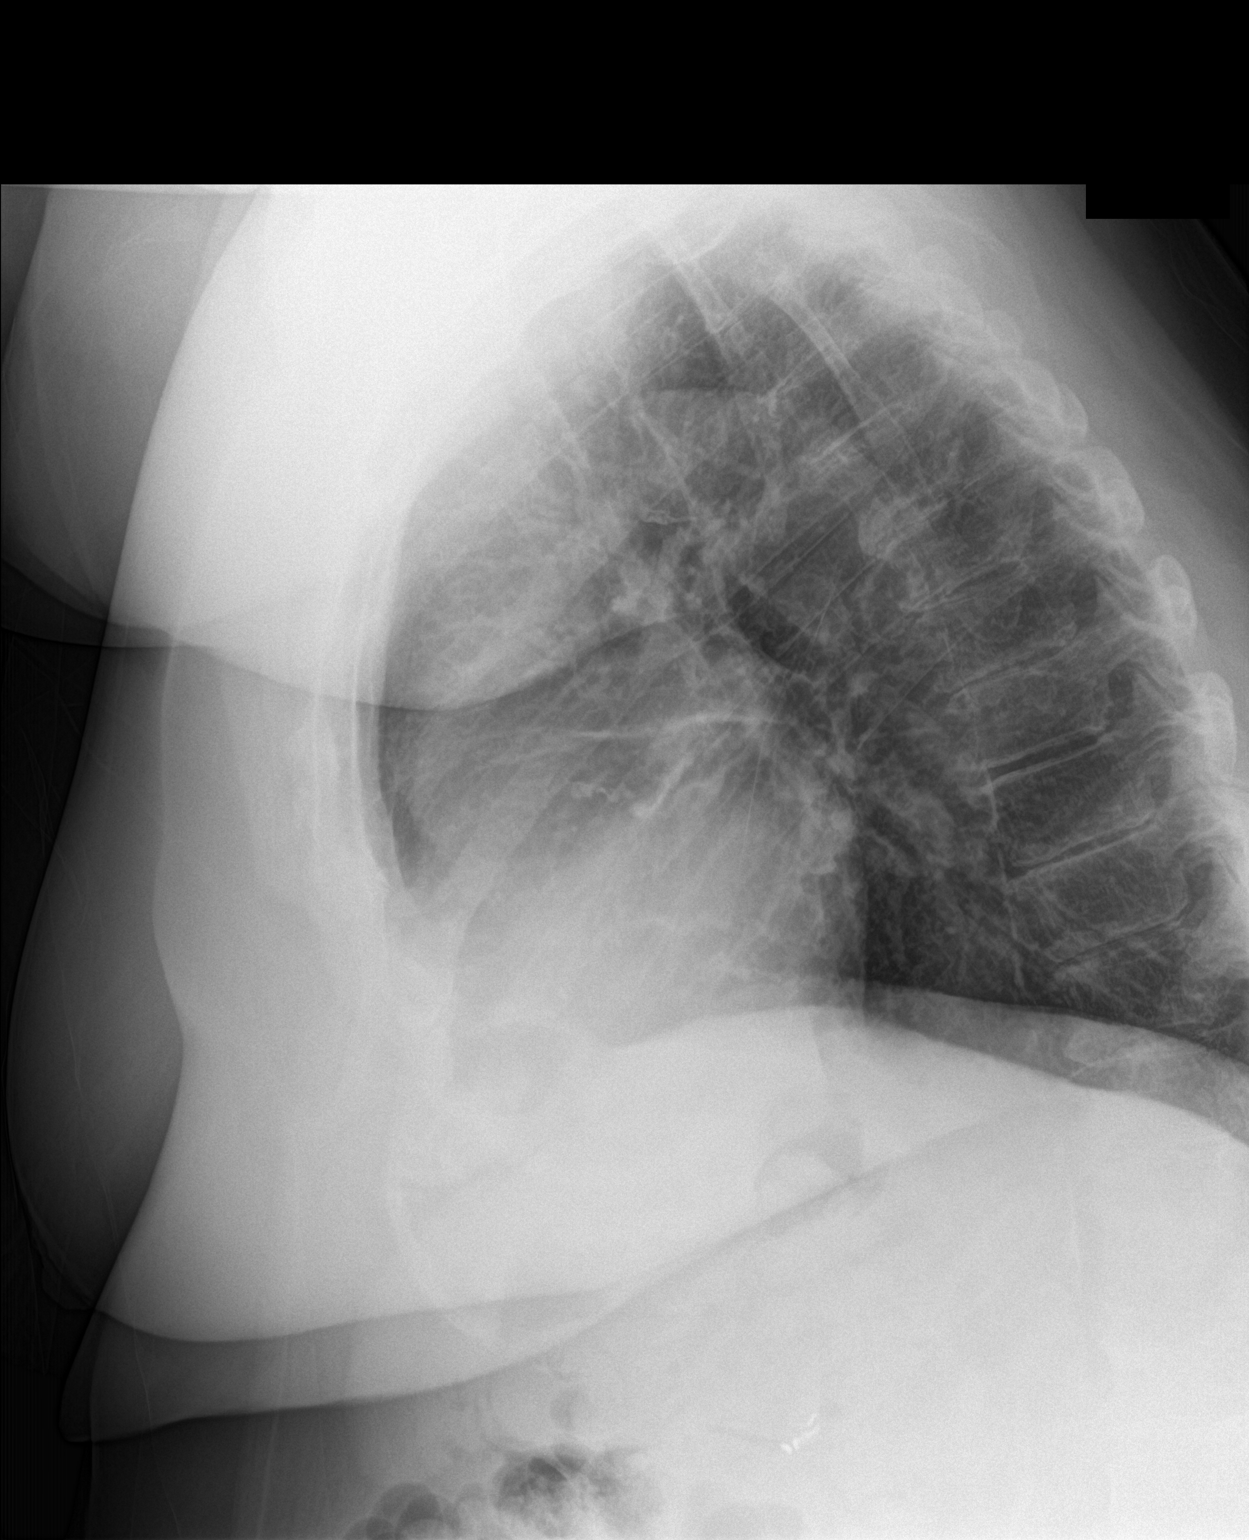

[chest lat (2 of 2)]
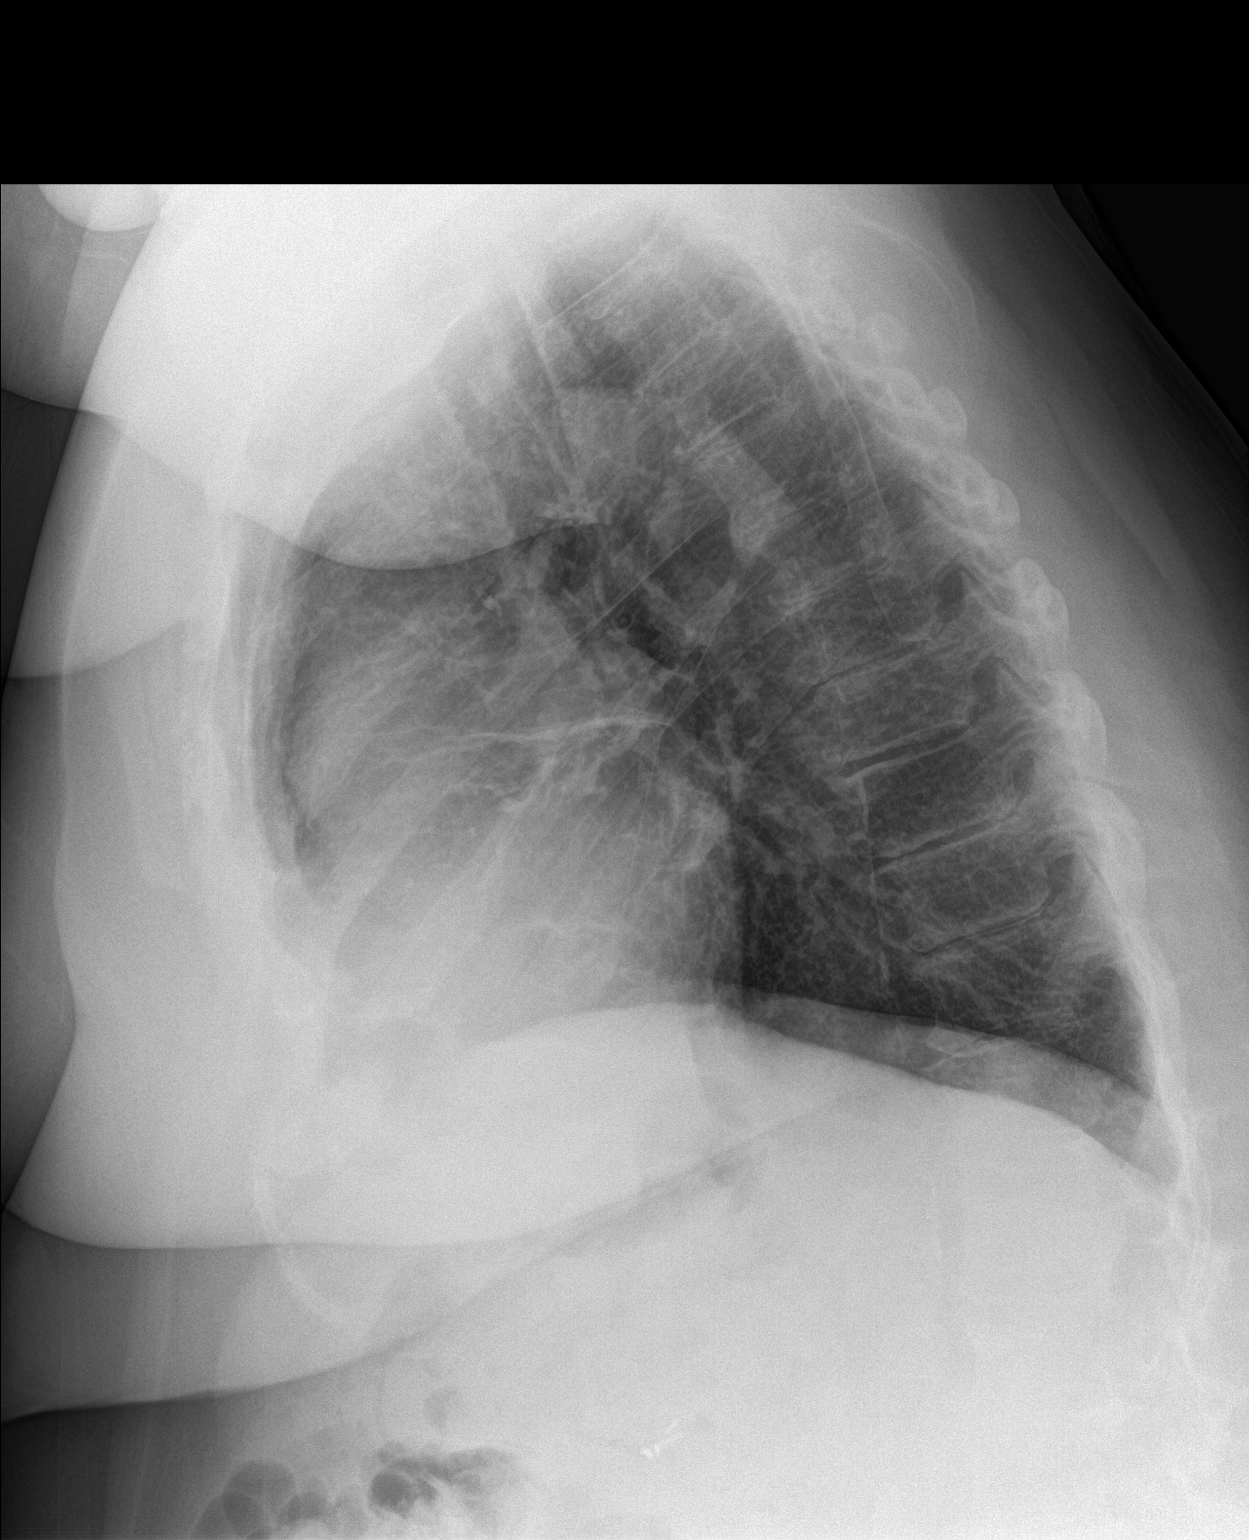

[3 of 3 positions shown; findings below may reference images not displayed]

FINDINGS: Cardiac shadow is enlarged. Aortic calcifications are seen. Scarring
in the left mid lung is again noted and stable. No focal infiltrate
or effusion is seen. Degenerative changes of the thoracic spine are
noted.
IMPRESSION: Chronic changes without acute abnormality.

## 2018-04-08 ENCOUNTER — Ambulatory Visit: Payer: BC Managed Care – PPO | Admitting: Nurse Practitioner

## 2018-04-13 ENCOUNTER — Ambulatory Visit: Payer: BC Managed Care – PPO | Admitting: Nurse Practitioner

## 2018-06-08 ENCOUNTER — Other Ambulatory Visit: Payer: Self-pay

## 2018-06-08 ENCOUNTER — Encounter: Payer: Self-pay | Admitting: Nurse Practitioner

## 2018-06-08 ENCOUNTER — Ambulatory Visit (INDEPENDENT_AMBULATORY_CARE_PROVIDER_SITE_OTHER): Payer: BC Managed Care – PPO | Admitting: Nurse Practitioner

## 2018-06-08 VITALS — BP 138/78 | HR 66 | Temp 97.9°F | Ht 63.5 in | Wt 357.0 lb

## 2018-06-08 DIAGNOSIS — R1031 Right lower quadrant pain: Secondary | ICD-10-CM

## 2018-06-08 DIAGNOSIS — E041 Nontoxic single thyroid nodule: Secondary | ICD-10-CM

## 2018-06-08 DIAGNOSIS — E559 Vitamin D deficiency, unspecified: Secondary | ICD-10-CM | POA: Diagnosis not present

## 2018-06-08 DIAGNOSIS — E538 Deficiency of other specified B group vitamins: Secondary | ICD-10-CM | POA: Diagnosis not present

## 2018-06-08 DIAGNOSIS — R5383 Other fatigue: Secondary | ICD-10-CM

## 2018-06-08 MED ORDER — CLINDAMYCIN HCL 150 MG PO CAPS: 150.0000 mg | ORAL_CAPSULE | Freq: Three times a day (TID) | ORAL | 0 refills | Status: AC

## 2018-06-08 NOTE — Assessment & Plan Note (Signed)
Denies any cardiac symptoms or SOB/orthopnea.  Recent initiation of treatment for Vit B12 and D deficiency.  Will recheck levels today.  Consider higher dose Vit D and B12 injections if levels remain low.  H/O thyroid nodule, check TSH today.  Will also check anemia panel, CBC, and CMP.  Patient is morbidly obese and continues to work 5 days a week, she is currently focused on retiring which may benefit health.

## 2018-06-08 NOTE — Assessment & Plan Note (Addendum)
Will initiate Clindamycin since this has worked for patient in past when presented similarly.  Obtain CMP and CBC today.  Appears to have underlying, long standing issues with constipation.  Have recommended use of daily Miralax, will trial this as Colace has not worked in past.  Consider Linzess if no benefit from [employer].

## 2018-06-08 NOTE — Assessment & Plan Note (Signed)
B12 level today,  Adjust supplement as needed.

## 2018-06-08 NOTE — Patient Instructions (Signed)
Vitamin B12 Deficiency Vitamin B12 deficiency occurs when the body does not have enough vitamin B12. Vitamin B12 is an important vitamin. The body needs vitamin B12:  To make red blood cells.  To make DNA. This is the genetic material inside cells.  To help the nerves work properly so they can carry messages from the brain to the body. Vitamin B12 deficiency can cause various health problems, such as a low red blood cell count (anemia) or nerve damage. What are the causes? This condition may be caused by:  Not eating enough foods that contain vitamin B12.  Not having enough stomach acid and digestive fluids to properly absorb vitamin B12 from the food that you eat.  Certain digestive system diseases that make it hard to absorb vitamin B12. These diseases include Crohn disease, chronic pancreatitis, and cystic fibrosis.  Pernicious anemia. This is a condition in which the body does not make enough of a protein (intrinsic factor), resulting in too few red blood cells.  Having a surgery in which part of the stomach or small intestine is removed.  Taking certain medicines that make it hard for the body to absorb vitamin B12. These medicines include: ? Heartburn medicine (antacids and proton pump inhibitors). ? An antibiotic medicine called neomycin. ? Some medicines that are used to treat diabetes, tuberculosis, gout, or high cholesterol. What increases the risk? The following factors may make you more likely to develop a B12 deficiency:  Being older than age 69.  Eating a vegetarian or vegan diet, especially while you are pregnant.  Eating a poor diet while you are pregnant.  Taking certain drugs.  Having alcoholism. What are the signs or symptoms? In some cases, there are no symptoms of this condition. If the condition leads to anemia or nerve damage, various symptoms can occur, such as:  Weakness.  Fatigue.  Loss of appetite.  Weight loss.  Numbness or tingling in your  hands and feet.  Redness and burning of the tongue.  Confusion or memory problems.  Depression.  Sensory problems, such as color blindness, ringing in the ears, or loss of taste.  Diarrhea or constipation.  Trouble walking. If anemia is severe, symptoms can include:  Shortness of breath.  Dizziness.  Rapid heart rate (tachycardia).  How is this diagnosed? This condition may be diagnosed with a blood test to measure the level of vitamin B12 in your blood. You may have other tests to help find the cause of your vitamin B12 deficiency. These tests may include:  A complete blood count (CBC). This is a group of tests that measure certain characteristics of blood cells.  A blood test to measure intrinsic factor.  An endoscopy. In this procedure, a thin tube with a camera on the end is used to look into your stomach or intestines. How is this treated? Treatment for this condition depends on the cause. Common treatment options include:  Changing your eating and drinking habits, such as: ? Eating more foods that contain vitamin B12. ? Drinking less alcohol or no alcohol.  Taking vitamin B12 supplements. Your health care provider will tell you which dosage is best for you.  Getting vitamin B12 injections. Follow these instructions at home:  Take supplements only as told by your health care provider. Follow the directions carefully.  Get any injections that are prescribed by your health care provider.  Do not miss your appointments.  Eat lots of healthy foods that contain vitamin B12. Ask your health care provider if  you should work with a Microbiologist. Foods that contain vitamin B12 include: ? Meat. ? Meat from birds (poultry). ? Fish. ? Eggs. ? Cereal and dairy products that are fortified. This means that vitamin B12 has been added to the food. Check the label on the package to see if the food is fortified.  Do not abuse alcohol.  Keep all follow-up visits as told by your  health care provider. This is important. Contact a health care provider if:  Your symptoms come back. Get help right away if:  You develop shortness of breath.  You have chest pain.  You become dizzy or you lose consciousness. This information is not intended to replace advice given to you by your health care provider. Make sure you discuss any questions you have with your health care provider. Document Released: 07/29/2011 Document Revised: 10/18/2015 Document Reviewed: 09/21/2014 Elsevier Interactive Patient Education  2019 [employer]. Fatigue If you have fatigue, you feel tired all the time and have a lack of energy or a lack of motivation. Fatigue may make it difficult to start or complete tasks because of exhaustion. In general, occasional or mild fatigue is often a normal response to activity or life. However, long-lasting (chronic) or extreme fatigue may be a symptom of a medical condition. Follow these instructions at home: General instructions  Watch your fatigue for any changes.  Go to bed and get up at the same time every day.  Avoid fatigue by pacing yourself during the day and getting enough sleep at night.  Maintain a healthy weight. Medicines  Take over-the-counter and prescription medicines only as told by your health care provider.  Take a multivitamin, if told by your health care provider.  Do not use herbal or dietary supplements unless they are approved by your health care provider. Activity   Exercise regularly, as told by your health care provider.  Use or practice techniques to help you relax, such as yoga, tai chi, meditation, or massage therapy. Eating and drinking   Avoid heavy meals in the evening.  Eat a well-balanced diet, which includes lean proteins, whole grains, plenty of fruits and vegetables, and low-fat dairy products.  Avoid consuming too much caffeine.  Avoid the use of alcohol.  Drink enough fluid to keep your urine pale  yellow. Lifestyle  Change situations that cause you stress. Try to keep your work and personal schedule in balance.  Do not use any products that contain nicotine or tobacco, such as cigarettes and e-cigarettes. If you need help quitting, ask your health care provider.  Do not use drugs. Contact a health care provider if:  Your fatigue does not get better.  You have a fever.  You suddenly lose or gain weight.  You have headaches.  You have trouble falling asleep or sleeping through the night.  You feel angry, guilty, anxious, or sad.  You are unable to have a bowel movement (constipation).  Your skin is dry.  You have swelling in your legs or another part of your body. Get help right away if:  You feel confused.  Your vision is blurry.  You feel faint or you pass out.  You have a severe headache.  You have severe pain in your abdomen, your back, or the area between your waist and hips (pelvis).  You have chest pain, shortness of breath, or an irregular or fast heartbeat.  You are unable to urinate, or you urinate less than normal.  You have abnormal bleeding, such as  bleeding from the rectum, vagina, nose, lungs, or nipples.  You vomit blood.  You have thoughts about hurting yourself or others. If you ever feel like you may hurt yourself or others, or have thoughts about taking your own life, get help right away. You can go to your nearest emergency department or call:  Your local emergency services (911 in the [place]  A suicide crisis helpline, such as the [employer] at 437-453-9539. This is open 24 hours a day. Summary  If you have fatigue, you feel tired all the time and have a lack of energy or a lack of motivation.  Fatigue may make it difficult to start or complete tasks because of exhaustion.  Long-lasting (chronic) or extreme fatigue may be a symptom of a medical condition.  Exercise regularly, as told by your health  care provider.  Change situations that cause you stress. Try to keep your work and personal schedule in balance. This information is not intended to replace advice given to you by your health care provider. Make sure you discuss any questions you have with your health care provider. Document Released: 03/03/2007 Document Revised: 01/29/2017 Document Reviewed: 01/29/2017 Elsevier Interactive Patient Education  [employer].

## 2018-06-08 NOTE — Progress Notes (Signed)
BP 138/78   Pulse 66   Temp 97.9 F (36.6 C) (Oral)   Ht 5' 3.5" (1.613 m)   Wt (!) 357 lb (161.9 kg)   LMP  (LMP Unknown)   SpO2 96%   BMI 62.25 kg/m    Subjective:    Patient ID: [name], female    DOB: [birth year], 78 y.o.   MRN: [id number]  HPI: [name] is a 78 y.o. female presents for acute visit  Chief Complaint  Patient presents with  . Pain    right side lower abdomen x 2 weeks on and off  . Fatigue    pt states seems like her vitamin B12 and D3 is low   ABDOMINAL PAIN  She reports h/o similar and was told it was "diverticulitis on opposite side", got treated with abx and it would go away.  Has used Clindamycin in past for this and states this "always made it better".  Has allergies to multiple abx.  She reports history of ongoing constipation for several years, has tried Colace but it made her feel bad.  Has not used Miralax.  States she is "lucky if I go two times a week".  Does have to strain to go when she has a bowel movement.   Duration:weeks Onset: gradual Severity: mild Quality: dull and aching Location:  RLQ  Episode duration:  Radiation: no Frequency: intermittent Alleviating factors:  Aggravating factors: Status: fluctuating Treatments attempted: none Fever: no Nausea: yes Vomiting: no Weight loss: no Decreased appetite: no Diarrhea: no Constipation: yes , takes probiotics which she states helps  Blood in stool: no Heartburn: no Jaundice: no Rash: no Dysuria/urinary frequency: no Hematuria: no History of sexually transmitted disease: no Recurrent NSAID use: no   FATIGUE Continues to work, 5 days a week, as a Corporate treasurer.  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  Does report using a chair at work and not on feet a lot at baseline.  She does report she will be retiring soon as she is starting to feel it is time.  Recently started taking B12 and Vitamin D after recent low levels noted. Duration:  weeks Severity: moderate    Onset: gradual Context when symptoms started:  unknown Symptoms improve with rest: yes  Depressive symptoms: no Stress/anxiety: no Insomnia: none Snoring: no Observed apnea by bed partner: no Daytime hypersomnolence:yes Wakes feeling refreshed: yes History of sleep study: yes , uses CPA at home Dysnea on exertion:  no Orthopnea/PND: no Chest pain: no Chronic cough: no Lower extremity edema: yes, lymphedema at baseline Arthralgias:no Myalgias: no Weakness: no Rash: no  Relevant past medical, surgical, family and social history reviewed and updated as indicated. Interim medical history since our last visit reviewed. Allergies and medications reviewed and updated.  Review of Systems  Constitutional: Positive for fatigue. Negative for activity change, appetite change, diaphoresis and fever.  Respiratory: Negative for cough, chest tightness and shortness of breath.   Cardiovascular: Negative for chest pain, palpitations and leg swelling.  Gastrointestinal: Positive for abdominal pain (RLQ) and constipation. Negative for abdominal distention, diarrhea, nausea and vomiting.  Endocrine: Negative for cold intolerance, heat intolerance, polydipsia, polyphagia and polyuria.  Neurological: Negative for dizziness, syncope, weakness, light-headedness, numbness and headaches.  Psychiatric/Behavioral: Negative.     Per HPI unless specifically indicated above     Objective:    BP 138/78   Pulse 66   Temp 97.9 F (36.6 C) (Oral)   Ht 5' 3.5" (1.613  m)   Wt (!) 357 lb (161.9 kg)   LMP  (LMP Unknown)   SpO2 96%   BMI 62.25 kg/m   Wt Readings from Last 3 Encounters:  06/08/18 (!) 357 lb (161.9 kg)  03/09/18 (!) 353 lb (160.1 kg)  01/02/18 (!) 355 lb (161 kg)    Physical Exam Vitals signs and nursing note reviewed.  Constitutional:      General: She is awake.     Appearance: She is well-developed. She is morbidly obese.  HENT:     Head: Normocephalic.     Right Ear: Hearing  normal.     Left Ear: Hearing normal.     Nose: Nose normal.     Mouth/Throat:     Mouth: Mucous membranes are moist.  Eyes:     General: Lids are normal.        Right eye: No discharge.        Left eye: No discharge.     Conjunctiva/sclera: Conjunctivae normal.     Pupils: Pupils are equal, round, and reactive to light.  Neck:     Musculoskeletal: Normal range of motion and neck supple.     Thyroid: No thyromegaly.     Vascular: No carotid bruit or JVD.  Cardiovascular:     Rate and Rhythm: Normal rate and regular rhythm.     Heart sounds: Normal heart sounds. No murmur. No gallop.      Comments: Uses walker at baseline.  No SOB noted with walk to lab and return to room. Pulmonary:     Effort: Pulmonary effort is normal.     Breath sounds: Normal breath sounds.  Abdominal:     General: Bowel sounds are normal.     Palpations: Abdomen is soft. There is no hepatomegaly or splenomegaly.     Tenderness: There is no guarding or rebound. Negative signs include Murphy's sign, Rovsing's sign and McBurney's sign.     Comments: No tenderness with light or deep palpation.  Obese abdomen.  Musculoskeletal:     Right lower leg: No edema.     Left lower leg: No edema.  Lymphadenopathy:     Cervical: No cervical adenopathy.  Skin:    General: Skin is warm and dry.  Neurological:     Mental Status: She is alert and oriented to person, place, and time.  Psychiatric:        Attention and Perception: Attention normal.        Mood and Affect: Mood normal.        Behavior: Behavior normal. Behavior is cooperative.        Thought Content: Thought content normal.        Judgment: Judgment normal.     Results for orders placed or performed in visit on 03/09/18  Lipid Profile  Result Value Ref Range   Cholesterol, Total 224 (H) 100 - 199 mg/dL   Triglycerides 96 0 - 149 mg/dL   HDL 94 >39 mg/dL   VLDL Cholesterol Cal 19 5 - 40 mg/dL   LDL Calculated 111 (H) 0 - 99 mg/dL   Chol/HDL Ratio  2.4 0.0 - 4.4 ratio  CBC w/Diff  Result Value Ref Range   WBC 4.5 3.4 - 10.8 x10E3/uL   RBC 4.71 3.77 - 5.28 x10E6/uL   Hemoglobin 14.4 11.1 - 15.9 g/dL   Hematocrit 43.4 34.0 - 46.6 %   MCV 92 79 - 97 fL   MCH 30.6 26.6 - 33.0 pg   MCHC  33.2 31.5 - 35.7 g/dL   RDW 13.3 12.3 - 15.4 %   Platelets 264 150 - 450 x10E3/uL   Neutrophils 56 Not Estab. %   Lymphs 33 Not Estab. %   Monocytes 9 Not Estab. %   Eos 1 Not Estab. %   Basos 1 Not Estab. %   Neutrophils Absolute 2.5 1.4 - 7.0 x10E3/uL   Lymphocytes Absolute 1.5 0.7 - 3.1 x10E3/uL   Monocytes Absolute 0.4 0.1 - 0.9 x10E3/uL   EOS (ABSOLUTE) 0.0 0.0 - 0.4 x10E3/uL   Basophils Absolute 0.0 0.0 - 0.2 x10E3/uL   Immature Granulocytes 0 Not Estab. %   Immature Grans (Abs) 0.0 0.0 - 0.1 x10E3/uL  Comp Met (CMET)  Result Value Ref Range   Glucose 92 65 - 99 mg/dL   BUN 17 8 - 27 mg/dL   Creatinine, Ser 0.86 0.57 - 1.00 mg/dL   GFR calc non Af Amer 65 >59 mL/min/1.73   GFR calc Af Amer 75 >59 mL/min/1.73   BUN/Creatinine Ratio 20 12 - 28   Sodium 141 134 - 144 mmol/L   Potassium 5.0 3.5 - 5.2 mmol/L   Chloride 103 96 - 106 mmol/L   CO2 22 20 - 29 mmol/L   Calcium 10.1 8.7 - 10.3 mg/dL   Total Protein 6.7 6.0 - 8.5 g/dL   Albumin 4.4 3.5 - 4.8 g/dL   Globulin, Total 2.3 1.5 - 4.5 g/dL   Albumin/Globulin Ratio 1.9 1.2 - 2.2   Bilirubin Total 0.5 0.0 - 1.2 mg/dL   Alkaline Phosphatase 93 39 - 117 IU/L   AST 13 0 - 40 IU/L   ALT 15 0 - 32 IU/L  TSH  Result Value Ref Range   TSH 2.660 0.450 - 4.500 uIU/mL  HgB A1c  Result Value Ref Range   Hgb A1c MFr Bld 5.4 4.8 - 5.6 %   Est. average glucose Bld gHb Est-mCnc 108 mg/dL  Vitamin D (25 hydroxy)  Result Value Ref Range   Vit D, 25-Hydroxy 27.1 (L) 30.0 - 100.0 ng/mL  Magnesium  Result Value Ref Range   Magnesium 1.9 1.6 - 2.3 mg/dL  B12  Result Value Ref Range   Vitamin B-12 295 232 - 1,245 pg/mL      Assessment & Plan:   Problem List Items Addressed This Visit        Endocrine   Thyroid nodule    Followed by Dr. Tami Ribas.  Due to continued c/o fatigue will obtain TSH today.      Relevant Orders   TSH     Other   Right lower quadrant abdominal pain - Primary    Will initiate Clindamycin since this has worked for patient in past when presented similarly.  Obtain CMP and CBC today.  Appears to have underlying, long standing issues with constipation.  Have recommended use of daily Miralax, will trial this as Colace has not worked in past.  Consider Linzess if no benefit from [facility].        Fatigue    Denies any cardiac symptoms or SOB/orthopnea.  Recent initiation of treatment for Vit B12 and D deficiency.  Will recheck levels today.  Consider higher dose Vit D and B12 injections if levels remain low.  H/O thyroid nodule, check TSH today.  Will also check anemia panel, CBC, and CMP.  Patient is morbidly obese and continues to work 5 days a week, she is currently focused on retiring which may benefit health.  Relevant Orders   CBC with Differential/Platelet   Anemia panel   Comprehensive metabolic panel   Vitamin F35 deficiency    B12 level today,  Adjust supplement as needed.      Relevant Orders   Vitamin B12   Vitamin D deficiency    Vitamin D level today.  Adjust supplement as needed.      Relevant Orders   VITAMIN D 25 Hydroxy (Vit-D Deficiency, Fractures)       Follow up plan: Return in about 4 weeks (around 07/06/2018) for Fatigue .

## 2018-06-08 NOTE — Assessment & Plan Note (Signed)
Vitamin D level today.  Adjust supplement as needed.

## 2018-06-08 NOTE — Assessment & Plan Note (Signed)
Followed by Dr. McQueen.  Due to continued c/o fatigue will obtain TSH today. 

## 2018-06-09 LAB — ANEMIA PANEL
FERRITIN: 223 ng/mL — AB (ref 15–150)
FOLATE, HEMOLYSATE: 388 ng/mL
Folate, RBC: 884 ng/mL (ref >498)
Hematocrit: 43.9 % (ref 34.0–46.6)
Iron Saturation: 36 % (ref 15–55)
Iron: 109 ug/dL (ref 27–139)
RETIC CT PCT: 1.5 % (ref 0.6–2.6)
Total Iron Binding Capacity: 307 ug/dL (ref 250–450)
UIBC: 198 ug/dL (ref 118–369)
VITAMIN B 12: 660 pg/mL (ref 232–1245)

## 2018-06-09 LAB — COMPREHENSIVE METABOLIC PANEL
ALBUMIN: 4.3 g/dL (ref 3.7–4.7)
ALK PHOS: 87 IU/L (ref 39–117)
ALT: 15 IU/L (ref 0–32)
AST: 21 IU/L (ref 0–40)
Albumin/Globulin Ratio: 1.7 (ref 1.2–2.2)
BUN / CREAT RATIO: 17 (ref 12–28)
BUN: 14 mg/dL (ref 8–27)
Bilirubin Total: 0.4 mg/dL (ref 0.0–1.2)
CALCIUM: 10.2 mg/dL (ref 8.7–10.3)
CO2: 16 mmol/L — AB (ref 20–29)
CREATININE: 0.84 mg/dL (ref 0.57–1.00)
Chloride: 102 mmol/L (ref 96–106)
GFR calc Af Amer: 78 mL/min/{1.73_m2} (ref >59)
GFR, EST NON AFRICAN AMERICAN: 67 mL/min/{1.73_m2} (ref >59)
GLOBULIN, TOTAL: 2.6 g/dL (ref 1.5–4.5)
GLUCOSE: 57 mg/dL — AB (ref 65–99)
Potassium: 4.2 mmol/L (ref 3.5–5.2)
SODIUM: 141 mmol/L (ref 134–144)
Total Protein: 6.9 g/dL (ref 6.0–8.5)

## 2018-06-09 LAB — CBC WITH DIFFERENTIAL/PLATELET
BASOS: 0 %
Basophils Absolute: 0 10*3/uL (ref 0.0–0.2)
EOS (ABSOLUTE): 0 10*3/uL (ref 0.0–0.4)
EOS: 1 %
HEMOGLOBIN: 15 g/dL (ref 11.1–15.9)
IMMATURE GRANULOCYTES: 1 %
Immature Grans (Abs): 0 10*3/uL (ref 0.0–0.1)
LYMPHS: 42 %
Lymphocytes Absolute: 1.7 10*3/uL (ref 0.7–3.1)
MCH: 30.8 pg (ref 26.6–33.0)
MCHC: 34.2 g/dL (ref 31.5–35.7)
MCV: 90 fL (ref 79–97)
Monocytes Absolute: 0.4 10*3/uL (ref 0.1–0.9)
Monocytes: 9 %
Neutrophils Absolute: 2 10*3/uL (ref 1.4–7.0)
Neutrophils: 47 %
Platelets: 229 10*3/uL (ref 150–450)
RBC: 4.87 x10E6/uL (ref 3.77–5.28)
RDW: 14 % (ref 11.7–15.4)
WBC: 4.1 10*3/uL (ref 3.4–10.8)

## 2018-06-09 LAB — TSH: TSH: 2.73 u[IU]/mL (ref 0.450–4.500)

## 2018-06-09 LAB — VITAMIN D 25 HYDROXY (VIT D DEFICIENCY, FRACTURES): Vit D, 25-Hydroxy: 26.4 ng/mL — ABNORMAL LOW (ref 30.0–100.0)

## 2018-06-12 ENCOUNTER — Other Ambulatory Visit: Payer: Self-pay | Admitting: Nurse Practitioner

## 2018-06-12 MED ORDER — CHOLECALCIFEROL 1.25 MG (50000 UT) PO CAPS: 50000.0000 [IU] | ORAL_CAPSULE | ORAL | 0 refills | Status: DC

## 2018-06-12 NOTE — Progress Notes (Signed)
Pt states that she was needing the B12 rx called in.

## 2018-06-12 NOTE — Telephone Encounter (Signed)
Patient is calling back to state that she is requesting B-3 instead of B-12 to be called in. Thank you.

## 2018-06-12 NOTE — Progress Notes (Signed)
Spoke to patient via telephone and informed her to stop taking her daily Vitamin D and will send in script for stronger Vitamin D she will take weekly for 6 weeks and then she can return to her daily dose.  Will recheck her Vitamin D level at next visit.

## 2018-06-16 ENCOUNTER — Other Ambulatory Visit: Payer: Self-pay | Admitting: Nurse Practitioner

## 2018-06-16 ENCOUNTER — Telehealth: Payer: Self-pay | Admitting: Nurse Practitioner

## 2018-06-16 MED ORDER — BUPROPION HCL ER (SR) 150 MG PO TB12: 150.0000 mg | ORAL_TABLET | Freq: Two times a day (BID) | ORAL | 3 refills | Status: DC

## 2018-06-16 NOTE — Progress Notes (Signed)
Refill for Wellbutrin sent.

## 2018-06-16 NOTE — Telephone Encounter (Signed)
Requested Prescriptions  Pending Prescriptions Disp Refills  . buPROPion (WELLBUTRIN SR) 150 MG 12 hr tablet [Pharmacy Med Name: BUPROPION SR 150MG  TABLETS (12 H)] 60 tablet 0    Sig: TAKE 1 TABLET BY MOUTH EVERY 12 HOURS     Psychiatry: Antidepressants - bupropion Passed - 06/16/2018  7:54 AM      Passed - Completed PHQ-2 or PHQ-9 in the last 360 days.      Passed - Last BP in normal range    BP Readings from Last 1 Encounters:  06/08/18 138/78         Passed - Valid encounter within last 6 months    Recent Outpatient Visits          1 week ago Right lower quadrant abdominal pain   [facility] [place], Dorie Rank, NP   3 months ago Annual physical exam   [facility] [place], Corrie Dandy T, NP   5 months ago Essential hypertension, benign   [facility] [place], Adriana M, PA-C   6 months ago Essential hypertension, benign   [facility] [place], Elnita Maxwell, NP   10 months ago Moderate persistent asthma with acute exacerbation   [facility] Gabriel Cirri, NP      Future Appointments            In 3 weeks Cannady, Dorie Rank, NP [facility]   In 2 months [place], Dorie Rank, NP [facility]

## 2018-07-07 ENCOUNTER — Ambulatory Visit: Payer: BC Managed Care – PPO | Admitting: Nurse Practitioner

## 2018-07-13 ENCOUNTER — Ambulatory Visit: Payer: BC Managed Care – PPO | Admitting: Nurse Practitioner

## 2018-07-16 ENCOUNTER — Ambulatory Visit: Payer: BC Managed Care – PPO | Admitting: Nurse Practitioner

## 2018-07-19 ENCOUNTER — Other Ambulatory Visit: Payer: Self-pay | Admitting: Family Medicine

## 2018-07-28 ENCOUNTER — Ambulatory Visit: Payer: Self-pay | Admitting: Pharmacist

## 2018-07-28 DIAGNOSIS — I10 Essential (primary) hypertension: Secondary | ICD-10-CM

## 2018-07-28 NOTE — Progress Notes (Addendum)
  Chronic Care Management   Note  07/28/2018 Name: [name] MRN: [id number] DOB: 1941/05/08  Patient referred by Aura Dials, NP, for chronic care management for HTN, HLD, GERD, depression/anxiety. Contacted patient to explain chronic care management program; patient willing to come to clinic for meeting with full care team. Patient wanted to discuss scheduling with her daughter this afternoon; will call back to schedule an appointment.    Follow up plan: - Will follow up with patient if I have not heard back within 5-7 business days.   Catie Feliz Beam, PharmD Clinical Pharmacist [facility] [phone]

## 2018-08-03 ENCOUNTER — Other Ambulatory Visit: Payer: Self-pay | Admitting: Unknown Physician Specialty

## 2018-08-03 NOTE — Telephone Encounter (Signed)
Refill on Wixela sent.

## 2018-08-03 NOTE — Telephone Encounter (Signed)
Requested medication (s) are due for refill today: Yes  Requested medication (s) are on the active medication list: Yes  Last refill:  10 months ago by historical provider  Future visit scheduled: Yes  Notes to clinic: Unable to refill due to last refilled by another provider.     Requested Prescriptions  Pending Prescriptions Disp Refills   WIXELA INHUB 250-50 MCG/DOSE AEPB [Pharmacy Med Name: WIXELA INHUB DISKUS 250/50MCG 60S] 60 each     Sig: INHALE 1 PUFF INTO THE LUNGS TWICE DAILY     Pulmonology:  [employer] Passed - 08/03/2018  5:39 AM      Passed - Valid encounter within last 12 months    Recent Outpatient Visits          1 month ago Right lower quadrant abdominal pain   [facility] [place], Dorie Rank, NP   4 months ago Annual physical exam   [facility] [place], Corrie Dandy T, NP   7 months ago Essential hypertension, benign   [facility] [place], Adriana M, PA-C   8 months ago Essential hypertension, benign   [facility] [place], Elnita Maxwell, NP   11 months ago Moderate persistent asthma with acute exacerbation   [facility] Gabriel Cirri, NP      Future Appointments            In 4 weeks Cannady, Dorie Rank, NP [facility]

## 2018-08-04 ENCOUNTER — Telehealth: Payer: Self-pay | Admitting: Nurse Practitioner

## 2018-08-04 ENCOUNTER — Ambulatory Visit: Payer: Self-pay

## 2018-08-04 MED ORDER — PREDNISONE 10 MG PO TABS: 30.0000 mg | ORAL_TABLET | Freq: Every day | ORAL | 0 refills | Status: AC

## 2018-08-04 MED ORDER — DOXYCYCLINE HYCLATE 100 MG PO TABS: 100.0000 mg | ORAL_TABLET | Freq: Two times a day (BID) | ORAL | 0 refills | Status: AC

## 2018-08-04 NOTE — Telephone Encounter (Signed)
Pt with productive cough x 1 week. Pt is coughing up thick "golden yellow" phlegm. Pt denies fever or any SOB or wheezing. Pt with h/o asthma and "beginning stages of COPD." Care advice given to pt and pt refused appt. Pt stated that she wants just to send note to Aura Dials DNP for advice. Pt stated she was afraid to come in to office.  Routing note to [employer].   Reason for Disposition . [1] Known COPD or other severe lung disease (i.e., bronchiectasis, cystic fibrosis, lung surgery) AND [2] worsening symptoms (i.e., increased sputum purulence or amount, increased breathing difficulty  Answer Assessment - Initial Assessment Questions 1. ONSET: "When did the cough begin?"      1 week 2. SEVERITY: "How bad is the cough today?"  Coughing episodes 5 to 6 times times 3. RESPIRATORY DISTRESS: "Describe your breathing."      no 4. FEVER: "Do you have a fever?" If so, ask: "What is your temperature, how was it measured, and when did it start?"     no 5. SPUTUM: "Describe the color of your sputum" (clear, white, yellow, green)     Thick yellow taking Delsym 6. HEMOPTYSIS: "Are you coughing up any blood?" If so ask: "How much?" (flecks, streaks, tablespoons, etc.)     no 7. CARDIAC HISTORY: "Do you have any history of heart disease?" (e.g., heart attack, congestive heart failure)      no 8. LUNG HISTORY: "Do you have any history of lung disease?"  (e.g., pulmonary embolus, asthma, emphysema)     H/o asthma takes 2 inhalers- has had pneumonia twice 9. PE RISK FACTORS: "Do you have a history of blood clots?" (or: recent major surgery, recent prolonged travel, bedridden)     no 10. OTHER SYMPTOMS: "Do you have any other symptoms?" (e.g., runny nose, wheezing, chest pain)       productive cough 11. PREGNANCY: "Is there any chance you are pregnant?" "When was your last menstrual period?"       n/a 12. TRAVEL: "Have you traveled out of the country in the last month?" (e.g., travel history,  exposures)       No  Protocols used: COUGH - ACUTE PRODUCTIVE-A-AH

## 2018-08-04 NOTE — Telephone Encounter (Signed)
Spoke to patient via telephone she reports she has been coughing yellow sputum up, similar to this time last year when she had asthma exacerbation.  She is taking her Spiriva and Wixela daily as maintenance, but has not been using Albuterol.  She does not want to come into office or go to urgent care as she is "afraid" with "COVID 19 out there and my age".  She denies fever, sore throat, hemoptysis, SOB, CP.  Reports rhinorrhea, no congestion.  States productive cough started Saturday.  Would like medication called in as she is fearful of leaving home.  Discussed that abx and Prednisone will be called in at this time.  However, contracted with patient that if symptoms worsen or no improvement with treatment she is immediately to come into office for further evaluation.  She agreed with this POC.  Recommend she use Albuterol as needed if any SOB or wheezing presents.

## 2018-08-25 ENCOUNTER — Other Ambulatory Visit: Payer: Self-pay | Admitting: Nurse Practitioner

## 2018-08-25 DIAGNOSIS — E559 Vitamin D deficiency, unspecified: Secondary | ICD-10-CM

## 2018-08-25 NOTE — Progress Notes (Signed)
Lab orders only 

## 2018-08-26 ENCOUNTER — Other Ambulatory Visit: Payer: Self-pay | Admitting: Unknown Physician Specialty

## 2018-08-28 ENCOUNTER — Other Ambulatory Visit: Payer: BC Managed Care – PPO

## 2018-08-28 ENCOUNTER — Other Ambulatory Visit: Payer: Self-pay

## 2018-08-28 DIAGNOSIS — E559 Vitamin D deficiency, unspecified: Secondary | ICD-10-CM

## 2018-08-29 LAB — VITAMIN D 25 HYDROXY (VIT D DEFICIENCY, FRACTURES): Vit D, 25-Hydroxy: 38.9 ng/mL (ref 30.0–100.0)

## 2018-08-31 ENCOUNTER — Ambulatory Visit (INDEPENDENT_AMBULATORY_CARE_PROVIDER_SITE_OTHER): Payer: BC Managed Care – PPO | Admitting: Nurse Practitioner

## 2018-08-31 ENCOUNTER — Encounter: Payer: Self-pay | Admitting: Nurse Practitioner

## 2018-08-31 VITALS — BP 143/80 | HR 57 | Temp 97.7°F

## 2018-08-31 DIAGNOSIS — I890 Lymphedema, not elsewhere classified: Secondary | ICD-10-CM | POA: Diagnosis not present

## 2018-08-31 DIAGNOSIS — E785 Hyperlipidemia, unspecified: Secondary | ICD-10-CM | POA: Diagnosis not present

## 2018-08-31 DIAGNOSIS — I10 Essential (primary) hypertension: Secondary | ICD-10-CM | POA: Diagnosis not present

## 2018-08-31 DIAGNOSIS — E559 Vitamin D deficiency, unspecified: Secondary | ICD-10-CM | POA: Diagnosis not present

## 2018-08-31 MED ORDER — NEBIVOLOL HCL 5 MG PO TABS: 2.5000 mg | ORAL_TABLET | Freq: Every day | ORAL | 12 refills | Status: DC

## 2018-08-31 NOTE — Progress Notes (Signed)
BP (!) 143/80 Comment: pt reported- virtual visit  Pulse (!) 57 Comment: pt reported- virtual visit  Temp 97.7 F (36.5 C) (Oral) Comment: pt reported- virtual visit  LMP  (LMP Unknown)   SpO2 98% Comment: pt reported- virtual visit   Subjective:    Patient ID: [name], female    DOB: 04/25/1941, 78 y.o.   MRN: [id number] [name]  HPI: [name] is a 78 y.o. female  Chief Complaint  Patient presents with  . Hypertension  . Lymphedema    . This visit was completed via WebEx due to the restrictions of the COVID-19 pandemic. All issues as above were discussed and addressed. Physical exam was done as above through visual confirmation on WebEx. If it was felt that the patient should be evaluated in the office, they were directed there. The patient verbally consented to this visit. . Location of the patient: home . Location of the provider: home . Those involved with this call:  . Provider: Aura DialsJolene Cannady, DNP, AGPCNP-C . CMA: Wilhemena DurieBrittany Russell, CMA . Front Desk/Registration: Harriet PhoJoliza Johnson  . Time spent on call: 15 minutes with patient face to face via video conference. More than 50% of this time was spent in counseling and coordination of care. 10 minutes total spent in review of patient's record and preparation of their chart.  HYPERTENSION / HYPERLIPIDEMIA Continues on Irbesartan, Bystolic (only take 1/2 tablet once a day -- ordered twice a day), and Lasix for HTN + Lipitor for HLD. Satisfied with current treatment? yes Duration of hypertension: chronic BP monitoring frequency: daily BP range: 120-130/70-80 range BP medication side effects: no Duration of hyperlipidemia: chronic Cholesterol medication side effects: no Cholesterol supplements: none Medication compliance: good compliance Aspirin: yes Recent stressors: no Recurrent headaches: no Visual changes: no Palpitations: no Dyspnea: no Chest pain: no Lower extremity edema: yes, per baseline, not worse  Dizzy/lightheaded: no    LYMPHEDEMA: Followed by physical therapy for treatment, massage.  Currently not going as PT office is closed due to COVID 19.  She reports no wounds or skin breakdown.  VITAMIN D DEFICIENCY: Continues on daily supplement, was initially treated with weekly dosing due to previous Vit D level 26.4 and current level 38.9.  Denies any recent falls or fractures.  Relevant past medical, surgical, family and social history reviewed and updated as indicated. Interim medical history since our last visit reviewed. Allergies and medications reviewed and updated.  Review of Systems  Constitutional: Negative for activity change, appetite change, diaphoresis, fatigue and fever.  Respiratory: Negative for cough, chest tightness and shortness of breath.   Cardiovascular: Negative for chest pain, palpitations and leg swelling.  Gastrointestinal: Negative for abdominal distention, abdominal pain, constipation, diarrhea, nausea and vomiting.  Endocrine: Negative for cold intolerance, heat intolerance, polydipsia, polyphagia and polyuria.  Neurological: Negative for dizziness, syncope, weakness, light-headedness, numbness and headaches.  Psychiatric/Behavioral: Negative.     Per HPI unless specifically indicated above     Objective:    BP (!) 143/80 Comment: pt reported- virtual visit  Pulse (!) 57 Comment: pt reported- virtual visit  Temp 97.7 F (36.5 C) (Oral) Comment: pt reported- virtual visit  LMP  (LMP Unknown)   SpO2 98% Comment: pt reported- virtual visit  Wt Readings from Last 3 Encounters:  06/08/18 (!) 357 lb (161.9 kg)  03/09/18 (!) 353 lb (160.1 kg)  01/02/18 (!) 355 lb (161 kg)    Physical Exam Vitals signs and nursing note reviewed.  Constitutional:  General: She is awake.     Appearance: She is well-developed. She is obese.  HENT:     Head: Normocephalic.     Right Ear: Hearing normal.     Left Ear: Hearing normal.     Nose: Nose normal.  Eyes:      General: Lids are normal.        Right eye: No discharge.        Left eye: No discharge.     Conjunctiva/sclera: Conjunctivae normal.  Neck:     Musculoskeletal: Normal range of motion and neck supple.  Cardiovascular:     Comments: Unable to auscultate due to virtual visit. Pulmonary:     Effort: Pulmonary effort is normal.     Comments: Unable to auscultate due to virtual visit. Neurological:     Mental Status: She is alert and oriented to person, place, and time.  Psychiatric:        Attention and Perception: Attention normal.        Mood and Affect: Mood normal.        Behavior: Behavior normal. Behavior is cooperative.        Thought Content: Thought content normal.        Judgment: Judgment normal.     Results for orders placed or performed in visit on 08/28/18  VITAMIN D 25 Hydroxy (Vit-D Deficiency, Fractures)  Result Value Ref Range   Vit D, 25-Hydroxy 38.9 30.0 - 100.0 ng/mL      Assessment & Plan:   Problem List Items Addressed This Visit      Cardiovascular and Mediastinum   Essential hypertension, benign - Primary    Chronic, ongoing.  BP slightly elevated today, but woke patient up for visit and has not taken medications yet.  At baseline home BP within goal range.  Continue current medication regimen.        Relevant Medications   nebivolol (BYSTOLIC) 5 MG tablet     Other   Lymphedema    Chronic, ongoing.  Continue with physical therapy for massage treatment.      Hyperlipidemia    Chronic, ongoing.  Continue current medication regimen.  Labs next visit.      Relevant Medications   nebivolol (BYSTOLIC) 5 MG tablet   Vitamin D deficiency    Chronic, ongoing.  Continue current medication regimen, daily supplement.  Level improved.        I discussed the assessment and treatment plan with the patient. The patient was provided an opportunity to ask questions and all were answered. The patient agreed with the plan and demonstrated an  understanding of the instructions.   The patient was advised to call back or seek an in-person evaluation if the symptoms worsen or if the condition fails to improve as anticipated.   Follow up plan: Return in about 3 months (around 11/30/2018) for HTN/ HLD, thyroid, GERD.

## 2018-08-31 NOTE — Assessment & Plan Note (Signed)
Chronic, ongoing.  Continue current medication regimen.  Labs next visit. 

## 2018-08-31 NOTE — Patient Instructions (Signed)
DASH Eating Plan  DASH stands for "Dietary Approaches to Stop Hypertension." The DASH eating plan is a healthy eating plan that has been shown to reduce high blood pressure (hypertension). It may also reduce your risk for type 2 diabetes, heart disease, and stroke. The DASH eating plan may also help with weight loss.  What are tips for following this plan?    General guidelines   Avoid eating more than 2,300 mg (milligrams) of salt (sodium) a day. If you have hypertension, you may need to reduce your sodium intake to 1,500 mg a day.   Limit alcohol intake to no more than 1 drink a day for nonpregnant women and 2 drinks a day for men. One drink equals 12 oz of beer, 5 oz of wine, or 1 oz of hard liquor.   Work with your health care provider to maintain a healthy body weight or to lose weight. Ask what an ideal weight is for you.   Get at least 30 minutes of exercise that causes your heart to beat faster (aerobic exercise) most days of the week. Activities may include walking, swimming, or biking.   Work with your health care provider or diet and nutrition specialist (dietitian) to adjust your eating plan to your individual calorie needs.  Reading food labels     Check food labels for the amount of sodium per serving. Choose foods with less than 5 percent of the Daily Value of sodium. Generally, foods with less than 300 mg of sodium per serving fit into this eating plan.   To find whole grains, look for the word "whole" as the first word in the ingredient list.  Shopping   Buy products labeled as "low-sodium" or "no salt added."   Buy fresh foods. Avoid canned foods and premade or frozen meals.  Cooking   Avoid adding salt when cooking. Use salt-free seasonings or herbs instead of table salt or sea salt. Check with your health care provider or pharmacist before using salt substitutes.   Do not fry foods. Cook foods using healthy methods such as baking, boiling, grilling, and broiling instead.   Cook with  heart-healthy oils, such as olive, canola, soybean, or sunflower oil.  Meal planning   Eat a balanced diet that includes:  ? 5 or more servings of fruits and vegetables each day. At each meal, try to fill half of your plate with fruits and vegetables.  ? Up to 6-8 servings of whole grains each day.  ? Less than 6 oz of lean meat, poultry, or fish each day. A 3-oz serving of meat is about the same size as a deck of cards. One egg equals 1 oz.  ? 2 servings of low-fat dairy each day.  ? A serving of nuts, seeds, or beans 5 times each week.  ? Heart-healthy fats. Healthy fats called Omega-3 fatty acids are found in foods such as flaxseeds and coldwater fish, like sardines, salmon, and mackerel.   Limit how much you eat of the following:  ? Canned or prepackaged foods.  ? Food that is high in trans fat, such as fried foods.  ? Food that is high in saturated fat, such as fatty meat.  ? Sweets, desserts, sugary drinks, and other foods with added sugar.  ? Full-fat dairy products.   Do not salt foods before eating.   Try to eat at least 2 vegetarian meals each week.   Eat more home-cooked food and less restaurant, buffet, and fast food.     When eating at a restaurant, ask that your food be prepared with less salt or no salt, if possible.  What foods are recommended?  The items listed may not be a complete list. Talk with your dietitian about what dietary choices are best for you.  Grains  Whole-grain or whole-wheat bread. Whole-grain or whole-wheat pasta. Brown rice. Oatmeal. Quinoa. Bulgur. Whole-grain and low-sodium cereals. Pita bread. Low-fat, low-sodium crackers. Whole-wheat flour tortillas.  Vegetables  Fresh or frozen vegetables (raw, steamed, roasted, or grilled). Low-sodium or reduced-sodium tomato and vegetable juice. Low-sodium or reduced-sodium tomato sauce and tomato paste. Low-sodium or reduced-sodium canned vegetables.  Fruits  All fresh, dried, or frozen fruit. Canned fruit in natural juice (without  added sugar).  Meat and other protein foods  Skinless chicken or turkey. Ground chicken or turkey. Pork with fat trimmed off. Fish and seafood. Egg whites. Dried beans, peas, or lentils. Unsalted nuts, nut butters, and seeds. Unsalted canned beans. Lean cuts of beef with fat trimmed off. Low-sodium, lean deli meat.  Dairy  Low-fat (1%) or fat-free (skim) milk. Fat-free, low-fat, or reduced-fat cheeses. Nonfat, low-sodium ricotta or cottage cheese. Low-fat or nonfat yogurt. Low-fat, low-sodium cheese.  Fats and oils  Soft margarine without trans fats. Vegetable oil. Low-fat, reduced-fat, or light mayonnaise and salad dressings (reduced-sodium). Canola, safflower, olive, soybean, and sunflower oils. Avocado.  Seasoning and other foods  Herbs. Spices. Seasoning mixes without salt. Unsalted popcorn and pretzels. Fat-free sweets.  What foods are not recommended?  The items listed may not be a complete list. Talk with your dietitian about what dietary choices are best for you.  Grains  Baked goods made with fat, such as croissants, muffins, or some breads. Dry pasta or rice meal packs.  Vegetables  Creamed or fried vegetables. Vegetables in a cheese sauce. Regular canned vegetables (not low-sodium or reduced-sodium). Regular canned tomato sauce and paste (not low-sodium or reduced-sodium). Regular tomato and vegetable juice (not low-sodium or reduced-sodium). Pickles. Olives.  Fruits  Canned fruit in a light or heavy syrup. Fried fruit. Fruit in cream or butter sauce.  Meat and other protein foods  Fatty cuts of meat. Ribs. Fried meat. Bacon. Sausage. Bologna and other processed lunch meats. Salami. Fatback. Hotdogs. Bratwurst. Salted nuts and seeds. Canned beans with added salt. Canned or smoked fish. Whole eggs or egg yolks. Chicken or turkey with skin.  Dairy  Whole or 2% milk, cream, and half-and-half. Whole or full-fat cream cheese. Whole-fat or sweetened yogurt. Full-fat cheese. Nondairy creamers. Whipped toppings.  Processed cheese and cheese spreads.  Fats and oils  Butter. Stick margarine. Lard. Shortening. Ghee. Bacon fat. Tropical oils, such as coconut, palm kernel, or palm oil.  Seasoning and other foods  Salted popcorn and pretzels. Onion salt, garlic salt, seasoned salt, table salt, and sea salt. Worcestershire sauce. Tartar sauce. Barbecue sauce. Teriyaki sauce. Soy sauce, including reduced-sodium. Steak sauce. Canned and packaged gravies. Fish sauce. Oyster sauce. Cocktail sauce. Horseradish that you find on the shelf. Ketchup. Mustard. Meat flavorings and tenderizers. Bouillon cubes. Hot sauce and Tabasco sauce. Premade or packaged marinades. Premade or packaged taco seasonings. Relishes. Regular salad dressings.  Where to find more information:   National Heart, Lung, and Blood Institute: www.nhlbi.nih.gov   American Heart Association: www.heart.org  Summary   The DASH eating plan is a healthy eating plan that has been shown to reduce high blood pressure (hypertension). It may also reduce your risk for type 2 diabetes, heart disease, and stroke.   With the   DASH eating plan, you should limit salt (sodium) intake to 2,300 mg a day. If you have hypertension, you may need to reduce your sodium intake to 1,500 mg a day.   When on the DASH eating plan, aim to eat more fresh fruits and vegetables, whole grains, lean proteins, low-fat dairy, and heart-healthy fats.   Work with your health care provider or diet and nutrition specialist (dietitian) to adjust your eating plan to your individual calorie needs.  This information is not intended to replace advice given to you by your health care provider. Make sure you discuss any questions you have with your health care provider.  Document Released: 04/25/2011 Document Revised: 04/29/2016 Document Reviewed: 04/29/2016  Elsevier Interactive Patient Education  2019 [employer] Inc.

## 2018-08-31 NOTE — Assessment & Plan Note (Signed)
Chronic, ongoing.  Continue current medication regimen, daily supplement.  Level improved.

## 2018-08-31 NOTE — Assessment & Plan Note (Signed)
Chronic, ongoing.  BP slightly elevated today, but woke patient up for visit and has not taken medications yet.  At baseline home BP within goal range.  Continue current medication regimen.

## 2018-08-31 NOTE — Assessment & Plan Note (Signed)
Chronic, ongoing.  Continue with physical therapy for massage treatment.

## 2018-09-01 ENCOUNTER — Ambulatory Visit: Payer: Self-pay | Admitting: Pharmacist

## 2018-09-01 ENCOUNTER — Telehealth: Payer: Self-pay

## 2018-09-01 DIAGNOSIS — I10 Essential (primary) hypertension: Secondary | ICD-10-CM

## 2018-09-01 NOTE — Chronic Care Management (AMB) (Signed)
  Chronic Care Management   Note  09/01/2018 Name: [name] MRN: [id number] DOB: 1940-07-25  Patient is a 78 year old female followed by Aura Dials, NP for primary care referred to chronic care management for support in managing hypertension, hyperlipidemia, and depression.   Previously, we had an office appointment scheduled today, but this was changed to telephonic due to the coronavirus pandemic. Contacted patient to discuss CCM program, left HIPAA compliant message for patient to return my call.    Follow up plan: - If I have not heard back, will follow up with the patient in 3-5 business days.   Catie Feliz Beam, PharmD Clinical Pharmacist [facility] [phone]

## 2018-09-02 ENCOUNTER — Telehealth: Payer: BC Managed Care – PPO

## 2018-09-04 ENCOUNTER — Ambulatory Visit (INDEPENDENT_AMBULATORY_CARE_PROVIDER_SITE_OTHER): Payer: BC Managed Care – PPO | Admitting: Pharmacist

## 2018-09-04 ENCOUNTER — Telehealth: Payer: Self-pay

## 2018-09-04 DIAGNOSIS — J4541 Moderate persistent asthma with (acute) exacerbation: Secondary | ICD-10-CM | POA: Diagnosis not present

## 2018-09-04 DIAGNOSIS — I10 Essential (primary) hypertension: Secondary | ICD-10-CM | POA: Diagnosis not present

## 2018-09-04 DIAGNOSIS — I890 Lymphedema, not elsewhere classified: Secondary | ICD-10-CM

## 2018-09-04 DIAGNOSIS — E785 Hyperlipidemia, unspecified: Secondary | ICD-10-CM

## 2018-09-04 DIAGNOSIS — F3340 Major depressive disorder, recurrent, in remission, unspecified: Secondary | ICD-10-CM | POA: Diagnosis not present

## 2018-09-04 NOTE — Chronic Care Management (AMB) (Signed)
Chronic Care Management   Note  09/04/2018 Name: [name] MRN: [id number] DOB: 1940/07/05   Subjective:  Patient is a 78 year old female followed by [name], NP for primary care services, referred to chronic care management for support in managing hypertension, lymphedema, depression, and ashtma.   Ms. Cerro was given information about Chronic Care Management services today including:  1. CCM service includes personalized support from designated clinical staff supervised by her physician, including individualized plan of care and coordination with other care providers 2. 24/7 contact phone numbers for assistance for urgent and routine care needs. 3. Service will only be billed when office clinical staff spend 20 minutes or more in a month to coordinate care. 4. Only one practitioner may furnish and bill the service in a calendar month. 5. The patient may stop CCM services at any time (effective at the end of the month) by phone call to the office staff. 6. The patient will be responsible for cost sharing (co-pay) of up to 20% of the service fee (after annual deductible is met).  Patient agreed to services and verbal consent obtained.   Review of patient status, including review of consultants reports, laboratory and other test data, was performed as part of comprehensive evaluation and provision of chronic care management services.   Objective: Lab Results  Component Value Date   CREATININE 0.84 06/08/2018   CREATININE 0.86 03/09/2018   CREATININE 0.82 02/12/2017    Lab Results  Component Value Date   HGBA1C 5.4 03/09/2018    Lipid Panel     Component Value Date/Time   CHOL 224 (H) 03/09/2018 1142   CHOL 213 (H) 02/12/2017 0939   TRIG 96 03/09/2018 1142   TRIG 115 02/12/2017 0939   HDL 94 03/09/2018 1142   CHOLHDL 2.4 03/09/2018 1142   VLDL 23 02/12/2017 0939   LDLCALC 111 (H) 03/09/2018 1142    BP Readings from Last 3 Encounters:  08/31/18 (!) 143/80   06/08/18 138/78  03/09/18 104/68    Allergies  Allergen Reactions  . Latex Rash  . Benicar [Olmesartan] Swelling  . Ciprofloxacin Other (See Comments)  . Diovan [Valsartan] Other (See Comments)    fatigue  . Flagyl [Metronidazole]   . Lotensin [Benazepril Hcl] Cough  . Penicillins   . Sulfa Antibiotics     Medications Reviewed Today    Reviewed by De Hollingshead, [facility] (Pharmacist) on 09/04/18 at [place] List Status: <None>  Medication Order Taking? Sig Documenting Provider Last Dose Status Informant  albuterol (PROVENTIL) (5 MG/ML) 0.5% nebulizer solution [id number] No Take 0.5 mLs (2.5 mg total) by nebulization every 6 (six) hours as needed for wheezing or shortness of breath.  Patient not taking:  Reported on 09/04/2018   Volney American, PA-C Not Taking Active   Apple Cider Vinegar 500 MG TABS [id number] Yes Take 500 mg by mouth 4 (four) times daily. [provider] Taking Active   aspirin 81 MG tablet [id number] Yes Take 81 mg by mouth daily. [provider] Taking Active   atorvastatin (LIPITOR) 10 MG tablet [id number] No TAKE 1 TABLET(10 MG) BY MOUTH DAILY  Patient not taking:  Reported on 09/04/2018   [name] T, NP Not Taking Active   buPROPion [facility] SR) 150 MG 12 hr tablet [id number] Yes Take 1 tablet (150 mg total) by mouth every 12 (twelve) hours. Venita Lick, NP Taking Active            Med Note (  Dorsey Charette E   Fri Sep 04, 2018  9:58 AM) Taking QAM; taking evening dose keeps her awake at night  Cholecalciferol (VITAMIN D-3 PO) [id number] Yes Take 2,000 Units by mouth 2 (two) times daily.  [provider] Taking Active   furosemide (LASIX) 40 MG tablet [id number] No Take 1 tablet (40 mg total) by mouth daily as needed.  Patient not taking:  Reported on 09/04/2018   Kathrine Haddock, NP Not Taking Active   Glucos-Chond-Hyal Ac-Ca Fructo ([place]) [id number] Yes Take by mouth daily. [provider] Taking Active   irbesartan (AVAPRO) 300 MG tablet [id number] Yes TAKE 1/2 TABLET(150 MG) BY MOUTH DAILY Cannady, [name] T, NP Taking Active            Med Note Darnelle Maffucci, Rudy Domek E   Fri Sep 04, 2018  9:47 AM) AM  magnesium oxide (MAG-OX) 400 MG tablet [id number] Yes Take 400 mg by mouth daily. [provider] Taking Active Self  mupirocin cream (BACTROBAN) 2 % [id number] Yes APPLY EXTERNALLY TO THE AFFECTED AREA TWICE DAILY Crissman, Jeannette How, MD Taking Active   nebivolol (BYSTOLIC) 5 MG tablet [id number] Yes Take 0.5 tablets (2.5 mg total) by mouth daily. Venita Lick, NP Taking Active            Med Note Darnelle Maffucci, Cian Costanzo E   Fri Sep 04, 2018  9:47 AM) HS  omeprazole (PRILOSEC) 20 MG capsule [id number] Yes TAKE 1 CAPSULE(20 MG) BY MOUTH DAILY Cannady, [name] T, NP Taking Active   Probiotic Product (PROBIOTIC PO) [id number] Yes Take 1 tablet by mouth daily. [provider] Taking Active   Silver-Carboxymethylcellulose (AQUACEL-AG EXTRA HYDROFIBER) 2"X2" PADS [id number]  Use daily prn Kathrine Haddock, NP  Active   Tiotropium Bromide Monohydrate (SPIRIVA RESPIMAT) 1.25 MCG/ACT AERS [id number] Yes Inhale 2 puffs into the lungs daily. Flora Lipps, MD Taking Active   UNABLE TO FIND [id number] Yes Take 1 tablet by mouth 2 (two) times daily. Vitmain B12- 2000 units Folate- 2000 mg Vitamin B6- 2 mg [provider] Taking Active   vitamin E 400 UNIT capsule [id number] Yes Take 400 Units by mouth daily. [provider] Taking Active         Discontinued 09/04/18 1006 (Completed Course)   Grant Ruts INHUB 250-50 MCG/DOSE AEPB [id number] Yes INHALE 1 PUFF INTO THE LUNGS TWICE DAILY Cannady, Barbaraann Faster, NP Taking Active            Assessment:   Goals Addressed            This Visit's Progress     Patient Stated   . "I have a problem with my pulse dropping and I get dizzy" (pt-stated)       Current Barriers:  . Hx bradycardia, exacerbated by beta blocker  use; does not appear she discussed this concern with PCP during visit on 08/31/2018 . Taking nebivolol 2.5 mg QHS; additionally on irbesartan 150 mg daily . Per chart review, no specific indication for beta blocker therapy o Last cardiology visit with [name] on 08/12/2016, he noted that she "likely doesn't need beta blocker therapy"  Pharmacist Clinical Goal(s):  Marland Kitchen Over the next 14 days, patient will work with PharmD and PCP to address needs related to optimal hypertension management  Interventions: . Comprehensive medication review and history performed . Will collaborate with [name], NP; could consider discontinuation of nebivolol and consider increasing irbesartan in the future if needed for BP control  Patient  Self Care Activities:  . Self administers medications as prescribed . Calls pharmacy for medication refills  Initial goal documentation     . "I take a lot of medications and I'm not sure I'm using them correctly" (pt-stated)       Current Barriers:  Marland Kitchen Knowledge Deficits related to appropriate medication management  o Patient unsure if she is supposed to continue vitamin D 2000 IU BID, daily, or discontinue; last Vitamin D level 38.9 ng/mL. She has been taking 2000 IU daily o Patient reports "leg muscle pain", and is unsure if related to atorvastatin; notes she has been on atorvastatin for >1 year, but this "leg pain" has only occurred in the past few months. Reports that she'll occasionally stop atorvastatin, but "pain" doesn't resolve/improve. Notes that "pain" occurs during the night, and that if she sleeps on her side, "pain" improves.  o Patient notes that she takes bupropion SR only once daily; if she takes her evening dose, it keeps her awake; she asks if there would be a way to split bupropion and take 1/2 tab in the afternoon. o Patient taking diphenhydramine every evening to help with leg itching/sleep  Pharmacist Clinical Goal(s):  Marland Kitchen Over the next 30 days,  patient will demonstrate improved medication adherence as evidenced by taking atorvastatin daily  . Over the next 14 days, patient will work with PCP and CCM team to improve appropriate use of safe and effective medications  Interventions: . Comprehensive medication review performed. . Collaboration with provider re: medication management on the following:  o Determination if patient should continue Vitmain D 2000 IU daily, resume BID, or discontinue. Likely, continuing 1000-2000 IU daily is appropriate to maintaining goal levels o Extensive counseling on typical muscle associate side effects, explained that her complaints do not sound like this. Encouraged to restart atorvastatin 10 mg daily; if symptoms continue/worsen, could consider a more hydrophilic statin such as rosuvastatin, d/t lower incidence of muscle effects o Bupropion does come as immediate release 75 or 100 mg tablets. Could consider changing from 150 mg SR QAM to 75 or 100 mg BID (QAM and then in the afternoon); further evaluation of current depressive control is likely also warranted o Extensive counseling on anticholinergic side effects of diphenhydramine and [place] status. Encouraged patient to try OTC melatonin QHS instead of nightly diphenhydramine. Will collaborate with provider regarding "leg itching" and if daily antihistamine would be appropriate  Patient Self Care Activities:  . Does not self-administer prescription medications exactly as prescribed   Initial goal documentation     . "My lyphedema is bad, my legs are itchy and swollen" (pt-stated)       Current Barriers:  . Unable to attend massage therapy for lymphedema d/t coronavirus pandemic o Patient notes she has not been taking furosemide for swelling due to concerns of dehydration, but notes she will likely start taking PRN as she notes more swelling o Also notes concerns with itching - taking diphenhydramine QHS to help with leg itching and sleeping   Pharmacist Clinical Goal(s):  Marland Kitchen Over the next 30 days, patient will work with PCP and CCM team to address needs related to management of lymphedema  Interventions: . Advised patient to trial furosemide as needed for swelling, as prescribed  . Will ask CCM RN Janci Minor to reach out to patient to discuss strategies for self-management  Patient Self Care Activities:  . Patient wears compression hose daily as directed  Initial goal documentation  Plan: - Will communicate RN and LCSW needs to CCM colleagues - Will collaborate with PCP on medication management needs/suggestions highlighted above  Catie Darnelle Maffucci, PharmD Clinical Pharmacist [place] [phone]

## 2018-09-04 NOTE — Telephone Encounter (Signed)
Called patient from recall.  Reviewed telehealth visits with patient.  Patient denied any symptoms and would like to be seen in office when we reopen.  Made patient aware that there is always someone in the office and if she has any symptoms to please call.  Recall placed for August.

## 2018-09-04 NOTE — Patient Instructions (Signed)
Visit Information  Goals Addressed            This Visit's Progress     Patient Stated   . "I have a problem with my pulse dropping and I get dizzy" (pt-stated)       Current Barriers:  . Hx bradycardia, exacerbated by beta blocker use; does not appear she discussed this concern with PCP during visit on 08/31/2018 . Taking nebivolol 2.5 mg QHS; additionally on irbesartan 150 mg daily . Per chart review, no specific indication for beta blocker therapy o Last cardiology visit with [name] on 08/12/2016, he noted that she "likely doesn't need beta blocker therapy"  Pharmacist Clinical Goal(s):  Marland Kitchen Over the next 14 days, patient will work with PharmD and PCP to address needs related to optimal hypertension management  Interventions: . Comprehensive medication review and history performed . Will collaborate with [name], NP; could consider discontinuation of nebivolol and consider increasing irbesartan in the future if needed for BP control  Patient Self Care Activities:  . Self administers medications as prescribed . Calls pharmacy for medication refills  Initial goal documentation     . "I take a lot of medications and I'm not sure I'm using them correctly" (pt-stated)       Current Barriers:  Marland Kitchen Knowledge Deficits related to appropriate medication management  o Patient unsure if she is supposed to continue vitamin D 2000 IU BID, daily, or discontinue; last Vitamin D level 38.9 ng/mL. She has been taking 2000 IU daily o Patient reports "leg muscle pain", and is unsure if related to atorvastatin; notes she has been on atorvastatin for >1 year, but this "leg pain" has only occurred in the past few months. Reports that she'll occasionally stop atorvastatin, but "pain" doesn't resolve/improve. Notes that "pain" occurs during the night, and that if she sleeps on her side, "pain" improves.  o Patient notes that she takes bupropion SR only once daily; if she takes her evening dose, it  keeps her awake; she asks if there would be a way to split bupropion and take 1/2 tab in the afternoon. o Patient taking diphenhydramine every evening to help with leg itching/sleep  Pharmacist Clinical Goal(s):  Marland Kitchen Over the next 30 days, patient will demonstrate improved medication adherence as evidenced by taking atorvastatin daily  . Over the next 14 days, patient will work with PCP and CCM team to improve appropriate use of safe and effective medications  Interventions: . Comprehensive medication review performed. . Collaboration with provider re: medication management on the following:  o Determination if patient should continue Vitmain D 2000 IU daily, resume BID, or discontinue. Likely, continuing 1000-2000 IU daily is appropriate to maintaining goal levels o Extensive counseling on typical muscle associate side effects, explained that her complaints do not sound like this. Encouraged to restart atorvastatin 10 mg daily; if symptoms continue/worsen, could consider a more hydrophilic statin such as rosuvastatin, d/t lower incidence of muscle effects o Bupropion does come as immediate release 75 or 100 mg tablets. Could consider changing from 150 mg SR QAM to 75 or 100 mg BID (QAM and then in the afternoon); further evaluation of current depressive control is likely also warranted o Extensive counseling on anticholinergic side effects of diphenhydramine and [place] status. Encouraged patient to try OTC melatonin QHS instead of nightly diphenhydramine. Will collaborate with provider regarding "leg itching" and if daily antihistamine would be appropriate  Patient Self Care Activities:  . Does not self-administer prescription  medications exactly as prescribed   Initial goal documentation     . "My lyphedema is bad, my legs are itchy and swollen" (pt-stated)       Current Barriers:  . Unable to attend massage therapy for lymphedema d/t coronavirus pandemic o Patient notes she has not been  taking furosemide for swelling due to concerns of dehydration, but notes she will likely start taking PRN as she notes more swelling o Also notes concerns with itching - taking diphenhydramine QHS to help with leg itching and sleeping  Pharmacist Clinical Goal(s):  Marland Kitchen Over the next 30 days, patient will work with PCP and CCM team to address needs related to management of lymphedema  Interventions: . Advised patient to trial furosemide as needed for swelling, as prescribed  . Will ask CCM RN Janci Minor to reach out to patient to discuss strategies for self-management  Patient Self Care Activities:  . Patient wears compression hose daily as directed  Initial goal documentation        Ms. Fecteau was given information about Chronic Care Management services today including:  1. CCM service includes personalized support from designated clinical staff supervised by her physician, including individualized plan of care and coordination with other care providers 2. 24/7 contact phone numbers for assistance for urgent and routine care needs. 3. Service will only be billed when office clinical staff spend 20 minutes or more in a month to coordinate care. 4. Only one practitioner may furnish and bill the service in a calendar month. 5. The patient may stop CCM services at any time (effective at the end of the month) by phone call to the office staff. 6. The patient will be responsible for cost sharing (co-pay) of up to 20% of the service fee (after annual deductible is met).  Patient agreed to services and verbal consent obtained.   The patient verbalized understanding of instructions provided today and declined a print copy of patient instruction materials.    Plan: - Will communicate RN and LCSW needs to CCM colleagues - Will collaborate with PCP on medication management needs/suggestions highlighted above  Catie Darnelle Maffucci, PharmD Clinical Pharmacist [place] [phone]

## 2018-09-08 ENCOUNTER — Ambulatory Visit: Payer: BC Managed Care – PPO | Admitting: Nurse Practitioner

## 2018-09-15 ENCOUNTER — Telehealth: Payer: BC Managed Care – PPO

## 2018-09-21 ENCOUNTER — Ambulatory Visit: Payer: Self-pay | Admitting: Licensed Clinical Social Worker

## 2018-09-21 ENCOUNTER — Telehealth: Payer: BC Managed Care – PPO

## 2018-09-21 NOTE — Chronic Care Management (AMB) (Signed)
  Care Management Note   [name] is a 78 y.o. year old female who is a primary care patient of Cannady, Dorie Rank, NP. The CM team was consulted for assistance with chronic disease management and care coordination.   I reached out to [name] by phone today but was unable to reach patient successfully. HIPPA compliant voice message was left encouraging patient to return call once available. LCSW will make additional outreach attempt if no return call has been made.   Review of patient status, including review of consultants reports, relevant laboratory and other test results, and collaboration with appropriate care team members and the patient's provider was performed as part of comprehensive patient evaluation and provision of chronic care management services.   Follow Up Plan: A HIPPA compliant phone message was left for the patient providing contact information and requesting a return call.   Dickie La, BSW, MSW, LCSW [facility] Care [facility] [place]  [facility] [email or web address] Phone: [phone]

## 2018-09-23 ENCOUNTER — Telehealth: Payer: Self-pay

## 2018-09-23 ENCOUNTER — Telehealth: Payer: Self-pay | Admitting: Nurse Practitioner

## 2018-09-23 NOTE — Telephone Encounter (Signed)
Relayed message to CCM team, I believe this was meant for them on review of recent discussions with them.

## 2018-09-23 NOTE — Telephone Encounter (Signed)
Will discuss and address with her when we talk next week.

## 2018-09-23 NOTE — Telephone Encounter (Signed)
I wonder if this message was meant to be relayed to you all in regard to her lymphedema treatment.  FYI for your review.

## 2018-09-23 NOTE — Telephone Encounter (Signed)
Copied from CRM [id number]. Topic: General - Other >> Sep 23, 2018  1:01 PM Richarda Blade wrote: Reason for CRM: The patient wanted to let Dr. Harvest Dark know that the last time she had the furosemide (LASIX) 40 MG tablet was 05/16/2017. Please advise

## 2018-09-24 ENCOUNTER — Ambulatory Visit: Payer: Self-pay | Admitting: *Deleted

## 2018-09-24 ENCOUNTER — Telehealth: Payer: Self-pay

## 2018-09-24 DIAGNOSIS — I890 Lymphedema, not elsewhere classified: Secondary | ICD-10-CM

## 2018-09-24 NOTE — Chronic Care Management (AMB) (Signed)
  Chronic Care Management   Outreach Note  09/24/2018 Name: [name] MRN: [id number] DOB: 1940/09/28  Referred by: Marjie Skiff, NP Reason for referral : Chronic Care Management (Lymphedema)   An unsuccessful telephone outreach was attempted today. The patient was referred to the case management team by for assistance with chronic care management and care coordination.   Follow Up Plan: A HIPPA compliant phone message was left for the patient providing contact information and requesting a return call.  The CM team will reach out to the patient again over the next 14 days.   Ma Rings Lani Havlik RN, BSN Nurse Case Education officer, [facility]  [phone]) [employer]

## 2018-09-28 ENCOUNTER — Ambulatory Visit: Payer: Self-pay | Admitting: Licensed Clinical Social Worker

## 2018-09-28 ENCOUNTER — Telehealth: Payer: Self-pay

## 2018-09-28 NOTE — Chronic Care Management (AMB) (Signed)
  Care Management Note   [name] is a 78 y.o. year old female who is a primary care patient of Cannady, Dorie Rank, NP . The CM team was consulted for assistance with Grief Counseling.   Review of patient status, including review of consultants reports, rand collaboration with appropriate care team members and the patient's provider was performed as part of comprehensive patient evaluation and provision of chronic care management services. Telephone outreach to patient today to introduce CCM services.   I reached out to [name] by phone today but was unable to reach her successfully. HIPPA compliant voice message left encouraging patient to return call if interested in CCM social work support.   Follow Up Plan: SW will follow up with patient by phone over the next 30 days   Dickie La, BSW, MSW, LCSW [facility]   [facility] [email or web address] Phone: [phone]      [email or web address]

## 2018-09-29 ENCOUNTER — Other Ambulatory Visit: Payer: Self-pay | Admitting: Nurse Practitioner

## 2018-09-29 ENCOUNTER — Ambulatory Visit: Payer: BC Managed Care – PPO | Admitting: Pharmacist

## 2018-09-29 DIAGNOSIS — I10 Essential (primary) hypertension: Secondary | ICD-10-CM

## 2018-09-29 DIAGNOSIS — I890 Lymphedema, not elsewhere classified: Secondary | ICD-10-CM

## 2018-09-29 MED ORDER — FUROSEMIDE 40 MG PO TABS: 40.0000 mg | ORAL_TABLET | Freq: Every day | ORAL | 2 refills | Status: DC | PRN

## 2018-09-29 MED ORDER — BUPROPION HCL 75 MG PO TABS: 75.0000 mg | ORAL_TABLET | Freq: Two times a day (BID) | ORAL | 2 refills | Status: DC

## 2018-09-29 NOTE — Chronic Care Management (AMB) (Signed)
Chronic Care Management   Follow Up Note   09/29/2018 Name: [name] MRN: [id number] DOB: Jul 15, 1940  Referred by: Marjie Skiff, NP Reason for referral : Chronic Care Management (Medication Management)   [name] is a 78 y.o. year old female who is a primary care patient of Cannady, Dorie Rank, NP. The CCM team was consulted for assistance with chronic disease management and care coordination needs.    Contacted patient to f/u on medication changes recommended by PCP.  Review of patient status, including review of consultants reports, relevant laboratory and other test results, and collaboration with appropriate care team members and the patient's provider was performed as part of comprehensive patient evaluation and provision of chronic care management services.    Goals Addressed            This Visit's Progress     Patient Stated   . "I have a problem with my pulse dropping and I get dizzy" (pt-stated)       Current Barriers:  . Hx bradycardia, exacerbated by beta blocker use; does not appear she discussed this concern with PCP during visit on 08/31/2018 . Patient reports home heart rates consistently 55-60s, as low as 47 if she takes nebivolol 5mg   . Taking nebivolol 2.5 mg QHS; additionally on irbesartan 150 mg daily o Patient reports worried about low heart rate at night. States she will take her irbesartan at night and nebivolol in the morning  . Per chart review, no specific indication for beta blocker therapy o Last cardiology visit with Dr. Mariah Milling on 08/12/2016, he noted that she "likely doesn't need beta blocker therapy" o Discussed with PCP - will discontinue nebivolol.  Pharmacist Clinical Goal(s):  Marland Kitchen Over the next 14 days, patient will work with PharmD and PCP to address needs related to optimal hypertension management  Interventions:  Communicated with patient the recommendation to d/c nebivolol - voiced understanding. She will start checking blood  pressure 2-3 times per week to determine if further antihypertensive adjustment is needed  Patient Self Care Activities:  . Self administers medications as prescribed . Calls pharmacy for medication refills  Please see past updates related to this goal by clicking on the "Past Updates" button in the selected goal      . "I take a lot of medications and I'm not sure I'm using them correctly" (pt-stated)       Current Barriers:  Marland Kitchen Knowledge Deficits related to appropriate medication management  o Patient notes that she takes bupropion SR only once daily; if she takes her evening dose, it keeps her awake; she asks if there would be a way to split bupropion and take 1/2 tab in the afternoon. - Does note that she hasn't been crying as much recently, but was still hoping to connect with LCSW on counseling resources available now, and after she retires o Patient previously taking diphenhydramine every evening to help with leg itching/sleep. She now takes melatonin 6mg  and only takes diphenhydramine seldomly to help with sleep.  o Patient reports she is out of refills of furosemide o Collaborated with PCP Aura Dials - sent refills of furosemide, and updated bupropion prescription to the pharmacy   Pharmacist Clinical Goal(s):   Marland Kitchen Over the next 14 days, patient will work with PCP and CCM team to improve appropriate use of safe and effective medications  Interventions: . Comprehensive medication review performed. . Collaboration with provider re: medication management on the following:   o Communicated  the above changes with patient. She voiced understanding. She has scheduled 4 week follow up with PCP   Patient Self Care Activities:  . Does not self-administer prescription medications exactly as prescribed   Please see past updates related to this goal by clicking on the "Past Updates" button in the selected goal      . "My lyphedema is bad, my legs are itchy and swollen" (pt-stated)        Current Barriers:   Patient reports "leg muscle pain" that has gotten worse due to not being about to receive massage therapy. Described as "sore and painful - like working out for hours" and "feels like someone is pulling on my veins." It wakes her up at night and she has to adjust positions in order to sleep.  o Patient notes she has increased her use of furosemide but does not take it every day.  o Patient states she read where vitamin B-12 can cause increase muscle soreness and has stopped staking her supplement 2 days ago. She feels like "pain" was minimally better last night o Notes that massage therapy has not been able to reopen as it is classified as a salon.  Pharmacist Clinical Goal(s):  Marland Kitchen. Over the next 30 days, patient will work with PCP and CCM team to address needs related to management of lymphedema  Interventions: . Encouraged continued use of furosemide as prescribed  . Will ask CCM RN Janci Minor to reach out to patient to discuss strategies for self-management . Will ask Aura DialsJolene Cannady, NP, to review if further evaluation is warranted.   Patient Self Care Activities:  . Patient wears compression hose daily as directed  Please see past updates related to this goal by clicking on the "Past Updates" button in the selected goal         Plan:  -Will f/u with patient in 2-3 weeks for continued support  Catie Feliz Beamravis, PharmD Clinical Pharmacist [facility] [phone]

## 2018-09-29 NOTE — Progress Notes (Signed)
Review of Vanice Sarah PharmD CCM team medication review and in agreeance with medicine changes.  Will discontinue Nebivolol and if elevations in BP will increase Irbesartan.  Follow-up in 4 weeks.  Change Bupropion to Bupropion SR 75 mg BID.  Lasix refill sent in.

## 2018-09-29 NOTE — Chronic Care Management (AMB) (Signed)
Chronic Care Management   Follow Up Note   09/29/2018 Name: Barbara ApplebaumBetty M Walter MRN: [id number] DOB: 09/04/1940  Referred by: Marjie Skiffannady, [name] T, NP Reason for referral : Chronic Care Management (Medication Management)   [name] is a 78 y.o. year old female who is a primary care patient of Cannady, Dorie RankJolene T, NP. The CCM team was consulted for assistance with chronic disease management and care coordination needs.    Contacted patient telephonically for medication review and follow up on things addressed at visit last week.  Review of patient status, including review of consultants reports, relevant laboratory and other test results, and collaboration with appropriate care team members and the patient's provider was performed as part of comprehensive patient evaluation and provision of chronic care management services.    Goals Addressed            This Visit's Progress     Patient Stated   . "I have a problem with my pulse dropping and I get dizzy" (pt-stated)       Current Barriers:  . Hx bradycardia, exacerbated by beta blocker use; does not appear she discussed this concern with PCP during visit on 08/31/2018 . Patient reports home heart rates consistently 55-60s, as low as 47 if she takes nebivolol 5mg   . Taking nebivolol 2.5 mg QHS; additionally on irbesartan 150 mg daily o Patient reports worried about low heart rate at night. States she will take her irbesartan at night and nebivolol in the morning  . Per chart review, no specific indication for beta blocker therapy o Last cardiology visit with [name] on 08/12/2016, he noted that she "likely doesn't need beta blocker therapy"  Pharmacist Clinical Goal(s):  Marland Kitchen. Over the next 14 days, patient will work with PharmD and PCP to address needs related to optimal hypertension management  Interventions: . Comprehensive medication review and history performed . Will collaborate with Aura DialsJolene Cannady, NP; could consider discontinuation of  nebivolol and consider increasing irbesartan in the future if needed for BP control  Patient Self Care Activities:  . Self administers medications as prescribed . Calls pharmacy for medication refills  Please see past updates related to this goal by clicking on the "Past Updates" button in the selected goal      . "I take a lot of medications and I'm not sure I'm using them correctly" (pt-stated)       Current Barriers:  Marland Kitchen. Knowledge Deficits related to appropriate medication management  o Patient notes that she takes bupropion SR only once daily; if she takes her evening dose, it keeps her awake; she asks if there would be a way to split bupropion and take 1/2 tab in the afternoon. - Does note that she hasn't been crying as much recently, but was still hoping to connect with LCSW on counseling resources available now, and after she retires o Patient previously taking diphenhydramine every evening to help with leg itching/sleep. She now takes melatonin 6mg  and only takes diphenhydramine seldomly to help with sleep.  o Patient reports she is out of refills of furosemide  Pharmacist Clinical Goal(s):   Marland Kitchen. Over the next 14 days, patient will work with PCP and CCM team to improve appropriate use of safe and effective medications  Interventions: . Comprehensive medication review performed. . Collaboration with provider re: medication management on the following:   o Bupropion does come as immediate release 75 or 100 mg tablets. Could consider changing from 150 mg SR QAM to 75  or 100 mg BID (QAM and then in the afternoon); further evaluation of current depressive control is likely also warranted. Will collaborate with LCSW on continued support with this o Celebrated patient avoiding diphenhydramine  o Will reach out to Aura Dials, NP, for refills on furosemide if appropriate   Patient Self Care Activities:  . Does not self-administer prescription medications exactly as prescribed   Please  see past updates related to this goal by clicking on the "Past Updates" button in the selected goal      . "My lyphedema is bad, my legs are itchy and swollen" (pt-stated)       Current Barriers:   Patient reports "leg muscle pain" that has gotten worse due to not being about to receive massage therapy. Described as "sore and painful - like working out for hours" and "feels like someone is pulling on my veins." It wakes her up at night and she has to adjust positions in order to sleep.  o Patient notes she has increased her use of furosemide but does not take it every day.  o Patient states she read where vitamin B-12 can cause increase muscle soreness and has stopped staking her supplement 2 days ago. She feels like "pain" was minimally better last night o Notes that massage therapy has not been able to reopen as it is classified as a salon.  Pharmacist Clinical Goal(s):  Marland Kitchen Over the next 30 days, patient will work with PCP and CCM team to address needs related to management of lymphedema  Interventions: . Encouraged continued use of furosemide as prescribed  . Will ask CCM RN Janci Minor to reach out to patient to discuss strategies for self-management . Will ask Aura Dials, NP, to review if further evaluation is warranted.   Patient Self Care Activities:  . Patient wears compression hose daily as directed  Please see past updates related to this goal by clicking on the "Past Updates" button in the selected goal          Plan:  - Will collaborate with PCP Aura Dials on medication recommendations/care needs noted above - Will collaborate with CCM team to coordinate supportive care  Catie Feliz Beam, PharmD Clinical Pharmacist [facility] [phone]

## 2018-09-29 NOTE — Patient Instructions (Signed)
Visit Information  Goals Addressed            This Visit's Progress     Patient Stated   . "I have a problem with my pulse dropping and I get dizzy" (pt-stated)       Current Barriers:  . Hx bradycardia, exacerbated by beta blocker use; does not appear she discussed this concern with PCP during visit on 08/31/2018 . Patient reports home heart rates consistently 55-60s, as low as 47 if she takes nebivolol 5mg   . Taking nebivolol 2.5 mg QHS; additionally on irbesartan 150 mg daily o Patient reports worried about low heart rate at night. States she will take her irbesartan at night and nebivolol in the morning  . Per chart review, no specific indication for beta blocker therapy o Last cardiology visit with [name] on 08/12/2016, he noted that she "likely doesn't need beta blocker therapy"  Pharmacist Clinical Goal(s):  Marland Kitchen. Over the next 14 days, patient will work with PharmD and PCP to address needs related to optimal hypertension management  Interventions: . Comprehensive medication review and history performed . Will collaborate with Aura DialsJolene Cannady, NP; could consider discontinuation of nebivolol and consider increasing irbesartan in the future if needed for BP control  Patient Self Care Activities:  . Self administers medications as prescribed . Calls pharmacy for medication refills  Please see past updates related to this goal by clicking on the "Past Updates" button in the selected goal      . "I take a lot of medications and I'm not sure I'm using them correctly" (pt-stated)       Current Barriers:  Marland Kitchen. Knowledge Deficits related to appropriate medication management  o Patient notes that she takes bupropion SR only once daily; if she takes her evening dose, it keeps her awake; she asks if there would be a way to split bupropion and take 1/2 tab in the afternoon. - Does note that she hasn't been crying as much recently, but was still hoping to connect with LCSW on counseling  resources available now, and after she retires o Patient previously taking diphenhydramine every evening to help with leg itching/sleep. She now takes melatonin 6mg  and only takes diphenhydramine seldomly to help with sleep.  o Patient reports she is out of refills of furosemide  Pharmacist Clinical Goal(s):   Marland Kitchen. Over the next 14 days, patient will work with PCP and CCM team to improve appropriate use of safe and effective medications  Interventions: . Comprehensive medication review performed. . Collaboration with provider re: medication management on the following:   o Bupropion does come as immediate release 75 or 100 mg tablets. Could consider changing from 150 mg SR QAM to 75 or 100 mg BID (QAM and then in the afternoon); further evaluation of current depressive control is likely also warranted. Will collaborate with LCSW on continued support with this o Celebrated patient avoiding diphenhydramine  o Will reach out to Aura DialsJolene Cannady, NP, for refills on furosemide if appropriate   Patient Self Care Activities:  . Does not self-administer prescription medications exactly as prescribed   Please see past updates related to this goal by clicking on the "Past Updates" button in the selected goal      . "My lyphedema is bad, my legs are itchy and swollen" (pt-stated)       Current Barriers:   Patient reports "leg muscle pain" that has gotten worse due to not being about to receive massage therapy. Described as "sore  and painful - like working out for hours" and "feels like someone is pulling on my veins." It wakes her up at night and she has to adjust positions in order to sleep.  o Patient notes she has increased her use of furosemide but does not take it every day.  o Patient states she read where vitamin B-12 can cause increase muscle soreness and has stopped staking her supplement 2 days ago. She feels like "pain" was minimally better last night o Notes that massage therapy has not been  able to reopen as it is classified as a salon.  Pharmacist Clinical Goal(s):  Marland Kitchen Over the next 30 days, patient will work with PCP and CCM team to address needs related to management of lymphedema  Interventions: . Encouraged continued use of furosemide as prescribed  . Will ask CCM RN Janci Minor to reach out to patient to discuss strategies for self-management . Will ask Aura Dials, NP, to review if further evaluation is warranted.   Patient Self Care Activities:  . Patient wears compression hose daily as directed  Please see past updates related to this goal by clicking on the "Past Updates" button in the selected goal         The patient verbalized understanding of instructions provided today and declined a print copy of patient instruction materials.   Plan:  - Will collaborate with PCP Aura Dials on medication recommendations/care needs noted above - Will collaborate with CCM team to coordinate supportive care  Catie Feliz Beam, PharmD Clinical Pharmacist [facility] [phone]

## 2018-10-02 ENCOUNTER — Ambulatory Visit (INDEPENDENT_AMBULATORY_CARE_PROVIDER_SITE_OTHER): Payer: BC Managed Care – PPO | Admitting: *Deleted

## 2018-10-02 DIAGNOSIS — I890 Lymphedema, not elsewhere classified: Secondary | ICD-10-CM

## 2018-10-02 DIAGNOSIS — F3340 Major depressive disorder, recurrent, in remission, unspecified: Secondary | ICD-10-CM

## 2018-10-02 DIAGNOSIS — J4541 Moderate persistent asthma with (acute) exacerbation: Secondary | ICD-10-CM

## 2018-10-02 DIAGNOSIS — I10 Essential (primary) hypertension: Secondary | ICD-10-CM

## 2018-10-03 NOTE — Chronic Care Management (AMB) (Signed)
  Chronic Care Management   Follow Up Note   10/03/2018 Name: [name] MRN: [id number] DOB: Jul 11, 1940  Referred by: Marjie Skiff, NP Reason for referral : Chronic Care Management (Lymphedema )   [name] is a 78 y.o. year old female who is a primary care patient of Cannady, Dorie Rank, NP. The CCM team was consulted for assistance with chronic disease management and care coordination needs.    Review of patient status, including review of consultants reports, relevant laboratory and other test results, and collaboration with appropriate care team members and the patient's provider was performed as part of comprehensive patient evaluation and provision of chronic care management services.    Goals Addressed            This Visit's Progress   . I need to take better care of myself (pt-stated)       Current Barriers:  Marland Kitchen Knowledge Deficits related to maintaining optimal health with lymphedema and hypertension . Chronic Disease Management support and education needs related to weight management, lymphedema and hypertension  Nurse Case Manager Clinical Goal(s):  Marland Kitchen Over the next 90 days, patient will work with [facility] to address needs related to Weight Management, Lymphedema, and Hypertension . Over the next 90 days, the patient will demonstrate ongoing self health care management ability as evidenced by decreased weight, and increaseed adherence to self care activities*  Interventions:  . Evaluation of current treatment plan related to hypertension medication regimen and patient's adherence to plan as established by provider. . Provided education to patient re: Lymphedema management, weight management . Reviewed medications with patient and discussed new added meds and med times . Provided patient with verbal educational materials related to lymphedema . Advised patient, providing education and rationale, to check b/p qday and record, calling PCP for findings outside established  parameters.   . Discussed weight management strategies and encouraged patient to begin using [name] which she stated she had lots of success with in the past. . Encouraged patient to use her compression hose which she admitted to "getting slack on" . Discussed recent feelings of depression related to the loss of her spouse of 57 years and how this has affected her health.   Patient Self Care Activities:  . Currently UNABLE TO independently Manage weight, lymphedema, and hypertension effectively  Initial goal documentation         The CM team will reach out to the patient again over the next 14 days.  The patient has been provided with contact information for the chronic care management team and has been advised to call with any health related questions or concerns.    Ma Rings Whitlee Sluder RN, BSN Nurse Case Education officer, [facility]  [phone]) Business Mobile

## 2018-10-03 NOTE — Patient Instructions (Signed)
Thank you allowing the Chronic Care Management Team to be a part of your care! It was a pleasure speaking with you today!   CCM (Chronic Care Management) Team   Markian Glockner RN, BSN Nurse Care Coordinator  [phone]  Catie [facility] PharmD  Clinical Pharmacist  [phone]  Dickie La LCSW Clinical Social Worker [phone]  Goals Addressed            This Visit's Progress   . I need to take better care of myself (pt-stated)       Current Barriers:  Marland Kitchen Knowledge Deficits related to maintaining optimal health with lymphedema and hypertension . Chronic Disease Management support and education needs related to weight management, lymphedema and hypertension  Nurse Case Manager Clinical Goal(s):  Marland Kitchen Over the next 90 days, patient will work with [facility] to address needs related to Weight Management, Lymphedema, and Hypertension . Over the next 90 days, the patient will demonstrate ongoing self health care management ability as evidenced by decreased weight, and increaseed adherence to self care activities*  Interventions:  . Evaluation of current treatment plan related to hypertension medication regimen and patient's adherence to plan as established by provider. . Provided education to patient re: Lymphedema management, weight management . Reviewed medications with patient and discussed new added meds and med times . Provided patient with verbal educational materials related to lymphedema . Advised patient, providing education and rationale, to check b/p qday and record, calling PCP for findings outside established parameters.   . Discussed weight management strategies and encouraged patient to begin using [name] which she stated she had lots of success with in the past. . Encouraged patient to use her compression hose which she admitted to "getting slack on" . Discussed recent feelings of depression related to the loss of her spouse of 57 years and how this has affected  her health.   Patient Self Care Activities:  . Currently UNABLE TO independently Manage weight, lymphedema, and hypertension effectively  Initial goal documentation        The patient verbalized understanding of instructions provided today and declined a print copy of patient instruction materials.   The CM team will reach out to the patient again over the next 14 days.     Lymphedema  Lymphedema is swelling that is caused by the abnormal collection of lymph in the tissues under the skin. Lymph is fluid from the tissues in your body that is removed through the lymphatic system. This system is part of your body's defense system (immune system) and includes lymph nodes and lymph vessels. The lymph vessels collect and carry the excess fluid, fats, proteins, and wastes from the tissues of the body to the bloodstream. This system also works to clean and remove bacteria and waste products from the body. Lymphedema occurs when the lymphatic system is blocked. When the lymph vessels or lymph nodes are blocked or damaged, lymph does not drain properly. This causes an abnormal buildup of lymph, which leads to swelling in the affected area. This may include the trunk area, or an arm or leg. Lymphedema cannot be cured by medicines, but various methods can be used to help reduce the swelling. There are two types of lymphedema: primary lymphedema and secondary lymphedema. What are the causes? The cause of this condition depends on the type of lymphedema that you have.  Primary lymphedema is caused by the absence of lymph vessels or having abnormal lymph vessels at birth.  Secondary lymphedema occurs when lymph  vessels are blocked or damaged. Secondary lymphedema is more common. Common causes of lymph vessel blockage include: ? Skin infection, such as cellulitis. ? Infection by parasites (filariasis). ? Injury. ? Radiation therapy. ? Cancer. ? Formation of scar tissue. ? Surgery. What are the  signs or symptoms? Symptoms of this condition include:  Swelling of the arm or leg.  A heavy or tight feeling in the arm or leg.  Swelling of the feet, toes, or fingers. Shoes or rings may fit more tightly than before.  Redness of the skin over the affected area.  Limited movement of the affected limb.  Sensitivity to touch or discomfort in the affected limb. How is this diagnosed? This condition may be diagnosed based on:  Your symptoms and medical history.  A physical exam.  Bioimpedance spectroscopy. In this test, painless electrical currents are used to measure fluid levels in your body.  Imaging tests, such as: ? Lymphoscintigraphy. In this test, a low dose of a radioactive substance is injected to trace the flow of lymph through the lymph vessels. ? MRI. ? CT scan. ? Duplex ultrasound. This test uses sound waves to produce images of the vessels and the blood flow on a screen. ? Lymphangiography. In this test, a contrast dye is injected into the lymph vessel to help show blockages. How is this treated? Treatment for this condition may depend on the cause of your lymphedema. Treatment may include:  Complete decongestive therapy (CDT). This is done by a certified lymphedema therapist to reduce fluid congestion. This therapy includes: ? Manual lymph drainage. This is a special massage technique that promotes lymph drainage out of a limb. ? Skin care. ? Compression wrapping of the affected area. ? Specific exercises. Certain exercises can help fluid move out of the affected limb.  Compression. Various methods may be used to apply pressure to the affected limb to reduce the swelling. They include: ? Wearing compression stockings or sleeves on the affected limb. ? Wrapping the affected limb with special bandages.  Surgery. This is usually done for severe cases only. For example, surgery may be done if you have trouble moving the limb or if the swelling does not get better  with other treatments. If an underlying condition is causing the lymphedema, treatment for that condition will be done. For example, antibiotic medicines may be used to treat an infection. Follow these instructions at home: Self-care  The affected area is more likely to become injured or infected. Take these steps to help prevent infection: ? Keep the affected area clean and dry. ? Use approved creams or lotions to keep the skin moisturized. ? Protect your skin from cuts:  Use gloves while cooking or gardening.  Do not walk barefoot.  If you shave the affected area, use an Neurosurgeon.  Do not wear tight clothes, shoes, or jewelry.  Eat a healthy diet that includes a lot of fruits and vegetables. Activity  Exercise regularly as directed by your health care provider.  Do not sit with your legs crossed.  When possible, keep the affected limb raised (elevated) above the level of your heart.  Avoid carrying things with an arm that is affected by lymphedema. General instructions  Wear compression stockings or sleeves as told by your health care provider.  Note any changes in size of the affected limb. You may be instructed to take regular measurements and keep track of them.  Take over-the-counter and prescription medicines only as told by your health care provider.  If you were prescribed an antibiotic medicine, take or apply it as told by your health care provider. Do not stop using the antibiotic even if you start to feel better.  Do not use heating pads or ice packs over the affected area.  Avoid having blood draws, IV insertions, or blood pressure checked on the affected limb.  Keep all follow-up visits as told by your health care provider. This is important. Contact a health care provider if you:  Continue to have swelling in your limb.  Have a cut that does not heal.  Have redness or pain in the affected area. Get help right away if you:  Have new swelling in  your limb that comes on suddenly.  Develop purplish spots, rash or sores (lesions) on your affected limb.  Have shortness of breath.  Have a fever or chills. Summary  Lymphedema is swelling that is caused by the abnormal collection of lymph in the tissues under the skin.  Lymph is fluid from the tissues in your body that is removed through the lymphatic system. This system collects and carries excess fluid, fats, proteins, and wastes from the tissues of the body to the bloodstream.  Lymphedema causes swelling, pain, and redness in the affected area. This may include the trunk area, or an arm or leg.  Treatment for this condition may depend on the cause of your lymphedema. Treatment may include complete decongestive therapy (CDT), compression methods, surgery, or treating the underlying cause. This information is not intended to replace advice given to you by your health care provider. Make sure you discuss any questions you have with your health care provider. Document Released: 03/03/2007 Document Revised: 05/19/2017 Document Reviewed: 05/19/2017 Elsevier Interactive Patient Education  2019 [employer]

## 2018-10-06 ENCOUNTER — Other Ambulatory Visit: Payer: Self-pay | Admitting: Internal Medicine

## 2018-10-06 ENCOUNTER — Ambulatory Visit: Payer: BC Managed Care – PPO | Admitting: Pharmacist

## 2018-10-06 ENCOUNTER — Telehealth: Payer: Self-pay

## 2018-10-06 DIAGNOSIS — I10 Essential (primary) hypertension: Secondary | ICD-10-CM | POA: Diagnosis not present

## 2018-10-06 DIAGNOSIS — F3340 Major depressive disorder, recurrent, in remission, unspecified: Secondary | ICD-10-CM | POA: Diagnosis not present

## 2018-10-06 DIAGNOSIS — J4541 Moderate persistent asthma with (acute) exacerbation: Secondary | ICD-10-CM | POA: Diagnosis not present

## 2018-10-06 NOTE — Patient Instructions (Signed)
Visit Information  Goals Addressed            This Visit's Progress     Patient Stated   . "I have a problem with my pulse dropping and I get dizzy" (pt-stated)       Current Barriers:  . Hx bradycardia, exacerbated by beta blocker use; does not appear she discussed this concern with PCP during visit on 08/31/2018 . Patient reports home heart rates consistently 55-60s, as low as 47 if she takes nebivolol 5mg   . Patient still taking nebivolol 2.5 mg QHS and irbesartan 150 mg in the morning o Previously discussed with PCP to discontinue the nebivolol can communicated the change to the patient. o Patient reports systolic blood pressures in the 180s when she tried to stop the nebivolol and was scared of how high it was; so patient restarted nebivolol to decrease her blood pressure again. Blood pressure 154-156/70-81, pulse 60s once she readded the nebivolol  o Patient states irbesartan does not effect her systolic pressure, but will lower her diastolic pressure  . Per chart review, no specific indication for beta blocker therapy o Last cardiology visit with [name] on 08/12/2016, he noted that she "likely doesn't need beta blocker therapy"  Pharmacist Clinical Goal(s):  Marland Kitchen. Over the next 14 days, patient will work with PharmD and PCP to address needs related to optimal hypertension management  Interventions:  Additional blood pressure lowering therapy warranted, however beta blocker not indicated and frequently lowers pulse below 60. Consider discontinuing nebivolol and adding spironolactone as this may benefit patient's lymphedema and decrease her blood pressure. Monitor potassium closely. Would not recommend as calcium channel blocker as this may worsen lymphedema. Could also increase irbesartan to 300mg  daily, however patient does think this will lower her blood pressure enough.    Encouraged patient to discuss any concerns with medication changes or hypertension treatment with PCP Barbara DialsJolene  [name] at their upcoming visit   Patient Self Care Activities:  . Self administers medications as prescribed . Calls pharmacy for medication refills  . Monitor blood pressure 2-3 times weekly and will monitor pulse  Please see past updates related to this goal by clicking on the "Past Updates" button in the selected goal      . "I take a lot of medications and I'm not sure I'm using them correctly" (pt-stated)       Current Barriers:  Marland Kitchen. Knowledge Deficits related to appropriate medication management  o Patient previously noted that she takes bupropion SR only once daily; if she takes her evening dose, it keeps her awake; she asked if there would be a way to split bupropion and take 1/2 tab in the afternoon. - Collaborated with PCP, [name], and sent prescription for buproprion 75mg  BID to replace bupropion SR 150mg .  - Patient states she tried the bupropion 75mg  BID for 2 days and felt like it was not working so went back to the buproprion SR 150mg .  - Patient states she doesn't care if it keeps her up at night because she just wants something that works. Notes that she thought the immediate release bupropion increased her BP o Patient previously taking diphenhydramine every evening to help with leg itching/sleep. She now takes melatonin 6mg  and only takes diphenhydramine seldomly to help with sleep.   Pharmacist Clinical Goal(s):   Marland Kitchen. Over the next 14 days, patient will work with PCP and CCM team to improve appropriate use of safe and effective medications  Interventions: . Explained  PK of bupropion immediate release, and that it often takes a longer period of time to see full benefit of antidepressant medications. Encouraged her to discuss concerns and potential future changes with PCP at upcoming appointment  Patient Self Care Activities:  . Does not self-administer prescription medications exactly as prescribed   Please see past updates related to this goal by clicking on the  "Past Updates" button in the selected goal      . "My lyphedema is bad, my legs are itchy and swollen" (pt-stated)       Current Barriers:   Patient reports "leg muscle pain" that has gotten worse due to not being about to receive massage therapy. Described as "sore and painful - like working out for hours" and "feels like someone is pulling on my veins." It wakes her up at night and she has to adjust positions in order to sleep.   Patient states that her pain has gotten better since she stopped the B-12 and is ultilizing her furosemide more often   Pharmacist Clinical Goal(s):  Marland Kitchen Over the next 30 days, patient will work with PCP and CCM team to address needs related to management of lymphedema  Interventions: . Encouraged continued use of furosemide as prescribed and to report her use to PCP at next visit to determine if lab work is warrented  Patient Self Care Activities:  . Patient wears compression hose daily as directed . Patient will continue to work with CCM RN [name] for strategies to reduce pain and improve lymphedema   Please see past updates related to this goal by clicking on the "Past Updates" button in the selected goal         The patient verbalized understanding of instructions provided today and declined a print copy of patient instruction materials.    Plan:  - PharmD and CCM team will continue to support patient with her chronic disease state management - PharmD will follow up with patient after PCP appointment next month   [name], PharmD Clinical Pharmacist [facility] [phone]

## 2018-10-06 NOTE — Chronic Care Management (AMB) (Signed)
Chronic Care Management   Follow Up Note   10/06/2018 Name: [name] MRN: [id number] DOB: 02/21/1941  Referred by: Marjie Skiffannady, [name] T, NP Reason for referral : Chronic Care Management (Medication Management)   [name] is a 78 y.o. year old female who is a primary care patient of Cannady, Dorie RankJolene T, NP. The CCM team was consulted for assistance with chronic disease management and care coordination needs.    Contacted patient telephonically for medication review and follow up on things addressed at visit last week, as I had received message from Janci Minor, RN that the patient had concerns regarding recent medication changes.   Review of patient status, including review of consultants reports, relevant laboratory and other test results, and collaboration with appropriate care team members and the patient's provider was performed as part of comprehensive patient evaluation and provision of chronic care management services.    Goals Addressed            This Visit's Progress     Patient Stated   . "I have a problem with my pulse dropping and I get dizzy" (pt-stated)       Current Barriers:  . Hx bradycardia, exacerbated by beta blocker use; does not appear she discussed this concern with PCP during visit on 08/31/2018 . Patient reports home heart rates consistently 55-60s, as low as 47 if she takes nebivolol 5mg   . Patient still taking nebivolol 2.5 mg QHS and irbesartan 150 mg in the morning o Previously discussed with PCP to discontinue the nebivolol can communicated the change to the patient. o Patient reports systolic blood pressures in the 180s when she tried to stop the nebivolol and was scared of how high it was; so patient restarted nebivolol to decrease her blood pressure again. Blood pressure 154-156/70-81, pulse 60s once she readded the nebivolol  o Patient states irbesartan does not effect her systolic pressure, but will lower her diastolic pressure  . Per chart review,  no specific indication for beta blocker therapy o Last cardiology visit with [name] on 08/12/2016, he noted that she "likely doesn't need beta blocker therapy"  Pharmacist Clinical Goal(s):  Marland Kitchen. Over the next 14 days, patient will work with PharmD and PCP to address needs related to optimal hypertension management  Interventions:  Additional blood pressure lowering therapy warranted, however beta blocker not indicated and frequently lowers pulse below 60. Consider discontinuing nebivolol and adding spironolactone as this may benefit patient's lymphedema and decrease her blood pressure. Monitor potassium closely. Would not recommend as calcium channel blocker as this may worsen lymphedema. Could also increase irbesartan to 300mg  daily, however patient does think this will lower her blood pressure enough.    Encouraged patient to discuss any concerns with medication changes or hypertension treatment with PCP Aura DialsJolene Cannady at their upcoming visit   Patient Self Care Activities:  . Self administers medications as prescribed . Calls pharmacy for medication refills  . Monitor blood pressure 2-3 times weekly and will monitor pulse  Please see past updates related to this goal by clicking on the "Past Updates" button in the selected goal      . "I take a lot of medications and I'm not sure I'm using them correctly" (pt-stated)       Current Barriers:  Marland Kitchen. Knowledge Deficits related to appropriate medication management  o Patient previously noted that she takes bupropion SR only once daily; if she takes her evening dose, it keeps her awake; she asked if  there would be a way to split bupropion and take 1/2 tab in the afternoon. - Collaborated with PCP, Aura Dials, and sent prescription for buproprion 75mg  BID to replace bupropion SR 150mg .  - Patient states she tried the bupropion 75mg  BID for 2 days and felt like it was not working so went back to the buproprion SR 150mg .  - Patient states she  doesn't care if it keeps her up at night because she just wants something that works. Notes that she thought the immediate release bupropion increased her BP o Patient previously taking diphenhydramine every evening to help with leg itching/sleep. She now takes melatonin 6mg  and only takes diphenhydramine seldomly to help with sleep.   Pharmacist Clinical Goal(s):   Marland Kitchen Over the next 14 days, patient will work with PCP and CCM team to improve appropriate use of safe and effective medications  Interventions: . Explained PK of bupropion immediate release, and that it often takes a longer period of time to see full benefit of antidepressant medications. Encouraged her to discuss concerns and potential future changes with PCP at upcoming appointment  Patient Self Care Activities:  . Does not self-administer prescription medications exactly as prescribed   Please see past updates related to this goal by clicking on the "Past Updates" button in the selected goal      . "My lyphedema is bad, my legs are itchy and swollen" (pt-stated)       Current Barriers:   Patient reports "leg muscle pain" that has gotten worse due to not being about to receive massage therapy. Described as "sore and painful - like working out for hours" and "feels like someone is pulling on my veins." It wakes her up at night and she has to adjust positions in order to sleep.   Patient states that her pain has gotten better since she stopped the B-12 and is ultilizing her furosemide more often   Pharmacist Clinical Goal(s):  Marland Kitchen Over the next 30 days, patient will work with PCP and CCM team to address needs related to management of lymphedema  Interventions: . Encouraged continued use of furosemide as prescribed and to report her use to PCP at next visit to determine if lab work is warrented  Patient Self Care Activities:  . Patient wears compression hose daily as directed . Patient will continue to work with CCM RN Ma Rings  Minor for strategies to reduce pain and improve lymphedema   Please see past updates related to this goal by clicking on the "Past Updates" button in the selected goal          Plan:  - PharmD and CCM team will continue to support patient with her chronic disease state management - PharmD will follow up with patient after PCP appointment next month   Catie Feliz Beam, PharmD Clinical Pharmacist [facility] [phone]

## 2018-10-06 NOTE — Telephone Encounter (Signed)
Called patient.  No answer. LMOV.  Would like to offer in office appointment with Eula Listen, PA

## 2018-10-15 ENCOUNTER — Ambulatory Visit (INDEPENDENT_AMBULATORY_CARE_PROVIDER_SITE_OTHER): Payer: BC Managed Care – PPO | Admitting: Internal Medicine

## 2018-10-15 ENCOUNTER — Encounter: Payer: Self-pay | Admitting: Internal Medicine

## 2018-10-15 ENCOUNTER — Other Ambulatory Visit: Payer: Self-pay

## 2018-10-15 DIAGNOSIS — J4520 Mild intermittent asthma, uncomplicated: Secondary | ICD-10-CM

## 2018-10-15 DIAGNOSIS — G4733 Obstructive sleep apnea (adult) (pediatric): Secondary | ICD-10-CM

## 2018-10-15 DIAGNOSIS — J411 Mucopurulent chronic bronchitis: Secondary | ICD-10-CM

## 2018-10-15 MED ORDER — TIOTROPIUM BROMIDE MONOHYDRATE 1.25 MCG/ACT IN AERS: 2.0000 | INHALATION_SPRAY | Freq: Every day | RESPIRATORY_TRACT | 5 refills | Status: DC

## 2018-10-15 NOTE — Patient Instructions (Signed)
Continue CPAP as prescribed °Continue inhalers as prescribed ° ° ° °

## 2018-10-15 NOTE — Progress Notes (Signed)
Name: Barbara ApplebaumBetty M [name] MRN: [id number] DOB: 01/06/1941     CONSULTATION DATE: 3.26.19 REFERRING MD : Jenne CampusmcQueen  CHIEF COMPLAINT: cough and abnormal CXR  STUDIES:  CXR independently reviewed 3.6.19   +left lung opacity noted Since Nov 2018  CT chest 4.24.19 I have Independently reviewed images of  CT chest   Interpretation:Left mid lung opacity mild, unchanged on CXR for last 2 years  SLeep Study 4.2019 AHi 16, AHi 42 with REM sleep  PFT's 4.23.19 No obvious signs of obstructive lung disease No restrictive lung disease   ALL test results discussed with patient and family       TELEPHONE VISIT    In the setting of the current Covid19 crisis, you are scheduled for a  visit with me on 10/15/2018  Just as we do with many in-office visits, in order for you to participate in this visit, we must obtain consent.   I can obtain your verbal consent now.  PATIENT AGREES AND CONFIRMS -YES This Visit has Audio and Visual Capabilities for optimal patient care experience   Evaluation Performed:  Follow-up visit  This visit type was conducted due to national recommendations for restrictions regarding the COVID-19 Pandemic (e.g. social distancing).  This format is felt to be most appropriate for this patient at this time.  All issues noted in this document were discussed and addressed.     Virtual Visit via Telephone Note    I connected with patient on 10/15/2018  by telephone and verified that I am speaking with the correct person using two identifiers.   I discussed the limitations, risks, security and privacy concerns of performing an evaluation and management service by telephone and the availability of in person appointments. I also discussed with the patient that there may be a patient responsible charge related to this service. The patient expressed understanding and agreed to proceed.   Location of the patient: Home Location of provider: office Participating persons: Patient  and provider only     CC Follow up SOB and OSA  HISTORY OF PRESENT ILLNESS: SOB AND DOE MUCH IMPROVED Continue to take inhalers Doing well overall   No signs of infection No fevers No chills  On Wixila inhaler Patient wants to continue Spiriva Albuterol as needed-not using it frequently   Non smoker Extensive secondhand smoke exposure for approximately 60 years  Patient to follow-up with [name] with cardiology  Sleep study confirms severe sleep apnea Continue CPAP as prescribed I will need compliance report at next office visit     Review of Systems:  Gen:  Denies  fever, sweats, chills weigh loss  HEENT: Denies blurred vision, double vision, ear pain, eye pain, hearing loss, nose bleeds, sore throat Cardiac:  No dizziness, chest pain or heaviness, chest tightness,edema, No JVD Resp:   No cough, -sputum production, -shortness of breath,-wheezing, -hemoptysis,  Gi: Denies swallowing difficulty, stomach pain, nausea or vomiting, diarrhea, constipation, bowel incontinence Gu:  Denies bladder incontinence, burning urine Ext:   Denies Joint pain, stiffness or swelling Skin: Denies  skin rash, easy bruising or bleeding or hives Endoc:  Denies polyuria, polydipsia , polyphagia or weight change Psych:   Denies depression, insomnia or hallucinations  Other:  All other systems negative       ASSESSMENT / PLAN:  78 year old pleasant white female with morbid obesity with chronic lymphedema for follow-up visit today for shortness of breath and cough with a history of upper respiratory tract infections in the setting of secondhand  smoke exposure findings suggest underlying reactive airways disease with chronic bronchitis in the setting of severe morbid obesity with underlying diagnosis of sleep apnea  Patient does have a persistent left midlung opacification which is related to scar tissue no significant changes in the last several years   At this time I would  recommend several options and therapies for her  #1  Chronic bronchitis with intermittent reactive airways disease Continue Advair as prescribed Tolerating well and rinsing mouth Continue Spiriva Respimat as prescribed Patient states she needs this to help her breathe She is tolerating it well continue Advair as prescribed-tolerating well and rinsing mouth albuterol nebulizers and MDIs as needed   #2 severe OSA with AHI of 16 and AHI of 42 and REM sleep Will need compliance report at next office visit Previous compliance report shows 100% compliance Patient using and benefiting from CPAP therapy   #3 obesity -recommend significant weight loss -recommend changing diet  #4 deconditioned state -Recommend increased daily activity and exercise    COVID-19 EDUCATION: The signs and symptoms of COVID-19 were discussed with the patient and how to seek care for testing.  The importance of social distancing was discussed today. Hand Washing Techniques and avoid touching face was advised.  MEDICATION ADJUSTMENTS/LABS AND TESTS ORDERED: Continue inhalers as prescribed Spiriva Respimat refilled Recommend starting over-the-counter antihistamine for allergic rhinitis   CURRENT MEDICATIONS REVIEWED AT LENGTH WITH PATIENT TODAY   Patient/Family are satisfied with Plan of action and management. All questions answered Follow up in 6 months  Total time spent is 23 minutes  Lucie Leather, M.D.  [facility]  Medical Director [facility] Medical Director [facility]

## 2018-10-16 ENCOUNTER — Telehealth: Payer: BC Managed Care – PPO

## 2018-10-19 ENCOUNTER — Ambulatory Visit: Payer: Self-pay | Admitting: Licensed Clinical Social Worker

## 2018-10-19 ENCOUNTER — Telehealth: Payer: Self-pay

## 2018-10-19 NOTE — Chronic Care Management (AMB) (Signed)
  Care Management Note   [name] is a 78 y.o. year old female who is a primary care patient of Cannady, Dorie Rank, NP. The CM team was consulted for assistance with chronic disease management and care coordination.   I reached out to [name] by phone today. LCSW completed CCM outreach attempt today but was unable to reach patient successfully. A HIPPA compliant voice message was left encouraging patient to return call once available. LCSW rescheduled CCM SW appointment as well.  Review of patient status, including review of consultants reports, relevant laboratory and other test results, and collaboration with appropriate care team members and the patient's provider was performed as part of comprehensive patient evaluation and provision of chronic care management services.   Follow Up Plan: The care management team will reach out to the patient again over the next 2-4 weeks days.    Dickie La, BSW, MSW, LCSW [facility] Care Management   [facility] [email or web address] Phone: [phone]

## 2018-10-23 ENCOUNTER — Telehealth: Payer: Self-pay

## 2018-11-02 ENCOUNTER — Ambulatory Visit (INDEPENDENT_AMBULATORY_CARE_PROVIDER_SITE_OTHER): Payer: BC Managed Care – PPO | Admitting: Licensed Clinical Social Worker

## 2018-11-02 ENCOUNTER — Other Ambulatory Visit: Payer: Self-pay

## 2018-11-02 DIAGNOSIS — J4541 Moderate persistent asthma with (acute) exacerbation: Secondary | ICD-10-CM | POA: Diagnosis not present

## 2018-11-02 DIAGNOSIS — F3340 Major depressive disorder, recurrent, in remission, unspecified: Secondary | ICD-10-CM

## 2018-11-02 DIAGNOSIS — I10 Essential (primary) hypertension: Secondary | ICD-10-CM | POA: Diagnosis not present

## 2018-11-02 NOTE — Chronic Care Management (AMB) (Signed)
  Chronic Care Management    Clinical Social Work Follow Up Note  11/02/2018 Name: [name] MRN: [id number] DOB: 03/23/1941  [name] is a 78 y.o. year old female who is a primary care patient of Cannady, Barbaraann Faster, NP. The CCM team was consulted for assistance with Grief Counseling.   Review of patient status, including review of consultants reports, other relevant assessments, and collaboration with appropriate care team members and the patient's provider was performed as part of comprehensive patient evaluation and provision of chronic care management services.     Goals Addressed    . Patient Stated (pt-stated)       Current Barriers:  . Financial constraints . ADL IADL limitations . Social Isolation . Lacks knowledge of community resource: grief support resources within the area  Clinical Social Work Clinical Goal(s):  Marland Kitchen Over the next 90 days, client will work with SW to address concerns related to lack of self-care and ongoing grief over spouse  Interventions: . Patient interviewed and appropriate assessments performed . Provided mental health counseling regarding loss of spouse. Patient denies wanting grief counseling referral at this time. States that she has a large support network and has recently retired. Patient reports spending a lot of time crafting and painting to help cope.  . Provided patient with information about [facility]  . Discussed plans with patient for ongoing care management follow up and provided patient with direct contact information for care management team . Advised patient to to contact CCM LCSW if any urgent concerns arise.  . Assisted patient/caregiver with obtaining information about health plan benefits .  LCSW provided reflective listening and implemented appropriate interventions to help suppport patient and her emotional needs   Patient Self Care Activities:  . Attends all scheduled provider appointments . Calls provider office for  new concerns or questions  Initial goal documentation         Follow Up Plan: SW will follow up with patient by phone next month.  Eula Fried, BSW, MSW, [facility] Care Management [email or web address] Phone: [phone]

## 2018-11-04 ENCOUNTER — Ambulatory Visit: Payer: BC Managed Care – PPO | Admitting: Nurse Practitioner

## 2018-11-06 ENCOUNTER — Telehealth: Payer: Self-pay

## 2018-11-11 ENCOUNTER — Ambulatory Visit (INDEPENDENT_AMBULATORY_CARE_PROVIDER_SITE_OTHER): Payer: BC Managed Care – PPO | Admitting: Nurse Practitioner

## 2018-11-11 ENCOUNTER — Encounter: Payer: Self-pay | Admitting: Nurse Practitioner

## 2018-11-11 ENCOUNTER — Other Ambulatory Visit: Payer: Self-pay

## 2018-11-11 VITALS — BP 156/82

## 2018-11-11 DIAGNOSIS — F5101 Primary insomnia: Secondary | ICD-10-CM

## 2018-11-11 DIAGNOSIS — I10 Essential (primary) hypertension: Secondary | ICD-10-CM

## 2018-11-11 DIAGNOSIS — G4700 Insomnia, unspecified: Secondary | ICD-10-CM | POA: Insufficient documentation

## 2018-11-11 MED ORDER — TRIAMCINOLONE ACETONIDE 0.1 % EX CREA: 1.0000 "application " | TOPICAL_CREAM | Freq: Two times a day (BID) | CUTANEOUS | 0 refills | Status: DC

## 2018-11-11 MED ORDER — TRAZODONE HCL 50 MG PO TABS: 25.0000 mg | ORAL_TABLET | Freq: Every evening | ORAL | 2 refills | Status: DC | PRN

## 2018-11-11 NOTE — Assessment & Plan Note (Signed)
Chronic, ongoing.  BP above goal, but patient just restarted previous med regimen.  Discussed goal for BP with patient.  Would avoid Amlodipine due to lymphedema.  Continue current medication regimen.  Could go up on Bystolic if needed at future visits, although monitor HR.

## 2018-11-11 NOTE — Assessment & Plan Note (Signed)
Ongoing with poor response to Melatonin.  Recommend not taking Benadryl and she is cutting back, BEERS criteria.  Will trial as needed Trazodone 25 MG.  Return in 3 months for follow-up or sooner if needed.

## 2018-11-11 NOTE — Patient Instructions (Signed)
DASH Eating Plan  DASH stands for "Dietary Approaches to Stop Hypertension." The DASH eating plan is a healthy eating plan that has been shown to reduce high blood pressure (hypertension). It may also reduce your risk for type 2 diabetes, heart disease, and stroke. The DASH eating plan may also help with weight loss.  What are tips for following this plan?    General guidelines   Avoid eating more than 2,300 mg (milligrams) of salt (sodium) a day. If you have hypertension, you may need to reduce your sodium intake to 1,500 mg a day.   Limit alcohol intake to no more than 1 drink a day for nonpregnant women and 2 drinks a day for men. One drink equals 12 oz of beer, 5 oz of wine, or 1 oz of hard liquor.   Work with your health care provider to maintain a healthy body weight or to lose weight. Ask what an ideal weight is for you.   Get at least 30 minutes of exercise that causes your heart to beat faster (aerobic exercise) most days of the week. Activities may include walking, swimming, or biking.   Work with your health care provider or diet and nutrition specialist (dietitian) to adjust your eating plan to your individual calorie needs.  Reading food labels     Check food labels for the amount of sodium per serving. Choose foods with less than 5 percent of the Daily Value of sodium. Generally, foods with less than 300 mg of sodium per serving fit into this eating plan.   To find whole grains, look for the word "whole" as the first word in the ingredient list.  Shopping   Buy products labeled as "low-sodium" or "no salt added."   Buy fresh foods. Avoid canned foods and premade or frozen meals.  Cooking   Avoid adding salt when cooking. Use salt-free seasonings or herbs instead of table salt or sea salt. Check with your health care provider or pharmacist before using salt substitutes.   Do not fry foods. Cook foods using healthy methods such as baking, boiling, grilling, and broiling instead.   Cook with  heart-healthy oils, such as olive, canola, soybean, or sunflower oil.  Meal planning   Eat a balanced diet that includes:  ? 5 or more servings of fruits and vegetables each day. At each meal, try to fill half of your plate with fruits and vegetables.  ? Up to 6-8 servings of whole grains each day.  ? Less than 6 oz of lean meat, poultry, or fish each day. A 3-oz serving of meat is about the same size as a deck of cards. One egg equals 1 oz.  ? 2 servings of low-fat dairy each day.  ? A serving of nuts, seeds, or beans 5 times each week.  ? Heart-healthy fats. Healthy fats called Omega-3 fatty acids are found in foods such as flaxseeds and coldwater fish, like sardines, salmon, and mackerel.   Limit how much you eat of the following:  ? Canned or prepackaged foods.  ? Food that is high in trans fat, such as fried foods.  ? Food that is high in saturated fat, such as fatty meat.  ? Sweets, desserts, sugary drinks, and other foods with added sugar.  ? Full-fat dairy products.   Do not salt foods before eating.   Try to eat at least 2 vegetarian meals each week.   Eat more home-cooked food and less restaurant, buffet, and fast food.     When eating at a restaurant, ask that your food be prepared with less salt or no salt, if possible.  What foods are recommended?  The items listed may not be a complete list. Talk with your dietitian about what dietary choices are best for you.  Grains  Whole-grain or whole-wheat bread. Whole-grain or whole-wheat pasta. Brown rice. Oatmeal. Quinoa. Bulgur. Whole-grain and low-sodium cereals. Pita bread. Low-fat, low-sodium crackers. Whole-wheat flour tortillas.  Vegetables  Fresh or frozen vegetables (raw, steamed, roasted, or grilled). Low-sodium or reduced-sodium tomato and vegetable juice. Low-sodium or reduced-sodium tomato sauce and tomato paste. Low-sodium or reduced-sodium canned vegetables.  Fruits  All fresh, dried, or frozen fruit. Canned fruit in natural juice (without  added sugar).  Meat and other protein foods  Skinless chicken or turkey. Ground chicken or turkey. Pork with fat trimmed off. Fish and seafood. Egg whites. Dried beans, peas, or lentils. Unsalted nuts, nut butters, and seeds. Unsalted canned beans. Lean cuts of beef with fat trimmed off. Low-sodium, lean deli meat.  Dairy  Low-fat (1%) or fat-free (skim) milk. Fat-free, low-fat, or reduced-fat cheeses. Nonfat, low-sodium ricotta or cottage cheese. Low-fat or nonfat yogurt. Low-fat, low-sodium cheese.  Fats and oils  Soft margarine without trans fats. Vegetable oil. Low-fat, reduced-fat, or light mayonnaise and salad dressings (reduced-sodium). Canola, safflower, olive, soybean, and sunflower oils. Avocado.  Seasoning and other foods  Herbs. Spices. Seasoning mixes without salt. Unsalted popcorn and pretzels. Fat-free sweets.  What foods are not recommended?  The items listed may not be a complete list. Talk with your dietitian about what dietary choices are best for you.  Grains  Baked goods made with fat, such as croissants, muffins, or some breads. Dry pasta or rice meal packs.  Vegetables  Creamed or fried vegetables. Vegetables in a cheese sauce. Regular canned vegetables (not low-sodium or reduced-sodium). Regular canned tomato sauce and paste (not low-sodium or reduced-sodium). Regular tomato and vegetable juice (not low-sodium or reduced-sodium). Pickles. Olives.  Fruits  Canned fruit in a light or heavy syrup. Fried fruit. Fruit in cream or butter sauce.  Meat and other protein foods  Fatty cuts of meat. Ribs. Fried meat. Bacon. Sausage. Bologna and other processed lunch meats. Salami. Fatback. Hotdogs. Bratwurst. Salted nuts and seeds. Canned beans with added salt. Canned or smoked fish. Whole eggs or egg yolks. Chicken or turkey with skin.  Dairy  Whole or 2% milk, cream, and half-and-half. Whole or full-fat cream cheese. Whole-fat or sweetened yogurt. Full-fat cheese. Nondairy creamers. Whipped toppings.  Processed cheese and cheese spreads.  Fats and oils  Butter. Stick margarine. Lard. Shortening. Ghee. Bacon fat. Tropical oils, such as coconut, palm kernel, or palm oil.  Seasoning and other foods  Salted popcorn and pretzels. Onion salt, garlic salt, seasoned salt, table salt, and sea salt. Worcestershire sauce. Tartar sauce. Barbecue sauce. Teriyaki sauce. Soy sauce, including reduced-sodium. Steak sauce. Canned and packaged gravies. Fish sauce. Oyster sauce. Cocktail sauce. Horseradish that you find on the shelf. Ketchup. Mustard. Meat flavorings and tenderizers. Bouillon cubes. Hot sauce and Tabasco sauce. Premade or packaged marinades. Premade or packaged taco seasonings. Relishes. Regular salad dressings.  Where to find more information:   National Heart, Lung, and Blood Institute: www.nhlbi.nih.gov   American Heart Association: www.heart.org  Summary   The DASH eating plan is a healthy eating plan that has been shown to reduce high blood pressure (hypertension). It may also reduce your risk for type 2 diabetes, heart disease, and stroke.   With the   DASH eating plan, you should limit salt (sodium) intake to 2,300 mg a day. If you have hypertension, you may need to reduce your sodium intake to 1,500 mg a day.   When on the DASH eating plan, aim to eat more fresh fruits and vegetables, whole grains, lean proteins, low-fat dairy, and heart-healthy fats.   Work with your health care provider or diet and nutrition specialist (dietitian) to adjust your eating plan to your individual calorie needs.  This information is not intended to replace advice given to you by your health care provider. Make sure you discuss any questions you have with your health care provider.  Document Released: 04/25/2011 Document Revised: 04/29/2016 Document Reviewed: 04/29/2016  Elsevier Interactive Patient Education  2019 [employer] Inc.

## 2018-11-11 NOTE — Progress Notes (Signed)
BP (!) 156/82   LMP  (LMP Unknown)   SpO2 95%    Subjective:    Patient ID: [name], female    DOB: 03/30/1941, 78 y.o.   MRN: [id number]  HPI: [name] is a 78 y.o. female  Chief Complaint  Patient presents with  . Follow-up  . Hypertension  . Insomnia    Wants to see about sleep aid for upcoming trip.     . This visit was completed via telephone due to the restrictions of the COVID-19 pandemic. All issues as above were discussed and addressed but no physical exam was performed. If it was felt that the patient should be evaluated in the office, they were directed there. The patient verbally consented to this visit. Patient was unable to complete an audio/visual visit due to Technical difficulties,Lack of internet. Due to the catastrophic nature of the COVID-19 pandemic, this visit was done through audio contact only. . Location of the patient: home . Location of the provider: work . Those involved with this call:  . Provider: Aura DialsJolene Cannady, DNP . CMA: Wilhemena DurieBrittany Russell, CMA . Front Desk/Registration: Harriet PhoJoliza Johnson  . Time spent on call: 15 minutes on the phone discussing health concerns. 10 minutes total spent in review of patient's record and preparation of their chart. I verified patient identity using two factors (patient name and date of birth). Patient consents verbally to being seen via telemedicine visit today.   HYPERTENSION Followed by CCM team and provider.  Attempted to discontinue Nebivolol 2.5 MG, but patient continued to take this as she reported increase in BP without it.  In morning takes 1/2 tablet of Irbesartan and 1/2 in the afternoon (taking total of 300 MG day) and then 1/2 tablet Bystolic in morning and 1/2 in the evening (total 5 MG a day).  Taking Lasix 40 MG once a day at this time due to increase lymphedema without massage therapy.  [facility] [place] massage and has not had one since February due to Covid restrictions.  Hypertension status: stable  Satisfied with current treatment? yes Duration of hypertension: chronic BP monitoring frequency:  daily BP range: 150/70-80's  BP medication side effects:  no Aspirin: yes Recurrent headaches: no Visual changes: no Palpitations: no Dyspnea: no Chest pain: no Lower extremity edema: baseline Dizzy/lightheaded: no   INSOMNIA: Melatonin is not really working with Wellbutrin.  Was taking Melatonin 6 MG and had incident of seeing spots when she woke-up.  She is planning on going on trip to beach and never sleeps well away from home.  Requesting alternative to Benadryl and Melatonin.  Educated on Trazodone as needed.  Duration: chronic Satisfied with sleep quality: no Difficulty falling asleep: yes Difficulty staying asleep: no Waking a few hours after sleep onset: sometimes Early morning awakenings: no Daytime hypersomnolence: no Wakes feeling refreshed: yes Good sleep hygiene: yes Apnea: no Snoring: no Depressed/anxious mood: no Recent stress: no Restless legs/nocturnal leg cramps: occasional cramps Chronic pain/arthritis: no History of sleep study: no Treatments attempted: melatonin and benadryl, she has cut back on Benadryl as advised by provider due to BEERS criteria    Relevant past medical, surgical, family and social history reviewed and updated as indicated. Interim medical history since our last visit reviewed. Allergies and medications reviewed and updated.  Review of Systems  Constitutional: Negative for activity change, appetite change, diaphoresis, fatigue and fever.  Respiratory: Negative for cough, chest tightness and shortness of breath.   Cardiovascular:  Positive for leg swelling (baseline lymphedema). Negative for chest pain and palpitations.  Gastrointestinal: Negative for abdominal distention, abdominal pain, constipation, diarrhea, nausea and vomiting.  Neurological: Negative for dizziness, syncope, weakness, light-headedness,  numbness and headaches.  Psychiatric/Behavioral: Positive for sleep disturbance.    Per HPI unless specifically indicated above     Objective:    BP (!) 156/82   LMP  (LMP Unknown)   SpO2 95%   Wt Readings from Last 3 Encounters:  06/08/18 (!) 357 lb (161.9 kg)  03/09/18 (!) 353 lb (160.1 kg)  01/02/18 (!) 355 lb (161 kg)    Physical Exam   Unable to perform due to poor connection, telephone only.  Results for orders placed or performed in visit on 08/28/18  VITAMIN D 25 Hydroxy (Vit-D Deficiency, Fractures)  Result Value Ref Range   Vit D, 25-Hydroxy 38.9 30.0 - 100.0 ng/mL      Assessment & Plan:   Problem List Items Addressed This Visit      Cardiovascular and Mediastinum   Essential hypertension, benign - Primary    Chronic, ongoing.  BP above goal, but patient just restarted previous med regimen.  Discussed goal for BP with patient.  Would avoid Amlodipine due to lymphedema.  Continue current medication regimen.  Could go up on Bystolic if needed at future visits, although monitor HR.        Other   Insomnia    Ongoing with poor response to Melatonin.  Recommend not taking Benadryl and she is cutting back, BEERS criteria.  Will trial as needed Trazodone 25 MG.  Return in 3 months for follow-up or sooner if needed.          Follow up plan: Return in about 3 months (around 02/11/2019) for HTN/HLD, insomnia.

## 2018-11-20 ENCOUNTER — Telehealth: Payer: Self-pay

## 2018-11-24 ENCOUNTER — Encounter: Payer: Self-pay | Admitting: Nurse Practitioner

## 2018-11-24 ENCOUNTER — Ambulatory Visit (INDEPENDENT_AMBULATORY_CARE_PROVIDER_SITE_OTHER): Payer: BC Managed Care – PPO | Admitting: Pharmacist

## 2018-11-24 DIAGNOSIS — I10 Essential (primary) hypertension: Secondary | ICD-10-CM

## 2018-11-24 DIAGNOSIS — F5101 Primary insomnia: Secondary | ICD-10-CM

## 2018-11-24 NOTE — Chronic Care Management (AMB) (Signed)
Chronic Care Management   Follow Up Note   11/24/2018 Name: Barbara ApplebaumBetty M Ketelsen MRN: [id number] DOB: 04/08/1941  Referred by: Marjie Skiffannady, [name] T, NP Reason for referral : Chronic Care Management (Medication Management)   Barbara ApplebaumBetty M Domagalski is a 78 y.o. year old female who is a primary care patient of Cannady, Dorie RankJolene T, NP. The CCM team was consulted for assistance with chronic disease management and care coordination needs.    Contacted patient telephonically to review medication changes. She also [facility] stating the medical necessity of her massage therapy for lymphedema. She notes that her massage therapist agreed to start seeing her again with a doctor's note.   Review of patient status, including review of consultants reports, relevant laboratory and other test results, and collaboration with appropriate care team members and the patient's provider was performed as part of comprehensive patient evaluation and provision of chronic care management services.    Goals Addressed            This Visit's Progress     Patient Stated   . "I have a problem with my pulse dropping and I get dizzy" (pt-stated)       Current Barriers:  . Hx bradycardia, HTN, multiple medications changes made to attempt to control BP without dropping HR . Patient is currently taking irbesartan 150 mg QAM, nebivolol 2.5 mg at noon, irbesartan 150 mg in the afternoon, and nebivolol 2.5 mg at bedtime. She believes this has resulted in the most stable blood pressures in a while, reporting BP 150s/70-80s with HR generally in the 60s . Per chart review, no specific indication for beta blocker therapy o Last cardiology visit with [name] on 08/12/2016, he noted that she "likely doesn't need beta blocker therapy"  Pharmacist Clinical Goal(s):  Marland Kitchen. Over the next 90 days, patient will work with PharmD and PCP to address needs related to optimal hypertension management  Interventions:  Discussed potential  future options with patient that we can discuss with primary care provider Aura DialsJolene Cannady, NP, as thoughts for future appointments:   Consider switching nebivolol to carvedilol for greater alpha peripheral effects on BP, without decreasing HR any more.  Alternatively, consider addition of spironolactone 12.5 mg daily. This may also provide some benefit for lymphedema. Would recommend BMP prior to initiation to evaluate potassium  Encouraged patient to continue checking BP/HR regularly and bring records to any future appointments.   Patient Self Care Activities:  . Self administers medications as prescribed . Calls pharmacy for medication refills  . Monitor blood pressure 2-3 times weekly and will monitor pulse  Please see past updates related to this goal by clicking on the "Past Updates" button in the selected goal      . "I take a lot of medications and I'm not sure I'm using them correctly" (pt-stated)       Current Barriers:  Marland Kitchen. Knowledge Deficits related to appropriate medication management   . Patient previously taking diphenhydramine every evening to help with leg itching/sleep; she then tried melatonin, but had visual hallucinations . At last primary care provider appointment, trazodone 25 mg QHS PRN sleep was initiated. Patient notes that she took it every night when she was out of town, and continues to take fairly regularly. She feels it has been extremely beneficial, and she notes she is "much less tearful". She wonders if she can take it every night.  Pharmacist Clinical Goal(s):   Marland Kitchen. Over the next 90 days,  patient will work with PCP and CCM team to improve appropriate use of safe and effective medications  Interventions: . Discussed with patient that daily low dose trazodone is safe, if she is finding it beneficial. Will notify primary care provider [name] to determine how/when she would like to follow up with patient about her mood   Patient Self Care Activities:  .  Does not self-administer prescription medications exactly as prescribed   Please see past updates related to this goal by clicking on the "Past Updates" button in the selected goal         Plan:  - Will address follow up appointment plans with primary care provider.  - Will follow up with patient in 4-6 weeks for continued medication management  Catie Darnelle Maffucci, PharmD Clinical Pharmacist [place] [phone]

## 2018-11-24 NOTE — Patient Instructions (Signed)
Visit Information  Goals Addressed            This Visit's Progress     Patient Stated   . "I have a problem with my pulse dropping and I get dizzy" (pt-stated)       Current Barriers:  . Hx bradycardia, HTN, multiple medications changes made to attempt to control BP without dropping HR . Patient is currently taking irbesartan 150 mg QAM, nebivolol 2.5 mg at noon, irbesartan 150 mg in the afternoon, and nebivolol 2.5 mg at bedtime. She believes this has resulted in the most stable blood pressures in a while, reporting BP 150s/70-80s with HR generally in the 60s . Per chart review, no specific indication for beta blocker therapy o Last cardiology visit with [name] on 08/12/2016, he noted that she "likely doesn't need beta blocker therapy"  Pharmacist Clinical Goal(s):  Marland Kitchen Over the next 90 days, patient will work with PharmD and PCP to address needs related to optimal hypertension management  Interventions:  Discussed potential future options with patient that we can discuss with primary care provider [name], NP, as thoughts for future appointments:   Consider switching nebivolol to carvedilol for greater alpha peripheral effects on BP, without decreasing HR any more.  Alternatively, consider addition of spironolactone 12.5 mg daily. This may also provide some benefit for lymphedema. Would recommend BMP prior to initiation to evaluate potassium  Encouraged patient to continue checking BP/HR regularly and bring records to any future appointments.   Patient Self Care Activities:  . Self administers medications as prescribed . Calls pharmacy for medication refills  . Monitor blood pressure 2-3 times weekly and will monitor pulse  Please see past updates related to this goal by clicking on the "Past Updates" button in the selected goal      . "I take a lot of medications and I'm not sure I'm using them correctly" (pt-stated)       Current Barriers:  Marland Kitchen Knowledge Deficits  related to appropriate medication management   . Patient previously taking diphenhydramine every evening to help with leg itching/sleep; she then tried melatonin, but had visual hallucinations . At last primary care provider appointment, trazodone 25 mg QHS PRN sleep was initiated. Patient notes that she took it every night when she was out of town, and continues to take fairly regularly. She feels it has been extremely beneficial, and she notes she is "much less tearful". She wonders if she can take it every night.  Pharmacist Clinical Goal(s):   Marland Kitchen Over the next 90 days, patient will work with PCP and CCM team to improve appropriate use of safe and effective medications  Interventions: . Discussed with patient that daily low dose trazodone is safe, if she is finding it beneficial. Will notify primary care provider [name] to determine how/when she would like to follow up with patient about her mood   Patient Self Care Activities:  . Does not self-administer prescription medications exactly as prescribed   Please see past updates related to this goal by clicking on the "Past Updates" button in the selected goal         The patient verbalized understanding of instructions provided today and declined a print copy of patient instruction materials.   Plan:  - Will address follow up appointment plans with primary care provider.  - Will follow up with patient in 4-6 weeks for continued medication management  Catie Darnelle Maffucci, PharmD Clinical Pharmacist [place] [phone]

## 2018-11-25 ENCOUNTER — Other Ambulatory Visit: Payer: Self-pay | Admitting: Unknown Physician Specialty

## 2018-11-25 ENCOUNTER — Other Ambulatory Visit (HOSPITAL_COMMUNITY): Payer: Self-pay | Admitting: Unknown Physician Specialty

## 2018-11-25 DIAGNOSIS — E041 Nontoxic single thyroid nodule: Secondary | ICD-10-CM

## 2018-11-25 DIAGNOSIS — B379 Candidiasis, unspecified: Secondary | ICD-10-CM | POA: Diagnosis not present

## 2018-11-27 ENCOUNTER — Telehealth: Payer: Self-pay

## 2018-12-03 ENCOUNTER — Ambulatory Visit
Admission: RE | Admit: 2018-12-03 | Discharge: 2018-12-03 | Disposition: A | Discharge status: Home / Self Care | Payer: Medicare Other | Source: Ambulatory Visit | Attending: Unknown Physician Specialty | Admitting: Unknown Physician Specialty

## 2018-12-03 ENCOUNTER — Other Ambulatory Visit: Payer: Self-pay

## 2018-12-03 DIAGNOSIS — E041 Nontoxic single thyroid nodule: Secondary | ICD-10-CM | POA: Diagnosis not present

## 2018-12-04 ENCOUNTER — Ambulatory Visit: Payer: BC Managed Care – PPO | Admitting: Nurse Practitioner

## 2018-12-09 ENCOUNTER — Other Ambulatory Visit (HOSPITAL_COMMUNITY): Payer: Self-pay | Admitting: Unknown Physician Specialty

## 2018-12-09 ENCOUNTER — Other Ambulatory Visit: Payer: Self-pay | Admitting: Unknown Physician Specialty

## 2018-12-09 DIAGNOSIS — E041 Nontoxic single thyroid nodule: Secondary | ICD-10-CM

## 2018-12-14 ENCOUNTER — Telehealth: Payer: BC Managed Care – PPO

## 2018-12-14 ENCOUNTER — Ambulatory Visit: Payer: Self-pay | Admitting: Licensed Clinical Social Worker

## 2018-12-14 NOTE — Chronic Care Management (AMB) (Signed)
  Care Management   Follow Up Note   12/14/2018 Name: [name] MRN: [id number] DOB: Sep 02, 1940  Referred by: Venita Lick, NP Reason for referral : Care Coordination   [name] is a 78 y.o. year old female who is a primary care patient of Cannady, Barbaraann Faster, NP. The care management team was consulted for assistance with care management and care coordination needs.    Review of patient status, including review of consultants reports, relevant laboratory and other test results, and collaboration with appropriate care team members and the patient's provider was performed as part of comprehensive patient evaluation and provision of chronic care management services.    LCSW completed CCM outreach attempt today but was unable to reach patient successfully. A HIPPA compliant voice message was left encouraging patient to return call once available. LCSW rescheduled CCM SW appointment as well.  A HIPPA compliant phone message was left for the patient providing contact information and requesting a return call.   Eula Fried, BSW, MSW, [facility] Care [facility] [email or web address] Phone: [phone]

## 2018-12-15 ENCOUNTER — Other Ambulatory Visit: Payer: Self-pay | Admitting: Nurse Practitioner

## 2018-12-15 ENCOUNTER — Other Ambulatory Visit: Payer: Self-pay | Admitting: Physician Assistant

## 2018-12-15 ENCOUNTER — Telehealth: Payer: Self-pay

## 2018-12-22 ENCOUNTER — Ambulatory Visit (INDEPENDENT_AMBULATORY_CARE_PROVIDER_SITE_OTHER): Payer: BC Managed Care – PPO | Admitting: Pharmacist

## 2018-12-22 ENCOUNTER — Other Ambulatory Visit: Payer: Self-pay

## 2018-12-22 ENCOUNTER — Telehealth: Payer: Self-pay

## 2018-12-22 ENCOUNTER — Ambulatory Visit (INDEPENDENT_AMBULATORY_CARE_PROVIDER_SITE_OTHER): Payer: BC Managed Care – PPO | Admitting: Nurse Practitioner

## 2018-12-22 ENCOUNTER — Encounter: Payer: Self-pay | Admitting: Nurse Practitioner

## 2018-12-22 VITALS — BP 144/82 | HR 60 | Temp 98.5°F

## 2018-12-22 DIAGNOSIS — F3340 Major depressive disorder, recurrent, in remission, unspecified: Secondary | ICD-10-CM | POA: Diagnosis not present

## 2018-12-22 DIAGNOSIS — I10 Essential (primary) hypertension: Secondary | ICD-10-CM

## 2018-12-22 DIAGNOSIS — Z6841 Body Mass Index (BMI) 40.0 and over, adult: Secondary | ICD-10-CM | POA: Diagnosis not present

## 2018-12-22 DIAGNOSIS — E559 Vitamin D deficiency, unspecified: Secondary | ICD-10-CM | POA: Diagnosis not present

## 2018-12-22 DIAGNOSIS — E785 Hyperlipidemia, unspecified: Secondary | ICD-10-CM

## 2018-12-22 DIAGNOSIS — K219 Gastro-esophageal reflux disease without esophagitis: Secondary | ICD-10-CM

## 2018-12-22 DIAGNOSIS — E538 Deficiency of other specified B group vitamins: Secondary | ICD-10-CM

## 2018-12-22 NOTE — Assessment & Plan Note (Signed)
Recheck B12 level today per patient request as has not been taking supplement recently and feels more fatigue.

## 2018-12-22 NOTE — Patient Instructions (Signed)
° °Calorie Counting for Weight Loss °Calories are units of energy. Your body needs a certain amount of calories from food to keep you going throughout the day. When you eat more calories than your body needs, your body stores the extra calories as fat. When you eat fewer calories than your body needs, your body burns fat to get the energy it needs. °Calorie counting means keeping track of how many calories you eat and drink each day. Calorie counting can be helpful if you need to lose weight. If you make sure to eat fewer calories than your body needs, you should lose weight. Ask your health care provider what a healthy weight is for you. °For calorie counting to work, you will need to eat the right number of calories in a day in order to lose a healthy amount of weight per week. A dietitian can help you determine how many calories you need in a day and will give you suggestions on how to reach your calorie goal. °· A healthy amount of weight to lose per week is usually 1-2 lb (0.5-0.9 kg). This usually means that your daily calorie intake should be reduced by 500-750 calories. °· Eating 1,200 - 1,500 calories per day can help most women lose weight. °· Eating 1,500 - 1,800 calories per day can help most men lose weight. °What is my plan? °My goal is to have __________ calories per day. °If I have this many calories per day, I should lose around __________ pounds per week. °What do I need to know about calorie counting? °In order to meet your daily calorie goal, you will need to: °· Find out how many calories are in each food you would like to eat. Try to do this before you eat. °· Decide how much of the food you plan to eat. °· Write down what you ate and how many calories it had. Doing this is called keeping a food log. °To successfully lose weight, it is important to balance calorie counting with a healthy lifestyle that includes regular activity. Aim for 150 minutes of moderate exercise (such as walking) or 75  minutes of vigorous exercise (such as running) each week. °Where do I find calorie information? ° °The number of calories in a food can be found on a Nutrition Facts label. If a food does not have a Nutrition Facts label, try to look up the calories online or ask your dietitian for help. °Remember that calories are listed per serving. If you choose to have more than one serving of a food, you will have to multiply the calories per serving by the amount of servings you plan to eat. For example, the label on a package of bread might say that a serving size is 1 slice and that there are 90 calories in a serving. If you eat 1 slice, you will have eaten 90 calories. If you eat 2 slices, you will have eaten 180 calories. °How do I keep a food log? °Immediately after each meal, record the following information in your food log: °· What you ate. Don't forget to include toppings, sauces, and other extras on the food. °· How much you ate. This can be measured in cups, ounces, or number of items. °· How many calories each food and drink had. °· The total number of calories in the meal. °Keep your food log near you, such as in a small notebook in your pocket, or use a mobile app or website. Some programs will   calculate calories for you and show you how many calories you have left for the day to meet your goal. °What are some calorie counting tips? ° °· Use your calories on foods and drinks that will fill you up and not leave you hungry: °? Some examples of foods that fill you up are nuts and nut butters, vegetables, lean proteins, and high-fiber foods like whole grains. High-fiber foods are foods with more than 5 g fiber per serving. °? Drinks such as sodas, specialty coffee drinks, alcohol, and juices have a lot of calories, yet do not fill you up. °· Eat nutritious foods and avoid empty calories. Empty calories are calories you get from foods or beverages that do not have many vitamins or protein, such as candy, sweets, and  soda. It is better to have a nutritious high-calorie food (such as an avocado) than a food with few nutrients (such as a bag of chips). °· Know how many calories are in the foods you eat most often. This will help you calculate calorie counts faster. °· Pay attention to calories in drinks. Low-calorie drinks include water and unsweetened drinks. °· Pay attention to nutrition labels for "low fat" or "fat free" foods. These foods sometimes have the same amount of calories or more calories than the full fat versions. They also often have added sugar, starch, or salt, to make up for flavor that was removed with the fat. °· Find a way of tracking calories that works for you. Get creative. Try different apps or programs if writing down calories does not work for you. °What are some portion control tips? °· Know how many calories are in a serving. This will help you know how many servings of a certain food you can have. °· Use a measuring cup to measure serving sizes. You could also try weighing out portions on a kitchen scale. With time, you will be able to estimate serving sizes for some foods. °· Take some time to put servings of different foods on your favorite plates, bowls, and cups so you know what a serving looks like. °· Try not to eat straight from a bag or box. Doing this can lead to overeating. Put the amount you would like to eat in a cup or on a plate to make sure you are eating the right portion. °· Use smaller plates, glasses, and bowls to prevent overeating. °· Try not to multitask (for example, watch TV or use your computer) while eating. If it is time to eat, sit down at a table and enjoy your food. This will help you to know when you are full. It will also help you to be aware of what you are eating and how much you are eating. °What are tips for following this plan? °Reading food labels °· Check the calorie count compared to the serving size. The serving size may be smaller than what you are used to  eating. °· Check the source of the calories. Make sure the food you are eating is high in vitamins and protein and low in saturated and trans fats. °Shopping °· Read nutrition labels while you shop. This will help you make healthy decisions before you decide to purchase your food. °· Make a grocery list and stick to it. °Cooking °· Try to cook your favorite foods in a healthier way. For example, try baking instead of frying. °· Use low-fat dairy products. °Meal planning °· Use more fruits and vegetables. Half of your plate should be   fruits and vegetables. °· Include lean proteins like poultry and fish. °How do I count calories when eating out? °· Ask for smaller portion sizes. °· Consider sharing an entree and sides instead of getting your own entree. °· If you get your own entree, eat only half. Ask for a box at the beginning of your meal and put the rest of your entree in it so you are not tempted to eat it. °· If calories are listed on the menu, choose the lower calorie options. °· Choose dishes that include vegetables, fruits, whole grains, low-fat dairy products, and lean protein. °· Choose items that are boiled, broiled, grilled, or steamed. Stay away from items that are buttered, battered, fried, or served with cream sauce. Items labeled "crispy" are usually fried, unless stated otherwise. °· Choose water, low-fat milk, unsweetened iced tea, or other drinks without added sugar. If you want an alcoholic beverage, choose a lower calorie option such as a glass of wine or light beer. °· Ask for dressings, sauces, and syrups on the side. These are usually high in calories, so you should limit the amount you eat. °· If you want a salad, choose a garden salad and ask for grilled meats. Avoid extra toppings like bacon, cheese, or fried items. Ask for the dressing on the side, or ask for olive oil and vinegar or lemon to use as dressing. °· Estimate how many servings of a food you are given. For example, a serving of  cooked rice is ½ cup or about the size of half a baseball. Knowing serving sizes will help you be aware of how much food you are eating at restaurants. The list below tells you how big or small some common portion sizes are based on everyday objects: °? 1 oz--4 stacked dice. °? 3 oz--1 deck of cards. °? 1 tsp--1 die. °? 1 Tbsp--½ a ping-pong ball. °? 2 Tbsp--1 ping-pong ball. °? ½ cup--½ baseball. °? 1 cup--1 baseball. °Summary °· Calorie counting means keeping track of how many calories you eat and drink each day. If you eat fewer calories than your body needs, you should lose weight. °· A healthy amount of weight to lose per week is usually 1-2 lb (0.5-0.9 kg). This usually means reducing your daily calorie intake by 500-750 calories. °· The number of calories in a food can be found on a Nutrition Facts label. If a food does not have a Nutrition Facts label, try to look up the calories online or ask your dietitian for help. °· Use your calories on foods and drinks that will fill you up, and not on foods and drinks that will leave you hungry. °· Use smaller plates, glasses, and bowls to prevent overeating. °This information is not intended to replace advice given to you by your health care provider. Make sure you discuss any questions you have with your health care provider. °Document Released: 05/06/2005 Document Revised: 01/23/2018 Document Reviewed: 04/05/2016 °Elsevier Patient Education © 2020 [employer] ° °

## 2018-12-22 NOTE — Assessment & Plan Note (Signed)
Chronic, stable with good control.  Continue current medication regimen and adjust as needed.  Check mag level yearly.   

## 2018-12-22 NOTE — Patient Instructions (Signed)
Visit Information  Goals Addressed            This Visit's Progress     Patient Stated   . "I have a problem with my pulse dropping and I get dizzy" (pt-stated)       Current Barriers:  . Hx bradycardia, HTN, multiple medications changes made to attempt to control BP without dropping HR . Patient is currently taking irbesartan 150 mg QAM, nebivolol 2.5 mg at noon, irbesartan 150 mg in the afternoon, and nebivolol 2.5 mg at bedtime. Most stable BP/HR in a while  o Reports that she is particularly concerned with her HR dropping, as she was with her husband when he had a heart attack and it dropped to the 59s . Per chart review, no specific indication for beta blocker therapy o Last cardiology visit with [name] on 08/12/2016, he noted that she "likely doesn't need beta blocker therapy"  Pharmacist Clinical Goal(s):  Marland Kitchen Over the next 90 days, patient will work with PharmD and PCP to address needs related to optimal hypertension management  Interventions:  Congratulated patient on maintenance of goal BP/HR with current regimen. Encouraged to continue checking, and to try to not worry as much about lower HR  Patient Self Care Activities:  . Self administers medications as prescribed . Calls pharmacy for medication refills  . Monitor blood pressure 2-3 times weekly and will monitor pulse  Please see past updates related to this goal by clicking on the "Past Updates" button in the selected goal         The patient verbalized understanding of instructions provided today and declined a print copy of patient instruction materials.     Plan:  - Will continue to collaborate with CCM team to outreach patient in 4-6 weeks  Catie Darnelle Maffucci, PharmD Clinical Pharmacist [place] [phone]

## 2018-12-22 NOTE — Assessment & Plan Note (Signed)
Chronic, stable.  Continue current medication regimen and adjust as needed based on mood.  Denies SI/HI.   

## 2018-12-22 NOTE — Assessment & Plan Note (Signed)
Continue daily supplement and recheck level next visit.

## 2018-12-22 NOTE — Chronic Care Management (AMB) (Signed)
  Chronic Care Management   Follow Up Note   12/22/2018 Name: [name] MRN: [id number] DOB: Oct 17, 1940  Referred by: Venita Lick, NP Reason for referral : Chronic Care Management (Medication Management)   [name] is a 78 y.o. year old female who is a primary care patient of Cannady, Barbaraann Faster, NP. The CCM team was consulted for assistance with chronic disease management and care coordination needs.    Met with patient face to face during provider appointment today.   Review of patient status, including review of consultants reports, relevant laboratory and other test results, and collaboration with appropriate care team members and the patient's provider was performed as part of comprehensive patient evaluation and provision of chronic care management services.    Goals Addressed            This Visit's Progress     Patient Stated   . "I have a problem with my pulse dropping and I get dizzy" (pt-stated)       Current Barriers:  . Hx bradycardia, HTN, multiple medications changes made to attempt to control BP without dropping HR . Patient is currently taking irbesartan 150 mg QAM, nebivolol 2.5 mg at noon, irbesartan 150 mg in the afternoon, and nebivolol 2.5 mg at bedtime. Most stable BP/HR in a while  o Reports that she is particularly concerned with her HR dropping, as she was with her husband when he had a heart attack and it dropped to the 69s . Per chart review, no specific indication for beta blocker therapy o Last cardiology visit with [name] on 08/12/2016, he noted that she "likely doesn't need beta blocker therapy"  Pharmacist Clinical Goal(s):  Marland Kitchen Over the next 90 days, patient will work with PharmD and PCP to address needs related to optimal hypertension management  Interventions:  Congratulated patient on maintenance of goal BP/HR with current regimen. Encouraged to continue checking, and to try to not worry as much about lower HR  Patient Self Care  Activities:  . Self administers medications as prescribed . Calls pharmacy for medication refills  . Monitor blood pressure 2-3 times weekly and will monitor pulse  Please see past updates related to this goal by clicking on the "Past Updates" button in the selected goal         Plan:  - Will continue to collaborate with CCM team to outreach patient in 4-6 weeks  Catie Darnelle Maffucci, PharmD Clinical Pharmacist [place] [phone]

## 2018-12-22 NOTE — Assessment & Plan Note (Signed)
Chronic, ongoing with majority at home BP readings at goal for age and in office today at goal.  Continue current medication regimen and adjust as needed.  CMP today.

## 2018-12-22 NOTE — Assessment & Plan Note (Signed)
Chronic, ongoing.  Continue current medication regimen and adjust as needed.  Labs today. 

## 2018-12-22 NOTE — Assessment & Plan Note (Signed)
Recommend continued focus on health diet choices and regular physical activity (30 minutes 5 days a week). 

## 2018-12-22 NOTE — Progress Notes (Signed)
BP (!) 144/82   Pulse 60   Temp 98.5 F (36.9 C) (Oral)   LMP  (LMP Unknown)   SpO2 98%    Subjective:    Patient ID: [name], female    DOB: 03/07/1941, 78 y.o.   MRN: [id number]  HPI: Barbara ApplebaumBetty M Brisbane is a 78 y.o. female  Chief Complaint  Patient presents with  . Depression  . Gastroesophageal Reflux  . Hyperlipidemia  . Hypertension  . vitamin d   HYPERTENSION / HYPERLIPIDEMIA Continues on Furosemide, ASA, Avapro, Bystolic, and Atorvastatin. Satisfied with current treatment? yes Duration of hypertension: chronic BP monitoring frequency: daily BP range: 140/80's at home with occasional elevations BP medication side effects: no Duration of hyperlipidemia: chronic Cholesterol medication side effects: no Cholesterol supplements: none Medication compliance: good compliance Aspirin: no Recent stressors: no Recurrent headaches: no Visual changes: no Palpitations: no Dyspnea: no Chest pain: no Lower extremity edema: no Dizzy/lightheaded: no   DEPRESSION Continues on Wellbutrin and Trazodone 25 MG at bedtime as needed.  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 Mood status: stable Satisfied with current treatment?: yes Symptom severity: mild  Duration of current treatment : chronic Side effects: no Medication compliance: good compliance Psychotherapy/counseling: none Depressed mood: no Anxious mood: no Anhedonia: no Significant weight loss or gain: no Insomnia: yes hard to fall asleep Fatigue: no Feelings of worthlessness or guilt: no Impaired concentration/indecisiveness: no Suicidal ideations: no Hopelessness: no Crying spells: no [facility] screen [facility] 12/22/2018 03/09/2018 03/09/2018 01/01/2018 12/05/2017  Decreased Interest 0 0 0 0 0  Down, Depressed, Hopeless 0 0 0 0 0  PHQ - 2 Score 0 0 0 0 0  Altered sleeping 0 1 1 1  0  Tired, decreased energy 2 2 2  0 0  Change in appetite 0 0 0 0 0   Feeling bad or failure about yourself  0 0 0 0 0  Trouble concentrating 0 0 0 0 0  Moving slowly or fidgety/restless 0 0 0 0 0  Suicidal thoughts 0 0 0 0 0  PHQ-9 Score 2 3 3 1  0  Difficult doing work/chores Not difficult at all - - - -  Some recent data might be hidden   GERD Continues on Prilosec 20 MG daily. GERD control status: controlled  Satisfied with current treatment? yes Heartburn frequency: none Medication side effects: no  Medication compliance: stable Previous GERD medications: Dysphagia: no Odynophagia:  no Hematemesis: no Blood in stool: no EGD: no  VITAMIN D DEFICIENCY: Last level improved at 38.9 and is continuing daily supplement.  Denies muscle pain or recent falls.  Relevant past medical, surgical, family and social history reviewed and updated as indicated. Interim medical history since our last visit reviewed. Allergies and medications reviewed and updated.  Review of Systems  Constitutional: Negative for activity change, appetite change, diaphoresis, fatigue and fever.  Respiratory: Negative for cough, chest tightness and shortness of breath.   Cardiovascular: Negative for chest pain, palpitations and leg swelling.  Gastrointestinal: Negative for abdominal distention, abdominal pain, constipation, diarrhea, nausea and vomiting.  Endocrine: Negative for cold intolerance, heat intolerance, polydipsia, polyphagia and polyuria.  Neurological: Negative for dizziness, syncope, weakness, light-headedness, numbness and headaches.  Psychiatric/Behavioral: Negative.     Per HPI unless specifically indicated above     Objective:    BP (!) 144/82   Pulse 60   Temp 98.5 F (36.9 C) (Oral)   LMP  (  LMP Unknown)   SpO2 98%   Wt Readings from Last 3 Encounters:  06/08/18 (!) 357 lb (161.9 kg)  03/09/18 (!) 353 lb (160.1 kg)  01/02/18 (!) 355 lb (161 kg)    Physical Exam Vitals signs and nursing note reviewed.  Constitutional:      General: She is awake.  She is not in acute distress.    Appearance: She is well-developed. She is morbidly obese. She is not ill-appearing.  HENT:     Head: Normocephalic.     Right Ear: Hearing normal.     Left Ear: Hearing normal.     Nose: Nose normal.     Mouth/Throat:     Mouth: Mucous membranes are moist.  Eyes:     General: Lids are normal.        Right eye: No discharge.        Left eye: No discharge.     Conjunctiva/sclera: Conjunctivae normal.     Pupils: Pupils are equal, round, and reactive to light.  Neck:     Musculoskeletal: Normal range of motion and neck supple.     Thyroid: No thyromegaly.     Vascular: No carotid bruit.  Cardiovascular:     Rate and Rhythm: Normal rate and regular rhythm.     Heart sounds: Normal heart sounds. No murmur. No gallop.   Pulmonary:     Effort: Pulmonary effort is normal. No accessory muscle usage or respiratory distress.     Breath sounds: Normal breath sounds.  Abdominal:     General: Bowel sounds are normal.     Palpations: Abdomen is soft.  Musculoskeletal:     Right lower leg: 3+ Edema present.     Left lower leg: 3+ Edema present.  Lymphadenopathy:     Cervical: No cervical adenopathy.  Skin:    General: Skin is warm and dry.  Neurological:     Mental Status: She is alert and oriented to person, place, and time.  Psychiatric:        Attention and Perception: Attention normal.        Mood and Affect: Mood normal.        Speech: Speech normal.        Behavior: Behavior normal. Behavior is cooperative.        Thought Content: Thought content normal.        Judgment: Judgment normal.     Results for orders placed or performed in visit on 08/28/18  VITAMIN D 25 Hydroxy (Vit-D Deficiency, Fractures)  Result Value Ref Range   Vit D, 25-Hydroxy 38.9 30.0 - 100.0 ng/mL      Assessment & Plan:   Problem List Items Addressed This Visit      Cardiovascular and Mediastinum   Essential hypertension, benign    Chronic, ongoing with majority  at home BP readings at goal for age and in office today at goal.  Continue current medication regimen and adjust as needed.  CMP today.        Relevant Orders   Comprehensive metabolic panel     Digestive   GERD without esophagitis    Chronic, stable with good control.  Continue current medication regimen and adjust as needed.  Check mag level yearly.          Other   Depression - Primary    Chronic, stable.  Continue current medication regimen and adjust as needed based on mood.  Denies SI/HI.        BMI  60.0-69.9, adult [facility](HCC)    Recommend continued focus on health diet choices and regular physical activity (30 minutes 5 days a week).       Hyperlipidemia    Chronic, ongoing.  Continue current medication regimen and adjust as needed.  Labs today.        Relevant Orders   Lipid Panel w/o Chol/HDL Ratio   Comprehensive metabolic panel   Vitamin B12 deficiency    Recheck B12 level today per patient request as has not been taking supplement recently and feels more fatigue.      Relevant Orders   Vitamin B12   Vitamin D deficiency    Continue daily supplement and recheck level next visit.          Follow up plan: Return in about 3 months (around 03/24/2019) for HTN/HLD, and weight loss.

## 2018-12-23 LAB — COMPREHENSIVE METABOLIC PANEL
ALT: 13 IU/L (ref 0–32)
AST: 13 IU/L (ref 0–40)
Albumin/Globulin Ratio: 2 (ref 1.2–2.2)
Albumin: 4.6 g/dL (ref 3.7–4.7)
Alkaline Phosphatase: 92 IU/L (ref 39–117)
BUN/Creatinine Ratio: 24 (ref 12–28)
BUN: 22 mg/dL (ref 8–27)
Bilirubin Total: 0.6 mg/dL (ref 0.0–1.2)
CO2: 19 mmol/L — ABNORMAL LOW (ref 20–29)
Calcium: 10.6 mg/dL — ABNORMAL HIGH (ref 8.7–10.3)
Chloride: 102 mmol/L (ref 96–106)
Creatinine, Ser: 0.91 mg/dL (ref 0.57–1.00)
GFR calc Af Amer: 70 mL/min/{1.73_m2} (ref >59)
GFR calc non Af Amer: 61 mL/min/{1.73_m2} (ref >59)
Globulin, Total: 2.3 g/dL (ref 1.5–4.5)
Glucose: 87 mg/dL (ref 65–99)
Potassium: 4.9 mmol/L (ref 3.5–5.2)
Sodium: 137 mmol/L (ref 134–144)
Total Protein: 6.9 g/dL (ref 6.0–8.5)

## 2018-12-23 LAB — VITAMIN B12: Vitamin B-12: 642 pg/mL (ref 232–1245)

## 2018-12-23 LAB — LIPID PANEL W/O CHOL/HDL RATIO
Cholesterol, Total: 195 mg/dL (ref 100–199)
HDL: 95 mg/dL (ref >39)
LDL Calculated: 82 mg/dL (ref 0–99)
Triglycerides: 90 mg/dL (ref 0–149)
VLDL Cholesterol Cal: 18 mg/dL (ref 5–40)

## 2019-01-06 ENCOUNTER — Other Ambulatory Visit: Payer: Self-pay | Admitting: Nurse Practitioner

## 2019-01-07 ENCOUNTER — Ambulatory Visit: Payer: BC Managed Care – PPO | Admitting: Cardiovascular Disease

## 2019-01-13 ENCOUNTER — Telehealth: Payer: Self-pay

## 2019-01-18 ENCOUNTER — Ambulatory Visit: Payer: Self-pay | Admitting: Licensed Clinical Social Worker

## 2019-01-18 ENCOUNTER — Telehealth: Payer: Self-pay

## 2019-01-18 NOTE — Chronic Care Management (AMB) (Signed)
  Chronic Care Management    Clinical Social Work Follow Up Note  01/18/2019 Name: [name] MRN: [id number] DOB: Jun 23, 1940  [name] is a 78 y.o. year old female who is a primary care patient of Cannady, Barbaraann Faster, NP. The CCM team was consulted for assistance with [employer] .   Review of patient status, including review of consultants reports, other relevant assessments, and collaboration with appropriate care team members and the patient's provider was performed as part of comprehensive patient evaluation and provision of chronic care management services.    Outpatient Encounter Medications as of 01/18/2019  Medication Sig Note  . albuterol (PROVENTIL) (5 MG/ML) 0.5% nebulizer solution Take 0.5 mLs (2.5 mg total) by nebulization every 6 (six) hours as needed for wheezing or shortness of breath.   . Apple Cider Vinegar 500 MG TABS Take 500 mg by mouth 2 (two) times a day.    Marland Kitchen aspirin 81 MG tablet Take 81 mg by mouth daily.   Marland Kitchen atorvastatin (LIPITOR) 10 MG tablet TAKE 1 TABLET(10 MG) BY MOUTH DAILY   . buPROPion (WELLBUTRIN SR) 150 MG 12 hr tablet Take 150 mg by mouth 2 (two) times daily. 11/24/2018: Only taking QAM  . Cholecalciferol (VITAMIN D-3 PO) Take 2,000 Units by mouth daily.    . furosemide (LASIX) 40 MG tablet Take 1 tablet (40 mg total) by mouth daily as needed.   . irbesartan (AVAPRO) 300 MG tablet TAKE 1/2 TABLET BY MOUTH DAILY   . magnesium oxide (MAG-OX) 400 MG tablet Take 400 mg by mouth 2 (two) times a day.    . nebivolol (BYSTOLIC) 5 MG tablet Take 5 mg by mouth daily. 11/24/2018: Taking 1/2 tab QHS, 1/2 tab QPM  . omeprazole (PRILOSEC) 20 MG capsule TAKE 1 CAPSULE(20 MG) BY MOUTH DAILY   . Probiotic Product (PROBIOTIC PO) Take 1 tablet by mouth daily.   . Tiotropium Bromide Monohydrate (SPIRIVA RESPIMAT) 1.25 MCG/ACT AERS Inhale 2 puffs into the lungs daily.   . traZODone (DESYREL) 50 MG tablet Take 0.5 tablets (25 mg total) by mouth at bedtime as needed for  sleep.   Marland Kitchen triamcinolone cream (KENALOG) 0.1 % APPLY TOPICALLY TO THE AFFECTED AREA TWICE DAILY   . UNABLE TO FIND Take 1 tablet by mouth 2 (two) times daily. Vitmain B12- 2000 units Folate- 2000 mg Vitamin B6- 2 mg   . vitamin E 400 UNIT capsule Take 400 Units by mouth daily.   Grant Ruts INHUB 250-50 MCG/DOSE AEPB INHALE 1 PUFF INTO THE LUNGS TWICE DAILY    No facility-administered encounter medications on file as of 01/18/2019.    LCSW completed CCM outreach attempt today but was unable to reach patient successfully. A HIPPA compliant voice message was left encouraging patient to return call once available. LCSW rescheduled CCM SW appointment as well.  Follow Up Plan: SW will follow up with patient by phone over the next 60 days  Eula Fried, [place], MSW, [email or web address] Phone: [phone]

## 2019-01-20 ENCOUNTER — Telehealth: Payer: Self-pay

## 2019-02-08 ENCOUNTER — Ambulatory Visit: Payer: BC Managed Care – PPO | Admitting: Cardiovascular Disease

## 2019-02-10 ENCOUNTER — Telehealth: Payer: Medicare Other

## 2019-02-10 ENCOUNTER — Ambulatory Visit: Payer: Self-pay | Admitting: *Deleted

## 2019-02-10 ENCOUNTER — Ambulatory Visit: Payer: BC Managed Care – PPO | Admitting: Nurse Practitioner

## 2019-02-10 DIAGNOSIS — I10 Essential (primary) hypertension: Secondary | ICD-10-CM

## 2019-02-10 NOTE — Chronic Care Management (AMB) (Signed)
  Chronic Care Management   Outreach Note  02/10/2019 Name: [name] MRN: [id number] DOB: 10-12-1940  Referred by: Venita Lick, NP Reason for referral : Chronic Care Management (Unsccessful Outreach x1 )   An unsuccessful telephone outreach was attempted today. The patient was referred to the case management team by for assistance with chronic care management and care coordination.   Follow Up Plan: A HIPPA compliant phone message was left for the patient providing contact information and requesting a return call.  The care management team will reach out to the patient again over the next 30 days.   Merlene Morse Katilin Raynes RN, BSN Nurse Case Editor, [facility] Care Management  [phone]) [employer]

## 2019-02-16 NOTE — Progress Notes (Signed)
Virtual Visit via Video Note   This visit type was conducted due to national recommendations for restrictions regarding the COVID-19 Pandemic (e.g. social distancing) in an effort to limit this patient's exposure and mitigate transmission in our community.  Due to her co-morbid illnesses, this patient is at least at moderate risk for complications without adequate follow up.  This format is felt to be most appropriate for this patient at this time.  All issues noted in this document were discussed and addressed.  A limited physical exam was performed with this format.  Please refer to the patient's chart for her consent to telehealth for [facility].   I connected with  [name] on 02/17/19 by a video enabled telemedicine application and verified that I am speaking with the correct person using two identifiers. I discussed the limitations of evaluation and management by telemedicine. The patient expressed understanding and agreed to proceed.   Evaluation Performed:  Follow-up visit  Date:  02/17/2019   ID:  Barbara, [name] 23-Jun-1940, MRN [id number]  Patient Location:  [address] [place] [address]   Provider location:   [facility], [place] office  PCP:  Marjie Skiff, NP  Cardiologist:  Fonnie Mu   Chief Complaint:  Leg swelling   History of Present Illness:    [name] is a 78 y.o. female who presents via audio/video conferencing for a telehealth visit today.   The patient does not symptoms concerning for COVID-19 infection (fever, chills, cough, or new SHORTNESS OF BREATH).   Patient has a past medical history of Morbid obesity Chronic Leg edema Lymphedema HTN Non smoker Severe sleep apnea, followed by pulmonary Extensive secondhand smoke exposure for approximately 60 years Presents for follow-up of her aortic atherosclerosis  Last seen March 2018 Retired in 06/26/2020Husband passed away one year ago  Has not been out much  as she has [facility] doctor to [employer] one week  Legs wake her up at night  On lasix  As needed Lymphedema, arms and legs Does not use compression pumps  Sleeves do not fit  On nutrisystem, water Does not get hungry  144/60, pulse pulse in the 50 Ox 96 to 98%  Labs reviewed Total 195, LDL 82   CXR  The heart is enlarged but stable. There is moderate tortuosity, ectasia and calcification of the thoracic aorta.  Denies having any symptoms of orthostasis when her heart rate is slow Heart rate increases up to 80 bpm with ambulation She monitors heart rate with wrist watch  No regular exercise program, uses a walker in her house Denies any chest pain, shortness of breath on exertion Family is moving in with her   Prior CV studies:   The following studies were reviewed today:   Past Medical History:  Diagnosis Date  . Allergy   . Arthritis   . Asthma   . Chronic fatigue syndrome   . Fractured fibula   . GERD (gastroesophageal reflux disease)   . Hypertension   . Lymphedema 2004  . Neuromuscular disorder (HCC)   . Pneumonia    Past Surgical History:  Procedure Laterality Date  . ABDOMINAL HYSTERECTOMY  1974  . BIOPSY THYROID    . CHOLECYSTECTOMY  2005     Allergies:   Latex, Benicar [olmesartan], Ciprofloxacin, Diovan [valsartan], Flagyl [metronidazole], Lotensin [benazepril hcl], Penicillins, and Sulfa antibiotics   Social History   Tobacco Use  . Smoking status:  Never Smoker  . Smokeless tobacco: Never Used  Substance Use Topics  . Alcohol use: No    Alcohol/week: 0.0 standard drinks  . Drug use: No     Current Outpatient Medications on File Prior to Visit  Medication Sig Dispense Refill  . albuterol (PROVENTIL) (5 MG/ML) 0.5% nebulizer solution Take 0.5 mLs (2.5 mg total) by nebulization every 6 (six) hours as needed for wheezing or shortness of breath. 20 mL 12  . Apple Cider Vinegar 500 MG TABS Take 500 mg by mouth 2 (two) times a day.     Marland Kitchen aspirin 81  MG tablet Take 81 mg by mouth daily.    Marland Kitchen atorvastatin (LIPITOR) 10 MG tablet TAKE 1 TABLET(10 MG) BY MOUTH DAILY 90 tablet 0  . buPROPion (WELLBUTRIN SR) 150 MG 12 hr tablet Take 150 mg by mouth 2 (two) times daily.    . Cholecalciferol (VITAMIN D-3 PO) Take 2,000 Units by mouth daily.     . furosemide (LASIX) 40 MG tablet Take 1 tablet (40 mg total) by mouth daily as needed. 90 tablet 2  . irbesartan (AVAPRO) 300 MG tablet TAKE 1/2 TABLET BY MOUTH DAILY 90 tablet 0  . magnesium oxide (MAG-OX) 400 MG tablet Take 400 mg by mouth 2 (two) times a day.     . nebivolol (BYSTOLIC) 5 MG tablet Take 5 mg by mouth daily.    Marland Kitchen omeprazole (PRILOSEC) 20 MG capsule TAKE 1 CAPSULE(20 MG) BY MOUTH DAILY 90 capsule 0  . Probiotic Product (PROBIOTIC PO) Take 1 tablet by mouth daily.    . Tiotropium Bromide Monohydrate (SPIRIVA RESPIMAT) 1.25 MCG/ACT AERS Inhale 2 puffs into the lungs daily. 1 Inhaler 5  . traZODone (DESYREL) 50 MG tablet Take 0.5 tablets (25 mg total) by mouth at bedtime as needed for sleep. 30 tablet 2  . triamcinolone cream (KENALOG) 0.1 % APPLY TOPICALLY TO THE AFFECTED AREA TWICE DAILY 454 g 0  . UNABLE TO FIND Take 1 tablet by mouth 2 (two) times daily. Vitmain B12- 2000 units Folate- 2000 mg Vitamin B6- 2 mg    . vitamin E 400 UNIT capsule Take 400 Units by mouth daily.    Grant Ruts INHUB 250-50 MCG/DOSE AEPB INHALE 1 PUFF INTO THE LUNGS TWICE DAILY 1 each 5   No current facility-administered medications on file prior to visit.      Family Hx: The patient's family history includes Alcoholism in her father; Cancer in her brother; Diabetes in her father; Heart disease in her mother; Hyperlipidemia in her mother; Hypertension in her son; Stroke in her maternal grandmother.  ROS:   Please see the history of present illness.    Review of Systems  Constitutional: Negative.   HENT: Negative.   Respiratory: Negative.   Cardiovascular: Positive for leg swelling.  Gastrointestinal:  Negative.   Musculoskeletal: Negative.   Neurological: Negative.   Psychiatric/Behavioral: Negative.   All other systems reviewed and are negative.    Labs/Other Tests and Data Reviewed:    Recent Labs: 03/09/2018: Magnesium 1.9 06/08/2018: Hemoglobin 15.0; Platelets 229; TSH 2.730 12/22/2018: ALT 13; BUN 22; Creatinine, Ser 0.91; Potassium 4.9; Sodium 137   Recent Lipid Panel Lab Results  Component Value Date/Time   CHOL 195 12/22/2018 04:48 PM   CHOL 213 (H) 02/12/2017 09:39 AM   TRIG 90 12/22/2018 04:48 PM   TRIG 115 02/12/2017 09:39 AM   HDL 95 12/22/2018 04:48 PM   CHOLHDL 2.4 03/09/2018 11:42 AM   LDLCALC 82 12/22/2018  04:48 PM    Wt Readings from Last 3 Encounters:  06/08/18 (!) 357 lb (161.9 kg)  03/09/18 (!) 353 lb (160.1 kg)  01/02/18 (!) 355 lb (161 kg)     Exam:    Vital Signs: Vital signs may also be detailed in the HPI LMP  (LMP Unknown)   Wt Readings from Last 3 Encounters:  06/08/18 (!) 357 lb (161.9 kg)  03/09/18 (!) 353 lb (160.1 kg)  01/02/18 (!) 355 lb (161 kg)   Temp Readings from Last 3 Encounters:  12/22/18 98.5 F (36.9 C) (Oral)  08/31/18 97.7 F (36.5 C) (Oral)  06/08/18 97.9 F (36.6 C) (Oral)   BP Readings from Last 3 Encounters:  12/22/18 (!) 144/82  11/11/18 (!) 156/82  08/31/18 (!) 143/80   Pulse Readings from Last 3 Encounters:  12/22/18 60  08/31/18 (!) 57  06/08/18 66     Well nourished, well developed female in no acute distress. Constitutional:  oriented to person, place, and time. No distress.    ASSESSMENT & PLAN:    Problem List Items Addressed This Visit      Cardiology Problems   Essential hypertension, benign   Hyperlipidemia   Aortic atherosclerosis (HCC) - Primary     Lymphedema: Having more leg pain, Thinks it is because she is no longer doing massage on her legs, worsening lymphedema Because of is not getting around as much Legs feel restless at nighttime Lymphedema compression pumps do not work,  sleeves were not fitted well.  She still has the pump -We have recommend referral to vein and vascular, new prescription for lymphedema sleeves, perhaps could work with her previous pump  Hypertension Mildly elevated, she has a good regiment splitting the pills in half taking them twice a day which seems to work for her We will avoid hypotension  Aortic atherosclerosis Tolerating Lipitor 10 mg daily, will not push it more given some leg discomfort Number should drop with Nutrisystem   COVID-19 Education: The signs and symptoms of COVID-19 were discussed with the patient and how to seek care for testing (follow up with PCP or arrange E-visit).  The importance of social distancing was discussed today.  Patient Risk:   After full review of this patients clinical status, I feel that they are at least moderate risk at this time.  Time:   Today, I have spent 25 minutes with the patient with telehealth technology discussing the cardiac and medical problems/diagnoses detailed above   Additional 10 min spent reviewing the chart prior to patient visit today   Medication Adjustments/Labs and Tests Ordered: Current medicines are reviewed at length with the patient today.  Concerns regarding medicines are outlined above.   Tests Ordered: No tests ordered   Medication Changes: No changes made   Disposition: Follow-up in 12 months   Signed, Julien Nordmannimothy Gollan, MD  [facility] [place] Office [address], [place], [place] [address]

## 2019-02-17 ENCOUNTER — Telehealth (INDEPENDENT_AMBULATORY_CARE_PROVIDER_SITE_OTHER): Payer: BC Managed Care – PPO | Admitting: Cardiovascular Disease

## 2019-02-17 DIAGNOSIS — I700 Atherosclerosis of aorta: Secondary | ICD-10-CM | POA: Diagnosis not present

## 2019-02-17 DIAGNOSIS — I890 Lymphedema, not elsewhere classified: Secondary | ICD-10-CM

## 2019-02-17 DIAGNOSIS — E782 Mixed hyperlipidemia: Secondary | ICD-10-CM | POA: Diagnosis not present

## 2019-02-17 DIAGNOSIS — J4541 Moderate persistent asthma with (acute) exacerbation: Secondary | ICD-10-CM

## 2019-02-17 DIAGNOSIS — I10 Essential (primary) hypertension: Secondary | ICD-10-CM

## 2019-02-17 NOTE — Patient Instructions (Addendum)
Medications:  No changes  If you need a refill on your cardiac medications before your next appointment, please call your pharmacy.    Lab work: No new labs needed   If you have labs (blood work) drawn today and your tests are completely normal, you will receive your results only by: Marland Kitchen MyChart Message (if you have MyChart) OR . A paper copy in the mail If you have any lab test that is abnormal or we need to change your treatment, we will call you to review the results.   Testing/Procedures: - You have been referred to : [facility] (Drs. Dew/ Ronalee Belts) Their office should contact you to schedule  Follow-Up: At [facility], you and your health needs are our priority.  As part of our continuing mission to provide you with exceptional heart care, we have created designated Provider Care Teams.  These Care Teams include your primary Cardiologist (physician) and Advanced Practice Providers (APPs -  Physician Assistants and Nurse Practitioners) who all work together to provide you with the care you need, when you need it.  . You will need a follow up appointment in 12 months (late September/ early October)  .   Please call our office 2 months in advance to schedule this appointment.  (Call in early July to try to schedule)   . Providers on your designated Care Team:   . Murray Hodgkins, NP . Christell Faith, PA-C . Marrianne Mood, PA-C  Any Other Special Instructions Will Be Listed Below (If Applicable).  For educational health videos Log in to : [email or web address] Or : [email or web address], password : [email or web address]

## 2019-02-26 ENCOUNTER — Telehealth: Payer: Self-pay

## 2019-02-26 ENCOUNTER — Ambulatory Visit: Payer: Self-pay | Admitting: Licensed Clinical Social Worker

## 2019-02-26 ENCOUNTER — Ambulatory Visit: Payer: BC Managed Care – PPO | Admitting: Nurse Practitioner

## 2019-02-26 NOTE — Chronic Care Management (AMB) (Signed)
  Care Management   Follow Up Note   02/26/2019 Name: [name] MRN: [id number] DOB: 1940-05-22  Referred by: Venita Lick, NP Reason for referral : Care Coordination   [name] is a 78 y.o. year old female who is a primary care patient of Cannady, Barbaraann Faster, NP. The care management team was consulted for assistance with care management and care coordination needs.    Review of patient status, including review of consultants reports, relevant laboratory and other test results, and collaboration with appropriate care team members and the patient's provider was performed as part of comprehensive patient evaluation and provision of chronic care management services.    LCSW completed CCM outreach attempt today but was unable to reach patient successfully. A HIPPA compliant voice message was left encouraging patient to return call once available. LCSW rescheduled CCM SW appointment as well.  A HIPPA compliant phone message was left for the patient providing contact information and requesting a return call.   Eula Fried, BSW, MSW, [facility] Care [facility] [email or web address] Phone: [phone]

## 2019-03-02 ENCOUNTER — Ambulatory Visit: Payer: Self-pay | Admitting: Pharmacist

## 2019-03-02 ENCOUNTER — Telehealth: Payer: Medicare Other

## 2019-03-02 NOTE — Chronic Care Management (AMB) (Signed)
  Chronic Care Management   Note  03/02/2019 Name: GLENNYS SCHORSCH MRN: [id number] DOB: [birth year]  Lucita Lora Strehlow is a 78 y.o. year old female who is a primary care patient of Cannady, Barbaraann Faster, NP. The CCM team was consulted for assistance with chronic disease management and care coordination needs.    Attempted to contact patient for medication management follow up; left HIPAA compliant message for patient to return my call.   This is the third consecutive unsuccessful outreach attempt from the CCM team. Patient has follow up with PCP next week; will collaborate and see which members of the CCM team, if any, PCP and patient believe would provide benefit by continuing to outreach.   Catie Darnelle Maffucci, PharmD Clinical Pharmacist [place] [phone]

## 2019-03-03 ENCOUNTER — Other Ambulatory Visit: Payer: Self-pay

## 2019-03-03 MED ORDER — ATORVASTATIN CALCIUM 10 MG PO TABS: ORAL_TABLET | ORAL | 3 refills | Status: DC

## 2019-03-03 NOTE — Telephone Encounter (Signed)
Patient last seen 12/22/18 and has 2 upcoming appointments scheduled

## 2019-03-12 ENCOUNTER — Encounter: Payer: Self-pay | Admitting: Nurse Practitioner

## 2019-03-12 ENCOUNTER — Ambulatory Visit (INDEPENDENT_AMBULATORY_CARE_PROVIDER_SITE_OTHER): Payer: BC Managed Care – PPO | Admitting: Nurse Practitioner

## 2019-03-12 VITALS — BP 125/67 | HR 64 | Temp 97.2°F

## 2019-03-12 DIAGNOSIS — E782 Mixed hyperlipidemia: Secondary | ICD-10-CM | POA: Diagnosis not present

## 2019-03-12 DIAGNOSIS — G4733 Obstructive sleep apnea (adult) (pediatric): Secondary | ICD-10-CM | POA: Insufficient documentation

## 2019-03-12 DIAGNOSIS — F5101 Primary insomnia: Secondary | ICD-10-CM | POA: Diagnosis not present

## 2019-03-12 DIAGNOSIS — I10 Essential (primary) hypertension: Secondary | ICD-10-CM | POA: Diagnosis not present

## 2019-03-12 DIAGNOSIS — R5383 Other fatigue: Secondary | ICD-10-CM | POA: Diagnosis not present

## 2019-03-12 MED ORDER — IRBESARTAN 300 MG PO TABS: 300.0000 mg | ORAL_TABLET | Freq: Every day | ORAL | 3 refills | Status: DC

## 2019-03-12 MED ORDER — TRAZODONE HCL 50 MG PO TABS: 50.0000 mg | ORAL_TABLET | Freq: Every evening | ORAL | 2 refills | Status: DC | PRN

## 2019-03-12 NOTE — Progress Notes (Signed)
BP 125/67   Pulse 64   Temp (!) 97.2 F (36.2 C) (Oral)   LMP  (LMP Unknown)   SpO2 97%    Subjective:    Patient ID: [name], female    DOB: [birth year], 78 y.o.   MRN: [id number]  HPI: [name] is a 78 y.o. female  Chief Complaint  Patient presents with  . Hyperlipidemia  . Hypertension  . Insomnia    pt states that the trazodone is not working  . Fatigue    . This visit was completed via Doximity due to the restrictions of the COVID-19 pandemic. All issues as above were discussed and addressed. Physical exam was done as above through visual confirmation on Doximity. If it was felt that the patient should be evaluated in the office, they were directed there. The patient verbally consented to this visit. . Location of the patient: home . Location of the provider: work . Those involved with this call:  . Provider: Aura Dials, DNP . CMA: Elton Sin, CMA . Front Desk/Registration: Harriet Pho  . Time spent on call: 15 minutes with patient face to face via video conference. More than 50% of this time was spent in counseling and coordination of care. 10 minutes total spent in review of patient's record and preparation of their chart.  . I verified patient identity using two factors (patient name and date of birth). Patient consents verbally to being seen via telemedicine visit today.    HYPERTENSION / HYPERLIPIDEMIA Continues on Furosemide, ASA, Avapro, Bystolic, and Atorvastatin.  Is using Nutrisystem and feels she has lost weight, tops are getting looser.  Saw Dr. Mariah Milling on 02/17/2019 and he has recommended she go to vascular for follow-up on her lymphedema, as she has been unable to attend massage as much due to Covid.  She goes to see them November 2nd.   Satisfied with current treatment? yes Duration of hypertension: chronic BP monitoring frequency: daily BP range: 125-150/67-79  BP medication side effects: no Duration of hyperlipidemia: chronic Cholesterol  medication side effects: no Cholesterol supplements: none Medication compliance: good compliance Aspirin: no Recent stressors: no Recurrent headaches: no Visual changes: no Palpitations: no Dyspnea: no Chest pain: no Lower extremity edema: at baseline with lymphedema Dizzy/lightheaded: no   INSOMNIA: Started on Trazodone in June 2020, although feels it is not always working well.  Taking 1/2 tablet, 25 MG.  Does use a CPAP, has used it for over a year.  Uses it 100% of the time.  Duration: chronic Satisfied with sleep quality: no Difficulty falling asleep: yes Difficulty staying asleep: no Waking a few hours after sleep onset: sometimes Early morning awakenings: no Daytime hypersomnolence: no Wakes feeling refreshed: yes Good sleep hygiene: yes Apnea: uses CPAP Snoring: no Depressed/anxious mood: no Recent stress: no Restless legs/nocturnal leg cramps: occasional cramps Chronic pain/arthritis: no History of sleep study: yes Treatments attempted: melatonin and benadryl  FATIGUE Feels she has been more fatigued since retiring, is concerned it is her thyroid.  Also reports sometimes after eating meals, she feels tired.  Denies any SOB, CP, increased edema, orthopnea, fever, blood in stool, or abdominal pain.  Does endorse she has not always been sleeping as well and has been a "bit more down" since retiring and not being able to socialize as much. Duration:  weeks Severity: mild  Onset: gradual Context when symptoms started:  unknown Symptoms improve with rest: sometimes  Depressive symptoms: yes Stress/anxiety: no Insomnia: yes hard to fall  asleep Snoring: yes Observed apnea by bed partner: wears CPAP Daytime hypersomnolence:no Wakes feeling refreshed: sometimes History of sleep study: yes Dyspnea on exertion:  no Orthopnea/PND: no Chest pain: no Chronic cough: no Lower extremity edema: yes, baseline Arthralgias:no Myalgias: no Weakness: no Rash: no   Relevant  past medical, surgical, family and social history reviewed and updated as indicated. Interim medical history since our last visit reviewed. Allergies and medications reviewed and updated.  Review of Systems  Constitutional: Positive for fatigue. Negative for activity change, appetite change, diaphoresis and fever.  Respiratory: Negative for cough, chest tightness and shortness of breath.   Cardiovascular: Negative for chest pain, palpitations and leg swelling.  Gastrointestinal: Negative for abdominal distention, abdominal pain, constipation, diarrhea, nausea and vomiting.  Endocrine: Negative for cold intolerance, heat intolerance, polydipsia, polyphagia and polyuria.  Neurological: Negative for dizziness, syncope, weakness, light-headedness, numbness and headaches.  Psychiatric/Behavioral: Positive for decreased concentration and sleep disturbance. Negative for self-injury and suicidal ideas. The patient is not nervous/anxious.     Per HPI unless specifically indicated above     Objective:    BP 125/67   Pulse 64   Temp (!) 97.2 F (36.2 C) (Oral)   LMP  (LMP Unknown)   SpO2 97%   Wt Readings from Last 3 Encounters:  06/08/18 (!) 357 lb (161.9 kg)  03/09/18 (!) 353 lb (160.1 kg)  01/02/18 (!) 355 lb (161 kg)    Physical Exam Vitals signs and nursing note reviewed.  Constitutional:      General: She is awake. She is not in acute distress.    Appearance: She is well-developed. She is not ill-appearing.  HENT:     Head: Normocephalic.     Right Ear: Hearing normal.     Left Ear: Hearing normal.  Eyes:     General: Lids are normal.        Right eye: No discharge.        Left eye: No discharge.     Conjunctiva/sclera: Conjunctivae normal.  Neck:     Musculoskeletal: Normal range of motion.  Pulmonary:     Effort: Pulmonary effort is normal. No accessory muscle usage or respiratory distress.  Neurological:     Mental Status: She is alert and oriented to person, place, and  time.  Psychiatric:        Attention and Perception: Attention normal.        Mood and Affect: Mood normal.        Behavior: Behavior normal. Behavior is cooperative.        Thought Content: Thought content normal.        Judgment: Judgment normal.     Results for orders placed or performed in visit on 12/22/18  Lipid Panel w/o Chol/HDL Ratio  Result Value Ref Range   Cholesterol, Total 195 100 - 199 mg/dL   Triglycerides 90 0 - 149 mg/dL   HDL 95 >39 mg/dL   VLDL Cholesterol Cal 18 5 - 40 mg/dL   LDL Calculated 82 0 - 99 mg/dL  Vitamin B12  Result Value Ref Range   Vitamin B-12 642 232 - 1,245 pg/mL  Comprehensive metabolic panel  Result Value Ref Range   Glucose 87 65 - 99 mg/dL   BUN 22 8 - 27 mg/dL   Creatinine, Ser 0.91 0.57 - 1.00 mg/dL   GFR calc non Af Amer 61 >59 mL/min/1.73   GFR calc Af Amer 70 >59 mL/min/1.73   BUN/Creatinine Ratio 24 12 - 28  Sodium 137 134 - 144 mmol/L   Potassium 4.9 3.5 - 5.2 mmol/L   Chloride 102 96 - 106 mmol/L   CO2 19 (L) 20 - 29 mmol/L   Calcium 10.6 (H) 8.7 - 10.3 mg/dL   Total Protein 6.9 6.0 - 8.5 g/dL   Albumin 4.6 3.7 - 4.7 g/dL   Globulin, Total 2.3 1.5 - 4.5 g/dL   Albumin/Globulin Ratio 2.0 1.2 - 2.2   Bilirubin Total 0.6 0.0 - 1.2 mg/dL   Alkaline Phosphatase 92 39 - 117 IU/L   AST 13 0 - 40 IU/L   ALT 13 0 - 32 IU/L      Assessment & Plan:   Problem List Items Addressed This Visit      Cardiovascular and Mediastinum   Essential hypertension, benign - Primary    Chronic, ongoing with majority at home BP readings at goal for age. Continue current medication regimen and adjust as needed + collaboration with cardiology.  CMP outpatient.      Relevant Medications   irbesartan (AVAPRO) 300 MG tablet     Respiratory   OSA (obstructive sleep apnea)    Continue use of CPAP.        Other   Hyperlipidemia    Chronic, ongoing.  Continue current medication regimen and adjust as needed.        Relevant Medications    irbesartan (AVAPRO) 300 MG tablet   Fatigue    Denies any cardiac symptoms or SOB/orthopnea.  Patient is morbidly obese and recently retired.  Suspect some of fatigue related to mood and poor sleep pattern.  Will obtain outpatient labs A1C, CMP, anemia panel, CBC, and thyroid panel.        Relevant Orders   Bayer DCA Hb A1c Waived   Comprehensive metabolic panel   Anemia panel   CBC with Differential/Platelet   Thyroid Panel With TSH   Insomnia    Chronic, with underlying OSA and depression.  Will increase Trazodone to 50 MG QHS as needed, which may benefit both mood and sleep pattern.  Continue use of CPAP.          Follow up plan: Return in about 3 months (around 06/12/2019) for HTN/HLD, Mood, Insomnia, .

## 2019-03-12 NOTE — Assessment & Plan Note (Signed)
Chronic, with underlying OSA and depression.  Will increase Trazodone to 50 MG QHS as needed, which may benefit both mood and sleep pattern.  Continue use of CPAP.

## 2019-03-12 NOTE — Assessment & Plan Note (Signed)
Chronic, ongoing.  Continue current medication regimen and adjust as needed.   

## 2019-03-12 NOTE — Assessment & Plan Note (Signed)
Denies any cardiac symptoms or SOB/orthopnea.  Patient is morbidly obese and recently retired.  Suspect some of fatigue related to mood and poor sleep pattern.  Will obtain outpatient labs A1C, CMP, anemia panel, CBC, and thyroid panel.

## 2019-03-12 NOTE — Assessment & Plan Note (Signed)
Chronic, ongoing with majority at home BP readings at goal for age. Continue current medication regimen and adjust as needed + collaboration with cardiology.  CMP outpatient.

## 2019-03-12 NOTE — Assessment & Plan Note (Signed)
Continue use of CPAP. 

## 2019-03-12 NOTE — Patient Instructions (Signed)

## 2019-03-17 ENCOUNTER — Telehealth: Payer: Self-pay

## 2019-03-21 DIAGNOSIS — I872 Venous insufficiency (chronic) (peripheral): Secondary | ICD-10-CM | POA: Insufficient documentation

## 2019-03-21 NOTE — Progress Notes (Deleted)
MRN : [id number]  [name] is a 78 y.o. (02-12-1941) female who presents with chief complaint of No chief complaint on file. Marland Kitchen  History of Present Illness:   Patient is seen for evaluation of leg pain and leg swelling. The patient first noticed the swelling remotely. The swelling is associated with pain and discoloration. The pain and swelling worsens with prolonged dependency and improves with elevation. The pain is unrelated to activity.  The patient notes that in the morning the legs are significantly improved but they steadily worsened throughout the course of the day. The patient also notes a steady worsening of the discoloration in the ankle and shin area.   The patient denies claudication symptoms.  The patient denies symptoms consistent with rest pain.  The patient denies and extensive history of DJD and LS spine disease.  The patient has no had any past angiography, interventions or vascular surgery.  Elevation makes the leg symptoms better, dependency makes them much worse. There is no history of ulcerations. The patient denies any recent changes in medications.  The patient has not been wearing graduated compression.  The patient denies a history of DVT or PE. There is no prior history of phlebitis. There is no history of primary lymphedema.  No history of malignancies. No history of trauma or groin or pelvic surgery. There is no history of radiation treatment to the groin or pelvis  The patient denies amaurosis fugax or recent TIA symptoms. There are no recent neurological changes noted. The patient denies recent episodes of angina or shortness of breath  No outpatient medications have been marked as taking for the 03/22/19 encounter (Appointment) with Gilda Crease, Latina Craver, MD.    Past Medical History:  Diagnosis Date  . Allergy   . Arthritis   . Asthma   . Chronic fatigue syndrome   . Fractured fibula   . GERD (gastroesophageal reflux disease)   . Hypertension   .  Lymphedema 2004  . Neuromuscular disorder (HCC)   . Pneumonia     Past Surgical History:  Procedure Laterality Date  . ABDOMINAL HYSTERECTOMY  1974  . BIOPSY THYROID    . CHOLECYSTECTOMY  2005    Social History Social History   Tobacco Use  . Smoking status: Never Smoker  . Smokeless tobacco: Never Used  Substance Use Topics  . Alcohol use: No    Alcohol/week: 0.0 standard drinks  . Drug use: No    Family History Family History  Problem Relation Age of Onset  . Heart disease Mother   . Hyperlipidemia Mother   . Diabetes Father   . Alcoholism Father   . Cancer Brother   . Hypertension Son   . Stroke Maternal Grandmother   No family history of bleeding/clotting disorders, porphyria or autoimmune disease   Allergies  Allergen Reactions  . Latex Rash  . Benicar [Olmesartan] Swelling  . Ciprofloxacin Other (See Comments)  . Diovan [Valsartan] Other (See Comments)    fatigue  . Flagyl [Metronidazole]   . Lotensin [Benazepril Hcl] Cough  . Penicillins   . Sulfa Antibiotics      REVIEW OF SYSTEMS (Negative unless checked)  Constitutional: [] Weight loss  [] Fever  [] Chills Cardiac: [] Chest pain   [] Chest pressure   [] Palpitations   [] Shortness of breath when laying flat   [] Shortness of breath with exertion. Vascular:  [] Pain in legs with walking   [] Pain in legs at rest  [] History of DVT   [] Phlebitis   [] Swelling  in legs   [] Varicose veins   [] Non-healing ulcers Pulmonary:   [] Uses home oxygen   [] Productive cough   [] Hemoptysis   [] Wheeze  [] COPD   [] Asthma Neurologic:  [] Dizziness   [] Seizures   [] History of stroke   [] History of TIA  [] Aphasia   [] Vissual changes   [] Weakness or numbness in arm   [] Weakness or numbness in leg Musculoskeletal:   [] Joint swelling   [] Joint pain   [] Low back pain Hematologic:  [] Easy bruising  [] Easy bleeding   [] Hypercoagulable state   [] Anemic Gastrointestinal:  [] Diarrhea   [] Vomiting  [] Gastroesophageal reflux/heartburn    [] Difficulty swallowing. Genitourinary:  [] Chronic kidney disease   [] Difficult urination  [] Frequent urination   [] Blood in urine Skin:  [] Rashes   [] Ulcers  Psychological:  [] History of anxiety   []  History of major depression.  Physical Examination  There were no vitals filed for this visit. There is no height or weight on file to calculate BMI. Gen: WD/WN, NAD Head: [place]/AT, No temporalis wasting.  Ear/Nose/Throat: Hearing grossly intact, nares w/o erythema or drainage, poor dentition Eyes: PER, EOMI, sclera nonicteric.  Neck: Supple, no masses.  No bruit or JVD.  Pulmonary:  Good air movement, clear to auscultation bilaterally, no use of accessory muscles.  Cardiac: RRR, normal S1, S2, no Murmurs. Vascular: *** Vessel Right Left  Radial Palpable Palpable  Ulnar Palpable Palpable  Brachial Palpable Palpable  Carotid Palpable Palpable  Femoral Palpable Palpable  Popliteal Palpable Palpable  PT Palpable Palpable  DP Palpable Palpable   Gastrointestinal: soft, non-distended. No guarding/no peritoneal signs.  Musculoskeletal: M/S 5/5 throughout.  No deformity or atrophy.  Neurologic: CN 2-12 intact. Pain and light touch intact in extremities.  Symmetrical.  Speech is fluent. Motor exam as listed above. Psychiatric: Judgment intact, Mood & affect appropriate for pt's clinical situation. Dermatologic: No rashes or ulcers noted.  No changes consistent with cellulitis. Lymph : No Cervical lymphadenopathy, no lichenification or skin changes of chronic lymphedema.  CBC Lab Results  Component Value Date   WBC 4.1 06/08/2018   HGB 15.0 06/08/2018   HCT 43.9 06/08/2018   MCV 90 06/08/2018   PLT 229 06/08/2018    BMET    Component Value Date/Time   NA 137 12/22/2018 1648   K 4.9 12/22/2018 1648   CL 102 12/22/2018 1648   CO2 19 (L) 12/22/2018 1648   GLUCOSE 87 12/22/2018 1648   BUN 22 12/22/2018 1648   CREATININE 0.91 12/22/2018 1648   CALCIUM 10.6 (H) 12/22/2018 1648    GFRNONAA 61 12/22/2018 1648   GFRAA 70 12/22/2018 1648   CrCl cannot be calculated (Patient's most recent lab result is older than the maximum 21 days allowed.).  COAG No results found for: INR, PROTIME  Radiology No results found.  Outside Studies/Documentation *** pages of outside documents were reviewed.  They showed ***.  Assessment/Plan There are no diagnoses linked to this encounter.   Hortencia Pilar, MD  03/21/2019 12:27 PM

## 2019-03-22 ENCOUNTER — Encounter (INDEPENDENT_AMBULATORY_CARE_PROVIDER_SITE_OTHER): Payer: BC Managed Care – PPO | Admitting: Vascular Surgery

## 2019-03-22 ENCOUNTER — Telehealth: Payer: BC Managed Care – PPO | Admitting: Family Medicine

## 2019-03-22 ENCOUNTER — Telehealth: Payer: Self-pay | Admitting: Nurse Practitioner

## 2019-03-22 NOTE — Telephone Encounter (Signed)
Called pt she states that she does want to go to [facility], being that she doesn't have a way there and for someone to stay with her and wait due to her children having to work. She states that she scheduled an appointment for next Wednesday with Dr Lisbeth Renshaw. Stressed importance of her going to be seen today at [facility] so that they can do imaging and give her something for pain. She declined, wanted to know how much tylenol she can take and how often. Per [name] advised to her 1000 MG three times a day, no more. Pt verbalized understanding.

## 2019-03-22 NOTE — Telephone Encounter (Signed)
Noted, continue to recommend [facility] visit if worsening discomfort today.

## 2019-03-22 NOTE — Telephone Encounter (Signed)
Pt called in pec agent stated that pt fell out of the car while going to vote. Agent stated that pt said she could barley walk. Spoke with Apolonio Schneiders about it and she stated that she would advise pt to go ahead and go to [facility]. Pt states that she doesn't want to go to just anyone. She states that she would like for [name] to call her in a pain medication. Please advise.

## 2019-03-22 NOTE — Telephone Encounter (Signed)
Can not call in pain medication without assessment.  Left general message with patient.  Please attempt to contact her later and alert her to my recommendation for [facility] as well, that way she can obtain same day imaging and determine need for medication.  They have a walk-in clinic available for acute injury.

## 2019-03-24 ENCOUNTER — Ambulatory Visit: Payer: BC Managed Care – PPO | Admitting: Nurse Practitioner

## 2019-03-29 ENCOUNTER — Ambulatory Visit: Payer: BC Managed Care – PPO | Admitting: Licensed Clinical Social Worker

## 2019-03-29 DIAGNOSIS — F3340 Major depressive disorder, recurrent, in remission, unspecified: Secondary | ICD-10-CM

## 2019-03-29 NOTE — Chronic Care Management (AMB) (Signed)
Chronic Care Management    Clinical Social Work Follow Up Note  03/29/2019 Name: [name] MRN: [id number] DOB: 12-18-1940  [name] is a 78 y.o. year old female who is a primary care patient of Cannady, Dorie Rank, NP. The CCM team was consulted for assistance with Grief Counseling.   Review of patient status, including review of consultants reports, other relevant assessments, and collaboration with appropriate care team members and the patient's provider was performed as part of comprehensive patient evaluation and provision of chronic care management services.    SDOH (Social Determinants of Health) screening performed today: Depression  . See Care Plan for related entries.   Advanced Directives Status: <no information> See Care Plan for related entries.   Outpatient Encounter Medications as of 03/29/2019  Medication Sig Note  . albuterol (PROVENTIL) (5 MG/ML) 0.5% nebulizer solution Take 0.5 mLs (2.5 mg total) by nebulization every 6 (six) hours as needed for wheezing or shortness of breath.   . Apple Cider Vinegar 500 MG TABS Take 500 mg by mouth 2 (two) times a day.    Marland Kitchen aspirin 81 MG tablet Take 81 mg by mouth daily.   Marland Kitchen atorvastatin (LIPITOR) 10 MG tablet TAKE 1 TABLET(10 MG) BY MOUTH DAILY   . buPROPion (WELLBUTRIN SR) 150 MG 12 hr tablet Take 150 mg by mouth 2 (two) times daily. 11/24/2018: Only taking QAM  . Cholecalciferol (VITAMIN D-3 PO) Take 2,000 Units by mouth daily.    . furosemide (LASIX) 40 MG tablet Take 1 tablet (40 mg total) by mouth daily as needed.   . irbesartan (AVAPRO) 300 MG tablet Take 1 tablet (300 mg total) by mouth daily.   . magnesium oxide (MAG-OX) 400 MG tablet Take 400 mg by mouth 2 (two) times a day.    . nebivolol (BYSTOLIC) 5 MG tablet Take 5 mg by mouth daily. 11/24/2018: Taking 1/2 tab QHS, 1/2 tab QPM  . omeprazole (PRILOSEC) 20 MG capsule TAKE 1 CAPSULE(20 MG) BY MOUTH DAILY   . Probiotic Product (PROBIOTIC PO) Take 1 tablet by mouth daily.   .  Tiotropium Bromide Monohydrate (SPIRIVA RESPIMAT) 1.25 MCG/ACT AERS Inhale 2 puffs into the lungs daily.   . traZODone (DESYREL) 50 MG tablet Take 1 tablet (50 mg total) by mouth at bedtime as needed for sleep.   Marland Kitchen triamcinolone cream (KENALOG) 0.1 % APPLY TOPICALLY TO THE AFFECTED AREA TWICE DAILY   . vitamin E 400 UNIT capsule Take 400 Units by mouth daily.   Monte Fantasia INHUB 250-50 MCG/DOSE AEPB INHALE 1 PUFF INTO THE LUNGS TWICE DAILY    No facility-administered encounter medications on file as of 03/29/2019.      Goals Addressed    . SW-"I need additional support right now." (pt-stated)       Current Barriers:  . Financial constraints . ADL IADL limitations . Mental Health Symptoms . Social Isolation . Lacks knowledge of community resource: grief support resources within the area  Clinical Social Work Clinical Goal(s):  Marland Kitchen Over the next 120 days, client will work with SW to address concerns related to lack of self-care and ongoing grief over spouse  Interventions: . Patient interviewed and appropriate assessments performed . Provided mental health counseling regarding loss of spouse. Patient denies wanting grief counseling referral at this time. States that she has a large support network and has recently retired. Patient reports spending a lot of time crafting and painting to help cope.  Marland Kitchen LCSW provided emotional support and  reflective listening throughout entire session.  . Provided patient with information about [facility] in case she changes her mind. . Patient reports that her daughter just got her a nice lift chair that massages.  . Patient reports that she had a fall recently and has an orthopedic appointment set for 03/31/2019.  Marland Kitchen Discussed plans with patient for ongoing care management follow up and provided patient with direct contact information for care management team . Advised patient to to contact CCM LCSW if any urgent concerns arise.  . Assisted  patient/caregiver with obtaining information about health plan benefits  Patient Self Care Activities:  . Attends all scheduled provider appointments . Calls provider office for new concerns or questions  Please see past updates related to this goal by clicking on the "Past Updates" button in the selected goal      Follow Up Plan: SW will follow up with patient by phone over the next quarter  Eula Fried, Helen, MSW, [email or web address] Phone: [phone]

## 2019-03-31 DIAGNOSIS — Z6841 Body Mass Index (BMI) 40.0 and over, adult: Secondary | ICD-10-CM | POA: Insufficient documentation

## 2019-03-31 DIAGNOSIS — M170 Bilateral primary osteoarthritis of knee: Secondary | ICD-10-CM | POA: Diagnosis not present

## 2019-03-31 DIAGNOSIS — M25561 Pain in right knee: Secondary | ICD-10-CM | POA: Diagnosis not present

## 2019-04-09 ENCOUNTER — Other Ambulatory Visit: Payer: Self-pay

## 2019-04-09 MED ORDER — OMEPRAZOLE 20 MG PO CPDR: DELAYED_RELEASE_CAPSULE | ORAL | 2 refills | Status: DC

## 2019-04-12 ENCOUNTER — Ambulatory Visit (INDEPENDENT_AMBULATORY_CARE_PROVIDER_SITE_OTHER): Payer: BC Managed Care – PPO

## 2019-04-12 VITALS — HR 74

## 2019-04-12 DIAGNOSIS — Z0000 Encounter for general adult medical examination without abnormal findings: Secondary | ICD-10-CM

## 2019-04-12 NOTE — Patient Instructions (Signed)
[name] , Thank you for taking time to come for your Medicare Wellness Visit. I appreciate your ongoing commitment to your health goals. Please review the following plan we discussed and let me know if I can assist you in the future.   Screening recommendations/referrals: Colonoscopy: no longer required Mammogram: no longer required Bone Density: no longer required  Recommended yearly ophthalmology/optometry visit for glaucoma screening and checkup Recommended yearly dental visit for hygiene and checkup  Vaccinations: Influenza vaccine: declined  Pneumococcal vaccine: up to date, eligible for booster  Tdap vaccine: up to date  Shingles vaccine: shingrix eligible    Advanced directives: please pick up a copy of this information next time you are in the office  Conditions/risks identified: fall preventions discussed below, please call if you need anything!  Next appointment: Follow up in one year for your annual wellness    Preventive Care 65 Years and Older, Female Preventive care refers to lifestyle choices and visits with your health care provider that can promote health and wellness. What does preventive care include?  A yearly physical exam. This is also called an annual well check.  Dental exams once or twice a year.  Routine eye exams. Ask your health care provider how often you should have your eyes checked.  Personal lifestyle choices, including:  Daily care of your teeth and gums.  Regular physical activity.  Eating a healthy diet.  Avoiding tobacco and drug use.  Limiting alcohol use.  Practicing safe sex.  Taking low-dose aspirin every day.  Taking vitamin and mineral supplements as recommended by your health care provider. What happens during an annual well check? The services and screenings done by your health care provider during your annual well check will depend on your age, overall health, lifestyle risk factors, and family history of disease.  Counseling  Your health care provider may ask you questions about your:  Alcohol use.  Tobacco use.  Drug use.  Emotional well-being.  Home and relationship well-being.  Sexual activity.  Eating habits.  History of falls.  Memory and ability to understand (cognition).  Work and work Statistician.  Reproductive health. Screening  You may have the following tests or measurements:  Height, weight, and BMI.  Blood pressure.  Lipid and cholesterol levels. These may be checked every 5 years, or more frequently if you are over 78 years old.  Skin check.  Lung cancer screening. You may have this screening every year starting at age 64 if you have a 30-pack-year history of smoking and currently smoke or have quit within the past 15 years.  Fecal occult blood test (FOBT) of the stool. You may have this test every year starting at age 78.  Flexible sigmoidoscopy or colonoscopy. You may have a sigmoidoscopy every 5 years or a colonoscopy every 10 years starting at age 78.  Hepatitis C blood test.  Hepatitis B blood test.  Sexually transmitted disease (STD) testing.  Diabetes screening. This is done by checking your blood sugar (glucose) after you have not eaten for a while (fasting). You may have this done every 1-3 years.  Bone density scan. This is done to screen for osteoporosis. You may have this done starting at age 14.  Mammogram. This may be done every 1-2 years. Talk to your health care provider about how often you should have regular mammograms. Talk with your health care provider about your test results, treatment options, and if necessary, the need for more tests. Vaccines  Your health care  provider may recommend certain vaccines, such as:  Influenza vaccine. This is recommended every year.  Tetanus, diphtheria, and acellular pertussis (Tdap, Td) vaccine. You may need a Td booster every 10 years.  Zoster vaccine. You may need this after age 14.   Pneumococcal 13-valent conjugate (PCV13) vaccine. One dose is recommended after age 78.  Pneumococcal polysaccharide (PPSV23) vaccine. One dose is recommended after age 78. Talk to your health care provider about which screenings and vaccines you need and how often you need them. This information is not intended to replace advice given to you by your health care provider. Make sure you discuss any questions you have with your health care provider. Document Released: 06/02/2015 Document Revised: 01/24/2016 Document Reviewed: 03/07/2015 Elsevier Interactive Patient Education  2017 [place] Prevention in the Home Falls can cause injuries. They can happen to people of all ages. There are many things you can do to make your home safe and to help prevent falls. What can I do on the outside of my home?  Regularly fix the edges of walkways and driveways and fix any cracks.  Remove anything that might make you trip as you walk through a door, such as a raised step or threshold.  Trim any bushes or trees on the path to your home.  Use bright outdoor lighting.  Clear any walking paths of anything that might make someone trip, such as rocks or tools.  Regularly check to see if handrails are loose or broken. Make sure that both sides of any steps have handrails.  Any raised decks and porches should have guardrails on the edges.  Have any leaves, snow, or ice cleared regularly.  Use sand or salt on walking paths during winter.  Clean up any spills in your garage right away. This includes oil or grease spills. What can I do in the bathroom?  Use night lights.  Install grab bars by the toilet and in the tub and shower. Do not use towel bars as grab bars.  Use non-skid mats or decals in the tub or shower.  If you need to sit down in the shower, use a plastic, non-slip stool.  Keep the floor dry. Clean up any water that spills on the floor as soon as it happens.  Remove soap  buildup in the tub or shower regularly.  Attach bath mats securely with double-sided non-slip rug tape.  Do not have throw rugs and other things on the floor that can make you trip. What can I do in the bedroom?  Use night lights.  Make sure that you have a light by your bed that is easy to reach.  Do not use any sheets or blankets that are too big for your bed. They should not hang down onto the floor.  Have a firm chair that has side arms. You can use this for support while you get dressed.  Do not have throw rugs and other things on the floor that can make you trip. What can I do in the kitchen?  Clean up any spills right away.  Avoid walking on wet floors.  Keep items that you use a lot in easy-to-reach places.  If you need to reach something above you, use a strong step stool that has a grab bar.  Keep electrical cords out of the way.  Do not use floor polish or wax that makes floors slippery. If you must use wax, use non-skid floor wax.  Do not have throw  rugs and other things on the floor that can make you trip. What can I do with my stairs?  Do not leave any items on the stairs.  Make sure that there are handrails on both sides of the stairs and use them. Fix handrails that are broken or loose. Make sure that handrails are as long as the stairways.  Check any carpeting to make sure that it is firmly attached to the stairs. Fix any carpet that is loose or worn.  Avoid having throw rugs at the top or bottom of the stairs. If you do have throw rugs, attach them to the floor with carpet tape.  Make sure that you have a light switch at the top of the stairs and the bottom of the stairs. If you do not have them, ask someone to add them for you. What else can I do to help prevent falls?  Wear shoes that:  Do not have high heels.  Have rubber bottoms.  Are comfortable and fit you well.  Are closed at the toe. Do not wear sandals.  If you use a stepladder:  Make  sure that it is fully opened. Do not climb a closed stepladder.  Make sure that both sides of the stepladder are locked into place.  Ask someone to hold it for you, if possible.  Clearly mark and make sure that you can see:  Any grab bars or handrails.  First and last steps.  Where the edge of each step is.  Use tools that help you move around (mobility aids) if they are needed. These include:  Canes.  Walkers.  Scooters.  Crutches.  Turn on the lights when you go into a dark area. Replace any light bulbs as soon as they burn out.  Set up your furniture so you have a clear path. Avoid moving your furniture around.  If any of your floors are uneven, fix them.  If there are any pets around you, be aware of where they are.  Review your medicines with your doctor. Some medicines can make you feel dizzy. This can increase your chance of falling. Ask your doctor what other things that you can do to help prevent falls. This information is not intended to replace advice given to you by your health care provider. Make sure you discuss any questions you have with your health care provider. Document Released: 03/02/2009 Document Revised: 10/12/2015 Document Reviewed: 06/10/2014 Elsevier Interactive Patient Education  2017 [employer].

## 2019-04-12 NOTE — Progress Notes (Signed)
Subjective:   [name] is a 78 y.o. female who presents for Medicare Annual (Subsequent) preventive examination.  This visit is being conducted via phone call  - after an attmept to do on video chat - due to the COVID-19 pandemic. This patient has given me verbal consent via phone to conduct this visit, patient states they are participating from their home address. Some vital signs may be absent or patient reported.   Patient identification: identified by name, DOB, and current address.    Review of Systems:   Cardiac Risk Factors include: advanced age (>49men, >76 women)     Objective:     Vitals: Pulse 74   LMP  (LMP Unknown)   There is no height or weight on file to calculate BMI.  Advanced Directives 04/12/2019 01/14/2018 01/01/2018 12/04/2016 11/08/2016  Does Patient Have a Medical Advance Directive? No No No No No  Would patient like information on creating a medical advance directive? - - Yes (MAU/Ambulatory/Procedural Areas - Information given) No - Patient declined Yes (MAU/Ambulatory/Procedural Areas - Information given)    Tobacco Social History   Tobacco Use  Smoking Status Never Smoker  Smokeless Tobacco Never Used     Counseling given: Not Answered   Clinical Intake:                 Interpreter Needed?: No  Information entered by ::  ,LPN  Past Medical History:  Diagnosis Date  . Allergy   . Arthritis   . Asthma   . Chronic fatigue syndrome   . Fractured fibula   . GERD (gastroesophageal reflux disease)   . Hypertension   . Lymphedema 2004  . Neuromuscular disorder (HCC)   . Pneumonia    Past Surgical History:  Procedure Laterality Date  . ABDOMINAL HYSTERECTOMY  1974  . BIOPSY THYROID    . CHOLECYSTECTOMY  2005   Family History  Problem Relation Age of Onset  . Heart disease Mother   . Hyperlipidemia Mother   . Diabetes Father   . Alcoholism Father   . Cancer Brother   . Hypertension Son   . Stroke Maternal  Grandmother    Social History   Socioeconomic History  . Marital status: Widowed    Spouse name: Not on file  . Number of children: Not on file  . Years of education: 14 years  . Highest education level: Some college, no degree  Occupational History  . Not on file  Social Needs  . Financial resource strain: Not hard at all  . Food insecurity    Worry: Never true    Inability: Never true  . Transportation needs    Medical: No    Non-medical: No  Tobacco Use  . Smoking status: Never Smoker  . Smokeless tobacco: Never Used  Substance and Sexual Activity  . Alcohol use: No    Alcohol/week: 0.0 standard drinks  . Drug use: No  . Sexual activity: Yes  Lifestyle  . Physical activity    Days per week: 0 days    Minutes per session: 0 min  . Stress: Not at all  Relationships  . Social connections    Talks on phone: More than three times a week    Gets together: More than three times a week    Attends religious service: 1 to 4 times per year    Active member of club or organization: Yes    Attends meetings of clubs or organizations: More than 4  times per year    Relationship status: Widowed  Other Topics Concern  . Not on file  Social History Narrative   Card making club.       Working full time at [employer]     Outpatient Encounter Medications as of 04/12/2019  Medication Sig  . Apple Cider Vinegar 500 MG TABS Take 500 mg by mouth 2 (two) times a day.   Marland Kitchen aspirin 81 MG tablet Take 81 mg by mouth daily.  Marland Kitchen atorvastatin (LIPITOR) 10 MG tablet TAKE 1 TABLET(10 MG) BY MOUTH DAILY  . buPROPion (WELLBUTRIN SR) 150 MG 12 hr tablet Take 150 mg by mouth 2 (two) times daily.  . furosemide (LASIX) 40 MG tablet Take 1 tablet (40 mg total) by mouth daily as needed.  . irbesartan (AVAPRO) 300 MG tablet Take 1 tablet (300 mg total) by mouth daily.  . magnesium oxide (MAG-OX) 400 MG tablet Take 400 mg by mouth 2 (two) times a day.   . nebivolol (BYSTOLIC) 5 MG  tablet Take 5 mg by mouth daily.  Marland Kitchen omeprazole (PRILOSEC) 20 MG capsule TAKE 1 CAPSULE(20 MG) BY MOUTH DAILY  . Probiotic Product (PROBIOTIC PO) Take 1 tablet by mouth daily.  . Tiotropium Bromide Monohydrate (SPIRIVA RESPIMAT) 1.25 MCG/ACT AERS Inhale 2 puffs into the lungs daily.  . traZODone (DESYREL) 50 MG tablet Take 1 tablet (50 mg total) by mouth at bedtime as needed for sleep.  Marland Kitchen triamcinolone cream (KENALOG) 0.1 % APPLY TOPICALLY TO THE AFFECTED AREA TWICE DAILY  . vitamin C (ASCORBIC ACID) 500 MG tablet Take 500 mg by mouth daily.  . vitamin E 400 UNIT capsule Take 400 Units by mouth daily.  Monte Fantasia INHUB 250-50 MCG/DOSE AEPB INHALE 1 PUFF INTO THE LUNGS TWICE DAILY  . albuterol (PROVENTIL) (5 MG/ML) 0.5% nebulizer solution Take 0.5 mLs (2.5 mg total) by nebulization every 6 (six) hours as needed for wheezing or shortness of breath. (Patient not taking: Reported on 04/12/2019)  . Cholecalciferol (VITAMIN D-3 PO) Take 2,000 Units by mouth daily.    No facility-administered encounter medications on file as of 04/12/2019.     Activities of Daily Living In your present state of health, do you have any difficulty performing the following activities: 04/12/2019 03/12/2019  Hearing? N N  Vision? N Y  Comment eyeglasses, goes to Dr.[facility] left cataract  Difficulty concentrating or making decisions? Malvin Johns  Walking or climbing stairs? Y Y  Comment has ramp -  Dressing or bathing? N N  Doing errands, shopping? N N  Preparing Food and eating ? N -  Using the Toilet? N -  In the past six months, have you accidently leaked urine? Y -  Do you have problems with loss of bowel control? N -  Managing your Medications? N -  Managing your Finances? N -  Housekeeping or managing your Housekeeping? Y -  Comment family helps -  Some recent data might be hidden    Patient Care Team: Marjie Skiff, NP as PCP - General (Nurse Practitioner) Erin Fulling, MD as Consulting Physician (Pulmonary  Disease) Antonieta Iba, MD as Consulting Physician (Cardiology) Lourena Simmonds, [facility] as Pharmacist Gustavus Bryant, LCSW as Social Worker (Licensed Clinical Social Worker) Minor, Theadora Rama, RN as Case Manager    Assessment:   This is a routine wellness examination for Neyda.  Exercise Activities and Dietary recommendations Current Exercise Habits: The patient does not participate in regular exercise at present;Home  exercise routine, Type of exercise: stretching, Time (Minutes): 20, Frequency (Times/Week): 4, Weekly Exercise (Minutes/Week): 80, Intensity: Mild, Exercise limited by: None identified  Goals    . "I have a problem with my pulse dropping and I get dizzy" (pt-stated)     Current Barriers:  . Hx bradycardia, HTN, multiple medications changes made to attempt to control BP without dropping HR . Patient is currently taking irbesartan 150 mg QAM, nebivolol 2.5 mg at noon, irbesartan 150 mg in the afternoon, and nebivolol 2.5 mg at bedtime. Most stable BP/HR in a while  o Reports that she is particularly concerned with her HR dropping, as she was with her husband when he had a heart attack and it dropped to the 95s . Per chart review, no specific indication for beta blocker therapy o Last cardiology visit with [name] on 08/12/2016, he noted that she "likely doesn't need beta blocker therapy"  Pharmacist Clinical Goal(s):  Marland Kitchen Over the next 90 days, patient will work with PharmD and PCP to address needs related to optimal hypertension management  Interventions:  Congratulated patient on maintenance of goal BP/HR with current regimen. Encouraged to continue checking, and to try to not worry as much about lower HR  Patient Self Care Activities:  . Self administers medications as prescribed . Calls pharmacy for medication refills  . Monitor blood pressure 2-3 times weekly and will monitor pulse  Please see past updates related to this goal by clicking on the "Past Updates"  button in the selected goal      . "I take a lot of medications and I'm not sure I'm using them correctly" (pt-stated)     Current Barriers:  Marland Kitchen Knowledge Deficits related to appropriate medication management   . Patient previously taking diphenhydramine every evening to help with leg itching/sleep; she then tried melatonin, but had visual hallucinations . At last primary care provider appointment, trazodone 25 mg QHS PRN sleep was initiated. Patient notes that she took it every night when she was out of town, and continues to take fairly regularly. She feels it has been extremely beneficial, and she notes she is "much less tearful". She wonders if she can take it every night.  Pharmacist Clinical Goal(s):   Marland Kitchen Over the next 90 days, patient will work with PCP and CCM team to improve appropriate use of safe and effective medications  Interventions: . Discussed with patient that daily low dose trazodone is safe, if she is finding it beneficial. Will notify primary care provider [name] to determine how/when she would like to follow up with patient about her mood   Patient Self Care Activities:  . Does not self-administer prescription medications exactly as prescribed   Please see past updates related to this goal by clicking on the "Past Updates" button in the selected goal      . "My lyphedema is bad, my legs are itchy and swollen" (pt-stated)     Current Barriers:   Patient reports "leg muscle pain" that has gotten worse due to not being about to receive massage therapy. Described as "sore and painful - like working out for hours" and "feels like someone is pulling on my veins." It wakes her up at night and she has to adjust positions in order to sleep.   Patient states that her pain has gotten better since she stopped the B-12 and is ultilizing her furosemide more often   Pharmacist Clinical Goal(s):  Marland Kitchen Over the next 30 days, patient will  work with PCP and CCM team to address  needs related to management of lymphedema  Interventions: . Encouraged continued use of furosemide as prescribed and to report her use to PCP at next visit to determine if lab work is warrented  Patient Self Care Activities:  . Patient wears compression hose daily as directed . Patient will continue to work with CCM RN [name] for strategies to reduce pain and improve lymphedema   Please see past updates related to this goal by clicking on the "Past Updates" button in the selected goal      . I need to take better care of myself (pt-stated)     Current Barriers:  Marland Kitchen Knowledge Deficits related to maintaining optimal health with lymphedema and hypertension . Chronic Disease Management support and education needs related to weight management, lymphedema and hypertension  Nurse Case Manager Clinical Goal(s):  Marland Kitchen Over the next 90 days, patient will work with [facility] to address needs related to Weight Management, Lymphedema, and Hypertension . Over the next 90 days, the patient will demonstrate ongoing self health care management ability as evidenced by decreased weight, and increaseed adherence to self care activities*  Interventions:  . Evaluation of current treatment plan related to hypertension medication regimen and patient's adherence to plan as established by provider. . Provided education to patient re: Lymphedema management, weight management . Reviewed medications with patient and discussed new added meds and med times . Provided patient with verbal educational materials related to lymphedema . Advised patient, providing education and rationale, to check b/p qday and record, calling PCP for findings outside established parameters.   . Discussed weight management strategies and encouraged patient to begin using [name] which she stated she had lots of success with in the past. . Encouraged patient to use her compression hose which she admitted to "getting slack on" . Discussed  recent feelings of depression related to the loss of her spouse of 57 years and how this has affected her health.   Patient Self Care Activities:  . Currently UNABLE TO independently Manage weight, lymphedema, and hypertension effectively  Initial goal documentation     . Increase water intake     Recommend drinking at least 6-8 glasses of water a day    . SW-"I need additional support right now." (pt-stated)     Current Barriers:  . Financial constraints . ADL IADL limitations . Mental Health Symptoms . Social Isolation . Lacks knowledge of community resource: grief support resources within the area  Clinical Social Work Clinical Goal(s):  Marland Kitchen Over the next 120 days, client will work with SW to address concerns related to lack of self-care and ongoing grief over spouse  Interventions: . Patient interviewed and appropriate assessments performed . Provided mental health counseling regarding loss of spouse. Patient denies wanting grief counseling referral at this time. States that she has a large support network and has recently retired. Patient reports spending a lot of time crafting and painting to help cope.  Marland Kitchen LCSW provided emotional support and reflective listening throughout entire session.  . Provided patient with information about [facility] in case she changes her mind. . Patient reports that her daughter just got her a nice lift chair that massages.  . Patient reports that she had a fall recently and has an orthopedic appointment set for 03/31/2019.  Marland Kitchen Discussed plans with patient for ongoing care management follow up and provided patient with direct contact information for care management team . Advised patient  to to contact CCM LCSW if any urgent concerns arise.  . Assisted patient/caregiver with obtaining information about health plan benefits  Patient Self Care Activities:  . Attends all scheduled provider appointments . Calls provider office for new concerns  or questions  Please see past updates related to this goal by clicking on the "Past Updates" button in the selected goal         Fall Risk: Fall Risk  04/12/2019 11/11/2018 01/01/2018 07/23/2017 05/16/2017  Falls in the past year? 1 0 No No No  Number falls in past yr: 1 0 - - -  Injury with Fall? 1 - - - -  Risk for fall due to : History of fall(s);Impaired mobility - - - -  Follow up - Falls evaluation completed - - -    FALL RISK PREVENTION PERTAINING TO THE HOME:  Any stairs in or around the home? Yes  ramp  If so, are there any without handrails? No   Home free of loose throw rugs in walkways, pet beds, electrical cords, etc? Yes  Adequate lighting in your home to reduce risk of falls? Yes   ASSISTIVE DEVICES UTILIZED TO PREVENT FALLS:  Life alert? Yes  Use of a cane, walker or w/c? Yes  walker for transfer, power chair  Grab bars in the bathroom? No  Shower chair or bench in shower? Yes  Elevated toilet seat or a handicapped toilet? Yes   DME ORDERS:  DME order needed?  No   TIMED UP AND GO:  Unable to perform   Depression Screen PHQ 2/9 Scores 04/12/2019 12/22/2018 03/09/2018 03/09/2018  PHQ - 2 Score 0 0 0 0  PHQ- 9 Score - 2 3 3      Cognitive Function     6CIT Screen 01/01/2018 11/08/2016  What Year? 0 points 0 points  What month? 0 points 0 points  What time? 0 points 0 points  Count back from 20 0 points 0 points  Months in reverse 0 points 0 points  Repeat phrase 2 points 0 points  Total Score 2 0    Immunization History  Administered Date(s) Administered  . Influenza, High Dose Seasonal PF 02/21/2016, 02/12/2017  . Influenza,inj,Quad PF,6+ Mos 02/22/2015  . Influenza-Unspecified 02/16/2014  . Pneumococcal Conjugate-13 08/31/2013  . Td 08/31/2013  . Zoster 11/12/2011    Qualifies for Shingles Vaccine? Yes  Zostavax completed n/a. Due for Shingrix. Education has been provided regarding the importance of this vaccine. Pt has been advised to call  insurance company to determine out of pocket expense. Advised may also receive vaccine at local pharmacy or Health Dept. Verbalized acceptance and understanding.  Tdap: up to date   Flu Vaccine: due now- declined   Pneumococcal Vaccine: Due for booster Pneumococcal vaccine.   Screening Tests Health Maintenance  Topic Date Due  . INFLUENZA VACCINE  12/19/2018  . TETANUS/TDAP  09/01/2023  . DEXA SCAN  Completed  . PNA vac Low Risk Adult  Completed    Cancer Screenings:  Colorectal Screening: no longer required.  Mammogram: no longer required   Bone Density: no longer required   Lung Cancer Screening: (Low Dose CT Chest recommended if Age 40-80 years, 30 pack-year currently smoking OR have quit w/in 15years.) does not qualify.     Additional Screening:  Hepatitis C Screening: does not qualify  Vision Screening: Recommended annual ophthalmology exams for early detection of glaucoma and other disorders of the eye. Is the patient up to date with their annual  eye exam?  Yes  Who is the provider or what is the name of the office in which the pt attends annual eye exams? [facility]   Dental Screening: Recommended annual dental exams for proper oral hygiene  Community Resource Referral:  CRR required this visit?  No       Plan:  I have personally reviewed and addressed the Medicare Annual Wellness questionnaire and have noted the following in the patient's chart:  A. Medical and social history B. Use of alcohol, tobacco or illicit drugs  C. Current medications and supplements D. Functional ability and status E.  Nutritional status F.  Physical activity G. Advance directives H. List of other physicians I.  Hospitalizations, surgeries, and ER visits in previous 12 months J.  Vitals K. Screenings such as hearing and vision if needed, cognitive and depression L. Referrals and appointments   In addition, I have reviewed and discussed with patient certain preventive protocols,  quality metrics, and best practice recommendations. A written personalized care plan for preventive services as well as general preventive health recommendations were provided to patient.  Signed,    Collene Schlichter, LPN  16/02/9603 Nurse Health Advisor   Nurse Notes: none

## 2019-04-19 ENCOUNTER — Other Ambulatory Visit: Payer: Self-pay

## 2019-04-19 MED ORDER — BUPROPION HCL ER (SR) 150 MG PO TB12: 150.0000 mg | ORAL_TABLET | Freq: Two times a day (BID) | ORAL | 2 refills | Status: DC

## 2019-05-17 ENCOUNTER — Encounter (INDEPENDENT_AMBULATORY_CARE_PROVIDER_SITE_OTHER): Payer: BC Managed Care – PPO | Admitting: Vascular Surgery

## 2019-05-26 ENCOUNTER — Ambulatory Visit: Payer: Self-pay | Admitting: Licensed Clinical Social Worker

## 2019-05-26 ENCOUNTER — Telehealth: Payer: Medicare Other

## 2019-05-26 NOTE — Chronic Care Management (AMB) (Signed)
  Care Management   Follow Up Note   05/26/2019 Name: LADAJAH SOLTYS MRN: [id number] DOB: [birth year]  Referred by: Marjie Skiff, NP Reason for referral : Care Coordination   ZABRINA BROTHERTON is a 79 y.o. year old female who is a primary care patient of Cannady, Dorie Rank, NP. The care management team was consulted for assistance with care management and care coordination needs.    Review of patient status, including review of consultants reports, relevant laboratory and other test results, and collaboration with appropriate care team members and the patient's provider was performed as part of comprehensive patient evaluation and provision of chronic care management services.    LCSW completed CCM outreach attempt today but was unable to reach patient successfully. A HIPPA compliant voice message was left encouraging patient to return call once available. LCSW rescheduled CCM SW appointment as well.  A HIPPA compliant phone message was left for the patient providing contact information and requesting a return call.   Dickie La, BSW, MSW, LCSW [facility] [place]  [facility] [email or web address] Phone: [phone]

## 2019-06-07 ENCOUNTER — Other Ambulatory Visit: Payer: Self-pay

## 2019-06-07 ENCOUNTER — Ambulatory Visit (INDEPENDENT_AMBULATORY_CARE_PROVIDER_SITE_OTHER): Payer: BC Managed Care – PPO | Admitting: Vascular Surgery

## 2019-06-07 ENCOUNTER — Encounter (INDEPENDENT_AMBULATORY_CARE_PROVIDER_SITE_OTHER): Payer: Self-pay | Admitting: Vascular Surgery

## 2019-06-07 VITALS — BP 144/80 | HR 71 | Resp 16 | Wt 333.0 lb

## 2019-06-07 DIAGNOSIS — I872 Venous insufficiency (chronic) (peripheral): Secondary | ICD-10-CM | POA: Diagnosis not present

## 2019-06-07 DIAGNOSIS — I10 Essential (primary) hypertension: Secondary | ICD-10-CM

## 2019-06-07 DIAGNOSIS — E782 Mixed hyperlipidemia: Secondary | ICD-10-CM

## 2019-06-07 DIAGNOSIS — J4540 Moderate persistent asthma, uncomplicated: Secondary | ICD-10-CM | POA: Diagnosis not present

## 2019-06-07 DIAGNOSIS — I890 Lymphedema, not elsewhere classified: Secondary | ICD-10-CM

## 2019-06-08 ENCOUNTER — Encounter (INDEPENDENT_AMBULATORY_CARE_PROVIDER_SITE_OTHER): Payer: Self-pay | Admitting: Vascular Surgery

## 2019-06-08 NOTE — Progress Notes (Signed)
MRN : [id number]  [name] is a 79 y.o. (1940/10/17) female who presents with chief complaint of  Chief Complaint  Patient presents with  . New Patient (Initial Visit)    ref [place] Lymphedema  .  History of Present Illness:   Patient is seen for evaluation of leg pain and leg swelling. The patient first noticed the swelling remotely. The swelling is associated with pain and discoloration. The pain and swelling worsens with prolonged dependency and improves with elevation. The pain is unrelated to activity.  The patient notes that in the morning the legs are significantly improved but they steadily worsened throughout the course of the day. The patient also notes a steady worsening of the discoloration in the ankle and shin area.   The patient denies claudication symptoms.  The patient denies symptoms consistent with rest pain.  The patient denies and extensive history of DJD and LS spine disease.  The patient has no had any past angiography, interventions or vascular surgery.  Elevation makes the leg symptoms better, dependency makes them much worse. There is no history of ulcerations. The patient denies any recent changes in medications.  The patient has been struggling with wearing graduated compression as her stockings do not fit well and cause pain particularly up in the proximal calf area when she tries to wear them.  She also is suffering from severe degenerative disease and putting the stockings on has become increasingly problematic.  Approximately 5-6 years ago she did obtain a lymphedema pump.  Unfortunately the pump is not working at this time so she has not been able to use that on a routine basis.  The patient denies a history of DVT or PE. There is no prior history of phlebitis.  No history of malignancies. No history of trauma or groin or pelvic surgery. There is no history of radiation treatment to the groin or pelvis  The patient denies amaurosis fugax or recent TIA  symptoms. There are no recent neurological changes noted. The patient denies recent episodes of angina or shortness of breath  Current Meds  Medication Sig  . Apple Cider Vinegar 500 MG TABS Take 500 mg by mouth 2 (two) times a day.   Marland Kitchen aspirin 81 MG tablet Take 81 mg by mouth daily.  Marland Kitchen atorvastatin (LIPITOR) 10 MG tablet TAKE 1 TABLET(10 MG) BY MOUTH DAILY  . buPROPion (WELLBUTRIN SR) 150 MG 12 hr tablet Take 1 tablet (150 mg total) by mouth 2 (two) times daily.  . Cholecalciferol (VITAMIN D-3 PO) Take 2,000 Units by mouth daily.   . furosemide (LASIX) 40 MG tablet Take 1 tablet (40 mg total) by mouth daily as needed.  . irbesartan (AVAPRO) 300 MG tablet Take 1 tablet (300 mg total) by mouth daily.  . magnesium oxide (MAG-OX) 400 MG tablet Take 400 mg by mouth 2 (two) times a day.   . nebivolol (BYSTOLIC) 5 MG tablet Take 5 mg by mouth daily.  Marland Kitchen omeprazole (PRILOSEC) 20 MG capsule TAKE 1 CAPSULE(20 MG) BY MOUTH DAILY  . Probiotic Product (PROBIOTIC PO) Take 1 tablet by mouth daily.  . Tiotropium Bromide Monohydrate (SPIRIVA RESPIMAT) 1.25 MCG/ACT AERS Inhale 2 puffs into the lungs daily.  . traZODone (DESYREL) 50 MG tablet Take 1 tablet (50 mg total) by mouth at bedtime as needed for sleep.  Marland Kitchen triamcinolone cream (KENALOG) 0.1 % APPLY TOPICALLY TO THE AFFECTED AREA TWICE DAILY  . vitamin C (ASCORBIC ACID) 500 MG tablet Take 500 mg  by mouth daily.  . vitamin E 400 UNIT capsule Take 400 Units by mouth daily.  Grant Ruts INHUB 250-50 MCG/DOSE AEPB INHALE 1 PUFF INTO THE LUNGS TWICE DAILY    Past Medical History:  Diagnosis Date  . Allergy   . Arthritis   . Asthma   . Chronic fatigue syndrome   . Fractured fibula   . GERD (gastroesophageal reflux disease)   . Hypertension   . Lymphedema 2004  . Neuromuscular disorder ([place])   . Pneumonia     Past Surgical History:  Procedure Laterality Date  . ABDOMINAL HYSTERECTOMY  1974  . BIOPSY THYROID    . CHOLECYSTECTOMY  2005    Social  History Social History   Tobacco Use  . Smoking status: Never Smoker  . Smokeless tobacco: Never Used  Substance Use Topics  . Alcohol use: No    Alcohol/week: 0.0 standard drinks  . Drug use: No    Family History Family History  Problem Relation Age of Onset  . Heart disease Mother   . Hyperlipidemia Mother   . Diabetes Father   . Alcoholism Father   . Cancer Brother   . Hypertension Son   . Stroke Maternal Grandmother   No family history of bleeding/clotting disorders, porphyria or autoimmune disease   Allergies  Allergen Reactions  . Latex Rash  . Benicar [Olmesartan] Swelling  . Ciprofloxacin Other (See Comments)  . Diovan [Valsartan] Other (See Comments)    fatigue  . Flagyl [Metronidazole]   . Lotensin [Benazepril Hcl] Cough  . Penicillins   . Sulfa Antibiotics      REVIEW OF SYSTEMS (Negative unless checked)  Constitutional: [] Weight loss  [] Fever  [] Chills Cardiac: [] Chest pain   [] Chest pressure   [] Palpitations   [] Shortness of breath when laying flat   [] Shortness of breath with exertion. Vascular:  [] Pain in legs with walking   [x] Pain in legs at rest  [] History of DVT   [] Phlebitis   [x] Swelling in legs   [] Varicose veins   [] Non-healing ulcers Pulmonary:   [] Uses home oxygen   [] Productive cough   [] Hemoptysis   [] Wheeze  [] COPD   [x] Asthma Neurologic:  [] Dizziness   [] Seizures   [] History of stroke   [] History of TIA  [] Aphasia   [] Vissual changes   [] Weakness or numbness in arm   [] Weakness or numbness in leg Musculoskeletal:   [] Joint swelling   [] Joint pain   [] Low back pain Hematologic:  [] Easy bruising  [] Easy bleeding   [] Hypercoagulable state   [] Anemic Gastrointestinal:  [] Diarrhea   [] Vomiting  [x] Gastroesophageal reflux/heartburn   [] Difficulty swallowing. Genitourinary:  [] Chronic kidney disease   [] Difficult urination  [] Frequent urination   [] Blood in urine Skin:  [] Rashes   [] Ulcers  Psychological:  [] History of anxiety   []  History of  major depression.  Physical Examination  Vitals:   06/07/19 1535  BP: (!) 144/80  Pulse: 71  Resp: 16  Weight: (!) 333 lb (151 kg)   Body mass index is 58.06 kg/m. Gen: WD/WN, NAD seen in a wheelchair Head: [place]/AT, No temporalis wasting.  Ear/Nose/Throat: Hearing grossly intact, nares w/o erythema or drainage, poor dentition Eyes: PER, EOMI, sclera nonicteric.  Neck: Supple, no masses.  No bruit or JVD.  Pulmonary:  Good air movement, clear to auscultation bilaterally, no use of accessory muscles.  Cardiac: RRR, normal S1, S2, no Murmurs. Vascular: scattered varicosities present bilaterally.  Moderate venous stasis changes to the legs bilaterally.  3-4+ soft pitting edema  Vessel Right Left  Radial Palpable Palpable  PT Palpable Palpable  DP Palpable Palpable  Gastrointestinal: soft, non-distended. No guarding/no peritoneal signs.  Musculoskeletal: M/S 5/5 throughout.  No deformity or atrophy.  Neurologic: CN 2-12 intact. Pain and light touch intact in extremities.  Symmetrical.  Speech is fluent. Motor exam as listed above. Psychiatric: Judgment intact, Mood & affect appropriate for pt's clinical situation. Dermatologic: Moderate venous rashes no ulcers noted.  No changes consistent with cellulitis. Lymph : No Cervical lymphadenopathy, no lichenification or skin changes of chronic lymphedema.  CBC Lab Results  Component Value Date   WBC 4.1 06/08/2018   HGB 15.0 06/08/2018   HCT 43.9 06/08/2018   MCV 90 06/08/2018   PLT 229 06/08/2018    BMET    Component Value Date/Time   NA 137 12/22/2018 1648   K 4.9 12/22/2018 1648   CL 102 12/22/2018 1648   CO2 19 (L) 12/22/2018 1648   GLUCOSE 87 12/22/2018 1648   BUN 22 12/22/2018 1648   CREATININE 0.91 12/22/2018 1648   CALCIUM 10.6 (H) 12/22/2018 1648   GFRNONAA 61 12/22/2018 1648   GFRAA 70 12/22/2018 1648   CrCl cannot be calculated (Patient's most recent lab result is older than the maximum 21 days  allowed.).  COAG No results found for: INR, PROTIME  Radiology No results found.   Assessment/Plan 1. Lymphedema Recommend:  No surgery or intervention at this point in time.    I have reviewed my previous discussion with the patient regarding swelling and why it causes symptoms.  Patient will continue wearing graduated compression stockings class 1 (20-30 mmHg) on a daily basis. The patient will  beginning wearing the stockings first thing in the morning and removing them in the evening. The patient is instructed specifically not to sleep in the stockings.    In addition, behavioral modification including several periods of elevation of the lower extremities during the day will be continued.  This was reviewed with the patient during the initial visit.  The patient will also continue routine exercise, especially walking on a daily basis as was discussed during the initial visit.    Despite conservative treatments including graduated compression therapy class 1 and behavioral modification including exercise and elevation the patient  has not obtained adequate control of the lymphedema.  The patient still has stage 3 lymphedema and therefore, I believe that a lymph pump should be added to improve the control of the patient's lymphedema.  Additionally, a lymph pump is warranted because it will reduce the risk of cellulitis and ulceration in the future.  Patient should follow-up in six months   - VAS [place] LOWER EXTREMITY VENOUS (DVT); Future  2. Chronic venous insufficiency No surgery or intervention at this point in time.    I have had a long discussion with the patient regarding venous insufficiency and why it  causes symptoms. I have discussed with the patient the chronic skin changes that accompany venous insufficiency and the long term sequela such as infection and ulceration.  Patient will begin wearing graduated compression stockings class 1 (20-30 mmHg) or compression wraps on a  daily basis a prescription was given. The patient will put the stockings on first thing in the morning and removing them in the evening. The patient is instructed specifically not to sleep in the stockings.    In addition, behavioral modification including several periods of elevation of the lower extremities during the day will be continued. I have demonstrated that proper elevation  is a position with the ankles at heart level.  The patient is instructed to begin routine exercise, especially walking on a daily basis  Patient should undergo duplex ultrasound of the venous system to ensure that DVT or reflux is not present.  Following the review of the ultrasound the patient will follow up in 2-3 months to reassess the degree of swelling and the control that graduated compression stockings or compression wraps  is offering.   The patient can be assessed for a Lymph Pump at that time - VAS [place] LOWER EXTREMITY VENOUS (DVT); Future  3. Essential hypertension, benign Continue antihypertensive medications as already ordered, these medications have been reviewed and there are no changes at this time.   4. Moderate persistent asthma, unspecified whether complicated Continue pulmonary medications and aerosols as already ordered, these medications have been reviewed and there are no changes at this time.    5. Mixed hyperlipidemia Continue statin as ordered and reviewed, no changes at this time     Levora Dredge, MD  06/08/2019 7:58 AM

## 2019-06-11 ENCOUNTER — Ambulatory Visit (INDEPENDENT_AMBULATORY_CARE_PROVIDER_SITE_OTHER): Payer: BC Managed Care – PPO

## 2019-06-11 ENCOUNTER — Ambulatory Visit (INDEPENDENT_AMBULATORY_CARE_PROVIDER_SITE_OTHER): Payer: BC Managed Care – PPO | Admitting: Nurse Practitioner

## 2019-06-11 ENCOUNTER — Encounter (INDEPENDENT_AMBULATORY_CARE_PROVIDER_SITE_OTHER): Payer: Self-pay | Admitting: Nurse Practitioner

## 2019-06-11 ENCOUNTER — Other Ambulatory Visit: Payer: Self-pay

## 2019-06-11 VITALS — BP 140/71 | HR 65 | Resp 16

## 2019-06-11 DIAGNOSIS — I890 Lymphedema, not elsewhere classified: Secondary | ICD-10-CM

## 2019-06-11 DIAGNOSIS — I10 Essential (primary) hypertension: Secondary | ICD-10-CM | POA: Diagnosis not present

## 2019-06-11 DIAGNOSIS — I872 Venous insufficiency (chronic) (peripheral): Secondary | ICD-10-CM

## 2019-06-11 DIAGNOSIS — Z7409 Other reduced mobility: Secondary | ICD-10-CM | POA: Diagnosis not present

## 2019-06-16 ENCOUNTER — Encounter (INDEPENDENT_AMBULATORY_CARE_PROVIDER_SITE_OTHER): Payer: Self-pay | Admitting: Nurse Practitioner

## 2019-06-16 NOTE — Progress Notes (Signed)
SUBJECTIVE:  Patient ID: [name], female    DOB: May 31, 1940, 79 y.o.   MRN: [id number] Chief Complaint  Patient presents with  . Follow-up    ultrasound follow up    HPI  [name] is a 79 y.o. female that presents today for follow-up with noninvasive studies to evaluate lower extremity edema.The patient returns to the office for followup evaluation regarding leg swelling.  The swelling has persisted and the pain associated with swelling continues. There have not been any interval development of a ulcerations or wounds.  Since the previous visit the patient has been been fitted for a lymphedema pump and continues to try to do things such as elevation to help with her lower extremity edema.  Today noninvasive studies show that the patient has no evidence of chronic venous insufficiency bilaterally.  No evidence of superficial venous thrombosis or DVT bilaterally.  There does appear to be a Baker's cyst behind the right knee measuring 2.26 cm x 1.62 cm   Past Medical History:  Diagnosis Date  . Allergy   . Arthritis   . Asthma   . Chronic fatigue syndrome   . Fractured fibula   . GERD (gastroesophageal reflux disease)   . Hypertension   . Lymphedema 2004  . Neuromuscular disorder ([facility])   . Pneumonia     Past Surgical History:  Procedure Laterality Date  . ABDOMINAL HYSTERECTOMY  1974  . BIOPSY THYROID    . CHOLECYSTECTOMY  2005    Social History   Socioeconomic History  . Marital status: Widowed    Spouse name: Not on file  . Number of children: Not on file  . Years of education: 14 years  . Highest education level: Some college, no degree  Occupational History  . Not on file  Tobacco Use  . Smoking status: Never Smoker  . Smokeless tobacco: Never Used  Substance and Sexual Activity  . Alcohol use: No    Alcohol/week: 0.0 standard drinks  . Drug use: No  . Sexual activity: Yes  Other Topics Concern  . Not on file  Social History Narrative   Card making  club.       Working full time at [employer] school    Social Determinants of Health   Financial Resource Strain:   . Difficulty of Paying Living Expenses: Not on file  Food Insecurity:   . Worried About Programme researcher, broadcasting/film/video in the Last Year: Not on file  . Ran Out of Food in the Last Year: Not on file  Transportation Needs:   . Lack of Transportation (Medical): Not on file  . Lack of Transportation (Non-Medical): Not on file  Physical Activity:   . Days of Exercise per Week: Not on file  . Minutes of Exercise per Session: Not on file  Stress:   . Feeling of Stress : Not on file  Social Connections:   . Frequency of Communication with Friends and Family: Not on file  . Frequency of Social Gatherings with Friends and Family: Not on file  . Attends Religious Services: Not on file  . Active Member of Clubs or Organizations: Not on file  . Attends Banker Meetings: Not on file  . Marital Status: Not on file  Intimate Partner Violence:   . Fear of Current or Ex-Partner: Not on file  . Emotionally Abused: Not on file  . Physically Abused: Not on file  . Sexually Abused: Not on file  Family History  Problem Relation Age of Onset  . Heart disease Mother   . Hyperlipidemia Mother   . Diabetes Father   . Alcoholism Father   . Cancer Brother   . Hypertension Son   . Stroke Maternal Grandmother     Allergies  Allergen Reactions  . Latex Rash  . Benicar [Olmesartan] Swelling  . Ciprofloxacin Other (See Comments)  . Diovan [Valsartan] Other (See Comments)    fatigue  . Flagyl [Metronidazole]   . Lotensin [Benazepril Hcl] Cough  . Penicillins   . Sulfa Antibiotics      Review of Systems   Review of Systems: Negative Unless Checked Constitutional: [] Weight loss  [] Fever  [] Chills Cardiac: [] Chest pain   []  Atrial Fibrillation  [] Palpitations   [] Shortness of breath when laying flat   [] Shortness of breath with exertion. [] Shortness of breath at  rest Vascular:  [] Pain in legs with walking   [] Pain in legs with standing [] Pain in legs when laying flat   [] Claudication    [] Pain in feet when laying flat    [] History of DVT   [] Phlebitis   [] Swelling in legs   [] Varicose veins   [] Non-healing ulcers Pulmonary:   [] Uses home oxygen   [] Productive cough   [] Hemoptysis   [] Wheeze  [] COPD   [x] Asthma Neurologic:  [] Dizziness   [] Seizures  [] Blackouts [] History of stroke   [] History of TIA  [] Aphasia   [] Temporary Blindness   [] Weakness or numbness in arm   [x] Weakness or numbness in leg Musculoskeletal:   [] Joint swelling   [] Joint pain   [] Low back pain  []  History of Knee Replacement [] Arthritis [] back Surgeries  []  Spinal Stenosis    Hematologic:  [] Easy bruising  [] Easy bleeding   [] Hypercoagulable state   [] Anemic Gastrointestinal:  [] Diarrhea   [] Vomiting  [x] Gastroesophageal reflux/heartburn   [] Difficulty swallowing. [] Abdominal pain Genitourinary:  [] Chronic kidney disease   [] Difficult urination  [] Anuric   [] Blood in urine [] Frequent urination  [] Burning with urination   [] Hematuria Skin:  [] Rashes   [] Ulcers [] Wounds Psychological:  [] History of anxiety   [x]  History of major depression  []  Memory Difficulties      OBJECTIVE:   Physical Exam  BP 140/71 (BP Location: Right Arm)   Pulse 65   Resp 16   LMP  (LMP Unknown)   Gen: WD/WN, NAD Head: [place]/AT, No temporalis wasting.  Ear/Nose/Throat: Hearing grossly intact, nares w/o erythema or drainage Eyes: PER, EOMI, sclera nonicteric.  Neck: Supple, no masses.  No JVD.  Pulmonary:  Good air movement, no use of accessory muscles.  Cardiac: RRR Vascular:  2-3+ edema bilaterally  Gastrointestinal: soft, non-distended. No guarding/no peritoneal signs.  Musculoskeletal: Wheelchair-bound.  No deformity or atrophy.  Neurologic: Pain and light touch intact in extremities.  Symmetrical.  Speech is fluent. Motor exam as listed above. Psychiatric: Judgment intact, Mood & affect  appropriate for pt's clinical situation. Dermatologic: No Venous rashes. No Ulcers Noted.  No changes consistent with cellulitis. Lymph : No Cervical lymphadenopathy, bilateral dermal thickening      ASSESSMENT AND PLAN:  1. Lymphedema Patient recently had her fitting for a lymphedema pump and it should arrive to her soon.  We discussed [place] wraps for compression.  Patient was given a prescription today.  We also discussed wound wraps however the patient had those previously and she was unable to tolerate.  Patient is advised to continue with conservative therapy and we will have her return to the office in 4  months to reevaluate her progression with the lymphedema pump.  Patient is advised to contact her office sooner if there are issues with swelling that are unable to be controlled with conservative therapy.  2. Essential hypertension, benign BP good today.  No changes today  3. Mobility impaired Prolonged dependency may exacerbate lower extremity edema.  Consistent conservative therapy will help with this as well   Current Outpatient Medications on File Prior to Visit  Medication Sig Dispense Refill  . Apple Cider Vinegar 500 MG TABS Take 500 mg by mouth 2 (two) times a day.     Marland Kitchen aspirin 81 MG tablet Take 81 mg by mouth daily.    Marland Kitchen atorvastatin (LIPITOR) 10 MG tablet TAKE 1 TABLET(10 MG) BY MOUTH DAILY 90 tablet 3  . buPROPion (WELLBUTRIN SR) 150 MG 12 hr tablet Take 1 tablet (150 mg total) by mouth 2 (two) times daily. 180 tablet 2  . Cholecalciferol (VITAMIN D-3 PO) Take 2,000 Units by mouth daily.     . furosemide (LASIX) 40 MG tablet Take 1 tablet (40 mg total) by mouth daily as needed. 90 tablet 2  . irbesartan (AVAPRO) 300 MG tablet Take 1 tablet (300 mg total) by mouth daily. 90 tablet 3  . magnesium oxide (MAG-OX) 400 MG tablet Take 400 mg by mouth 2 (two) times a day.     . nebivolol (BYSTOLIC) 5 MG tablet Take 5 mg by mouth daily.    Marland Kitchen omeprazole (PRILOSEC) 20 MG capsule  TAKE 1 CAPSULE(20 MG) BY MOUTH DAILY 90 capsule 2  . Probiotic Product (PROBIOTIC PO) Take 1 tablet by mouth daily.    . Tiotropium Bromide Monohydrate (SPIRIVA RESPIMAT) 1.25 MCG/ACT AERS Inhale 2 puffs into the lungs daily. 1 Inhaler 5  . traZODone (DESYREL) 50 MG tablet Take 1 tablet (50 mg total) by mouth at bedtime as needed for sleep. 90 tablet 2  . triamcinolone cream (KENALOG) 0.1 % APPLY TOPICALLY TO THE AFFECTED AREA TWICE DAILY 454 g 0  . vitamin C (ASCORBIC ACID) 500 MG tablet Take 500 mg by mouth daily.    . vitamin E 400 UNIT capsule Take 400 Units by mouth daily.    Grant Ruts INHUB 250-50 MCG/DOSE AEPB INHALE 1 PUFF INTO THE LUNGS TWICE DAILY 1 each 5  . albuterol (PROVENTIL) (5 MG/ML) 0.5% nebulizer solution Take 0.5 mLs (2.5 mg total) by nebulization every 6 (six) hours as needed for wheezing or shortness of breath. (Patient not taking: Reported on 04/12/2019) 20 mL 12   No current facility-administered medications on file prior to visit.    There are no Patient Instructions on file for this visit. No follow-ups on file.   Kris Hartmann, NP  This note was completed with Sales executive.  Any errors are purely unintentional.

## 2019-06-22 ENCOUNTER — Telehealth: Payer: Self-pay | Admitting: Nurse Practitioner

## 2019-06-22 NOTE — Telephone Encounter (Signed)
Patient is calling to report that she received a letter from her [employer] stating the provider moved to a new location or new practice that may not parcticapte with BCBS. Please advise Cb- [phone]

## 2019-06-22 NOTE — Telephone Encounter (Signed)
This is not a patient at our practice

## 2019-07-09 ENCOUNTER — Telehealth (INDEPENDENT_AMBULATORY_CARE_PROVIDER_SITE_OTHER): Payer: BC Managed Care – PPO | Admitting: Nurse Practitioner

## 2019-07-09 ENCOUNTER — Ambulatory Visit: Payer: Medicare Other | Admitting: Nurse Practitioner

## 2019-07-09 ENCOUNTER — Telehealth: Payer: Self-pay | Admitting: Nurse Practitioner

## 2019-07-09 ENCOUNTER — Encounter: Payer: Self-pay | Admitting: Nurse Practitioner

## 2019-07-09 VITALS — HR 62

## 2019-07-09 DIAGNOSIS — F3340 Major depressive disorder, recurrent, in remission, unspecified: Secondary | ICD-10-CM | POA: Diagnosis not present

## 2019-07-09 DIAGNOSIS — Z6841 Body Mass Index (BMI) 40.0 and over, adult: Secondary | ICD-10-CM | POA: Diagnosis not present

## 2019-07-09 DIAGNOSIS — E782 Mixed hyperlipidemia: Secondary | ICD-10-CM | POA: Diagnosis not present

## 2019-07-09 DIAGNOSIS — I890 Lymphedema, not elsewhere classified: Secondary | ICD-10-CM | POA: Diagnosis not present

## 2019-07-09 DIAGNOSIS — F5101 Primary insomnia: Secondary | ICD-10-CM | POA: Diagnosis not present

## 2019-07-09 DIAGNOSIS — I10 Essential (primary) hypertension: Secondary | ICD-10-CM | POA: Diagnosis not present

## 2019-07-09 DIAGNOSIS — G4733 Obstructive sleep apnea (adult) (pediatric): Secondary | ICD-10-CM

## 2019-07-09 NOTE — Assessment & Plan Note (Signed)
Chronic, ongoing.  Continue current medication regimen and adjust as needed.  Lipid panel next visit.  Return in 4 weeks for face to face visit.

## 2019-07-09 NOTE — Patient Instructions (Signed)
DASH Eating Plan DASH stands for "Dietary Approaches to Stop Hypertension." The DASH eating plan is a healthy eating plan that has been shown to reduce high blood pressure (hypertension). It may also reduce your risk for type 2 diabetes, heart disease, and stroke. The DASH eating plan may also help with weight loss. What are tips for following this plan?  General guidelines  Avoid eating more than 2,300 mg (milligrams) of salt (sodium) a day. If you have hypertension, you may need to reduce your sodium intake to 1,500 mg a day.  Limit alcohol intake to no more than 1 drink a day for nonpregnant women and 2 drinks a day for men. One drink equals 12 oz of beer, 5 oz of wine, or 1 oz of hard liquor.  Work with your health care provider to maintain a healthy body weight or to lose weight. Ask what an ideal weight is for you.  Get at least 30 minutes of exercise that causes your heart to beat faster (aerobic exercise) most days of the week. Activities may include walking, swimming, or biking.  Work with your health care provider or diet and nutrition specialist (dietitian) to adjust your eating plan to your individual calorie needs. Reading food labels   Check food labels for the amount of sodium per serving. Choose foods with less than 5 percent of the Daily Value of sodium. Generally, foods with less than 300 mg of sodium per serving fit into this eating plan.  To find whole grains, look for the word "whole" as the first word in the ingredient list. Shopping  Buy products labeled as "low-sodium" or "no salt added."  Buy fresh foods. Avoid canned foods and premade or frozen meals. Cooking  Avoid adding salt when cooking. Use salt-free seasonings or herbs instead of table salt or sea salt. Check with your health care provider or pharmacist before using salt substitutes.  Do not fry foods. Cook foods using healthy methods such as baking, boiling, grilling, and broiling instead.  Cook with  heart-healthy oils, such as olive, canola, soybean, or sunflower oil. Meal planning  Eat a balanced diet that includes: ? 5 or more servings of fruits and vegetables each day. At each meal, try to fill half of your plate with fruits and vegetables. ? Up to 6-8 servings of whole grains each day. ? Less than 6 oz of lean meat, poultry, or fish each day. A 3-oz serving of meat is about the same size as a deck of cards. One egg equals 1 oz. ? 2 servings of low-fat dairy each day. ? A serving of nuts, seeds, or beans 5 times each week. ? Heart-healthy fats. Healthy fats called Omega-3 fatty acids are found in foods such as flaxseeds and coldwater fish, like sardines, salmon, and mackerel.  Limit how much you eat of the following: ? Canned or prepackaged foods. ? Food that is high in trans fat, such as fried foods. ? Food that is high in saturated fat, such as fatty meat. ? Sweets, desserts, sugary drinks, and other foods with added sugar. ? Full-fat dairy products.  Do not salt foods before eating.  Try to eat at least 2 vegetarian meals each week.  Eat more home-cooked food and less restaurant, buffet, and fast food.  When eating at a restaurant, ask that your food be prepared with less salt or no salt, if possible. What foods are recommended? The items listed may not be a complete list. Talk with your dietitian about   what dietary choices are best for you. Grains Whole-grain or whole-wheat bread. Whole-grain or whole-wheat pasta. Brown rice. Oatmeal. Quinoa. Bulgur. Whole-grain and low-sodium cereals. Pita bread. Low-fat, low-sodium crackers. Whole-wheat flour tortillas. Vegetables Fresh or frozen vegetables (raw, steamed, roasted, or grilled). Low-sodium or reduced-sodium tomato and vegetable juice. Low-sodium or reduced-sodium tomato sauce and tomato paste. Low-sodium or reduced-sodium canned vegetables. Fruits All fresh, dried, or frozen fruit. Canned fruit in natural juice (without  added sugar). Meat and other protein foods Skinless chicken or turkey. Ground chicken or turkey. Pork with fat trimmed off. Fish and seafood. Egg whites. Dried beans, peas, or lentils. Unsalted nuts, nut butters, and seeds. Unsalted canned beans. Lean cuts of beef with fat trimmed off. Low-sodium, lean deli meat. Dairy Low-fat (1%) or fat-free (skim) milk. Fat-free, low-fat, or reduced-fat cheeses. Nonfat, low-sodium ricotta or cottage cheese. Low-fat or nonfat yogurt. Low-fat, low-sodium cheese. Fats and oils Soft margarine without trans fats. Vegetable oil. Low-fat, reduced-fat, or light mayonnaise and salad dressings (reduced-sodium). Canola, safflower, olive, soybean, and sunflower oils. Avocado. Seasoning and other foods Herbs. Spices. Seasoning mixes without salt. Unsalted popcorn and pretzels. Fat-free sweets. What foods are not recommended? The items listed may not be a complete list. Talk with your dietitian about what dietary choices are best for you. Grains Baked goods made with fat, such as croissants, muffins, or some breads. Dry pasta or rice meal packs. Vegetables Creamed or fried vegetables. Vegetables in a cheese sauce. Regular canned vegetables (not low-sodium or reduced-sodium). Regular canned tomato sauce and paste (not low-sodium or reduced-sodium). Regular tomato and vegetable juice (not low-sodium or reduced-sodium). Pickles. Olives. Fruits Canned fruit in a light or heavy syrup. Fried fruit. Fruit in cream or butter sauce. Meat and other protein foods Fatty cuts of meat. Ribs. Fried meat. Bacon. Sausage. Bologna and other processed lunch meats. Salami. Fatback. Hotdogs. Bratwurst. Salted nuts and seeds. Canned beans with added salt. Canned or smoked fish. Whole eggs or egg yolks. Chicken or turkey with skin. Dairy Whole or 2% milk, cream, and half-and-half. Whole or full-fat cream cheese. Whole-fat or sweetened yogurt. Full-fat cheese. Nondairy creamers. Whipped toppings.  Processed cheese and cheese spreads. Fats and oils Butter. Stick margarine. Lard. Shortening. Ghee. Bacon fat. Tropical oils, such as coconut, palm kernel, or palm oil. Seasoning and other foods Salted popcorn and pretzels. Onion salt, garlic salt, seasoned salt, table salt, and sea salt. Worcestershire sauce. Tartar sauce. Barbecue sauce. Teriyaki sauce. Soy sauce, including reduced-sodium. Steak sauce. Canned and packaged gravies. Fish sauce. Oyster sauce. Cocktail sauce. Horseradish that you find on the shelf. Ketchup. Mustard. Meat flavorings and tenderizers. Bouillon cubes. Hot sauce and Tabasco sauce. Premade or packaged marinades. Premade or packaged taco seasonings. Relishes. Regular salad dressings. Where to find more information:  National Heart, Lung, and Blood Institute: www.nhlbi.nih.gov  American Heart Association: www.heart.org Summary  The DASH eating plan is a healthy eating plan that has been shown to reduce high blood pressure (hypertension). It may also reduce your risk for type 2 diabetes, heart disease, and stroke.  With the DASH eating plan, you should limit salt (sodium) intake to 2,300 mg a day. If you have hypertension, you may need to reduce your sodium intake to 1,500 mg a day.  When on the DASH eating plan, aim to eat more fresh fruits and vegetables, whole grains, lean proteins, low-fat dairy, and heart-healthy fats.  Work with your health care provider or diet and nutrition specialist (dietitian) to adjust your eating plan to your   individual calorie needs. This information is not intended to replace advice given to you by your health care provider. Make sure you discuss any questions you have with your health care provider. Document Revised: 04/18/2017 Document Reviewed: 04/29/2016 Elsevier Patient Education  2020 Elsevier Inc.  

## 2019-07-09 NOTE — Assessment & Plan Note (Signed)
Chronic, stable.  Continue current medication regimen and adjust as needed based on mood.  Denies SI/HI.

## 2019-07-09 NOTE — Assessment & Plan Note (Signed)
Chronic, ongoing with majority at home BP readings at goal for age. Continue current medication regimen and adjust as needed + collaboration with cardiology.  CMP in 4 weeks while in office for visit.

## 2019-07-09 NOTE — Telephone Encounter (Signed)
noted 

## 2019-07-09 NOTE — Assessment & Plan Note (Signed)
Recommend continued focus on healthy diet choices and regular physical activity (30 minutes 5 days a week).  Praised for recent weight loss.

## 2019-07-09 NOTE — Assessment & Plan Note (Signed)
Chronic, ongoing.  Continue collaboration with vascular and lymph pump + massage therapy.

## 2019-07-09 NOTE — Assessment & Plan Note (Signed)
Recommend continued focus on healthy diet choices and regular physical activity (30 minutes 5 days a week).  Praised for recent weight loss.  

## 2019-07-09 NOTE — Telephone Encounter (Signed)
Copied from CRM [id number]. Topic: General - Other >> Jul 09, 2019  8:33 AM Tamela Oddi wrote: Reason for CRM: Patient called to ask if her appt. This afternoon could be a phone or virtual visit because of the weather.  Tried the office, but no answer.  Please call patient to let her know at [phone]

## 2019-07-09 NOTE — Assessment & Plan Note (Signed)
Chronic, ongoing.  Benefit from Trazodone at night as needed.  Continue current medication regimen and adjust as needed.  Continue focus on sleep hygiene. 

## 2019-07-09 NOTE — Progress Notes (Signed)
Pulse 62   LMP  (LMP Unknown)   SpO2 98%    Subjective:    Patient ID: [name], female    DOB: 15-Apr-1941, 79 y.o.   MRN: [id number]  HPI: [name] is a 79 y.o. female  Chief Complaint  Patient presents with  . Depression  . Hypertension    . This visit was completed via MyChart due to the restrictions of the COVID-19 pandemic. All issues as above were discussed and addressed. Physical exam was done as above through visual confirmation on MyChart. If it was felt that the patient should be evaluated in the office, they were directed there. The patient verbally consented to this visit. . Location of the patient: home . Location of the provider: work . Those involved with this call:  . Provider: Aura Dials, DNP . CMA: Wilhemena Durie, CMA . Front Desk/Registration: Adela Ports  . Time spent on call: 15 minutes with patient face to face via video conference. More than 50% of this time was spent in counseling and coordination of care. 10 minutes total spent in review of patient's record and preparation of their chart.  . I verified patient identity using two factors (patient name and date of birth). Patient consents verbally to being seen via telemedicine visit today.    DEPRESSION & INSOMNIA Continues on Wellbutrin 150 MG BID and Trazodone 50 MG at bedtime as needed.  Uses CPAP 100% of the time at night. Mood status: stable Satisfied with current treatment?: yes Symptom severity: mild  Duration of current treatment : chronic Side effects: no Medication compliance: good compliance Psychotherapy/counseling: none Depressed mood: no Anxious mood: no Anhedonia: no Significant weight loss or gain: no Insomnia: none -- takes Trazodone once or twice a week Fatigue: no Feelings of worthlessness or guilt: no Impaired concentration/indecisiveness: no Suicidal ideations: no Hopelessness: no Crying spells: no Depression screen [facility] 2/9 07/09/2019 04/12/2019 12/22/2018  03/09/2018 03/09/2018  Decreased Interest 0 0 0 0 0  Down, Depressed, Hopeless 0 0 0 0 0  PHQ - 2 Score 0 0 0 0 0  Altered sleeping 0 - 0 1 1  Tired, decreased energy 0 - 2 2 2   Change in appetite 0 - 0 0 0  Feeling bad or failure about yourself  0 - 0 0 0  Trouble concentrating 0 - 0 0 0  Moving slowly or fidgety/restless 0 - 0 0 0  Suicidal thoughts 0 - 0 0 0  PHQ-9 Score 0 - 2 3 3   Difficult doing work/chores Not difficult at all - Not difficult at all - -  Some recent data might be hidden   HYPERTENSION / HYPERLIPIDEMIA Continues on Furosemide 40 MG PRN, ASA, Avapro 300 MG daily, Bystolic 5 MG daily, and Atorvastatin 10 MG daily.  Has lost about 30 pounds, using calorie counting.  Saw Dr. on 02/17/2019 and he has recommended she go to vascular for follow-up on her lymphedema, as she has been unable to attend massage as much due to Covid. She saw Dr. Mariah Milling on 06/07/2019 and lymph pump added to improve control of lymphedema.  Can not get the lymph pump until the 25th of March because of Medicare, her massage therapy has opened backup and she is scheduled to see her.  Satisfied with current treatment? yes Duration of hypertension: chronic BP monitoring frequency: a few times a week BP range: 140/70 at vascular recently, her BP cuff broke recently, prior to this had numbers 130-140/70 range  BP medication side effects: no Duration of hyperlipidemia: chronic Cholesterol medication side effects: no Cholesterol supplements: none Medication compliance: good compliance Aspirin: yes Recent stressors: no Recurrent headaches: no Visual changes: no Palpitations: no Dyspnea: no Chest pain: no Lower extremity edema: no Dizzy/lightheaded: no   Relevant past medical, surgical, family and social history reviewed and updated as indicated. Interim medical history since our last visit reviewed. Allergies and medications reviewed and updated.  Review of Systems  Constitutional: Negative  for activity change, appetite change, diaphoresis, fatigue and fever.  Respiratory: Negative for cough, chest tightness, shortness of breath and wheezing.   Cardiovascular: Positive for leg swelling. Negative for chest pain and palpitations.  Gastrointestinal: Negative.   Neurological: Negative.   Psychiatric/Behavioral: Negative.     Per HPI unless specifically indicated above     Objective:    Pulse 62   LMP  (LMP Unknown)   SpO2 98%   Wt Readings from Last 3 Encounters:  06/07/19 (!) 333 lb (151 kg)  06/08/18 (!) 357 lb (161.9 kg)  03/09/18 (!) 353 lb (160.1 kg)    Physical Exam Vitals and nursing note reviewed.  Constitutional:      General: She is awake. She is not in acute distress.    Appearance: She is well-developed. She is obese. She is not ill-appearing.  HENT:     Head: Normocephalic.     Right Ear: Hearing normal.     Left Ear: Hearing normal.  Eyes:     General: Lids are normal.        Right eye: No discharge.        Left eye: No discharge.     Conjunctiva/sclera: Conjunctivae normal.  Pulmonary:     Effort: Pulmonary effort is normal. No accessory muscle usage or respiratory distress.  Musculoskeletal:     Cervical back: Normal range of motion.  Neurological:     Mental Status: She is alert and oriented to person, place, and time.  Psychiatric:        Attention and Perception: Attention normal.        Mood and Affect: Mood normal.        Behavior: Behavior normal. Behavior is cooperative.        Thought Content: Thought content normal.        Judgment: Judgment normal.     Results for orders placed or performed in visit on 12/22/18  Lipid Panel w/o Chol/HDL Ratio  Result Value Ref Range   Cholesterol, Total 195 100 - 199 mg/dL   Triglycerides 90 0 - 149 mg/dL   HDL 95 >54 mg/dL   VLDL Cholesterol Cal 18 5 - 40 mg/dL   LDL Calculated 82 0 - 99 mg/dL  Vitamin O27  Result Value Ref Range   Vitamin B-12 642 232 - 1,245 pg/mL  Comprehensive  metabolic panel  Result Value Ref Range   Glucose 87 65 - 99 mg/dL   BUN 22 8 - 27 mg/dL   Creatinine, Ser 0.35 0.57 - 1.00 mg/dL   GFR calc non Af Amer 61 >59 mL/min/1.73   GFR calc Af Amer 70 >59 mL/min/1.73   BUN/Creatinine Ratio 24 12 - 28   Sodium 137 134 - 144 mmol/L   Potassium 4.9 3.5 - 5.2 mmol/L   Chloride 102 96 - 106 mmol/L   CO2 19 (L) 20 - 29 mmol/L   Calcium 10.6 (H) 8.7 - 10.3 mg/dL   Total Protein 6.9 6.0 - 8.5 g/dL   Albumin 4.6  3.7 - 4.7 g/dL   Globulin, Total 2.3 1.5 - 4.5 g/dL   Albumin/Globulin Ratio 2.0 1.2 - 2.2   Bilirubin Total 0.6 0.0 - 1.2 mg/dL   Alkaline Phosphatase 92 39 - 117 IU/L   AST 13 0 - 40 IU/L   ALT 13 0 - 32 IU/L      Assessment & Plan:   Problem List Items Addressed This Visit      Cardiovascular and Mediastinum   Essential hypertension, benign    Chronic, ongoing with majority at home BP readings at goal for age. Continue current medication regimen and adjust as needed + collaboration with cardiology.  CMP in 4 weeks while in office for visit.        Respiratory   OSA (obstructive sleep apnea)    Continue 100% use of CPAP at night.        Other   Lymphedema    Chronic, ongoing.  Continue collaboration with vascular and lymph pump + massage therapy.      Depression - Primary    Chronic, stable.  Continue current medication regimen and adjust as needed based on mood.  Denies SI/HI.        Hyperlipidemia    Chronic, ongoing.  Continue current medication regimen and adjust as needed.  Lipid panel next visit.  Return in 4 weeks for face to face visit.      Insomnia    Chronic, ongoing.  Benefit from Trazodone at night as needed.  Continue current medication regimen and adjust as needed.  Continue focus on sleep hygiene.      BMI 50.0-59.9, adult ([place])    Recommend continued focus on healthy diet choices and regular physical activity (30 minutes 5 days a week).  Praised for recent weight loss.         I discussed the  assessment and treatment plan with the patient. The patient was provided an opportunity to ask questions and all were answered. The patient agreed with the plan and demonstrated an understanding of the instructions.   The patient was advised to call back or seek an in-person evaluation if the symptoms worsen or if the condition fails to improve as anticipated.   I provided 15 minutes of time during this encounter.  Follow up plan: Return in about 4 weeks (around 08/06/2019) for HTN/HLD, and knee pain (in office with labs).

## 2019-07-09 NOTE — Assessment & Plan Note (Signed)
Continue 100% use of CPAP at night. 

## 2019-07-12 ENCOUNTER — Encounter: Payer: Self-pay | Admitting: Nurse Practitioner

## 2019-07-12 NOTE — Progress Notes (Signed)
Called pt to schedule, no answer, left vm. Sending mychart message

## 2019-07-19 ENCOUNTER — Ambulatory Visit: Payer: Medicare Other

## 2019-07-19 NOTE — Chronic Care Management (AMB) (Signed)
  Care Management   Follow Up Note   07/19/2019 Name: ANNIKAH LOVINS MRN: [id number] DOB: 1940-12-31  Referred by: Marjie Skiff, NP Reason for referral : Care Coordination   DAMIYA SANDEFUR is a 79 y.o. year old female who is a primary care patient of Cannady, Dorie Rank, NP. The care management team was consulted for assistance with care management and care coordination needs.    Review of patient status, including review of consultants reports, relevant laboratory and other test results, and collaboration with appropriate care team members and the patient's provider was performed as part of comprehensive patient evaluation and provision of chronic care management services.    LCSW completed CCM outreach attempt today but was unable to reach patient successfully. A HIPPA compliant voice message was left encouraging patient to return call once available. LCSW rescheduled CCM SW appointment as well.  A HIPPA compliant phone message was left for the patient providing contact information and requesting a return call.   Dickie La, BSW, MSW, LCSW [facility] Care Management   [email or web address] Phone: [phone]

## 2019-08-25 ENCOUNTER — Encounter: Payer: Self-pay | Admitting: Nurse Practitioner

## 2019-08-25 ENCOUNTER — Ambulatory Visit (INDEPENDENT_AMBULATORY_CARE_PROVIDER_SITE_OTHER): Payer: BC Managed Care – PPO | Admitting: Nurse Practitioner

## 2019-08-25 ENCOUNTER — Other Ambulatory Visit: Payer: Self-pay

## 2019-08-25 VITALS — BP 124/69 | HR 56 | Temp 98.1°F

## 2019-08-25 DIAGNOSIS — G4733 Obstructive sleep apnea (adult) (pediatric): Secondary | ICD-10-CM | POA: Diagnosis not present

## 2019-08-25 DIAGNOSIS — F3340 Major depressive disorder, recurrent, in remission, unspecified: Secondary | ICD-10-CM

## 2019-08-25 DIAGNOSIS — I10 Essential (primary) hypertension: Secondary | ICD-10-CM

## 2019-08-25 DIAGNOSIS — L989 Disorder of the skin and subcutaneous tissue, unspecified: Secondary | ICD-10-CM | POA: Diagnosis not present

## 2019-08-25 DIAGNOSIS — R5383 Other fatigue: Secondary | ICD-10-CM | POA: Diagnosis not present

## 2019-08-25 DIAGNOSIS — L309 Dermatitis, unspecified: Secondary | ICD-10-CM | POA: Insufficient documentation

## 2019-08-25 DIAGNOSIS — I890 Lymphedema, not elsewhere classified: Secondary | ICD-10-CM | POA: Diagnosis not present

## 2019-08-25 DIAGNOSIS — E538 Deficiency of other specified B group vitamins: Secondary | ICD-10-CM

## 2019-08-25 DIAGNOSIS — E782 Mixed hyperlipidemia: Secondary | ICD-10-CM

## 2019-08-25 LAB — BAYER DCA HB A1C WAIVED: HB A1C (BAYER DCA - WAIVED): 5.1 % (ref <7.0)

## 2019-08-25 MED ORDER — TRIAMCINOLONE ACETONIDE 0.1 % EX CREA: TOPICAL_CREAM | CUTANEOUS | 5 refills | Status: DC

## 2019-08-25 NOTE — Assessment & Plan Note (Signed)
Ongoing, continue daily supplement and recheck B12 level today. 

## 2019-08-25 NOTE — Assessment & Plan Note (Signed)
Chronic, ongoing.  Continue current medication regimen and adjust as needed. Lipid panel today. 

## 2019-08-25 NOTE — Assessment & Plan Note (Signed)
Chronic, ongoing.  Continue collaboration with vascular and lymph pump + massage therapy.

## 2019-08-25 NOTE — Assessment & Plan Note (Signed)
To right anterior chest.  Suspect seborrheic keratosis.  Has had no changes in size or color.  Continue to monitor area and if changes present obtain biopsy or send to dermatology for further evaluation.  Patient agrees with this plan and will monitor.

## 2019-08-25 NOTE — Patient Instructions (Signed)

## 2019-08-25 NOTE — Assessment & Plan Note (Signed)
Chronic, stable.  Continue current medication regimen and adjust as needed based on mood.  Denies SI/HI.

## 2019-08-25 NOTE — Assessment & Plan Note (Addendum)
Chronic, ongoing with BP at goal today. Continue current medication regimen and adjust as needed + collaboration with cardiology.  Recommend she monitor BP at home at least a few mornings a week at home.  CMP today.  Return in 3 months.

## 2019-08-25 NOTE — Progress Notes (Signed)
BP 124/69   Pulse (!) 56   Temp 98.1 F (36.7 C) (Oral)   LMP  (LMP Unknown)   SpO2 97%    Subjective:    Patient ID: [name], female    DOB: 1940-05-23, 79 y.o.   MRN: [id number]  HPI: [name] is a 79 y.o. female  Chief Complaint  Patient presents with  . Hyperlipidemia  . Hypertension   DEPRESSION & INSOMNIA Continues on Wellbutrin 150 MG BID and Trazodone 50 MG at bedtime as needed, when takes this will sleep 10 hours.  Uses CPAP 100% of the time at night. Mood status: stable Satisfied with current treatment?: yes Symptom severity: mild  Duration of current treatment : chronic Side effects: no Medication compliance: good compliance Psychotherapy/counseling: none Depressed mood: no Anxious mood: no Anhedonia: no Significant weight loss or gain: no Insomnia: none -- takes Trazodone once or twice a week Fatigue: no Feelings of worthlessness or guilt: no Impaired concentration/indecisiveness: no Suicidal ideations: no Hopelessness: no Crying spells: no Depression screen [facility] 2/9 07/09/2019 04/12/2019 12/22/2018 03/09/2018 03/09/2018  Decreased Interest 0 0 0 0 0  Down, Depressed, Hopeless 0 0 0 0 0  PHQ - 2 Score 0 0 0 0 0  Altered sleeping 0 - 0 1 1  Tired, decreased energy 0 - 2 2 2   Change in appetite 0 - 0 0 0  Feeling bad or failure about yourself  0 - 0 0 0  Trouble concentrating 0 - 0 0 0  Moving slowly or fidgety/restless 0 - 0 0 0  Suicidal thoughts 0 - 0 0 0  PHQ-9 Score 0 - 2 3 3   Difficult doing work/chores Not difficult at all - Not difficult at all - -  Some recent data might be hidden   HYPERTENSION / HYPERLIPIDEMIA Continues on Furosemide 40 MG PRN, ASA, Avapro 300 MG daily, Bystolic 5 MG daily, and Atorvastatin 10 MG daily.  Saw Dr. on 02/17/2019 and he has recommended she go to vascular for follow-up on her lymphedema, she has returned to massage therapy. She saw Dr. Mariah Milling on 06/07/2019 and lymph pump added to improve control of  lymphedema.  Could not get the lymph pump until the 25th of March because of Medicare, but still has not heard from them.  Continues to report fatigue, is taking B12 supplement daily for past low levels and would like this checked today. Satisfied with current treatment? yes Duration of hypertension: chronic BP monitoring frequency: not checking BP range: not checking recently BP medication side effects: no Duration of hyperlipidemia: chronic Cholesterol medication side effects: no Cholesterol supplements: none Medication compliance: good compliance Aspirin: yes Recent stressors: no Recurrent headaches: no Visual changes: no Palpitations: no Dyspnea: no Chest pain: no Lower extremity edema: no Dizzy/lightheaded: no   SKIN LESION Noticed to right elbow, left leg, and posterior left shoulder with white, patchy areas that when peel off are shiny underneath.  Then one area to anterior right chest several months, has not changed in color and size.   Duration: weeks Location:  Painful: no Itching: no Onset: gradual Context: not changing Associated signs and symptoms:  History of skin cancer: no History of precancerous skin lesions: no Family history of skin cancer: no  Relevant past medical, surgical, family and social history reviewed and updated as indicated. Interim medical history since our last visit reviewed. Allergies and medications reviewed and updated.  Review of Systems  Constitutional: Positive for fatigue. Negative for activity  change, appetite change, diaphoresis and fever.  Respiratory: Negative for cough, chest tightness, shortness of breath and wheezing.   Cardiovascular: Positive for leg swelling (baseline). Negative for chest pain and palpitations.  Gastrointestinal: Negative.   Endocrine: Negative.   Neurological: Negative.   Psychiatric/Behavioral: Negative.     Per HPI unless specifically indicated above     Objective:    BP 124/69   Pulse (!) 56    Temp 98.1 F (36.7 C) (Oral)   LMP  (LMP Unknown)   SpO2 97%   Wt Readings from Last 3 Encounters:  06/07/19 (!) 333 lb (151 kg)  06/08/18 (!) 357 lb (161.9 kg)  03/09/18 (!) 353 lb (160.1 kg)    Physical Exam Vitals and nursing note reviewed.  Constitutional:      General: She is awake. She is not in acute distress.    Appearance: She is well-developed. She is morbidly obese. She is not ill-appearing.  HENT:     Head: Normocephalic.     Right Ear: Hearing normal.     Left Ear: Hearing normal.  Eyes:     General: Lids are normal.        Right eye: No discharge.        Left eye: No discharge.     Conjunctiva/sclera: Conjunctivae normal.     Pupils: Pupils are equal, round, and reactive to light.  Neck:     Thyroid: No thyromegaly.     Vascular: No carotid bruit.  Cardiovascular:     Rate and Rhythm: Normal rate and regular rhythm.     Heart sounds: Normal heart sounds. No murmur. No gallop.   Pulmonary:     Effort: Pulmonary effort is normal. No accessory muscle usage or respiratory distress.     Breath sounds: Normal breath sounds.  Abdominal:     General: Bowel sounds are normal.     Palpations: Abdomen is soft. There is no hepatomegaly or splenomegaly.  Musculoskeletal:     Cervical back: Normal range of motion and neck supple.     Right lower leg: 2+ Edema present.     Left lower leg: 2+ Edema present.  Skin:    General: Skin is warm and dry.          Comments: Small round, 2 cm, patch with whitish scaling to upper left shin.  Skin intact with no drainage.  Similar area to right elbow.  Neurological:     Mental Status: She is alert and oriented to person, place, and time.  Psychiatric:        Attention and Perception: Attention normal.        Mood and Affect: Mood normal.        Speech: Speech normal.        Behavior: Behavior normal. Behavior is cooperative.        Thought Content: Thought content normal.     Results for orders placed or performed in visit  on 08/25/19  Bayer DCA Hb A1c Waived  Result Value Ref Range   HB A1C (BAYER DCA - WAIVED) 5.1 <7.0 %      Assessment & Plan:   Problem List Items Addressed This Visit      Cardiovascular and Mediastinum   Essential hypertension, benign    Chronic, ongoing with BP at goal today. Continue current medication regimen and adjust as needed + collaboration with cardiology.  Recommend she monitor BP at home at least a few mornings a week at home.  CMP  today.  Return in 3 months.        Respiratory   OSA (obstructive sleep apnea)    Continue 100% use of CPAP at night.        Musculoskeletal and Integument   Dermatitis    Noted to elbow right and left lower leg, has used Triamcinolone in past with benefit.  Will refill this. Plan on referral to dermatology in future if worsening or ongoing.      Skin lesion    To right anterior chest.  Suspect seborrheic keratosis.  Has had no changes in size or color.  Continue to monitor area and if changes present obtain biopsy or send to dermatology for further evaluation.  Patient agrees with this plan and will monitor.        Other   Lymphedema    Chronic, ongoing.  Continue collaboration with vascular and lymph pump + massage therapy.      Depression - Primary    Chronic, stable.  Continue current medication regimen and adjust as needed based on mood.  Denies SI/HI.        Hyperlipidemia    Chronic, ongoing.  Continue current medication regimen and adjust as needed.  Lipid panel today.      Relevant Orders   Lipid Panel w/o Chol/HDL Ratio   Vitamin B12 deficiency    Ongoing, continue daily supplement and recheck B12 level today.      Relevant Orders   B12    Other Visit Diagnoses    Fatigue, unspecified type       Recheck CBC, B12, A1C, and thyroid today per patient request.  Labs ordered in October, but she had not attained yet.       Follow up plan: Return in about 3 months (around 11/24/2019) for HTN/HLD, Thyroid, OSA.

## 2019-08-25 NOTE — Assessment & Plan Note (Signed)
Noted to elbow right and left lower leg, has used Triamcinolone in past with benefit.  Will refill this. Plan on referral to dermatology in future if worsening or ongoing.

## 2019-08-25 NOTE — Assessment & Plan Note (Signed)
Continue 100% use of CPAP at night. 

## 2019-08-26 LAB — CBC WITH DIFFERENTIAL/PLATELET
Basophils Absolute: 0 10*3/uL (ref 0.0–0.2)
Basos: 0 %
EOS (ABSOLUTE): 0 10*3/uL (ref 0.0–0.4)
Eos: 0 %
Hematocrit: 41 % (ref 34.0–46.6)
Hemoglobin: 13.9 g/dL (ref 11.1–15.9)
Immature Grans (Abs): 0 10*3/uL (ref 0.0–0.1)
Immature Granulocytes: 0 %
Lymphocytes Absolute: 1.6 10*3/uL (ref 0.7–3.1)
Lymphs: 30 %
MCH: 31.4 pg (ref 26.6–33.0)
MCHC: 33.9 g/dL (ref 31.5–35.7)
MCV: 93 fL (ref 79–97)
Monocytes Absolute: 0.5 10*3/uL (ref 0.1–0.9)
Monocytes: 9 %
Neutrophils Absolute: 3.3 10*3/uL (ref 1.4–7.0)
Neutrophils: 61 %
Platelets: 275 10*3/uL (ref 150–450)
RBC: 4.43 x10E6/uL (ref 3.77–5.28)
RDW: 13.3 % (ref 11.7–15.4)
WBC: 5.4 10*3/uL (ref 3.4–10.8)

## 2019-08-26 LAB — COMPREHENSIVE METABOLIC PANEL
ALT: 12 IU/L (ref 0–32)
AST: 17 IU/L (ref 0–40)
Albumin/Globulin Ratio: 1.9 (ref 1.2–2.2)
Albumin: 4.3 g/dL (ref 3.7–4.7)
Alkaline Phosphatase: 91 IU/L (ref 39–117)
BUN/Creatinine Ratio: 12 (ref 12–28)
BUN: 10 mg/dL (ref 8–27)
Bilirubin Total: 0.6 mg/dL (ref 0.0–1.2)
CO2: 21 mmol/L (ref 20–29)
Calcium: 10.6 mg/dL — ABNORMAL HIGH (ref 8.7–10.3)
Chloride: 105 mmol/L (ref 96–106)
Creatinine, Ser: 0.84 mg/dL (ref 0.57–1.00)
GFR calc Af Amer: 76 mL/min/{1.73_m2} (ref >59)
GFR calc non Af Amer: 66 mL/min/{1.73_m2} (ref >59)
Globulin, Total: 2.3 g/dL (ref 1.5–4.5)
Glucose: 91 mg/dL (ref 65–99)
Potassium: 4.6 mmol/L (ref 3.5–5.2)
Sodium: 139 mmol/L (ref 134–144)
Total Protein: 6.6 g/dL (ref 6.0–8.5)

## 2019-08-26 LAB — THYROID PANEL WITH TSH
Free Thyroxine Index: 2.2 (ref 1.2–4.9)
T3 Uptake Ratio: 24 % (ref 24–39)
T4, Total: 9.1 ug/dL (ref 4.5–12.0)
TSH: 3.65 u[IU]/mL (ref 0.450–4.500)

## 2019-08-26 LAB — LIPID PANEL W/O CHOL/HDL RATIO
Cholesterol, Total: 185 mg/dL (ref 100–199)
HDL: 89 mg/dL (ref >39)
LDL Chol Calc (NIH): 81 mg/dL (ref 0–99)
Triglycerides: 84 mg/dL (ref 0–149)
VLDL Cholesterol Cal: 15 mg/dL (ref 5–40)

## 2019-08-26 LAB — VITAMIN B12: Vitamin B-12: 518 pg/mL (ref 232–1245)

## 2019-08-26 NOTE — Progress Notes (Signed)
Contacted via MyChart

## 2019-08-30 ENCOUNTER — Telehealth: Payer: Self-pay | Admitting: Nurse Practitioner

## 2019-08-30 NOTE — Telephone Encounter (Signed)
Copied from CRM [id number]. Topic: Quick Communication - Lab Results (Clinic Use ONLY) >> Aug 30, 2019 12:05 PM Leafy Ro wrote: Pt is calling and would like lab results and a copy mail to home address

## 2019-08-30 NOTE — Telephone Encounter (Signed)
Patient notified of results. Results printed and mailed to patient.

## 2019-09-02 ENCOUNTER — Other Ambulatory Visit: Payer: Self-pay | Admitting: Nurse Practitioner

## 2019-09-02 NOTE — Telephone Encounter (Signed)
Requested medication (s) are due for refill today:yes  Requested medication (s) are on the active medication list: yes  Last refill:  historic   Future visit scheduled: yes  Notes to clinic:  historic medication and provider    Requested Prescriptions  Pending Prescriptions Disp Refills   BYSTOLIC 5 MG tablet [Pharmacy Med Name: BYSTOLIC 5MG  TABLETS] 30 tablet     Sig: TAKE 1/2 TABLET(2.5 MG) BY MOUTH DAILY      Cardiovascular:  Beta Blockers Passed - 09/02/2019  2:44 PM      Passed - Last BP in normal range    BP Readings from Last 1 Encounters:  08/25/19 124/69          Passed - Last Heart Rate in normal range    Pulse Readings from Last 1 Encounters:  08/25/19 (!) 56          Passed - Valid encounter within last 6 months    Recent Outpatient Visits           1 week ago Recurrent major depressive disorder, in remission (HCC)   [facility] [place], [name] T, NP   1 month ago Recurrent major depressive disorder, in remission (HCC)   [facility] Cannady, [name] T, NP   5 months ago Essential hypertension, benign   [facility] [place], [place] T, NP   8 months ago Recurrent major depressive disorder, in remission (HCC)   [facility] Cannady, [name] T, NP   9 months ago Essential hypertension, benign   [facility] [place], [place], NP       Future Appointments             In 7 months Dorie Rank, [facility]

## 2019-09-09 ENCOUNTER — Telehealth (INDEPENDENT_AMBULATORY_CARE_PROVIDER_SITE_OTHER): Payer: Self-pay

## 2019-09-09 NOTE — Telephone Encounter (Signed)
She needs to contact the company directly as we do not control shipment of the pump.  We only facilitate the referral.

## 2019-09-09 NOTE — Telephone Encounter (Signed)
I spoke with the pt and made her aware of the NP instructions below.

## 2019-09-09 NOTE — Telephone Encounter (Signed)
The pt called wanting to  Know when she would be getting he lymph pumps she has already been fitted this is the note from he appointment in January. ASSESSMENT AND PLAN:  1. Lymphedema Patient recently had her fitting for a lymphedema pump and it should arrive to her soon.  We discussed [place] wraps for compression.  Patient was given a prescription today.  We also discussed wound wraps however the patient had those previously and she was unable to tolerate.  Patient is advised to continue with conservative therapy and we will have her return to the office in 4 months to reevaluate her progression with the lymphedema pump.  Patient is advised to contact her office sooner if there are issues with swelling that are unable to be controlled with conservative therapy

## 2019-09-27 ENCOUNTER — Ambulatory Visit: Payer: Medicare Other

## 2019-09-27 NOTE — Chronic Care Management (AMB) (Signed)
  Care Management   Follow Up Note   09/27/2019 Name: [name] MRN: [id number] DOB: 11/12/1940  Referred by: Marjie Skiff, NP Reason for referral : Care Coordination   [name] is a 79 y.o. year old female who is a primary care patient of Cannady, Dorie Rank, NP. The care management team was consulted for assistance with care management and care coordination needs.    Review of patient status, including review of consultants reports, relevant laboratory and other test results, and collaboration with appropriate care team members and the patient's provider was performed as part of comprehensive patient evaluation and provision of chronic care management services.    LCSW completed CCM outreach attempt today but was unable to reach patient successfully. A HIPPA compliant voice message was left encouraging patient to return call once available. LCSW rescheduled CCM SW appointment as well.  A HIPPA compliant phone message was left for the patient providing contact information and requesting a return call.   Dickie La, BSW, MSW, LCSW [facility] Care Management [place]  [facility] [email or web address] Phone: [phone]

## 2019-10-15 ENCOUNTER — Ambulatory Visit (INDEPENDENT_AMBULATORY_CARE_PROVIDER_SITE_OTHER): Payer: BC Managed Care – PPO | Admitting: Nurse Practitioner

## 2019-11-15 ENCOUNTER — Other Ambulatory Visit: Payer: Self-pay

## 2019-11-15 ENCOUNTER — Other Ambulatory Visit: Payer: Self-pay | Admitting: Nurse Practitioner

## 2019-11-15 ENCOUNTER — Other Ambulatory Visit: Payer: Self-pay | Admitting: Internal Medicine

## 2019-11-15 DIAGNOSIS — J4520 Mild intermittent asthma, uncomplicated: Secondary | ICD-10-CM

## 2019-11-15 MED ORDER — SPIRIVA RESPIMAT 1.25 MCG/ACT IN AERS: 2.0000 | INHALATION_SPRAY | Freq: Every day | RESPIRATORY_TRACT | 0 refills | Status: DC

## 2019-12-09 ENCOUNTER — Ambulatory Visit: Payer: Medicare Other

## 2019-12-22 ENCOUNTER — Ambulatory Visit: Payer: Medicare Other

## 2019-12-27 DIAGNOSIS — H2513 Age-related nuclear cataract, bilateral: Secondary | ICD-10-CM | POA: Diagnosis not present

## 2019-12-29 ENCOUNTER — Other Ambulatory Visit: Payer: Self-pay

## 2019-12-29 ENCOUNTER — Ambulatory Visit: Payer: Medicare Other

## 2019-12-29 ENCOUNTER — Ambulatory Visit
Admission: RE | Admit: 2019-12-29 | Discharge: 2019-12-29 | Disposition: A | Discharge status: Home / Self Care | Payer: Medicare Other | Source: Ambulatory Visit | Attending: Unknown Physician Specialty | Admitting: Unknown Physician Specialty

## 2019-12-29 DIAGNOSIS — E041 Nontoxic single thyroid nodule: Secondary | ICD-10-CM | POA: Diagnosis not present

## 2019-12-29 DIAGNOSIS — E010 Iodine-deficiency related diffuse (endemic) goiter: Secondary | ICD-10-CM | POA: Diagnosis not present

## 2019-12-29 NOTE — Chronic Care Management (AMB) (Signed)
  Care Management   Follow Up Note   12/29/2019 Name: [name] MRN: [id number] DOB: 1940-11-29  Referred by: Marjie Skiff, NP Reason for referral : Care Coordination   [name] is a 79 y.o. year old female who is a primary care patient of Cannady, Dorie Rank, NP. The care management team was consulted for assistance with care management and care coordination needs.    Review of patient status, including review of consultants reports, relevant laboratory and other test results, and collaboration with appropriate care team members and the patient's provider was performed as part of comprehensive patient evaluation and provision of chronic care management services.    LCSW completed CCM outreach attempt today but was unable to reach patient successfully. A HIPPA compliant voice message was left encouraging patient to return call once available. LCSW rescheduled CCM SW appointment as well.  A HIPPA compliant phone message was left for the patient providing contact information and requesting a return call.   Dickie La, BSW, MSW, LCSW [facility] Care [facility] [place]  [facility] [email or web address] Phone: [phone]

## 2020-01-03 ENCOUNTER — Other Ambulatory Visit: Payer: Self-pay | Admitting: Nurse Practitioner

## 2020-01-03 DIAGNOSIS — I890 Lymphedema, not elsewhere classified: Secondary | ICD-10-CM

## 2020-01-03 NOTE — Telephone Encounter (Signed)
Requested Prescriptions  Pending Prescriptions Disp Refills  . furosemide (LASIX) 40 MG tablet [Pharmacy Med Name: FUROSEMIDE 40MG  TABLETS] 90 tablet 0    Sig: TAKE 1 TABLET(40 MG) BY MOUTH DAILY AS NEEDED     Cardiovascular:  Diuretics - Loop Failed - 01/03/2020 12:27 PM      Failed - Ca in normal range and within 360 days    Calcium  Date Value Ref Range Status  08/25/2019 10.6 (H) 8.7 - 10.3 mg/dL Final         Passed - K in normal range and within 360 days    Potassium  Date Value Ref Range Status  08/25/2019 4.6 3.5 - 5.2 mmol/L Final         Passed - Na in normal range and within 360 days    Sodium  Date Value Ref Range Status  08/25/2019 139 134 - 144 mmol/L Final         Passed - Cr in normal range and within 360 days    Creatinine, Ser  Date Value Ref Range Status  08/25/2019 0.84 0.57 - 1.00 mg/dL Final         Passed - Last BP in normal range    BP Readings from Last 1 Encounters:  08/25/19 124/69         Passed - Valid encounter within last 6 months    Recent Outpatient Visits          4 months ago Recurrent major depressive disorder, in remission (HCC)   [facility] [place], [name] T, NP   5 months ago Recurrent major depressive disorder, in remission (HCC)   [facility] Cannady, [name] T, NP   9 months ago Essential hypertension, benign   [facility] [place], [place] T, NP   1 year ago Recurrent major depressive disorder, in remission (HCC)   [facility] Cannady, GRUMS, NP   1 year ago Essential hypertension, benign   [facility] [place], [place], NP      Future Appointments            In 3 months [facility], [facility]

## 2020-03-08 ENCOUNTER — Other Ambulatory Visit: Payer: Self-pay | Admitting: Internal Medicine

## 2020-03-08 DIAGNOSIS — J4520 Mild intermittent asthma, uncomplicated: Secondary | ICD-10-CM

## 2020-03-09 ENCOUNTER — Ambulatory Visit: Payer: BC Managed Care – PPO | Admitting: Unknown Physician Specialty

## 2020-03-10 ENCOUNTER — Telehealth: Payer: Medicare Other

## 2020-03-21 ENCOUNTER — Other Ambulatory Visit: Payer: Self-pay | Admitting: Nurse Practitioner

## 2020-03-22 ENCOUNTER — Ambulatory Visit: Payer: BC Managed Care – PPO | Admitting: Licensed Clinical Social Worker

## 2020-03-22 DIAGNOSIS — F4321 Adjustment disorder with depressed mood: Secondary | ICD-10-CM

## 2020-03-22 DIAGNOSIS — F3340 Major depressive disorder, recurrent, in remission, unspecified: Secondary | ICD-10-CM

## 2020-03-22 DIAGNOSIS — R5383 Other fatigue: Secondary | ICD-10-CM

## 2020-03-22 DIAGNOSIS — I10 Essential (primary) hypertension: Secondary | ICD-10-CM

## 2020-03-22 DIAGNOSIS — E782 Mixed hyperlipidemia: Secondary | ICD-10-CM

## 2020-03-22 NOTE — Chronic Care Management (AMB) (Signed)
Chronic Care Management    Clinical Social Work Follow Up Note  03/22/2020 Name: DESSIRE GRIMES MRN: [id number] DOB: [birth year]  [name] Caison is a 79 y.o. year old female who is a primary care patient of Cannady, Dorie Rank, NP. The CCM team was consulted for assistance with Grief Counseling.   Review of patient status, including review of consultants reports, other relevant assessments, and collaboration with appropriate care team members and the patient's provider was performed as part of comprehensive patient evaluation and provision of chronic care management services.    SDOH (Social Determinants of Health) assessments performed: Yes    Outpatient Encounter Medications as of 03/22/2020  Medication Sig  . BYSTOLIC 5 MG tablet TAKE 1/2 FFMBWG(6.6 MG) BY MOUTH DAILY  . albuterol (PROVENTIL) (5 MG/ML) 0.5% nebulizer solution Take 0.5 mLs (2.5 mg total) by nebulization every 6 (six) hours as needed for wheezing or shortness of breath. (Patient not taking: Reported on 04/12/2019)  . Apple Cider Vinegar 500 MG TABS Take 500 mg by mouth 2 (two) times a day.   Marland Kitchen aspirin 81 MG tablet Take 81 mg by mouth daily.  Marland Kitchen atorvastatin (LIPITOR) 10 MG tablet TAKE 1 TABLET(10 MG) BY MOUTH DAILY  . buPROPion (WELLBUTRIN SR) 150 MG 12 hr tablet Take 1 tablet (150 mg total) by mouth 2 (two) times daily.  . Cholecalciferol (VITAMIN D-3 PO) Take 10,000 Units by mouth daily.   . furosemide (LASIX) 40 MG tablet TAKE 1 TABLET(40 MG) BY MOUTH DAILY AS NEEDED  . irbesartan (AVAPRO) 300 MG tablet Take 1 tablet (300 mg total) by mouth daily.  . magnesium oxide (MAG-OX) 400 MG tablet Take 400 mg by mouth 2 (two) times a day.   Marland Kitchen omeprazole (PRILOSEC) 20 MG capsule TAKE 1 CAPSULE(20 MG) BY MOUTH DAILY  . Probiotic Product (PROBIOTIC PO) Take 1 tablet by mouth daily.  . Tiotropium Bromide Monohydrate (SPIRIVA RESPIMAT) 1.25 MCG/ACT AERS Inhale 2 puffs into the lungs daily.  . traZODone (DESYREL) 50 MG tablet Take 1 tablet (50  mg total) by mouth at bedtime as needed for sleep.  Marland Kitchen triamcinolone cream (KENALOG) 0.1 % APPLY TOPICALLY TO THE AFFECTED AREA TWICE DAILY  . vitamin C (ASCORBIC ACID) 500 MG tablet Take 500 mg by mouth daily.  . vitamin E 400 UNIT capsule Take 400 Units by mouth daily.  Monte Fantasia INHUB 250-50 MCG/DOSE AEPB INHALE 1 PUFF INTO THE LUNGS TWICE DAILY   No facility-administered encounter medications on file as of 03/22/2020.     Goals Addressed    .  SW-"I need additional support right now." (pt-stated)        Current Barriers:  . Financial constraints . ADL IADL limitations . Mental Health Symptoms . Social Isolation . Lacks knowledge of community resource: grief support resources within the area  Clinical Social Work Clinical Goal(s):  Marland Kitchen Over the next 120 days, client will work with SW to address concerns related to lack of self-care and ongoing grief over spouse  Interventions: . Patient interviewed and appropriate assessments performed . Provided mental health counseling regarding loss of spouse. Patient denies wanting grief counseling referral at this time. States that she has a large support network and has recently retired. Patient reports spending a lot of time crafting and painting to help cope.  Marland Kitchen LCSW provided emotional support and reflective listening throughout entire session.  . Provided patient with information about [facility] in case she changes her mind. . Patient reports that her  daughter just got her a nice lift chair that massages. She reports that her family is extremely supportive. She moved out of her residence and into her daughter's residence after her spouse passed. The household includes: patient, daughter, son in law and their two children. Patient reports that her grandchild assist her with laundry. She shares that her son in law works from home and is able to assist her whenever needed. . Patient reports that she is actively working on her self-care by  socializing with friends and doing water paint. She reports that she has been paying extra attention to her body. She reports that she has been making a blessings list every night and will re-read them in the morning to provide her with a positive outlook. Positive reinforcement provided.  . Patient shares that her energy levels have been low. Self-care education provided.  . Patient reports no recent falls.  Marland Kitchen LCSW provided patient with Home Care Providers contact number in the case that she wishes to place herself on the wait list for [facility].H.O[facility]R.[facility] services as she feels that she may eventually need an aide.  . Discussed plans with patient for ongoing care management follow up and provided patient with direct contact information for care management team . Advised patient to to contact CCM LCSW if any urgent concerns arise.  . Assisted patient/caregiver with obtaining information about health plan benefits  Patient Self Care Activities:  . Attends all scheduled provider appointments . Calls provider office for new concerns or questions  Please see past updates related to this goal by clicking on the "Past Updates" button in the selected goal      Follow Up Plan: SW will follow up with patient by phone over the next quarter  Dickie La, BSW, MSW, LCSW [facility] [place]  [facility] [email or web address] Phone: [phone]

## 2020-03-23 ENCOUNTER — Encounter: Payer: Self-pay | Admitting: Unknown Physician Specialty

## 2020-03-23 ENCOUNTER — Ambulatory Visit (INDEPENDENT_AMBULATORY_CARE_PROVIDER_SITE_OTHER): Payer: BC Managed Care – PPO | Admitting: Unknown Physician Specialty

## 2020-03-23 VITALS — BP 130/62 | HR 72 | Temp 97.3°F

## 2020-03-23 DIAGNOSIS — I10 Essential (primary) hypertension: Secondary | ICD-10-CM

## 2020-03-23 DIAGNOSIS — J4520 Mild intermittent asthma, uncomplicated: Secondary | ICD-10-CM | POA: Diagnosis not present

## 2020-03-23 DIAGNOSIS — F5101 Primary insomnia: Secondary | ICD-10-CM

## 2020-03-23 DIAGNOSIS — K5900 Constipation, unspecified: Secondary | ICD-10-CM | POA: Diagnosis not present

## 2020-03-23 DIAGNOSIS — I890 Lymphedema, not elsewhere classified: Secondary | ICD-10-CM

## 2020-03-23 DIAGNOSIS — J4540 Moderate persistent asthma, uncomplicated: Secondary | ICD-10-CM

## 2020-03-23 MED ORDER — NEBIVOLOL HCL 5 MG PO TABS
5.0000 mg | ORAL_TABLET | Freq: Every day | ORAL | 1 refills | Status: DC
Start: 2020-03-23 — End: 2020-07-14

## 2020-03-23 MED ORDER — ALBUTEROL SULFATE HFA 108 (90 BASE) MCG/ACT IN AERS: 2.0000 | INHALATION_SPRAY | Freq: Four times a day (QID) | RESPIRATORY_TRACT | 2 refills | Status: DC | PRN

## 2020-03-23 MED ORDER — SPIRIVA RESPIMAT 1.25 MCG/ACT IN AERS: 2.0000 | INHALATION_SPRAY | Freq: Every day | RESPIRATORY_TRACT | 3 refills | Status: DC

## 2020-03-23 NOTE — Assessment & Plan Note (Signed)
Stable,  Continue massages and supplements

## 2020-03-23 NOTE — Assessment & Plan Note (Signed)
Improved with Trazadone 50 mg

## 2020-03-23 NOTE — Progress Notes (Signed)
BP 130/62   Pulse 72   Temp (!) 97.3 F (36.3 C) (Oral)   LMP  (LMP Unknown)   SpO2 97%    Subjective:    Patient ID: [name], female    DOB: 09/21/1940, 79 y.o.   MRN: [id number]   This visit was completed via telephone due to the restrictions of the COVID-19 pandemic. All issues as above were discussed and addressed but no physical exam was performed. If it was felt that the patient should be evaluated in the office, they were directed there. The patient verbally consented to this visit. Patient was unable to complete an audio/visual visit due to Technical difficulties,Lack of internet. . Location of the patient: home . Location of the provider: work . Those involved with this call:  . Provider: Gabriel Cirri, DNP . CMA: Wilhemena Durie, CMA . Front Desk/Registration: Harriet Pho  . Time spent on call: 15 minutes on the phone discussing health concerns. 5 minutes total spent in review of patient's record and preparation of their chart.  I verified patient identity using two factors (patient name and date of birth). Patient consents verbally to being seen via telemedicine visit today.    HPI: [name] is a 79 y.o. female  Chief Complaint  Patient presents with  . Hypertension  . Depression  . Constipation    pt states she is doing bettter    Hypertension Using medications without difficulty.  Needs  Average home BPs 130/52  No problems or lightheadedness No chest pain with exertion or shortness of breath No Edema   Depression/insomnia States her mood is much better with Trazadone.   Depression screen [facility] 2/9 07/09/2019 04/12/2019 12/22/2018 03/09/2018 03/09/2018  Decreased Interest 0 0 0 0 0  Down, Depressed, Hopeless 0 0 0 0 0  PHQ - 2 Score 0 0 0 0 0  Altered sleeping 0 - 0 1 1  Tired, decreased energy 0 - 2 2 2   Change in appetite 0 - 0 0 0  Feeling bad or failure about yourself  0 - 0 0 0  Trouble concentrating 0 - 0 0 0  Moving slowly or  fidgety/restless 0 - 0 0 0  Suicidal thoughts 0 - 0 0 0  PHQ-9 Score 0 - 2 3 3   Difficult doing work/chores Not difficult at all - Not difficult at all - -  Some recent data might be hidden    Constipation Resolved with Colace and my fibrous foods.    Asthma Pt taking Spireva and would like Albuterol inhaler rather than nebulizer  Lymphedema Pt is getting her massages which is helping.  States mucinex has been helpful.    Relevant past medical, surgical, family and social history reviewed and updated as indicated. Interim medical history since our last visit reviewed. Allergies and medications reviewed and updated.  Review of Systems  Constitutional: Negative.   Respiratory: Negative.   Cardiovascular: Negative.   Psychiatric/Behavioral: Negative.     Per HPI unless specifically indicated above     Objective:    BP 130/62   Pulse 72   Temp (!) 97.3 F (36.3 C) (Oral)   LMP  (LMP Unknown)   SpO2 97%   Wt Readings from Last 3 Encounters:  06/07/19 (!) 333 lb (151 kg)  06/08/18 (!) 357 lb (161.9 kg)  03/09/18 (!) 353 lb (160.1 kg)    Physical Exam Neurological:     Mental Status: She is alert.  Psychiatric:  Mood and Affect: Mood normal.       Assessment & Plan:   Problem List Items Addressed This Visit      Unprioritized   Asthma    OK to take Albuterol instead of nebulizer.  Refill Spireva      Relevant Medications   Tiotropium Bromide Monohydrate (SPIRIVA RESPIMAT) 1.25 MCG/ACT AERS   albuterol (VENTOLIN HFA) 108 (90 Base) MCG/ACT inhaler   Essential hypertension, benign    Stable, continue present medications.        Relevant Medications   nebivolol (BYSTOLIC) 5 MG tablet   Insomnia    Improved with Trazadone 50 mg      Lymphedema    Stable,  Continue massages and supplements       Other Visit Diagnoses    Constipation, unspecified constipation type    -  Primary   Improved   Mild intermittent reactive airway disease without  complication       Relevant Medications   Tiotropium Bromide Monohydrate (SPIRIVA RESPIMAT) 1.25 MCG/ACT AERS   albuterol (VENTOLIN HFA) 108 (90 Base) MCG/ACT inhaler       Follow up plan: Return in about 6 months (around 09/20/2020).

## 2020-03-23 NOTE — Assessment & Plan Note (Signed)
Stable, continue present medications.   

## 2020-03-23 NOTE — Assessment & Plan Note (Signed)
OK to take Albuterol instead of nebulizer.  Refill [name]

## 2020-04-17 ENCOUNTER — Ambulatory Visit (INDEPENDENT_AMBULATORY_CARE_PROVIDER_SITE_OTHER): Payer: BC Managed Care – PPO

## 2020-04-17 VITALS — Ht 63.5 in | Wt 333.0 lb

## 2020-04-17 DIAGNOSIS — Z0000 Encounter for general adult medical examination without abnormal findings: Secondary | ICD-10-CM

## 2020-04-17 NOTE — Patient Instructions (Signed)
[name] , Thank you for taking time to come for your Medicare Wellness Visit. I appreciate your ongoing commitment to your health goals. Please review the following plan we discussed and let me know if I can assist you in the future.   Screening recommendations/referrals: Colonoscopy: not required  Mammogram: not required Bone Density: completed 11/29/2008 Recommended yearly ophthalmology/optometry visit for glaucoma screening and checkup Recommended yearly dental visit for hygiene and checkup  Vaccinations: Influenza vaccine: decline Pneumococcal vaccine: completed 08/31/2013 Tdap vaccine: completed 08/31/2013, due 09/01/2023 Shingles vaccine: discussed   Covid-19: decline  Advanced directives: Advance directive discussed with you today.    Conditions/risks identified: none  Next appointment: Follow up in one year for your annual wellness visit    Preventive Care 65 Years and Older, Female Preventive care refers to lifestyle choices and visits with your health care provider that can promote health and wellness. What does preventive care include?  A yearly physical exam. This is also called an annual well check.  Dental exams once or twice a year.  Routine eye exams. Ask your health care provider how often you should have your eyes checked.  Personal lifestyle choices, including:  Daily care of your teeth and gums.  Regular physical activity.  Eating a healthy diet.  Avoiding tobacco and drug use.  Limiting alcohol use.  Practicing safe sex.  Taking low-dose aspirin every day.  Taking vitamin and mineral supplements as recommended by your health care provider. What happens during an annual well check? The services and screenings done by your health care provider during your annual well check will depend on your age, overall health, lifestyle risk factors, and family history of disease. Counseling  Your health care provider may ask you questions about  your:  Alcohol use.  Tobacco use.  Drug use.  Emotional well-being.  Home and relationship well-being.  Sexual activity.  Eating habits.  History of falls.  Memory and ability to understand (cognition).  Work and work Astronomer.  Reproductive health. Screening  You may have the following tests or measurements:  Height, weight, and BMI.  Blood pressure.  Lipid and cholesterol levels. These may be checked every 5 years, or more frequently if you are over 33 years old.  Skin check.  Lung cancer screening. You may have this screening every year starting at age 12 if you have a 30-pack-year history of smoking and currently smoke or have quit within the past 15 years.  Fecal occult blood test (FOBT) of the stool. You may have this test every year starting at age 35.  Flexible sigmoidoscopy or colonoscopy. You may have a sigmoidoscopy every 5 years or a colonoscopy every 10 years starting at age 20.  Hepatitis C blood test.  Hepatitis B blood test.  Sexually transmitted disease (STD) testing.  Diabetes screening. This is done by checking your blood sugar (glucose) after you have not eaten for a while (fasting). You may have this done every 1-3 years.  Bone density scan. This is done to screen for osteoporosis. You may have this done starting at age 29.  Mammogram. This may be done every 1-2 years. Talk to your health care provider about how often you should have regular mammograms. Talk with your health care provider about your test results, treatment options, and if necessary, the need for more tests. Vaccines  Your health care provider may recommend certain vaccines, such as:  Influenza vaccine. This is recommended every year.  Tetanus, diphtheria, and acellular pertussis (Tdap, Td)  vaccine. You may need a Td booster every 10 years.  Zoster vaccine. You may need this after age 58.  Pneumococcal 13-valent conjugate (PCV13) vaccine. One dose is recommended  after age 34.  Pneumococcal polysaccharide (PPSV23) vaccine. One dose is recommended after age 22. Talk to your health care provider about which screenings and vaccines you need and how often you need them. This information is not intended to replace advice given to you by your health care provider. Make sure you discuss any questions you have with your health care provider. Document Released: 06/02/2015 Document Revised: 01/24/2016 Document Reviewed: 03/07/2015 Elsevier Interactive Patient Education  2017 [place] Prevention in the Home Falls can cause injuries. They can happen to people of all ages. There are many things you can do to make your home safe and to help prevent falls. What can I do on the outside of my home?  Regularly fix the edges of walkways and driveways and fix any cracks.  Remove anything that might make you trip as you walk through a door, such as a raised step or threshold.  Trim any bushes or trees on the path to your home.  Use bright outdoor lighting.  Clear any walking paths of anything that might make someone trip, such as rocks or tools.  Regularly check to see if handrails are loose or broken. Make sure that both sides of any steps have handrails.  Any raised decks and porches should have guardrails on the edges.  Have any leaves, snow, or ice cleared regularly.  Use sand or salt on walking paths during winter.  Clean up any spills in your garage right away. This includes oil or grease spills. What can I do in the bathroom?  Use night lights.  Install grab bars by the toilet and in the tub and shower. Do not use towel bars as grab bars.  Use non-skid mats or decals in the tub or shower.  If you need to sit down in the shower, use a plastic, non-slip stool.  Keep the floor dry. Clean up any water that spills on the floor as soon as it happens.  Remove soap buildup in the tub or shower regularly.  Attach bath mats securely with  double-sided non-slip rug tape.  Do not have throw rugs and other things on the floor that can make you trip. What can I do in the bedroom?  Use night lights.  Make sure that you have a light by your bed that is easy to reach.  Do not use any sheets or blankets that are too big for your bed. They should not hang down onto the floor.  Have a firm chair that has side arms. You can use this for support while you get dressed.  Do not have throw rugs and other things on the floor that can make you trip. What can I do in the kitchen?  Clean up any spills right away.  Avoid walking on wet floors.  Keep items that you use a lot in easy-to-reach places.  If you need to reach something above you, use a strong step stool that has a grab bar.  Keep electrical cords out of the way.  Do not use floor polish or wax that makes floors slippery. If you must use wax, use non-skid floor wax.  Do not have throw rugs and other things on the floor that can make you trip. What can I do with my stairs?  Do not leave  any items on the stairs.  Make sure that there are handrails on both sides of the stairs and use them. Fix handrails that are broken or loose. Make sure that handrails are as long as the stairways.  Check any carpeting to make sure that it is firmly attached to the stairs. Fix any carpet that is loose or worn.  Avoid having throw rugs at the top or bottom of the stairs. If you do have throw rugs, attach them to the floor with carpet tape.  Make sure that you have a light switch at the top of the stairs and the bottom of the stairs. If you do not have them, ask someone to add them for you. What else can I do to help prevent falls?  Wear shoes that:  Do not have high heels.  Have rubber bottoms.  Are comfortable and fit you well.  Are closed at the toe. Do not wear sandals.  If you use a stepladder:  Make sure that it is fully opened. Do not climb a closed stepladder.  Make  sure that both sides of the stepladder are locked into place.  Ask someone to hold it for you, if possible.  Clearly mark and make sure that you can see:  Any grab bars or handrails.  First and last steps.  Where the edge of each step is.  Use tools that help you move around (mobility aids) if they are needed. These include:  Canes.  Walkers.  Scooters.  Crutches.  Turn on the lights when you go into a dark area. Replace any light bulbs as soon as they burn out.  Set up your furniture so you have a clear path. Avoid moving your furniture around.  If any of your floors are uneven, fix them.  If there are any pets around you, be aware of where they are.  Review your medicines with your doctor. Some medicines can make you feel dizzy. This can increase your chance of falling. Ask your doctor what other things that you can do to help prevent falls. This information is not intended to replace advice given to you by your health care provider. Make sure you discuss any questions you have with your health care provider. Document Released: 03/02/2009 Document Revised: 10/12/2015 Document Reviewed: 06/10/2014 Elsevier Interactive Patient Education  2017 [employer].

## 2020-04-17 NOTE — Progress Notes (Signed)
I connected with [name] today by telephone and verified that I am speaking with the correct person using two identifiers. Location patient: home Location provider: work Persons participating in the virtual visit: [name], [name] LPN.   I discussed the limitations, risks, security and privacy concerns of performing an evaluation and management service by telephone and the availability of in person appointments. I also discussed with the patient that there may be a patient responsible charge related to this service. The patient expressed understanding and verbally consented to this telephonic visit.    Interactive audio and video telecommunications were attempted between this provider and patient, however failed, due to patient having technical difficulties OR patient did not have access to video capability.  We continued and completed visit with audio only.     Vital signs may be patient reported or missing.  Subjective:   [name] is a 79 y.o. female who presents for Medicare Annual (Subsequent) preventive examination.  Review of Systems     Cardiac Risk Factors include: advanced age (>94men, >80 women);hypertension;obesity (BMI >30kg/m2);sedentary lifestyle     Objective:    Today's Vitals   04/17/20 1426 04/17/20 1428  Weight: (!) 333 lb (151 kg)   Height: 5' 3.5" (1.613 m)   PainSc:  4    Body mass index is 58.06 kg/m.  Advanced Directives 04/17/2020 04/12/2019 01/14/2018 01/01/2018 12/04/2016 11/08/2016  Does Patient Have a Medical Advance Directive? No No No No No No  Would patient like information on creating a medical advance directive? - - - Yes (MAU/Ambulatory/Procedural Areas - Information given) No - Patient declined Yes (MAU/Ambulatory/Procedural Areas - Information given)    Current Medications (verified) Outpatient Encounter Medications as of 04/17/2020  Medication Sig  . albuterol (VENTOLIN HFA) 108 (90 Base) MCG/ACT inhaler Inhale 2 puffs into the  lungs every 6 (six) hours as needed for wheezing or shortness of breath.  . Apple Cider Vinegar 500 MG TABS Take 500 mg by mouth 2 (two) times a day.   Marland Kitchen aspirin 81 MG tablet Take 81 mg by mouth daily.  Marland Kitchen atorvastatin (LIPITOR) 10 MG tablet TAKE 1 TABLET(10 MG) BY MOUTH DAILY  . buPROPion (WELLBUTRIN SR) 150 MG 12 hr tablet Take 1 tablet (150 mg total) by mouth 2 (two) times daily.  . Cholecalciferol (VITAMIN D-3 PO) Take 10,000 Units by mouth daily.   Marland Kitchen docusate sodium (COLACE) 100 MG capsule Take 100 mg by mouth in the morning, at noon, and at bedtime.  . furosemide (LASIX) 40 MG tablet TAKE 1 TABLET(40 MG) BY MOUTH DAILY AS NEEDED  . irbesartan (AVAPRO) 300 MG tablet Take 1 tablet (300 mg total) by mouth daily.  . magnesium oxide (MAG-OX) 400 MG tablet Take 400 mg by mouth 2 (two) times a day.   . Misc Natural Products (NEURIVA PO) Take by mouth.  . nebivolol (BYSTOLIC) 5 MG tablet Take 1 tablet (5 mg total) by mouth daily.  Marland Kitchen omeprazole (PRILOSEC) 20 MG capsule TAKE 1 CAPSULE(20 MG) BY MOUTH DAILY  . Probiotic Product (PROBIOTIC PO) Take 1 tablet by mouth daily.  . Tiotropium Bromide Monohydrate (SPIRIVA RESPIMAT) 1.25 MCG/ACT AERS Inhale 2 puffs into the lungs daily.  . traZODone (DESYREL) 50 MG tablet Take 1 tablet (50 mg total) by mouth at bedtime as needed for sleep.  Marland Kitchen triamcinolone cream (KENALOG) 0.1 % APPLY TOPICALLY TO THE AFFECTED AREA TWICE DAILY  . vitamin C (ASCORBIC ACID) 500 MG tablet Take 500 mg by mouth daily.  Marland Kitchen  vitamin E 400 UNIT capsule Take 400 Units by mouth daily.  Monte Fantasia INHUB 250-50 MCG/DOSE AEPB INHALE 1 PUFF INTO THE LUNGS TWICE DAILY   No facility-administered encounter medications on file as of 04/17/2020.    Allergies (verified) Latex, Benicar [olmesartan], Ciprofloxacin, Diovan [valsartan], Flagyl [metronidazole], Lotensin [benazepril hcl], Penicillins, and Sulfa antibiotics   History: Past Medical History:  Diagnosis Date  . Allergy   . Arthritis     . Asthma   . Chronic fatigue syndrome   . Fractured fibula   . GERD (gastroesophageal reflux disease)   . Hypertension   . Lymphedema 2004  . Neuromuscular disorder (HCC)   . Pneumonia    Past Surgical History:  Procedure Laterality Date  . ABDOMINAL HYSTERECTOMY  1974  . BIOPSY THYROID    . CHOLECYSTECTOMY  2005   Family History  Problem Relation Age of Onset  . Heart disease Mother   . Hyperlipidemia Mother   . Diabetes Father   . Alcoholism Father   . Cancer Brother   . Hypertension Son   . Stroke Maternal Grandmother    Social History   Socioeconomic History  . Marital status: Widowed    Spouse name: Not on file  . Number of children: Not on file  . Years of education: 14 years  . Highest education level: Some college, no degree  Occupational History  . Not on file  Tobacco Use  . Smoking status: Never Smoker  . Smokeless tobacco: Never Used  Vaping Use  . Vaping Use: Never used  Substance and Sexual Activity  . Alcohol use: No    Alcohol/week: 0.0 standard drinks  . Drug use: No  . Sexual activity: Not Currently  Other Topics Concern  . Not on file  Social History Narrative   Card making club.       Working full time at [employer] school    Social Determinants of Health   Financial Resource Strain: Low Risk   . Difficulty of Paying Living Expenses: Not hard at all  Food Insecurity: No Food Insecurity  . Worried About Programme researcher, broadcasting/film/video in the Last Year: Never true  . Ran Out of Food in the Last Year: Never true  Transportation Needs: No Transportation Needs  . Lack of Transportation (Medical): No  . Lack of Transportation (Non-Medical): No  Physical Activity: Inactive  . Days of Exercise per Week: 0 days  . Minutes of Exercise per Session: 0 min  Stress: No Stress Concern Present  . Feeling of Stress : Only a little  Social Connections:   . Frequency of Communication with Friends and Family: Not on file  . Frequency of Social  Gatherings with Friends and Family: Not on file  . Attends Religious Services: Not on file  . Active Member of Clubs or Organizations: Not on file  . Attends Banker Meetings: Not on file  . Marital Status: Not on file    Tobacco Counseling Counseling given: Not Answered   Clinical Intake:  Pre-visit preparation completed: Yes  Pain : 0-10 Pain Score: 4  Pain Type: Chronic pain Pain Location: Knee Pain Orientation: Left, Right Pain Descriptors / Indicators: Throbbing, Aching Pain Onset: More than a month ago Pain Frequency: Constant     Nutritional Status: BMI > 30  Obese Nutritional Risks: None Diabetes: No  How often do you need to have someone help you when you read instructions, pamphlets, or other written materials from your doctor or  pharmacy?: 1 - Never What is the last grade level you completed in school?: associates degree  Diabetic? no  Interpreter Needed?: No  Information entered by :: NAllen LPN   Activities of Daily Living In your present state of health, do you have any difficulty performing the following activities: 04/17/2020  Hearing? Y  Comment sometimes  Vision? Y  Comment has a cataract  Difficulty concentrating or making decisions? Y  Comment always has trouble making decisions  Walking or climbing stairs? Y  Dressing or bathing? N  Doing errands, shopping? Y  Comment daughter brings  Quarry managerreparing Food and eating ? N  Using the Toilet? N  In the past six months, have you accidently leaked urine? Y  Do you have problems with loss of bowel control? N  Managing your Medications? N  Managing your Finances? N  Housekeeping or managing your Housekeeping? N  Some recent data might be hidden    Patient Care Team: Marjie Skiffannady, [name] T, NP as PCP - General (Nurse Practitioner) Erin FullingKasa, Kurian, MD as Consulting Physician (Pulmonary Disease) Antonieta IbaGollan, Timothy J, MD as Consulting Physician (Cardiology) Gustavus BryantJoyce, Brooke L, LCSW as Social Worker  (Licensed Clinical Social Worker) Minor, Theadora RamaJanci S, RN (Inactive) as Case Manager  Indicate any recent Medical Services you may have received from other than [facility] providers in the past year (date may be approximate).     Assessment:   This is a routine wellness examination for Barbara RhodesBetty.  Hearing/Vision screen  Hearing Screening   125Hz  250Hz  500Hz  1000Hz  2000Hz  3000Hz  4000Hz  6000Hz  8000Hz   Right ear:           Left ear:           Vision Screening Comments: Regular eye exams, [facility] Brooke [facility]King, [facility]  Dietary issues and exercise activities discussed: Current Exercise Habits: The patient does not participate in regular exercise at present  Goals    .  "I have a problem with my pulse dropping and I get dizzy" (pt-stated)      Current Barriers:  . Hx bradycardia, HTN, multiple medications changes made to attempt to control BP without dropping HR . Patient is currently taking irbesartan 150 mg QAM, nebivolol 2.5 mg at noon, irbesartan 150 mg in the afternoon, and nebivolol 2.5 mg at bedtime. Most stable BP/HR in a while  o Reports that she is particularly concerned with her HR dropping, as she was with her husband when he had a heart attack and it dropped to the 30s . Per chart review, no specific indication for beta blocker therapy o Last cardiology visit with [name] on 08/12/2016, he noted that she "likely doesn't need beta blocker therapy"  Pharmacist Clinical Goal(s):  Marland Kitchen. Over the next 90 days, patient will work with PharmD and PCP to address needs related to optimal hypertension management  Interventions:  Congratulated patient on maintenance of goal BP/HR with current regimen. Encouraged to continue checking, and to try to not worry as much about lower HR  Patient Self Care Activities:  . Self administers medications as prescribed . Calls pharmacy for medication refills  . Monitor blood pressure 2-3 times weekly and will monitor pulse  Please see past updates related to  this goal by clicking on the "Past Updates" button in the selected goal      .  "I take a lot of medications and I'm not sure I'm using them correctly" (pt-stated)      Current Barriers:  Marland Kitchen. Knowledge Deficits related to appropriate medication  management   . Patient previously taking diphenhydramine every evening to help with leg itching/sleep; she then tried melatonin, but had visual hallucinations . At last primary care provider appointment, trazodone 25 mg QHS PRN sleep was initiated. Patient notes that she took it every night when she was out of town, and continues to take fairly regularly. She feels it has been extremely beneficial, and she notes she is "much less tearful". She wonders if she can take it every night.  Pharmacist Clinical Goal(s):   Marland Kitchen Over the next 90 days, patient will work with PCP and CCM team to improve appropriate use of safe and effective medications  Interventions: . Discussed with patient that daily low dose trazodone is safe, if she is finding it beneficial. Will notify primary care provider Aura Dials to determine how/when she would like to follow up with patient about her mood   Patient Self Care Activities:  . Does not self-administer prescription medications exactly as prescribed   Please see past updates related to this goal by clicking on the "Past Updates" button in the selected goal      .  "My lyphedema is bad, my legs are itchy and swollen" (pt-stated)      Current Barriers:   Patient reports "leg muscle pain" that has gotten worse due to not being about to receive massage therapy. Described as "sore and painful - like working out for hours" and "feels like someone is pulling on my veins." It wakes her up at night and she has to adjust positions in order to sleep.   Patient states that her pain has gotten better since she stopped the B-12 and is ultilizing her furosemide more often   Pharmacist Clinical Goal(s):  Marland Kitchen Over the next 30 days, patient  will work with PCP and CCM team to address needs related to management of lymphedema  Interventions: . Encouraged continued use of furosemide as prescribed and to report her use to PCP at next visit to determine if lab work is warrented  Patient Self Care Activities:  . Patient wears compression hose daily as directed . Patient will continue to work with CCM RN [name] for strategies to reduce pain and improve lymphedema   Please see past updates related to this goal by clicking on the "Past Updates" button in the selected goal      .  I need to take better care of myself (pt-stated)      Current Barriers:  Marland Kitchen Knowledge Deficits related to maintaining optimal health with lymphedema and hypertension . Chronic Disease Management support and education needs related to weight management, lymphedema and hypertension  Nurse Case Manager Clinical Goal(s):  Marland Kitchen Over the next 90 days, patient will work with [facility] to address needs related to Weight Management, Lymphedema, and Hypertension . Over the next 90 days, the patient will demonstrate ongoing self health care management ability as evidenced by decreased weight, and increaseed adherence to self care activities*  Interventions:  . Evaluation of current treatment plan related to hypertension medication regimen and patient's adherence to plan as established by provider. . Provided education to patient re: Lymphedema management, weight management . Reviewed medications with patient and discussed new added meds and med times . Provided patient with verbal educational materials related to lymphedema . Advised patient, providing education and rationale, to check b/p qday and record, calling PCP for findings outside established parameters.   . Discussed weight management strategies and encouraged patient to begin using [name] which she  stated she had lots of success with in the past. . Encouraged patient to use her compression hose which she  admitted to "getting slack on" . Discussed recent feelings of depression related to the loss of her spouse of 57 years and how this has affected her health.   Patient Self Care Activities:  . Currently UNABLE TO independently Manage weight, lymphedema, and hypertension effectively  Initial goal documentation     .  Increase water intake      Recommend drinking at least 6-8 glasses of water a day    .  Patient Stated      04/17/2020, remain as active as she can    .  SW-"I need additional support right now." (pt-stated)      Current Barriers:  . Financial constraints . ADL IADL limitations . Mental Health Symptoms . Social Isolation . Lacks knowledge of community resource: grief support resources within the area  Clinical Social Work Clinical Goal(s):  Marland Kitchen Over the next 120 days, client will work with SW to address concerns related to lack of self-care and ongoing grief over spouse  Interventions: . Patient interviewed and appropriate assessments performed . Provided mental health counseling regarding loss of spouse. Patient denies wanting grief counseling referral at this time. States that she has a large support network and has recently retired. Patient reports spending a lot of time crafting and painting to help cope.  Marland Kitchen LCSW provided emotional support and reflective listening throughout entire session.  . Provided patient with information about [facility] in case she changes her mind. . Patient reports that her daughter just got her a nice lift chair that massages. She reports that her family is extremely supportive. She moved out of her residence and into her daughter's residence after her spouse passed. The household includes: patient, daughter, son in law and their two children. Patient reports that her grandchild assist her with laundry. She shares that her son in law works from home and is able to assist her whenever needed. . Patient reports that she is actively  working on her self-care by socializing with friends and doing water paint. She reports that she has been paying extra attention to her body. She reports that she has been making a blessings list every night and will re-read them in the morning to provide her with a positive outlook. Positive reinforcement provided.  . Patient shares that her energy levels have been low. Self-care education provided.  . Patient reports no recent falls.  Marland Kitchen LCSW provided patient with Home Care Providers contact number in the case that she wishes to place herself on the wait list for [facility].H.O[facility]R.[facility] services as she feels that she may eventually need an aide.  . Discussed plans with patient for ongoing care management follow up and provided patient with direct contact information for care management team . Advised patient to to contact CCM LCSW if any urgent concerns arise.  . Assisted patient/caregiver with obtaining information about health plan benefits  Patient Self Care Activities:  . Attends all scheduled provider appointments . Calls provider office for new concerns or questions  Please see past updates related to this goal by clicking on the "Past Updates" button in the selected goal        Depression Screen PHQ 2/9 Scores 04/17/2020 07/09/2019 04/12/2019 12/22/2018 03/09/2018 03/09/2018 01/01/2018  PHQ - 2 Score 0 0 0 0 0 0 0  PHQ- 9 Score 2 0 - 2 3 3  1  Fall Risk Fall Risk  04/17/2020 04/12/2019 11/11/2018 01/01/2018 07/23/2017  Falls in the past year? 0 1 0 No No  Number falls in past yr: - 1 0 - -  Injury with Fall? - 1 - - -  Risk for fall due to : Impaired mobility;Impaired balance/gait;Medication side effect History of fall(s);Impaired mobility - - -  Follow up Falls evaluation completed;Education provided;Falls prevention discussed - Falls evaluation completed - -    Any stairs in or around the home? No  If so, are there any without handrails? n/a Home free of loose throw rugs in walkways, pet beds,  electrical cords, etc? Yes  Adequate lighting in your home to reduce risk of falls? Yes   ASSISTIVE DEVICES UTILIZED TO PREVENT FALLS:  Life alert? No  Use of a cane, walker or w/c? Yes  Grab bars in the bathroom? Yes  Shower chair or bench in shower? Yes  Elevated toilet seat or a handicapped toilet? Yes   TIMED UP AND GO:  Was the test performed? No . .     Cognitive Function:     6CIT Screen 04/17/2020 01/01/2018 11/08/2016  What Year? 0 points 0 points 0 points  What month? 0 points 0 points 0 points  What time? 0 points 0 points 0 points  Count back from 20 0 points 0 points 0 points  Months in reverse 0 points 0 points 0 points  Repeat phrase 0 points 2 points 0 points  Total Score 0 2 0    Immunizations Immunization History  Administered Date(s) Administered  . Influenza, High Dose Seasonal PF 02/21/2016, 02/12/2017  . Influenza,inj,Quad PF,6+ Mos 02/22/2015  . Influenza-Unspecified 02/16/2014  . Pneumococcal Conjugate-13 08/31/2013  . Td 08/31/2013  . Zoster 11/12/2011    TDAP status: Up to date Flu Vaccine status: Declined, Education has been provided regarding the importance of this vaccine but patient still declined. Advised may receive this vaccine at local pharmacy or Health Dept. Aware to provide a copy of the vaccination record if obtained from local pharmacy or Health Dept. Verbalized acceptance and understanding. Pneumococcal vaccine status: Up to date Covid-19 vaccine status: Declined, Education has been provided regarding the importance of this vaccine but patient still declined. Advised may receive this vaccine at local pharmacy or Health Dept.or vaccine clinic. Aware to provide a copy of the vaccination record if obtained from local pharmacy or Health Dept. Verbalized acceptance and understanding.  Qualifies for Shingles Vaccine? Yes   Zostavax completed Yes   Shingrix Completed?: No.    Education has been provided regarding the importance of this  vaccine. Patient has been advised to call insurance company to determine out of pocket expense if they have not yet received this vaccine. Advised may also receive vaccine at local pharmacy or Health Dept. Verbalized acceptance and understanding.  Screening Tests Health Maintenance  Topic Date Due  . Hepatitis C Screening  Never done  . COVID-19 Vaccine (1) Never done  . INFLUENZA VACCINE  08/17/2020 (Originally 12/19/2019)  . TETANUS/TDAP  09/01/2023  . DEXA SCAN  Completed  . PNA vac Low Risk Adult  Completed    Health Maintenance  Health Maintenance Due  Topic Date Due  . Hepatitis C Screening  Never done  . COVID-19 Vaccine (1) Never done    Colorectal cancer screening: No longer required.  Mammogram status: No longer required.  Bone Density status: Completed 11/29/2008.   Lung Cancer Screening: (Low Dose CT Chest recommended if Age 45-80 years, 42  pack-year currently smoking OR have quit w/in 15years.) does not qualify.   Lung Cancer Screening Referral: no  Additional Screening:  Hepatitis C Screening: does qualify; due  Vision Screening: Recommended annual ophthalmology exams for early detection of glaucoma and other disorders of the eye. Is the patient up to date with their annual eye exam?  Yes  Who is the provider or what is the name of the office in which the patient attends annual eye exams? [facility] If pt is not established with a provider, would they like to be referred to a provider to establish care? No .   Dental Screening: Recommended annual dental exams for proper oral hygiene  Community Resource Referral / Chronic Care Management: CRR required this visit?  No   CCM required this visit?  No      Plan:     I have personally reviewed and noted the following in the patient's chart:   . Medical and social history . Use of alcohol, tobacco or illicit drugs  . Current medications and supplements . Functional ability and status . Nutritional  status . Physical activity . Advanced directives . List of other physicians . Hospitalizations, surgeries, and ER visits in previous 12 months . Vitals . Screenings to include cognitive, depression, and falls . Referrals and appointments  In addition, I have reviewed and discussed with patient certain preventive protocols, quality metrics, and best practice recommendations. A written personalized care plan for preventive services as well as general preventive health recommendations were provided to patient.     Barb Merino, LPN   16/02/[id number]   Nurse Notes:

## 2020-05-10 DIAGNOSIS — H2513 Age-related nuclear cataract, bilateral: Secondary | ICD-10-CM | POA: Diagnosis not present

## 2020-05-15 NOTE — Progress Notes (Deleted)
Date:  05/15/2020   ID:  [name], DOB Nov 17, 1940, MRN [id number]  Patient Location:  [address] [place] [address]   Provider location:   [facility],  office  PCP:  Marjie Skiff, NP  Cardiologist:  [facility]  No chief complaint on file.     History of Present Illness:    [name] is a 79 y.o. female who presents via audio/video conferencing for a telehealth visit today.   The patient does not symptoms concerning for COVID-19 infection (fever, chills, cough, or new SHORTNESS OF BREATH).   Patient has a past medical history of Morbid obesity Chronic Leg edema Lymphedema HTN Non smoker Severe sleep apnea, followed by pulmonary Extensive secondhand smoke exposure for approximately 60 years Presents for follow-up of her aortic atherosclerosis  Last seen March 2018 Retired in 07/09/2020Husband passed away one year ago  Has not been out much as she has [facility] Hospital doctor to [employer] one week  Legs wake her up at night  On lasix  As needed Lymphedema, arms and legs Does not use compression pumps  Sleeves do not fit  On nutrisystem, water Does not get hungry  144/60, pulse pulse in the 50 Ox 96 to 98%  Labs reviewed Total 195, LDL 82   CXR  The heart is enlarged but stable. There is moderate tortuosity, ectasia and calcification of the thoracic aorta.  Denies having any symptoms of orthostasis when her heart rate is slow Heart rate increases up to 80 bpm with ambulation She monitors heart rate with wrist watch  No regular exercise program, uses a walker in her house Denies any chest pain, shortness of breath on exertion Family is moving in with her   Prior CV studies:   The following studies were reviewed today:   Past Medical History:  Diagnosis Date  . Allergy   . Arthritis   . Asthma   . Chronic fatigue syndrome   . Fractured fibula   . GERD (gastroesophageal reflux disease)   . Hypertension   .  Lymphedema 2004  . Neuromuscular disorder (HCC)   . Pneumonia    Past Surgical History:  Procedure Laterality Date  . ABDOMINAL HYSTERECTOMY  1974  . BIOPSY THYROID    . CHOLECYSTECTOMY  2005     Allergies:   Latex, Benicar [olmesartan], Ciprofloxacin, Diovan [valsartan], Flagyl [metronidazole], Lotensin [benazepril hcl], Penicillins, and Sulfa antibiotics   Social History   Tobacco Use  . Smoking status: Never Smoker  . Smokeless tobacco: Never Used  Vaping Use  . Vaping Use: Never used  Substance Use Topics  . Alcohol use: No    Alcohol/week: 0.0 standard drinks  . Drug use: No     Current Outpatient Medications on File Prior to Visit  Medication Sig Dispense Refill  . albuterol (VENTOLIN HFA) 108 (90 Base) MCG/ACT inhaler Inhale 2 puffs into the lungs every 6 (six) hours as needed for wheezing or shortness of breath. 8 g 2  . Apple Cider Vinegar 500 MG TABS Take 500 mg by mouth 2 (two) times a day.     Marland Kitchen aspirin 81 MG tablet Take 81 mg by mouth daily.    Marland Kitchen atorvastatin (LIPITOR) 10 MG tablet TAKE 1 TABLET(10 MG) BY MOUTH DAILY 90 tablet 3  . buPROPion (WELLBUTRIN SR) 150 MG 12 hr tablet Take 1 tablet (150 mg total) by mouth 2 (two) times daily. 180 tablet 2  .  Cholecalciferol (VITAMIN D-3 PO) Take 10,000 Units by mouth daily.     Marland Kitchen docusate sodium (COLACE) 100 MG capsule Take 100 mg by mouth in the morning, at noon, and at bedtime.    . furosemide (LASIX) 40 MG tablet TAKE 1 TABLET(40 MG) BY MOUTH DAILY AS NEEDED 90 tablet 0  . irbesartan (AVAPRO) 300 MG tablet Take 1 tablet (300 mg total) by mouth daily. 90 tablet 3  . magnesium oxide (MAG-OX) 400 MG tablet Take 400 mg by mouth 2 (two) times a day.     . Misc Natural Products (NEURIVA PO) Take by mouth.    . nebivolol (BYSTOLIC) 5 MG tablet Take 1 tablet (5 mg total) by mouth daily. 90 tablet 1  . omeprazole (PRILOSEC) 20 MG capsule TAKE 1 CAPSULE(20 MG) BY MOUTH DAILY 90 capsule 0  . Probiotic Product (PROBIOTIC PO) Take  1 tablet by mouth daily.    . Tiotropium Bromide Monohydrate (SPIRIVA RESPIMAT) 1.25 MCG/ACT AERS Inhale 2 puffs into the lungs daily. 4 g 3  . traZODone (DESYREL) 50 MG tablet Take 1 tablet (50 mg total) by mouth at bedtime as needed for sleep. 90 tablet 2  . triamcinolone cream (KENALOG) 0.1 % APPLY TOPICALLY TO THE AFFECTED AREA TWICE DAILY 454 g 5  . vitamin C (ASCORBIC ACID) 500 MG tablet Take 500 mg by mouth daily.    . vitamin E 400 UNIT capsule Take 400 Units by mouth daily.    Monte Fantasia INHUB 250-50 MCG/DOSE AEPB INHALE 1 PUFF INTO THE LUNGS TWICE DAILY 60 each 1   No current facility-administered medications on file prior to visit.     Family Hx: The patient's family history includes Alcoholism in her father; Cancer in her brother; Diabetes in her father; Heart disease in her mother; Hyperlipidemia in her mother; Hypertension in her son; Stroke in her maternal grandmother.  ROS:   Please see the history of present illness.    Review of Systems  Constitutional: Negative.   HENT: Negative.   Respiratory: Negative.   Cardiovascular: Positive for leg swelling.  Gastrointestinal: Negative.   Musculoskeletal: Negative.   Neurological: Negative.   Psychiatric/Behavioral: Negative.   All other systems reviewed and are negative.    Labs/Other Tests and Data Reviewed:    Recent Labs: 08/25/2019: ALT 12; BUN 10; Creatinine, Ser 0.84; Hemoglobin 13.9; Platelets 275; Potassium 4.6; Sodium 139; TSH 3.650   Recent Lipid Panel Lab Results  Component Value Date/Time   CHOL 185 08/25/2019 03:22 PM   CHOL 213 (H) 02/12/2017 09:39 AM   TRIG 84 08/25/2019 03:22 PM   TRIG 115 02/12/2017 09:39 AM   HDL 89 08/25/2019 03:22 PM   CHOLHDL 2.4 03/09/2018 11:42 AM   LDLCALC 81 08/25/2019 03:22 PM    Wt Readings from Last 3 Encounters:  04/17/20 (!) 333 lb (151 kg)  06/07/19 (!) 333 lb (151 kg)  06/08/18 (!) 357 lb (161.9 kg)     Exam:    Vital Signs: Vital signs may also be detailed in  the HPI LMP  (LMP Unknown)   Constitutional:  oriented to person, place, and time. No distress.  HENT:  Head: Grossly normal Eyes:  no discharge. No scleral icterus.  Neck: No JVD, no carotid bruits  Cardiovascular: Regular rate and rhythm, no murmurs appreciated Pulmonary/Chest: Clear to auscultation bilaterally, no wheezes or rails Abdominal: Soft.  no distension.  no tenderness.  Musculoskeletal: Normal range of motion Neurological:  normal muscle tone. Coordination normal. No atrophy  Skin: Skin warm and dry Psychiatric: normal affect, pleasant   ASSESSMENT & PLAN:    Problem List Items Addressed This Visit   None    Lymphedema: Having more leg pain, Thinks it is because she is no longer doing massage on her legs, worsening lymphedema Because of is not getting around as much Legs feel restless at nighttime Lymphedema compression pumps do not work, sleeves were not fitted well.  She still has the pump -We have recommend referral to vein and vascular, new prescription for lymphedema sleeves, perhaps could work with her previous pump  Hypertension Mildly elevated, she has a good regiment splitting the pills in half taking them twice a day which seems to work for her We will avoid hypotension  Aortic atherosclerosis Tolerating Lipitor 10 mg daily, will not push it more given some leg discomfort Number should drop with Nutrisystem   COVID-19 Education: The signs and symptoms of COVID-19 were discussed with the patient and how to seek care for testing (follow up with PCP or arrange E-visit).  The importance of social distancing was discussed today.  Patient Risk:   After full review of this patients clinical status, I feel that they are at least moderate risk at this time.  Time:   Today, I have spent 25 minutes with the patient with telehealth technology discussing the cardiac and medical problems/diagnoses detailed above   Additional 10 min spent reviewing the chart  prior to patient visit today   Medication Adjustments/Labs and Tests Ordered: Current medicines are reviewed at length with the patient today.  Concerns regarding medicines are outlined above.   Tests Ordered: No tests ordered   Medication Changes: No changes made   Disposition: Follow-up in 12 months   Signed, Julien Nordmann, MD  [facility] [address], [place], [place] [address]

## 2020-05-16 ENCOUNTER — Telehealth: Payer: Self-pay | Admitting: Cardiovascular Disease

## 2020-05-16 NOTE — Telephone Encounter (Signed)
°  Patient Consent for Virtual Visit         [name] has provided verbal consent on 05/16/2020 for a virtual visit (video or telephone).   CONSENT FOR VIRTUAL VISIT FOR:  [name]  By participating in this virtual visit I agree to the following:  I hereby voluntarily request, consent and authorize [facility] and its employed or contracted physicians, physician assistants, nurse practitioners or other licensed health care professionals (the Practitioner), to provide me with telemedicine health care services (the Services") as deemed necessary by the treating Practitioner. I acknowledge and consent to receive the Services by the Practitioner via telemedicine. I understand that the telemedicine visit will involve communicating with the Practitioner through live audiovisual communication technology and the disclosure of certain medical information by electronic transmission. I acknowledge that I have been given the opportunity to request an in-person assessment or other available alternative prior to the telemedicine visit and am voluntarily participating in the telemedicine visit.  I understand that I have the right to withhold or withdraw my consent to the use of telemedicine in the course of my care at any time, without affecting my right to future care or treatment, and that the Practitioner or I may terminate the telemedicine visit at any time. I understand that I have the right to inspect all information obtained and/or recorded in the course of the telemedicine visit and may receive copies of available information for a reasonable fee.  I understand that some of the potential risks of receiving the Services via telemedicine include:   Delay or interruption in medical evaluation due to technological equipment failure or disruption;  Information transmitted may not be sufficient (e.g. poor resolution of images) to allow for appropriate medical decision making by the Practitioner; and/or    In rare instances, security protocols could fail, causing a breach of personal health information.  Furthermore, I acknowledge that it is my responsibility to provide information about my medical history, conditions and care that is complete and accurate to the best of my ability. I acknowledge that Practitioner's advice, recommendations, and/or decision may be based on factors not within their control, such as incomplete or inaccurate data provided by me or distortions of diagnostic images or specimens that may result from electronic transmissions. I understand that the practice of medicine is not an exact science and that Practitioner makes no warranties or guarantees regarding treatment outcomes. I acknowledge that a copy of this consent can be made available to me via my patient portal [facility] MyChart), or I can request a printed copy by calling the office of [facility].    I understand that my insurance will be billed for this visit.   I have read or had this consent read to me.  I understand the contents of this consent, which adequately explains the benefits and risks of the Services being provided via telemedicine.   I have been provided ample opportunity to ask questions regarding this consent and the Services and have had my questions answered to my satisfaction.  I give my informed consent for the services to be provided through the use of telemedicine in my medical care

## 2020-05-17 ENCOUNTER — Ambulatory Visit: Payer: Medicare Other | Admitting: Cardiovascular Disease

## 2020-05-20 DIAGNOSIS — U071 COVID-19: Secondary | ICD-10-CM

## 2020-05-20 HISTORY — DX: COVID-19: U07.1

## 2020-05-29 ENCOUNTER — Other Ambulatory Visit: Payer: Self-pay | Admitting: Family Medicine

## 2020-05-29 ENCOUNTER — Encounter: Payer: Self-pay | Admitting: Family Medicine

## 2020-05-29 ENCOUNTER — Telehealth (INDEPENDENT_AMBULATORY_CARE_PROVIDER_SITE_OTHER): Payer: Medicare PPO | Admitting: Family Medicine

## 2020-05-29 VITALS — BP 145/73 | Temp 97.6°F

## 2020-05-29 DIAGNOSIS — U071 COVID-19: Secondary | ICD-10-CM | POA: Diagnosis not present

## 2020-05-29 MED ORDER — BENZONATATE 200 MG PO CAPS: 200.0000 mg | ORAL_CAPSULE | Freq: Two times a day (BID) | ORAL | 0 refills | Status: DC | PRN

## 2020-05-29 MED ORDER — HYDROCOD POLST-CPM POLST ER 10-8 MG/5ML PO SUER: 5.0000 mL | Freq: Two times a day (BID) | ORAL | 0 refills | Status: DC | PRN

## 2020-05-29 MED ORDER — PREDNISONE 10 MG PO TABS
ORAL_TABLET | ORAL | 0 refills | Status: DC
Start: 2020-05-29 — End: 2020-10-17

## 2020-05-29 NOTE — Progress Notes (Signed)
BP (!) 145/73   Temp 97.6 F (36.4 C)   LMP  (LMP Unknown)   SpO2 96%    Subjective:    Patient ID: [name], female    DOB: 1941/03/19, 80 y.o.   MRN: [id number]  HPI: [name] is a 80 y.o. female  Chief Complaint  Patient presents with  . Covid Positive    Pt states she tested positive for covid on December 30. Patient still having cough and some muscle aches. Patient would like to be prescribed something for cough   UPPER RESPIRATORY TRACT INFECTION Duration: 2 weeks Worst symptom: cough Fever: no Cough: yes Shortness of breath: no Wheezing: no Chest pain: no Chest tightness: no Chest congestion: yes Nasal congestion: no Runny nose: no Post nasal drip: yes Sneezing: no Sore throat: yes Swollen glands: no Sinus pressure: no Headache: no Face pain: no Toothache: no Ear pain: no  Ear pressure: yes- R ear  Eyes red/itching:no Eye drainage/crusting: no  Vomiting: no Rash: no Fatigue: yes Sick contacts: no Strep contacts: no  Context: better Recurrent sinusitis: no Relief with OTC cold/cough medications: no  Treatments attempted: cold/sinus and mucinex   Relevant past medical, surgical, family and social history reviewed and updated as indicated. Interim medical history since our last visit reviewed. Allergies and medications reviewed and updated.  Review of Systems  Constitutional: Positive for fatigue. Negative for activity change, appetite change, chills, diaphoresis, fever and unexpected weight change.  HENT: Negative.   Eyes: Negative.   Respiratory: Positive for cough and chest tightness. Negative for apnea, choking, shortness of breath, wheezing and stridor.   Cardiovascular: Negative.   Gastrointestinal: Negative.   Psychiatric/Behavioral: Negative.     Per HPI unless specifically indicated above     Objective:    BP (!) 145/73   Temp 97.6 F (36.4 C)   LMP  (LMP Unknown)   SpO2 96%   Wt Readings from Last 3 Encounters:  04/17/20  (!) 333 lb (151 kg)  06/07/19 (!) 333 lb (151 kg)  06/08/18 (!) 357 lb (161.9 kg)    Physical Exam Vitals and nursing note reviewed.  Pulmonary:     Effort: Pulmonary effort is normal. No respiratory distress.     Comments: Speaking in full sentences Neurological:     Mental Status: She is alert.  Psychiatric:        Mood and Affect: Mood normal.        Behavior: Behavior normal.        Thought Content: Thought content normal.        Judgment: Judgment normal.     Results for orders placed or performed in visit on 08/25/19  Lipid Panel w/o Chol/HDL Ratio  Result Value Ref Range   Cholesterol, Total 185 100 - 199 mg/dL   Triglycerides 84 0 - 149 mg/dL   HDL 89 >16 mg/dL   VLDL Cholesterol Cal 15 5 - 40 mg/dL   LDL Chol Calc (NIH) 81 0 - 99 mg/dL  Thyroid Panel With TSH  Result Value Ref Range   TSH 3.650 0.450 - 4.500 uIU/mL   T4, Total 9.1 4.5 - 12.0 ug/dL   T3 Uptake Ratio 24 24 - 39 %   Free Thyroxine Index 2.2 1.2 - 4.9  CBC with Differential/Platelet  Result Value Ref Range   WBC 5.4 3.4 - 10.8 x10E3/uL   RBC 4.43 3.77 - 5.28 x10E6/uL   Hemoglobin 13.9 11.1 - 15.9 g/dL   Hematocrit 07.3 71.0 -  46.6 %   MCV 93 79 - 97 fL   MCH 31.4 26.6 - 33.0 pg   MCHC 33.9 31.5 - 35.7 g/dL   RDW 62.7 03.5 - 00.9 %   Platelets 275 150 - 450 x10E3/uL   Neutrophils 61 Not Estab. %   Lymphs 30 Not Estab. %   Monocytes 9 Not Estab. %   Eos 0 Not Estab. %   Basos 0 Not Estab. %   Neutrophils Absolute 3.3 1.4 - 7.0 x10E3/uL   Lymphocytes Absolute 1.6 0.7 - 3.1 x10E3/uL   Monocytes Absolute 0.5 0.1 - 0.9 x10E3/uL   EOS (ABSOLUTE) 0.0 0.0 - 0.4 x10E3/uL   Basophils Absolute 0.0 0.0 - 0.2 x10E3/uL   Immature Granulocytes 0 Not Estab. %   Immature Grans (Abs) 0.0 0.0 - 0.1 x10E3/uL  Comprehensive metabolic panel  Result Value Ref Range   Glucose 91 65 - 99 mg/dL   BUN 10 8 - 27 mg/dL   Creatinine, Ser 3.81 0.57 - 1.00 mg/dL   GFR calc non Af Amer 66 >59 mL/min/1.73   GFR calc  Af Amer 76 >59 mL/min/1.73   BUN/Creatinine Ratio 12 12 - 28   Sodium 139 134 - 144 mmol/L   Potassium 4.6 3.5 - 5.2 mmol/L   Chloride 105 96 - 106 mmol/L   CO2 21 20 - 29 mmol/L   Calcium 10.6 (H) 8.7 - 10.3 mg/dL   Total Protein 6.6 6.0 - 8.5 g/dL   Albumin 4.3 3.7 - 4.7 g/dL   Globulin, Total 2.3 1.5 - 4.5 g/dL   Albumin/Globulin Ratio 1.9 1.2 - 2.2   Bilirubin Total 0.6 0.0 - 1.2 mg/dL   Alkaline Phosphatase 91 39 - 117 IU/L   AST 17 0 - 40 IU/L   ALT 12 0 - 32 IU/L  Bayer DCA Hb A1c Waived  Result Value Ref Range   HB A1C (BAYER DCA - WAIVED) 5.1 <7.0 %  Vitamin B12  Result Value Ref Range   Vitamin B-12 518 232 - 1,245 pg/mL      Assessment & Plan:   Problem List Items Addressed This Visit   None   Visit Diagnoses    COVID-19    -  Primary   Out of quarantine range. Will obtain CXR to r/o pneumonia. Treat with prednisone, albuterol, tussionex and tessalon. Await results. Treat as needed.    Relevant Orders   DG Chest 2 View       Follow up plan: Return if symptoms worsen or fail to improve.   . This visit was completed via telephone due to the restrictions of the COVID-19 pandemic. All issues as above were discussed and addressed but no physical exam was performed. If it was felt that the patient should be evaluated in the office, they were directed there. The patient verbally consented to this visit. Patient was unable to complete an audio/visual visit due to Lack of equipment. Due to the catastrophic nature of the COVID-19 pandemic, this visit was done through audio contact only. . Location of the patient: home . Location of the provider: home . Those involved with this call:  . Provider: Olevia Perches, DO . CMA: Rolley Sims, CMA . Front Desk/Registration: Harriet Pho  . Time spent on call: 21 minutes on the phone discussing health concerns. 30 minutes total spent in review of patient's record and preparation of their chart.

## 2020-05-30 ENCOUNTER — Ambulatory Visit
Admission: RE | Admit: 2020-05-30 | Discharge: 2020-05-30 | Disposition: A | Discharge status: Home / Self Care | Payer: Medicare PPO | Source: Ambulatory Visit | Attending: Family Medicine | Admitting: Family Medicine

## 2020-05-30 ENCOUNTER — Other Ambulatory Visit: Payer: Self-pay | Admitting: Family Medicine

## 2020-05-30 ENCOUNTER — Ambulatory Visit
Admission: RE | Admit: 2020-05-30 | Discharge: 2020-05-30 | Disposition: A | Discharge status: Home / Self Care | Payer: Medicare PPO | Attending: Family Medicine | Admitting: Family Medicine

## 2020-05-30 ENCOUNTER — Telehealth: Payer: Self-pay

## 2020-05-30 ENCOUNTER — Other Ambulatory Visit: Payer: Self-pay

## 2020-05-30 ENCOUNTER — Ambulatory Visit: Payer: Self-pay | Admitting: *Deleted

## 2020-05-30 DIAGNOSIS — U071 COVID-19: Secondary | ICD-10-CM | POA: Insufficient documentation

## 2020-05-30 MED ORDER — HYDROCOD POLST-CPM POLST ER 10-8 MG/5ML PO SUER
5.0000 mL | Freq: Two times a day (BID) | ORAL | 0 refills | Status: DC | PRN
Start: 2020-05-30 — End: 2020-10-17

## 2020-05-30 NOTE — Telephone Encounter (Signed)
Copied from CRM [id number]. Topic: Quick Communication - See Telephone Encounter >> May 29, 2020  6:34 PM Aretta Nip wrote: CRM for notification. See Telephone encounter for: 05/29/20.chlorpheniramine-HYDROcodone (TUSSIONEX PENNKINETIC ER) 10-8 MG/5ML SUER 50 mL 0 05/29/2020  Was Sent in for after appt today as treatment and the pharmacy refuses to fill as they said no refills listed but script written today. Pls contact pharmacy as pt is in tears. Pls FU with pt at  [phone]

## 2020-05-30 NOTE — Telephone Encounter (Signed)
Already sent in, please refuse

## 2020-05-30 NOTE — Telephone Encounter (Signed)
Please advise pt seen yesterday 

## 2020-05-30 NOTE — Telephone Encounter (Signed)
Patient called to report severe weakness this am from persistent covid symptoms. Patient reports she was seen by PCP yesterday and she is to go have chest x-Knope. Patient reports she feels she is too weak to go. Patient denies fever, difficulty breathing. C/o her body is too weak now, after getting dressed to go for x-[place]. Encouraged patient to rest and hydrate as much as she can to try and get x-[place]. Patient is requesting PCP to give her some medication for possible pneumonia. Educated patient that PCP will learn more from chest x-Ferraz and what kind of medication may help her with information from results of x-[place]. Patient's daughter is with patient at this time. Instructed patient to call back if she was unable to get x-Heminger. Care advise given. Patient verbalized understanding of care advise and to call back or go to ED if symptoms worsen.   Reason for Disposition . [1] PERSISTING SYMPTOMS OF COVID-19 AND [2] symptoms WORSE  Answer Assessment - Initial Assessment Questions 1. COVID-19 ONSET: "When did the symptoms of COVID-19 first start?"     2 weeks ago  2. DIAGNOSIS CONFIRMATION: "How were you diagnosed?" (e.g., COVID-19 oral or nasal viral test; COVID-19 antibody test; doctor visit) NA  3. MAIN SYMPTOM:  "What is your main concern or symptom right now?" (e.g., breathing difficulty, cough, fatigue. loss of smell)     "I think I have pneumonia" 4. SYMPTOM ONSET: "When did the  na  start?"     na 5. BETTER-SAME-WORSE: "Are you getting better, staying the same, or getting worse over the last 1 to 2 weeks?"     worse 6. RECENT MEDICAL VISIT: "Have you been seen by a healthcare provider (doctor, NP, PA) for these persisting COVID-19 symptoms?" If Yes, ask: "When were you seen?" (e.g., date)     Yes Dr. Carollee Leitz  7. COUGH: "Do you have a cough?" If Yes, ask: "How bad is the cough?"       Yes  8. FEVER: "Do you have a fever?" If Yes, ask: "What is your temperature, how was it measured, and  when did it start?"     no 9. BREATHING DIFFICULTY: "Are you having any trouble breathing?" If Yes, ask: "How bad is your breathing?" (e.g., mild, moderate, severe)    - MILD: No SOB at rest, mild SOB with walking, speaks normally in sentences, can lie down, no retractions, pulse < 100.    - MODERATE: SOB at rest, SOB with minimal exertion and prefers to sit, cannot lie down flat, speaks in phrases, mild retractions, audible wheezing, pulse 100-120.    - SEVERE: Very SOB at rest, speaks in single words, struggling to breathe, sitting hunched forward, retractions, pulse > 120      " Some " 10. HIGH RISK DISEASE: "Do you have any chronic medical problems?" (e.g., asthma, heart or lung disease, weak immune system, obesity, etc.)       Age  28. VACCINE: "Have you gotten the COVID-19 vaccine?" If Yes ask: "Which one, how many shots, when did you get it?"       na 12. PREGNANCY: "Is there any chance you are pregnant?" "When was your last menstrual period?"       na 13. OTHER SYMPTOMS: "Do you have any other symptoms?"  (e.g., fatigue, headache, muscle pain, weakness)       Body aches, weakness  Protocols used: CORONAVIRUS (COVID-19) PERSISTING SYMPTOMS FOLLOW-UP CALL-A-AH

## 2020-05-30 NOTE — Telephone Encounter (Signed)
She has prednisone, tussionex and tessalon perles- if she is too weak to get up out of bed, she will need to go to the ER. OK to get the x-Rasnick as soon as she can, but if she's getting worse she will need to go to the ER

## 2020-05-30 NOTE — Telephone Encounter (Signed)
Pt stated she was able to get her xray done.

## 2020-05-30 NOTE — Telephone Encounter (Signed)
   Notes to clinic: looks like this was sent yesterday This refill cannot be delegated or refused by NT    Requested Prescriptions  Pending Prescriptions Disp Refills   chlorpheniramine-HYDROcodone (TUSSIONEX) 10-8 MG/5ML SUER [Pharmacy Med Name: HYDROCODONE/CHLORPHEN ER SUSPENSION] 50 mL     Sig: SHAKE LIQUID AND TAKE 5 ML BY MOUTH EVERY 12 HOURS AS NEEDED      Off-Protocol Failed - 05/29/2020  5:10 PM      Failed - Medication not assigned to a protocol, review manually.      Passed - Valid encounter within last 12 months    Recent Outpatient Visits           Yesterday COVID-19   [facility] [place], [place] P, DO   2 months ago Constipation, unspecified constipation type   [facility] Gabriel Cirri, NP   9 months ago Recurrent major depressive disorder, in remission [facility])   [facility] [place], [name] T, NP   10 months ago Recurrent major depressive disorder, in remission (HCC)   [facility] [place], Dorie Rank, NP   1 year ago Essential hypertension, benign   [facility] [place], Dorie Rank, NP       Future Appointments             In 3 weeks Gollan, Tollie Pizza, MD [facility]   In 10 months  [employer], [facility]

## 2020-06-01 ENCOUNTER — Telehealth: Payer: Self-pay

## 2020-06-01 NOTE — Telephone Encounter (Signed)
Copied from CRM [id number]. Topic: General - Other >> Jun 01, 2020  1:25 PM Gwenlyn Fudge wrote: Reason for CRM: Pt called stating that the medications she was recently prescribed are working for her. She states that she is having mucus and phlegm that is green and yellow and is requesting to have advice from PCP. Please advise.  PT. Had apt on 05/29/20 would she need be seen

## 2020-06-01 NOTE — Telephone Encounter (Signed)
Patient was recently seen and is concerned she needs an antibiotic due to her phlegm being a greenish yellow color. States she is still coughing and its like a spasm where she starts coughing and cant stop.

## 2020-06-02 MED ORDER — AZITHROMYCIN 250 MG PO TABS
ORAL_TABLET | ORAL | 0 refills | Status: DC
Start: 2020-06-02 — End: 2020-10-17

## 2020-06-02 NOTE — Telephone Encounter (Signed)
Patient notified

## 2020-06-02 NOTE — Telephone Encounter (Signed)
Keep taking the prednisone. Azithromycin called into her pharmacy. Call if not getting better

## 2020-06-02 NOTE — Telephone Encounter (Signed)
Called and LVM asking for patient to please return my call.  

## 2020-06-02 NOTE — Telephone Encounter (Signed)
Thankfully [name] was negative for any pneumonia. Is she feeling better with the prednisone, worse or about the same? If she's better, no need for antibiotic. If she's worse, I can call her in something.

## 2020-06-02 NOTE — Telephone Encounter (Signed)
Pt called back in she says that she is feeling a little better with the prednisone but she had to stop taking the tessalon due to it giving her a dry cough and spasmatic coughing. She states that she has not taking the tessalon today and she isn't having the spasmatic cough. She is still coughing up yellowish phlegm. Please advise.

## 2020-06-15 ENCOUNTER — Telehealth: Payer: Self-pay

## 2020-06-15 ENCOUNTER — Telehealth: Payer: Self-pay | Admitting: Nurse Practitioner

## 2020-06-15 NOTE — Telephone Encounter (Signed)
Pt called stating that she is currently taking, Nebibolol. She states that in that medication there are ace inhibitors which is allergic to and she cannot take this medication. Pt states that the medication is causing her to cough. Pt is requesting to possibly be put back on the 5mg  that she was previously on. Please advise.      [facility] , [place] - [address]  [address] [place]  Phone: [phone] Fax: [phone]  Hours: Not open 24 hours

## 2020-06-15 NOTE — Telephone Encounter (Signed)
Can we verify this, current rx in chart for Nebivolol (Bystolic) states 5 MG daily, [name] sent it in at recent visit.  I an unsure of changes she is requesting.

## 2020-06-15 NOTE — Telephone Encounter (Signed)
Routing to provider to advise.  

## 2020-06-15 NOTE — Telephone Encounter (Signed)
Called and spoke with patient. She states that we just need to send over a current medication and allergy list for [facility] to have. She states she will request refills from them as needed but wants them to have her list so they will know what she is taking and allergic to.   Medication and allergy list printed and faxed to [facility] as requested.

## 2020-06-15 NOTE — Telephone Encounter (Signed)
Called pt she states that she no longer uses [facility] and they will not send her info to [facility]. Pt states that [facility] need to have her rx sent to them with med list and allergies. Please advise.  Copied from CRM [id number]. Topic: General - Other >> Jun 15, 2020  3:31 PM Jaquita Rector A wrote: Reason for CRM: Patient called in asking for her medication records to be sent over to [facility]. States that she asked and they refuse to request her records. Any questions please call Ph# [phone]

## 2020-06-21 ENCOUNTER — Telehealth: Payer: Medicare PPO | Admitting: Cardiovascular Disease

## 2020-07-03 ENCOUNTER — Other Ambulatory Visit: Payer: Self-pay | Admitting: Nurse Practitioner

## 2020-07-12 ENCOUNTER — Encounter: Payer: Self-pay | Admitting: Ophthalmology

## 2020-07-14 ENCOUNTER — Other Ambulatory Visit: Payer: Self-pay | Admitting: Nurse Practitioner

## 2020-07-24 ENCOUNTER — Other Ambulatory Visit: Payer: Self-pay | Admitting: Family Medicine

## 2020-07-24 MED ORDER — FLUTICASONE-SALMETEROL 250-50 MCG/DOSE IN AEPB: INHALATION_SPRAY | RESPIRATORY_TRACT | 1 refills | Status: DC

## 2020-07-24 NOTE — Telephone Encounter (Signed)
   Notes to clinic:  this script is expired  Review for continue use and refill Patient needs today    Requested Prescriptions  Pending Prescriptions Disp Refills   Fluticasone-Salmeterol (WIXELA INHUB) 250-50 MCG/DOSE AEPB 60 each 1    Sig: INHALE 1 PUFF INTO THE LUNGS TWICE DAILY      Pulmonology:  [employer] Passed - 07/24/2020 12:44 PM      Passed - Valid encounter within last 12 months    Recent Outpatient Visits           1 month ago COVID-19   [facility], Megan P, DO   4 months ago Constipation, unspecified constipation type   [facility] Gabriel Cirri, NP   11 months ago Recurrent major depressive disorder, in remission (HCC)   [facility] [place], [name] T, NP   1 year ago Recurrent major depressive disorder, in remission (HCC)   [facility] Cannady, Dorie Rank, NP   1 year ago Essential hypertension, benign   [facility] [place], Dorie Rank, NP       Future Appointments             In 1 month Gollan, Tollie Pizza, MD [facility] , [facility]   In 8 months  [facility], [facility]

## 2020-07-24 NOTE — Telephone Encounter (Signed)
Looks like she is actually yours

## 2020-07-24 NOTE — Telephone Encounter (Signed)
Patient was last seen on 04/12/20 and upcoming in 04/18/21.

## 2020-07-24 NOTE — Telephone Encounter (Signed)
Medication Refill - Medication: Wixela   Has the patient contacted their pharmacy? Yes.   Pt states that the pharmacy has on record that PCP does not use fax and asked pt to contact PCP. She states that she used the last bit of this inhaler today. Please advise.  (Agent: If no, request that the patient contact the pharmacy for the refill.) (Agent: If yes, when and what did the pharmacy advise?)  Preferred Pharmacy (with phone number or street name):  [id number] [place], [place] - [address] AT [facility] [address] [facility]  [address] [place] [address]  Phone: [phone] Fax: [phone]  Hours: Not open 24 hours     Agent: Please be advised that RX refills may take up to 3 business days. We ask that you follow-up with your pharmacy.

## 2020-07-25 ENCOUNTER — Other Ambulatory Visit
Admission: RE | Admit: 2020-07-25 | Discharge: 2020-07-25 | Disposition: A | Discharge status: Home / Self Care | Payer: Medicare PPO | Source: Ambulatory Visit | Attending: Ophthalmology | Admitting: Ophthalmology

## 2020-07-25 ENCOUNTER — Other Ambulatory Visit: Payer: Medicare PPO

## 2020-07-25 ENCOUNTER — Other Ambulatory Visit: Payer: Self-pay

## 2020-07-25 DIAGNOSIS — Z20822 Contact with and (suspected) exposure to covid-19: Secondary | ICD-10-CM | POA: Insufficient documentation

## 2020-07-25 DIAGNOSIS — Z01812 Encounter for preprocedural laboratory examination: Secondary | ICD-10-CM | POA: Diagnosis not present

## 2020-07-26 LAB — SARS CORONAVIRUS 2 (TAT 6-24 HRS): SARS Coronavirus 2: NEGATIVE

## 2020-07-27 ENCOUNTER — Other Ambulatory Visit: Payer: Self-pay

## 2020-07-27 ENCOUNTER — Ambulatory Visit: Payer: Medicare PPO | Admitting: Anesthesiology

## 2020-07-27 ENCOUNTER — Encounter
Admission: RE | Disposition: A | Discharge status: Home / Self Care | Payer: Self-pay | Source: Home / Self Care | Attending: Ophthalmology

## 2020-07-27 ENCOUNTER — Encounter: Payer: Self-pay | Admitting: Ophthalmology

## 2020-07-27 ENCOUNTER — Ambulatory Visit
Admission: RE | Admit: 2020-07-27 | Discharge: 2020-07-27 | Disposition: A | Discharge status: Home / Self Care | Payer: Medicare PPO | Attending: Ophthalmology | Admitting: Ophthalmology

## 2020-07-27 DIAGNOSIS — H2512 Age-related nuclear cataract, left eye: Secondary | ICD-10-CM | POA: Diagnosis not present

## 2020-07-27 DIAGNOSIS — E785 Hyperlipidemia, unspecified: Secondary | ICD-10-CM | POA: Diagnosis not present

## 2020-07-27 DIAGNOSIS — Z888 Allergy status to other drugs, medicaments and biological substances status: Secondary | ICD-10-CM | POA: Insufficient documentation

## 2020-07-27 DIAGNOSIS — Z7951 Long term (current) use of inhaled steroids: Secondary | ICD-10-CM | POA: Insufficient documentation

## 2020-07-27 DIAGNOSIS — Z880 Allergy status to penicillin: Secondary | ICD-10-CM | POA: Insufficient documentation

## 2020-07-27 DIAGNOSIS — Z8616 Personal history of COVID-19: Secondary | ICD-10-CM | POA: Diagnosis not present

## 2020-07-27 DIAGNOSIS — Z881 Allergy status to other antibiotic agents status: Secondary | ICD-10-CM | POA: Diagnosis not present

## 2020-07-27 DIAGNOSIS — J189 Pneumonia, unspecified organism: Secondary | ICD-10-CM | POA: Diagnosis not present

## 2020-07-27 DIAGNOSIS — Z7982 Long term (current) use of aspirin: Secondary | ICD-10-CM | POA: Insufficient documentation

## 2020-07-27 DIAGNOSIS — Z79899 Other long term (current) drug therapy: Secondary | ICD-10-CM | POA: Insufficient documentation

## 2020-07-27 DIAGNOSIS — J440 Chronic obstructive pulmonary disease with acute lower respiratory infection: Secondary | ICD-10-CM | POA: Diagnosis not present

## 2020-07-27 DIAGNOSIS — Z882 Allergy status to sulfonamides status: Secondary | ICD-10-CM | POA: Insufficient documentation

## 2020-07-27 DIAGNOSIS — G4733 Obstructive sleep apnea (adult) (pediatric): Secondary | ICD-10-CM | POA: Diagnosis not present

## 2020-07-27 DIAGNOSIS — Z6841 Body Mass Index (BMI) 40.0 and over, adult: Secondary | ICD-10-CM | POA: Diagnosis not present

## 2020-07-27 DIAGNOSIS — K219 Gastro-esophageal reflux disease without esophagitis: Secondary | ICD-10-CM | POA: Diagnosis not present

## 2020-07-27 DIAGNOSIS — I10 Essential (primary) hypertension: Secondary | ICD-10-CM | POA: Diagnosis not present

## 2020-07-27 DIAGNOSIS — Z91040 Latex allergy status: Secondary | ICD-10-CM | POA: Diagnosis not present

## 2020-07-27 DIAGNOSIS — E1136 Type 2 diabetes mellitus with diabetic cataract: Secondary | ICD-10-CM | POA: Diagnosis not present

## 2020-07-27 HISTORY — DX: Personal history of other medical treatment: Z92.89

## 2020-07-27 HISTORY — DX: Diverticulosis of intestine, part unspecified, without perforation or abscess without bleeding: K57.90

## 2020-07-27 HISTORY — DX: Anemia, unspecified: D64.9

## 2020-07-27 HISTORY — DX: Zoster without complications: B02.9

## 2020-07-27 HISTORY — DX: Gout, unspecified: M10.9

## 2020-07-27 HISTORY — DX: Dyspnea, unspecified: R06.00

## 2020-07-27 HISTORY — DX: Headache, unspecified: R51.9

## 2020-07-27 HISTORY — DX: Chronic obstructive pulmonary disease, unspecified: J44.9

## 2020-07-27 HISTORY — DX: Sleep apnea, unspecified: G47.30

## 2020-07-27 HISTORY — PX: CATARACT EXTRACTION W/PHACO: SHX586

## 2020-07-27 SURGERY — PHACOEMULSIFICATION, CATARACT, WITH IOL INSERTION
Anesthesia: Monitor Anesthesia Care | Laterality: Left

## 2020-07-27 MED ORDER — ARMC OPHTHALMIC DILATING DROPS
OPHTHALMIC | Status: AC
  Administered 2020-07-27: 1 via OPHTHALMIC
  Filled 2020-07-27: qty 0.5

## 2020-07-27 MED ORDER — MIDAZOLAM HCL 2 MG/2ML IJ SOLN
INTRAMUSCULAR | Status: AC
  Filled 2020-07-27: qty 2

## 2020-07-27 MED ORDER — SODIUM HYALURONATE 10 MG/ML IO SOLN
INTRAOCULAR | Status: DC | PRN
  Administered 2020-07-27: 0.55 mL via INTRAOCULAR

## 2020-07-27 MED ORDER — SODIUM CHLORIDE 0.9 % IV SOLN: INTRAVENOUS | Status: DC

## 2020-07-27 MED ORDER — POLYMYXIN B-TRIMETHOPRIM 10000-0.1 UNIT/ML-% OP SOLN: 1.0000 [drp] | Freq: Once | OPHTHALMIC | Status: DC

## 2020-07-27 MED ORDER — ARMC OPHTHALMIC DILATING DROPS
1.0000 "application " | OPHTHALMIC | Status: AC
  Administered 2020-07-27 (×2): 1 via OPHTHALMIC

## 2020-07-27 MED ORDER — MOXIFLOXACIN HCL 0.5 % OP SOLN
OPHTHALMIC | Status: AC
  Filled 2020-07-27: qty 3

## 2020-07-27 MED ORDER — LIDOCAINE HCL (PF) 4 % IJ SOLN
INTRAOCULAR | Status: DC | PRN
  Administered 2020-07-27: 2 mL via OPHTHALMIC

## 2020-07-27 MED ORDER — MIDAZOLAM HCL 2 MG/2ML IJ SOLN
INTRAMUSCULAR | Status: DC | PRN
  Administered 2020-07-27 (×3): 1 mg via INTRAVENOUS

## 2020-07-27 MED ORDER — TETRACAINE HCL 0.5 % OP SOLN
OPHTHALMIC | Status: AC
  Administered 2020-07-27: 1 [drp] via OPHTHALMIC
  Filled 2020-07-27: qty 4

## 2020-07-27 MED ORDER — EPINEPHRINE PF 1 MG/ML IJ SOLN
INTRAMUSCULAR | Status: AC
  Filled 2020-07-27: qty 6

## 2020-07-27 MED ORDER — POVIDONE-IODINE 5 % OP SOLN
OPHTHALMIC | Status: DC | PRN
  Administered 2020-07-27: 1 via OPHTHALMIC

## 2020-07-27 MED ORDER — TETRACAINE HCL 0.5 % OP SOLN: 1.0000 [drp] | Freq: Once | OPHTHALMIC | Status: AC

## 2020-07-27 MED ORDER — EPINEPHRINE PF 1 MG/ML IJ SOLN
INTRAOCULAR | Status: DC | PRN
  Administered 2020-07-27: 200 mL via OPHTHALMIC

## 2020-07-27 MED ORDER — POLYMYXIN B-TRIMETHOPRIM 10000-0.1 UNIT/ML-% OP SOLN
OPHTHALMIC | Status: AC
  Filled 2020-07-27: qty 10

## 2020-07-27 MED ORDER — SODIUM HYALURONATE 23 MG/ML IO SOLN
INTRAOCULAR | Status: DC | PRN
  Administered 2020-07-27: 0.6 mL via INTRAOCULAR

## 2020-07-27 MED ORDER — SODIUM HYALURONATE 23 MG/ML IO SOLN
INTRAOCULAR | Status: AC
  Filled 2020-07-27: qty 1.8

## 2020-07-27 MED ORDER — PROVISC 10 MG/ML IO SOLN
INTRAOCULAR | Status: AC
  Filled 2020-07-27: qty 1.65

## 2020-07-27 MED ORDER — ONDANSETRON HCL 4 MG/2ML IJ SOLN: 4.0000 mg | Freq: Once | INTRAMUSCULAR | Status: DC | PRN

## 2020-07-27 SURGICAL SUPPLY — 17 items
DISSECTOR HYDRO NUCLEUS 50X22 (MISCELLANEOUS) ×2 IMPLANT
GLOVE BIOGEL M 6.5 STRL (GLOVE) ×2 IMPLANT
GLOVE SURG ENC MOIS LTX SZ8 (GLOVE) ×2 IMPLANT
GLOVE SURG LX 7.5 STRW (GLOVE) ×1
GLOVE SURG LX STRL 7.5 STRW (GLOVE) ×1 IMPLANT
GOWN STRL REUS W/ TWL LRG LVL3 (GOWN DISPOSABLE) ×2 IMPLANT
GOWN STRL REUS W/TWL LRG LVL3 (GOWN DISPOSABLE) ×4
LABEL CATARACT MEDS ST (LABEL) ×2 IMPLANT
LENS IOL TECNIS EYHANCE 18.5 (Intraocular Lens) ×2 IMPLANT
MANIFOLD NEPTUNE II (INSTRUMENTS) ×2 IMPLANT
PACK CATARACT (MISCELLANEOUS) ×2 IMPLANT
PACK CATARACT KING (MISCELLANEOUS) ×2 IMPLANT
PACK EYE AFTER SURG (MISCELLANEOUS) ×2 IMPLANT
SOL BSS BAG (MISCELLANEOUS) ×2
SOLUTION BSS BAG (MISCELLANEOUS) ×1 IMPLANT
WATER STERILE IRR 250ML POUR (IV SOLUTION) ×2 IMPLANT
WIPE NON LINTING 3.25X3.25 (MISCELLANEOUS) ×2 IMPLANT

## 2020-07-27 NOTE — Transfer of Care (Signed)
Immediate Anesthesia Transfer of Care Note  Patient: [name]  Procedure(s) Performed: CATARACT EXTRACTION PHACO AND INTRAOCULAR LENS PLACEMENT (IOC) LEFT DIABETIC (Left )  Patient Location: PACU  Anesthesia Type:MAC  Level of Consciousness: awake, alert  and oriented  Airway & Oxygen Therapy: Patient Spontanous Breathing  Post-op Assessment: Report given to RN and Post -op Vital signs reviewed and stable  Post vital signs: Reviewed and stable  Last Vitals:  Vitals Value Taken Time  BP    Temp    Pulse    Resp    SpO2      Last Pain:  Vitals:   07/27/20 0700  TempSrc: Temporal  PainSc: 0-No pain         Complications: No complications documented.

## 2020-07-27 NOTE — Addendum Note (Signed)
Addendum  created 07/27/20 1738 by Junious Silk, CRNA   Intraprocedure Event edited

## 2020-07-27 NOTE — Discharge Instructions (Signed)
AMBULATORY SURGERY  DISCHARGE INSTRUCTIONS   1) The drugs that you were given will stay in your system until tomorrow so for the next 24 hours you should not:  A) Drive an automobile B) Make any legal decisions C) Drink any alcoholic beverage   2) You may resume regular meals tomorrow.  Today it is better to start with liquids and gradually work up to solid foods.  You may eat anything you prefer, but it is better to start with liquids, then soup and crackers, and gradually work up to solid foods.   3) Please notify your doctor immediately if you have any unusual bleeding, trouble breathing, fever, or pain not relieved by medication.    4) Additional Instructions:  See drop schedule as reviewed Please contact your physician with any problems or Same Day Surgery at [phone], Monday through Friday 6 am to 4 pm, or [place] [facility] number at [phone].

## 2020-07-27 NOTE — Anesthesia Preprocedure Evaluation (Signed)
Anesthesia Evaluation  Patient identified by MRN, date of birth, ID band Patient awake    Reviewed: Allergy & Precautions, NPO status , Patient's Chart, lab work & pertinent test results  History of Anesthesia Complications Negative for: history of anesthetic complications  Airway Mallampati: II  TM Distance: >3 FB Neck ROM: Full    Dental  (+) Upper Dentures, Lower Dentures   Pulmonary asthma , sleep apnea and Continuous Positive Airway Pressure Ventilation , COPD,  COPD inhaler, Patient abstained from smoking.Not current smoker,    Pulmonary exam normal breath sounds clear to auscultation       Cardiovascular Exercise Tolerance: Good METShypertension, (-) CAD and (-) Past MI (-) dysrhythmias  Rhythm:Regular Rate:Normal - Systolic murmurs    Neuro/Psych  Headaches, PSYCHIATRIC DISORDERS Depression  Neuromuscular disease    GI/Hepatic GERD  Controlled and Medicated,(+)     (-) substance abuse  ,   Endo/Other  neg diabetesMorbid obesity  Renal/GU negative Renal ROS     Musculoskeletal  (+) Arthritis ,   Abdominal (+) + obese,   Peds  Hematology  (+) anemia ,   Anesthesia Other Findings Past Medical History: No date: Allergy No date: Anemia     Comment:  vitamin d deficiency No date: Arthritis No date: Asthma No date: Chronic fatigue syndrome No date: COPD (chronic obstructive pulmonary disease) (HCC) No date: Diverticulosis No date: Dyspnea No date: Fractured fibula No date: GERD (gastroesophageal reflux disease) No date: Gout No date: Headache     Comment:  migraines No date: History of blood transfusion No date: Hypertension 2004: Lymphedema No date: Neuromuscular disorder ([place]) No date: Pneumonia No date: Shingles No date: Sleep apnea     Comment:  cpap  Reproductive/Obstetrics                             Anesthesia Physical Anesthesia Plan  ASA: III  Anesthesia  Plan: MAC   Post-op Pain Management:    Induction: Intravenous  PONV Risk Score and Plan: 2 and Midazolam  Airway Management Planned: Nasal Cannula  Additional Equipment:   Intra-op Plan:   Post-operative Plan:   Informed Consent: I have reviewed the patients History and Physical, chart, labs and discussed the procedure including the risks, benefits and alternatives for the proposed anesthesia with the patient or authorized representative who has indicated his/her understanding and acceptance.       Plan Discussed with: CRNA and Surgeon  Anesthesia Plan Comments: (Explained risks of anesthesia, including PONV, and rare emergencies requiring invasive intervention. Patient understands. Patient informed about increased incidence of above perioperative risk due to high BMI. Patient understands.  )        Anesthesia Quick Evaluation

## 2020-07-27 NOTE — H&P (Signed)

## 2020-07-27 NOTE — Op Note (Addendum)
OPERATIVE NOTE  [name] [id number] 07/27/2020   PREOPERATIVE DIAGNOSIS:  Nuclear sclerotic cataract left eye.  H25.12   POSTOPERATIVE DIAGNOSIS:    Nuclear sclerotic cataract left eye.     PROCEDURE:  Phacoemusification with posterior chamber intraocular lens placement of the left eye   LENS:   Implant Name Type Inv. Item Serial No. Manufacturer Lot No. LRB No. Used Action  LENS IOL TECNIS EYHANCE 18.5 - [id number] Intraocular Lens LENS IOL TECNIS EYHANCE 18.5 [id number] [id number]   Left 1 Implanted      Procedure(s) with comments: CATARACT EXTRACTION PHACO AND INTRAOCULAR LENS PLACEMENT (IOC) LEFT DIABETIC (Left) - Lot #[id number] [place]; 01:04.8 CDE: 8.86  DIB00 +18.5   ULTRASOUND TIME: 1 minutes 08 seconds.  CDE 8.86   SURGEON:  Willey Blade, MD, MPH   ANESTHESIA:  Topical with tetracaine drops augmented with 1% preservative-free intracameral lidocaine.  ESTIMATED BLOOD LOSS: <1 mL   COMPLICATIONS:  None.   DESCRIPTION OF PROCEDURE:  The patient was identified in the holding room and transported to the operating room and placed in the supine position under the operating microscope.  The left eye was identified as the operative eye and it was prepped and draped in the usual sterile ophthalmic fashion.   A 1.0 millimeter clear-corneal paracentesis was made at the 5:00 position. 0.5 ml of preservative-free 1% lidocaine with epinephrine was injected into the anterior chamber.  The anterior chamber was filled with Healon 5 viscoelastic.  A 2.4 millimeter keratome was used to make a near-clear corneal incision at the 2:00 position.  A curvilinear capsulorrhexis was made with a cystotome and capsulorrhexis forceps.  Balanced salt solution was used to hydrodissect and hydrodelineate the nucleus.   Phacoemulsification was then used in stop and chop fashion to remove the lens nucleus and epinucleus.  The remaining cortex was then removed using the irrigation and aspiration handpiece.  Healon was then placed into the capsular bag to distend it for lens placement.  A lens was then injected into the capsular bag.  The remaining viscoelastic was aspirated.   Wounds were hydrated with balanced salt solution.  The anterior chamber was inflated to a physiologic pressure with balanced salt solution.  Due to the patients allergies, no intracameral antibiotics were used. Polytrim drops were placed on the ocular surface.   No wound leaks were noted.  The patient was taken to the recovery room in stable condition without complications of anesthesia or surgery  Willey Blade 07/27/2020, 8:10 AM

## 2020-07-27 NOTE — Anesthesia Procedure Notes (Signed)
Date/Time: 07/27/2020 7:53 AM Performed by: Junious Silk, CRNA Pre-anesthesia Checklist: Patient identified, Emergency Drugs available, Suction available, Patient being monitored and Timeout performed Oxygen Delivery Method: Nasal cannula

## 2020-07-27 NOTE — Anesthesia Postprocedure Evaluation (Signed)
Anesthesia Post Note  Patient: [name]  Procedure(s) Performed: CATARACT EXTRACTION PHACO AND INTRAOCULAR LENS PLACEMENT (IOC) LEFT DIABETIC (Left )  Patient location during evaluation: PACU Anesthesia Type: MAC Level of consciousness: awake and alert Pain management: pain level controlled Vital Signs Assessment: post-procedure vital signs reviewed and stable Respiratory status: spontaneous breathing, nonlabored ventilation, respiratory function stable and patient connected to nasal cannula oxygen Cardiovascular status: stable and blood pressure returned to baseline Postop Assessment: no apparent nausea or vomiting Anesthetic complications: no   No complications documented.   Last Vitals:  Vitals:   07/27/20 0814 07/27/20 0818  BP: (!) 101/50 (!) 126/54  Pulse: (!) 51   Resp: 18   Temp: 36.7 C   SpO2: 100%     Last Pain:  Vitals:   07/27/20 0814  TempSrc: Oral  PainSc: 0-No pain                 Arita Miss

## 2020-07-28 ENCOUNTER — Encounter: Payer: Self-pay | Admitting: Ophthalmology

## 2020-08-24 ENCOUNTER — Other Ambulatory Visit: Payer: Self-pay | Admitting: Nurse Practitioner

## 2020-08-24 NOTE — Telephone Encounter (Signed)
Requested Prescriptions  Pending Prescriptions Disp Refills  . traZODone (DESYREL) 50 MG tablet [Pharmacy Med Name: TRAZODONE 50MG  TABLETS] 90 tablet 2    Sig: TAKE 1 TABLET(50 MG) BY MOUTH AT BEDTIME AS NEEDED FOR SLEEP     Psychiatry: Antidepressants - Serotonin Modulator Passed - 08/24/2020 11:19 PM      Passed - Completed PHQ-2 or PHQ-9 in the last 360 days      Passed - Valid encounter within last 6 months    Recent Outpatient Visits          2 months ago COVID-19   [facility], Megan P, DO   5 months ago Constipation, unspecified constipation type   [facility], NP   1 year ago Recurrent major depressive disorder, in remission (HCC)   [facility] Cannady, [name] T, NP   1 year ago Recurrent major depressive disorder, in remission (HCC)   [facility] [place], Gabriel Cirri, NP   1 year ago Essential hypertension, benign   [facility] [place], [place], NP      Future Appointments            In 1 week Gollan, Dorie Rank, MD [facility] [place], [facility]   In 7 months  [facility], [facility]

## 2020-09-05 ENCOUNTER — Ambulatory Visit: Payer: Medicare PPO | Admitting: Cardiovascular Disease

## 2020-10-11 ENCOUNTER — Other Ambulatory Visit: Payer: Self-pay | Admitting: Nurse Practitioner

## 2020-10-12 ENCOUNTER — Other Ambulatory Visit: Payer: Self-pay | Admitting: Nurse Practitioner

## 2020-10-13 ENCOUNTER — Encounter: Payer: Self-pay | Admitting: Nurse Practitioner

## 2020-10-13 DIAGNOSIS — Z8616 Personal history of COVID-19: Secondary | ICD-10-CM | POA: Insufficient documentation

## 2020-10-13 NOTE — Telephone Encounter (Signed)
Pt scheduled for 10/17/2020 stated she completely out of medication.

## 2020-10-17 ENCOUNTER — Ambulatory Visit: Payer: Medicare PPO | Admitting: Nurse Practitioner

## 2020-10-17 ENCOUNTER — Other Ambulatory Visit: Payer: Self-pay

## 2020-10-17 ENCOUNTER — Encounter: Payer: Self-pay | Admitting: Nurse Practitioner

## 2020-10-17 VITALS — BP 127/68 | HR 65 | Temp 98.7°F

## 2020-10-17 DIAGNOSIS — F5101 Primary insomnia: Secondary | ICD-10-CM

## 2020-10-17 DIAGNOSIS — E782 Mixed hyperlipidemia: Secondary | ICD-10-CM | POA: Diagnosis not present

## 2020-10-17 DIAGNOSIS — E041 Nontoxic single thyroid nodule: Secondary | ICD-10-CM

## 2020-10-17 DIAGNOSIS — J4540 Moderate persistent asthma, uncomplicated: Secondary | ICD-10-CM

## 2020-10-17 DIAGNOSIS — I700 Atherosclerosis of aorta: Secondary | ICD-10-CM

## 2020-10-17 DIAGNOSIS — E559 Vitamin D deficiency, unspecified: Secondary | ICD-10-CM

## 2020-10-17 DIAGNOSIS — I890 Lymphedema, not elsewhere classified: Secondary | ICD-10-CM | POA: Diagnosis not present

## 2020-10-17 DIAGNOSIS — Z6841 Body Mass Index (BMI) 40.0 and over, adult: Secondary | ICD-10-CM | POA: Diagnosis not present

## 2020-10-17 DIAGNOSIS — G4733 Obstructive sleep apnea (adult) (pediatric): Secondary | ICD-10-CM | POA: Diagnosis not present

## 2020-10-17 DIAGNOSIS — F3340 Major depressive disorder, recurrent, in remission, unspecified: Secondary | ICD-10-CM | POA: Diagnosis not present

## 2020-10-17 DIAGNOSIS — I10 Essential (primary) hypertension: Secondary | ICD-10-CM

## 2020-10-17 DIAGNOSIS — K219 Gastro-esophageal reflux disease without esophagitis: Secondary | ICD-10-CM

## 2020-10-17 DIAGNOSIS — I872 Venous insufficiency (chronic) (peripheral): Secondary | ICD-10-CM | POA: Diagnosis not present

## 2020-10-17 DIAGNOSIS — E538 Deficiency of other specified B group vitamins: Secondary | ICD-10-CM | POA: Diagnosis not present

## 2020-10-17 MED ORDER — IRBESARTAN 300 MG PO TABS: ORAL_TABLET | ORAL | 4 refills | Status: DC

## 2020-10-17 MED ORDER — TRIAMCINOLONE ACETONIDE 0.1 % EX CREA: TOPICAL_CREAM | CUTANEOUS | 5 refills | Status: DC

## 2020-10-17 MED ORDER — MUPIROCIN CALCIUM 2 % EX CREA: 1.0000 "application " | TOPICAL_CREAM | Freq: Two times a day (BID) | CUTANEOUS | 4 refills | Status: DC | PRN

## 2020-10-17 MED ORDER — FUROSEMIDE 40 MG PO TABS: ORAL_TABLET | ORAL | 4 refills | Status: DC

## 2020-10-17 MED ORDER — OMEPRAZOLE 20 MG PO CPDR: DELAYED_RELEASE_CAPSULE | ORAL | 4 refills | Status: DC

## 2020-10-17 MED ORDER — TRAZODONE HCL 50 MG PO TABS: ORAL_TABLET | ORAL | 4 refills | Status: DC

## 2020-10-17 NOTE — Assessment & Plan Note (Signed)
Ongoing, continue daily supplement and recheck B12 level today. 

## 2020-10-17 NOTE — Assessment & Plan Note (Signed)
Chronic, stable.  Continue current medication regimen and adjust as needed based on mood.  Denies SI/HI.  Refills sent in.

## 2020-10-17 NOTE — Assessment & Plan Note (Signed)
Chronic, ongoing.  Recommend use of Tylenol as needed for pain, max 3000 MG total daily dosing.  Avoid Ibuprofen or NSAID products.  Continue using compression wraps daily and massage therapy.  Will have her return to vascular as needed.

## 2020-10-17 NOTE — Assessment & Plan Note (Signed)
Chronic, ongoing.  Benefit from Trazodone at night as needed.  Continue current medication regimen and adjust as needed.  Continue focus on sleep hygiene. 

## 2020-10-17 NOTE — Assessment & Plan Note (Signed)
Chronic, ongoing.  Recommend use of Tylenol as needed for pain, max 3000 MG total daily dosing.  Continue using compression wraps daily and massage therapy.  Will have her return to vascular as needed. 

## 2020-10-17 NOTE — Assessment & Plan Note (Signed)
Recommend continued focus on healthy diet choices and regular physical activity (30 minutes 5 days a week).  Not weighed today, in electric W/C.

## 2020-10-17 NOTE — Patient Instructions (Signed)

## 2020-10-17 NOTE — Assessment & Plan Note (Signed)
Chronic, stable.  Continue current medication regimen and adjust as needed.  May benefit from return to pulmonary in upcoming months, will further discuss next visit. 

## 2020-10-17 NOTE — Assessment & Plan Note (Signed)
Continue 100% use of CPAP at night.

## 2020-10-17 NOTE — Assessment & Plan Note (Signed)
Chronic, stable with good control.  Continue current medication regimen and adjust as needed.  Check mag level yearly.

## 2020-10-17 NOTE — Assessment & Plan Note (Signed)
Chronic, ongoing.  Continue current medication regimen and adjust as needed. Lipid panel today. 

## 2020-10-17 NOTE — Assessment & Plan Note (Signed)
Followed by Dr. Jenne Campus.  Due to continued c/o fatigue will obtain TSH today.

## 2020-10-17 NOTE — Assessment & Plan Note (Signed)
Chronic, ongoing with BP at goal today. Continue current medication regimen and adjust as needed + collaboration with cardiology.  Recommend she monitor BP at least a few mornings a week at home and document.  DASH diet at home.  Labs today: CBC, CMP, TSH.  Return in 6 months.

## 2020-10-17 NOTE — Assessment & Plan Note (Signed)
Noted on CT scan 09/10/2017.  Recommend continue ASA and statin daily for prevention.

## 2020-10-17 NOTE — Progress Notes (Signed)
BP 127/68   Pulse 65   Temp 98.7 F (37.1 C) (Oral)   LMP  (LMP Unknown)   SpO2 96%    Subjective:    Patient ID: [name], female    DOB: 1941-02-01, 80 y.o.   MRN: [id number]  HPI: CARLOYN [name] is a 80 y.o. female  Chief Complaint  Patient presents with  . Medication Refill    Patient states she is here for medication refills and to discuss having lab work due to it being hard as far as transportation for her appointments.   . Leg Pain    Patient states she has been experiencing pain in her legs and calf even while sitting in her recliner. Patient states she just would love to be able to walk again.    HYPERTENSION / HYPERLIPIDEMIA Continues on Furosemide, ASA, Avapro, Bystolic, and Atorvastatin.  Sees Dr. Mariah Milling again in July, last visit was 05/16/20.    Has chronic lymphedema and was going for massages -- with Covid her massage therapist shut down, she returned to massages in April 2022 -- goes once a month.  Does not take Tylenol for this.  In past Meloxicam caused kidney issues.  Uses compression pumps at home.  Has seen vascular in past, last 06/07/19. Satisfied with current treatment? yes Duration of hypertension: chronic BP monitoring frequency: daily BP range: 120-130/80 BP medication side effects: no Duration of hyperlipidemia: chronic Cholesterol medication side effects: no Cholesterol supplements: none Medication compliance: good compliance Aspirin: no Recent stressors: no Recurrent headaches: no Visual changes: no Palpitations: no Dyspnea: no Chest pain: no Lower extremity edema: baseline Dizzy/lightheaded: no   COPD Continues Wixela & Spiriva and Albuterol as needed.  Uses CPAP 100% at home.  Last saw Dr. Belia Heman with pulmonary - 10/15/2018. COPD status: stable Satisfied with current treatment?: yes Oxygen use: no Dyspnea frequency: occasional Cough frequency: stable Rescue inhaler frequency:  5 times a month Limitation of activity: no Productive  cough: none Last Spirometry: unknown Pneumovax: Up to Date Influenza: Up to Date  DEPRESSION Continues on Wellbutrin and Trazodone 50 MG at bedtime as needed.   Mood status: stable Satisfied with current treatment?: yes Symptom severity: mild  Duration of current treatment : chronic Side effects: no Medication compliance: good compliance Psychotherapy/counseling: none Depressed mood: no Anxious mood: no Anhedonia: no Significant weight loss or gain: no Insomnia: yes hard to fall asleep Fatigue: no Feelings of worthlessness or guilt: no Impaired concentration/indecisiveness: no Suicidal ideations: no Hopelessness: no Crying spells: no Depression screen [facility] 2/9 10/17/2020 04/17/2020 07/09/2019 04/12/2019 12/22/2018  Decreased Interest 0 0 0 0 0  Down, Depressed, Hopeless 0 0 0 0 0  PHQ - 2 Score 0 0 0 0 0  Altered sleeping 1 0 0 - 0  Tired, decreased energy 3 1 0 - 2  Change in appetite 0 0 0 - 0  Feeling bad or failure about yourself  0 1 0 - 0  Trouble concentrating 0 0 0 - 0  Moving slowly or fidgety/restless 0 0 0 - 0  Suicidal thoughts 0 0 0 - 0  PHQ-9 Score 4 2 0 - 2  Difficult doing work/chores Not difficult at all Not difficult at all Not difficult at all - Not difficult at all  Some recent data might be hidden   GERD Continues on Prilosec 20 MG daily. GERD control status: controlled  Satisfied with current treatment? yes Heartburn frequency: none Medication side effects: no  Medication compliance: stable  Previous GERD medications: Dysphagia: no Odynophagia:  no Hematemesis: no Blood in stool: no EGD: no  VITAMIN D DEFICIENCY: Last level improved at 38.9 and is continuing daily supplement.  Denies muscle pain or recent falls.  Relevant past medical, surgical, family and social history reviewed and updated as indicated. Interim medical history since our last visit reviewed. Allergies and medications reviewed and updated.  Review of Systems   Constitutional: Negative for activity change, appetite change, diaphoresis, fatigue and fever.  Respiratory: Negative for cough, chest tightness and shortness of breath.   Cardiovascular: Negative for chest pain, palpitations and leg swelling.  Endocrine: Negative for cold intolerance, heat intolerance, polydipsia, polyphagia and polyuria.  Musculoskeletal: Positive for arthralgias.  Neurological: Negative.   Psychiatric/Behavioral: Negative.     Per HPI unless specifically indicated above     Objective:    BP 127/68   Pulse 65   Temp 98.7 F (37.1 C) (Oral)   LMP  (LMP Unknown)   SpO2 96%   Wt Readings from Last 3 Encounters:  07/12/20 (!) 320 lb (145.2 kg)  04/17/20 (!) 333 lb (151 kg)  06/07/19 (!) 333 lb (151 kg)    Physical Exam Vitals and nursing note reviewed.  Constitutional:      General: She is awake. She is not in acute distress.    Appearance: She is well-developed. She is morbidly obese. She is not ill-appearing.  HENT:     Head: Normocephalic.     Right Ear: Hearing normal.     Left Ear: Hearing normal.     Nose: Nose normal.     Mouth/Throat:     Mouth: Mucous membranes are moist.  Eyes:     General: Lids are normal.        Right eye: No discharge.        Left eye: No discharge.     Conjunctiva/sclera: Conjunctivae normal.     Pupils: Pupils are equal, round, and reactive to light.  Neck:     Thyroid: No thyromegaly.     Vascular: No carotid bruit.  Cardiovascular:     Rate and Rhythm: Normal rate and regular rhythm.     Heart sounds: Normal heart sounds. No murmur heard. No gallop.   Pulmonary:     Effort: Pulmonary effort is normal. No accessory muscle usage or respiratory distress.     Breath sounds: Normal breath sounds.  Abdominal:     General: Bowel sounds are normal.     Palpations: Abdomen is soft.  Musculoskeletal:     Cervical back: Normal range of motion and neck supple.     Right lower leg: 3+ Edema present.     Left lower leg:  3+ Edema present.  Lymphadenopathy:     Cervical: No cervical adenopathy.  Skin:    General: Skin is warm and dry.  Neurological:     Mental Status: She is alert and oriented to person, place, and time.  Psychiatric:        Attention and Perception: Attention normal.        Mood and Affect: Mood normal.        Speech: Speech normal.        Behavior: Behavior normal. Behavior is cooperative.        Thought Content: Thought content normal.        Judgment: Judgment normal.    Results for orders placed or performed during the hospital encounter of 07/25/20  SARS CORONAVIRUS 2 (TAT 6-24 HRS) Nasopharyngeal Nasopharyngeal  Swab   Specimen: Nasopharyngeal Swab  Result Value Ref Range   SARS Coronavirus 2 NEGATIVE NEGATIVE      Assessment & Plan:   Problem List Items Addressed This Visit      Cardiovascular and Mediastinum   Essential hypertension, benign    Chronic, ongoing with BP at goal today. Continue current medication regimen and adjust as needed + collaboration with cardiology.  Recommend she monitor BP at least a few mornings a week at home and document.  DASH diet at home.  Labs today: CBC, CMP, TSH.  Return in 6 months.       Relevant Medications   furosemide (LASIX) 40 MG tablet   irbesartan (AVAPRO) 300 MG tablet   Other Relevant Orders   Comprehensive metabolic panel   TSH   Aortic atherosclerosis (HCC)    Noted on CT scan 09/10/2017.  Recommend continue ASA and statin daily for prevention.      Relevant Medications   furosemide (LASIX) 40 MG tablet   irbesartan (AVAPRO) 300 MG tablet   Chronic venous insufficiency    Chronic, ongoing.  Recommend use of Tylenol as needed for pain, max 3000 MG total daily dosing.  Continue using compression wraps daily and massage therapy.  Will have her return to vascular as needed.      Relevant Medications   furosemide (LASIX) 40 MG tablet   irbesartan (AVAPRO) 300 MG tablet     Respiratory   Asthma    Chronic, stable.   Continue current medication regimen and adjust as needed.  May benefit from return to pulmonary in upcoming months, will further discuss next visit.      Relevant Orders   CBC with Differential/Platelet   OSA (obstructive sleep apnea)    Continue 100% use of CPAP at night.        Digestive   GERD without esophagitis    Chronic, stable with good control.  Continue current medication regimen and adjust as needed.  Check mag level yearly.        Relevant Medications   omeprazole (PRILOSEC) 20 MG capsule   Other Relevant Orders   Magnesium     Endocrine   Thyroid nodule    Followed by Dr. Jenne CampusMcQueen.  Due to continued c/o fatigue will obtain TSH today.        Other   Lymphedema    Chronic, ongoing.  Recommend use of Tylenol as needed for pain, max 3000 MG total daily dosing.  Avoid Ibuprofen or NSAID products.  Continue using compression wraps daily and massage therapy.  Will have her return to vascular as needed.      Relevant Medications   furosemide (LASIX) 40 MG tablet   Depression - Primary    Chronic, stable.  Continue current medication regimen and adjust as needed based on mood.  Denies SI/HI.  Refills sent in.      Relevant Medications   traZODone (DESYREL) 50 MG tablet   Hyperlipidemia    Chronic, ongoing.  Continue current medication regimen and adjust as needed.  Lipid panel today.      Relevant Medications   furosemide (LASIX) 40 MG tablet   irbesartan (AVAPRO) 300 MG tablet   Other Relevant Orders   Lipid Panel w/o Chol/HDL Ratio   Vitamin B12 deficiency    Ongoing, continue daily supplement and recheck B12 level today.      Relevant Orders   Vitamin B12   Vitamin D deficiency    Ongoing and stable.  Continue daily supplement and recheck level today.      Relevant Orders   VITAMIN D 25 Hydroxy (Vit-D Deficiency, Fractures)   Insomnia    Chronic, ongoing.  Benefit from Trazodone at night as needed.  Continue current medication regimen and adjust as  needed.  Continue focus on sleep hygiene.      BMI 50.0-59.9, adult (HCC)    Recommend continued focus on healthy diet choices and regular physical activity (30 minutes 5 days a week).  Not weighed today, in electric W/C.          Follow up plan: Return in about 6 months (around 04/18/2021) for HTN/HLD, MOOD, GERD, ASTHMA, OSA.

## 2020-10-17 NOTE — Assessment & Plan Note (Signed)
Ongoing and stable.  Continue daily supplement and recheck level today. 

## 2020-10-18 ENCOUNTER — Other Ambulatory Visit: Payer: Self-pay

## 2020-10-18 DIAGNOSIS — H2511 Age-related nuclear cataract, right eye: Secondary | ICD-10-CM | POA: Diagnosis not present

## 2020-10-18 LAB — CBC WITH DIFFERENTIAL/PLATELET
Basophils Absolute: 0 10*3/uL (ref 0.0–0.2)
Basos: 0 %
EOS (ABSOLUTE): 0 10*3/uL (ref 0.0–0.4)
Eos: 1 %
Hematocrit: 43.7 % (ref 34.0–46.6)
Hemoglobin: 14.6 g/dL (ref 11.1–15.9)
Immature Grans (Abs): 0 10*3/uL (ref 0.0–0.1)
Immature Granulocytes: 0 %
Lymphocytes Absolute: 1.6 10*3/uL (ref 0.7–3.1)
Lymphs: 35 %
MCH: 30.2 pg (ref 26.6–33.0)
MCHC: 33.4 g/dL (ref 31.5–35.7)
MCV: 91 fL (ref 79–97)
Monocytes Absolute: 0.4 10*3/uL (ref 0.1–0.9)
Monocytes: 10 %
Neutrophils Absolute: 2.5 10*3/uL (ref 1.4–7.0)
Neutrophils: 54 %
Platelets: 258 10*3/uL (ref 150–450)
RBC: 4.83 x10E6/uL (ref 3.77–5.28)
RDW: 12.7 % (ref 11.7–15.4)
WBC: 4.6 10*3/uL (ref 3.4–10.8)

## 2020-10-18 LAB — COMPREHENSIVE METABOLIC PANEL
ALT: 14 IU/L (ref 0–32)
AST: 17 IU/L (ref 0–40)
Albumin/Globulin Ratio: 1.8 (ref 1.2–2.2)
Albumin: 4.3 g/dL (ref 3.7–4.7)
Alkaline Phosphatase: 92 IU/L (ref 44–121)
BUN/Creatinine Ratio: 20 (ref 12–28)
BUN: 17 mg/dL (ref 8–27)
Bilirubin Total: 0.2 mg/dL (ref 0.0–1.2)
CO2: 18 mmol/L — ABNORMAL LOW (ref 20–29)
Calcium: 10.3 mg/dL (ref 8.7–10.3)
Chloride: 107 mmol/L — ABNORMAL HIGH (ref 96–106)
Creatinine, Ser: 0.87 mg/dL (ref 0.57–1.00)
Globulin, Total: 2.4 g/dL (ref 1.5–4.5)
Glucose: 113 mg/dL — ABNORMAL HIGH (ref 65–99)
Potassium: 4.6 mmol/L (ref 3.5–5.2)
Sodium: 140 mmol/L (ref 134–144)
Total Protein: 6.7 g/dL (ref 6.0–8.5)
eGFR: 67 mL/min/{1.73_m2} (ref >59)

## 2020-10-18 LAB — LIPID PANEL W/O CHOL/HDL RATIO
Cholesterol, Total: 204 mg/dL — ABNORMAL HIGH (ref 100–199)
HDL: 93 mg/dL (ref >39)
LDL Chol Calc (NIH): 90 mg/dL (ref 0–99)
Triglycerides: 121 mg/dL (ref 0–149)
VLDL Cholesterol Cal: 21 mg/dL (ref 5–40)

## 2020-10-18 LAB — TSH: TSH: 3.44 u[IU]/mL (ref 0.450–4.500)

## 2020-10-18 LAB — MAGNESIUM: Magnesium: 1.7 mg/dL (ref 1.6–2.3)

## 2020-10-18 LAB — VITAMIN B12: Vitamin B-12: 345 pg/mL (ref 232–1245)

## 2020-10-18 LAB — VITAMIN D 25 HYDROXY (VIT D DEFICIENCY, FRACTURES): Vit D, 25-Hydroxy: 56.4 ng/mL (ref 30.0–100.0)

## 2020-10-18 MED ORDER — MUPIROCIN 2 % EX OINT: 1.0000 "application " | TOPICAL_OINTMENT | Freq: Two times a day (BID) | CUTANEOUS | 2 refills | Status: DC

## 2020-10-18 NOTE — Progress Notes (Signed)
Contacted via MyChart   Good evening [name], your labs have returned: - CBC shows no anemia - Glucose, sugar, was mildly elevated on labs at 113.  We will continue to monitor this, cut back on sugar filled foods and carbs.  Kidney function, creatinine and eGFR, and liver function, AST and ALT, are normal. - Cholesterol levels remain stable with statin use. - Thyroid lab is normal.  Vitamin D level normal -- continue supplement. - Vitamin B12 level is on lower side of normal, recommend starting supplement 500 MCG daily to help improve this. - Magnesium level is normal.  Any questions? Keep being awesome!!  Thank you for allowing me to participate in your care.  I appreciate you. Kindest regards, Alzina Golda

## 2020-10-18 NOTE — Telephone Encounter (Signed)
Pharmacy sent a fax regarding the Mupirocin prescription. We wrote for cream but insurance prefers the ointment. Can we send in a new prescription please?

## 2020-10-20 ENCOUNTER — Encounter: Payer: Self-pay | Admitting: Ophthalmology

## 2020-10-23 ENCOUNTER — Encounter: Payer: Self-pay | Admitting: Ophthalmology

## 2020-10-27 ENCOUNTER — Telehealth: Payer: Self-pay

## 2020-10-27 NOTE — Telephone Encounter (Signed)
Pt was last seen 5/31. Please advise.  Copied from CRM [id number]. Topic: General - Other >> Oct 27, 2020 11:19 AM Pawlus, Monica A wrote: Reason for CRM: Pt stated she was just in the office last week but is still not feeling well. Pt stated she still has a lot of congestion and phlegm, please advise.

## 2020-10-27 NOTE — Telephone Encounter (Signed)
Called pt advised of [name]'s message scheduled phone call for monday

## 2020-10-27 NOTE — Telephone Encounter (Signed)
No availability today is it ok to wait until Monday?

## 2020-10-30 ENCOUNTER — Encounter: Payer: Self-pay | Admitting: Nurse Practitioner

## 2020-10-30 ENCOUNTER — Ambulatory Visit: Payer: Medicare PPO | Admitting: Nurse Practitioner

## 2020-10-30 DIAGNOSIS — R059 Cough, unspecified: Secondary | ICD-10-CM

## 2020-10-30 MED ORDER — GUAIFENESIN-CODEINE 100-10 MG/5ML PO SOLN: 5.0000 mL | Freq: Two times a day (BID) | ORAL | 0 refills | Status: DC | PRN

## 2020-10-30 MED ORDER — DOXYCYCLINE HYCLATE 100 MG PO TABS: 100.0000 mg | ORAL_TABLET | Freq: Two times a day (BID) | ORAL | 0 refills | Status: AC

## 2020-10-30 MED ORDER — METHYLPREDNISOLONE 4 MG PO TBPK: ORAL_TABLET | ORAL | 0 refills | Status: DC

## 2020-10-30 NOTE — Patient Instructions (Signed)

## 2020-10-30 NOTE — Assessment & Plan Note (Signed)
Acute with symptoms of sinus and cough starting 10/23/20.  Negative home Covid testing on Friday.  Has not been vaccinated and refuses this.  At this time will treated with Doxycycline 100 MG BID x 7 days, take with probiotic tablet to help with GI symptoms.  Steroid pack sent in for cough and wheezing + cough medication.  Recommend: - Increased rest - Increasing Fluids - Acetaminophen as needed for fever/pain.  - Salt water gargling, chloraseptic spray and throat lozenges - Mucinex.  - Saline sinus flushes or a neti pot.  - Humidifying the air Return to office in 10 days for lung check.

## 2020-10-30 NOTE — Progress Notes (Signed)
BP (!) 149/70   Pulse (!) 59   LMP  (LMP Unknown)   SpO2 97%    Subjective:    Patient ID: [name], female    DOB: 08-18-1940, 80 y.o.   MRN: [id number]  HPI: [name] is a 80 y.o. female  Chief Complaint  Patient presents with   Nasal Congestion   Cough   Sore Throat    Patient states she became symptomatic on Wednesday and states it started with a sore throat. Patient states she has been taking Mucinex Cold and Flu. Patient states she is having real bad cough. Patient states her phlegm has a discoloration to it when she coughs it up. Patient states she has taken an at-home covid test on Friday and it was negative. Patient is unaware if she had a fever, but states she has had chills.    Chills    This visit was completed via telephone due to the restrictions of the COVID-19 pandemic. All issues as above were discussed and addressed but no physical exam was performed. If it was felt that the patient should be evaluated in the office, they were directed there. The patient verbally consented to this visit. Patient was unable to complete an audio/visual visit due to Technical difficulties", "Lack of internet. Due to the catastrophic nature of the COVID-19 pandemic, this visit was done through audio contact only. Location of the patient: home Location of the provider: work Those involved with this call:  Provider: [name], DNP CMA: Irena Reichmann, Caroline Desk/Registration: Roe Rutherford  Time spent on call:  21 minutes on the phone discussing health concerns. 15 minutes total spent in review of patient's record and preparation of their chart.  I verified patient identity using two factors (patient name and date of birth). Patient consents verbally to being seen via telemedicine visit today.     UPPER RESPIRATORY TRACT INFECTION Presents today as not feeling well, started with symptoms on 10/23/20, sore throat -- then turned into congestion. Did Covid testing at home and  was negative -- Friday night.  Her grandson had been sick and around her.  Has not had Covid vaccinations.  First week of January 2022 had Covid.  States Tessalon pills do not work for her. Fever: no Cough: yes Shortness of breath: no Wheezing: yes Chest pain: no Chest tightness: yes Chest congestion: yes Nasal congestion: yes Runny nose: no Post nasal drip: no Sneezing: no Sore throat: no Swollen glands: no Sinus pressure: yes Headache: yes Face pain: no Toothache: no Ear pain: yes bilateral Ear pressure: yes bilateral Eyes red/itching:no Eye drainage/crusting: no  Vomiting: no Rash: no Fatigue: yes Sick contacts: yes Strep contacts: no  Context: fluctuating Recurrent sinusitis: no Relief with OTC cold/cough medications: yes  Treatments attempted: cold/sinus and mucinex    Relevant past medical, surgical, family and social history reviewed and updated as indicated. Interim medical history since our last visit reviewed. Allergies and medications reviewed and updated.  Review of Systems  Constitutional:  Positive for fatigue and fever. Negative for activity change and appetite change.  HENT:  Positive for congestion, ear pain, sinus pressure and sinus pain. Negative for ear discharge, facial swelling, postnasal drip, rhinorrhea, sneezing, sore throat and voice change.   Eyes:  Negative for pain and visual disturbance.  Respiratory:  Positive for cough, chest tightness and wheezing. Negative for shortness of breath.   Cardiovascular:  Negative for chest pain, palpitations and leg swelling.  Gastrointestinal: Negative.  Endocrine: Negative.   Musculoskeletal:  Positive for myalgias.  Neurological:  Positive for headaches. Negative for dizziness and numbness.  Psychiatric/Behavioral: Negative.     Per HPI unless specifically indicated above     Objective:    BP (!) 149/70   Pulse (!) 59   LMP  (LMP Unknown)   SpO2 97%   Wt Readings from Last 3 Encounters:   07/12/20 (!) 320 lb (145.2 kg)  04/17/20 (!) 333 lb (151 kg)  06/07/19 (!) 333 lb (151 kg)    Physical Exam  Unable to assess due to telephone visit only.  Results for orders placed or performed in visit on 10/17/20  CBC with Differential/Platelet  Result Value Ref Range   WBC 4.6 3.4 - 10.8 x10E3/uL   RBC 4.83 3.77 - 5.28 x10E6/uL   Hemoglobin 14.6 11.1 - 15.9 g/dL   Hematocrit 43.7 34.0 - 46.6 %   MCV 91 79 - 97 fL   MCH 30.2 26.6 - 33.0 pg   MCHC 33.4 31.5 - 35.7 g/dL   RDW 12.7 11.7 - 15.4 %   Platelets 258 150 - 450 x10E3/uL   Neutrophils 54 Not Estab. %   Lymphs 35 Not Estab. %   Monocytes 10 Not Estab. %   Eos 1 Not Estab. %   Basos 0 Not Estab. %   Neutrophils Absolute 2.5 1.4 - 7.0 x10E3/uL   Lymphocytes Absolute 1.6 0.7 - 3.1 x10E3/uL   Monocytes Absolute 0.4 0.1 - 0.9 x10E3/uL   EOS (ABSOLUTE) 0.0 0.0 - 0.4 x10E3/uL   Basophils Absolute 0.0 0.0 - 0.2 x10E3/uL   Immature Granulocytes 0 Not Estab. %   Immature Grans (Abs) 0.0 0.0 - 0.1 x10E3/uL  Comprehensive metabolic panel  Result Value Ref Range   Glucose 113 (H) 65 - 99 mg/dL   BUN 17 8 - 27 mg/dL   Creatinine, Ser 0.87 0.57 - 1.00 mg/dL   eGFR 67 >59 mL/min/1.73   BUN/Creatinine Ratio 20 12 - 28   Sodium 140 134 - 144 mmol/L   Potassium 4.6 3.5 - 5.2 mmol/L   Chloride 107 (H) 96 - 106 mmol/L   CO2 18 (L) 20 - 29 mmol/L   Calcium 10.3 8.7 - 10.3 mg/dL   Total Protein 6.7 6.0 - 8.5 g/dL   Albumin 4.3 3.7 - 4.7 g/dL   Globulin, Total 2.4 1.5 - 4.5 g/dL   Albumin/Globulin Ratio 1.8 1.2 - 2.2   Bilirubin Total 0.2 0.0 - 1.2 mg/dL   Alkaline Phosphatase 92 44 - 121 IU/L   AST 17 0 - 40 IU/L   ALT 14 0 - 32 IU/L  Lipid Panel w/o Chol/HDL Ratio  Result Value Ref Range   Cholesterol, Total 204 (H) 100 - 199 mg/dL   Triglycerides 121 0 - 149 mg/dL   HDL 93 >39 mg/dL   VLDL Cholesterol Cal 21 5 - 40 mg/dL   LDL Chol Calc (NIH) 90 0 - 99 mg/dL  TSH  Result Value Ref Range   TSH 3.440 0.450 - 4.500  uIU/mL  VITAMIN D 25 Hydroxy (Vit-D Deficiency, Fractures)  Result Value Ref Range   Vit D, 25-Hydroxy 56.4 30.0 - 100.0 ng/mL  Vitamin B12  Result Value Ref Range   Vitamin B-12 345 232 - 1,245 pg/mL  Magnesium  Result Value Ref Range   Magnesium 1.7 1.6 - 2.3 mg/dL      Assessment & Plan:   Problem List Items Addressed This Visit  Other   Cough    Acute with symptoms of sinus and cough starting 10/23/20.  Negative home Covid testing on Friday.  Has not been vaccinated and refuses this.  At this time will treated with Doxycycline 100 MG BID x 7 days, take with probiotic tablet to help with GI symptoms.  Steroid pack sent in for cough and wheezing + cough medication.  Recommend: - Increased rest - Increasing Fluids - Acetaminophen as needed for fever/pain.  - Salt water gargling, chloraseptic spray and throat lozenges - Mucinex.  - Saline sinus flushes or a neti pot.  - Humidifying the air Return to office in 10 days for lung check.       I discussed the assessment and treatment plan with the patient. The patient was provided an opportunity to ask questions and all were answered. The patient agreed with the plan and demonstrated an understanding of the instructions.   The patient was advised to call back or seek an in-person evaluation if the symptoms worsen or if the condition fails to improve as anticipated.   I provided 21+ minutes of time during this encounter.   Follow up plan: Return in about 10 days (around 11/09/2020) for Sinus follow-up.

## 2020-10-30 NOTE — Progress Notes (Signed)
Pt stated she would call to make this apt.  

## 2020-11-03 ENCOUNTER — Encounter: Admission: RE | Payer: Self-pay | Source: Home / Self Care

## 2020-11-03 ENCOUNTER — Ambulatory Visit: Admission: RE | Admit: 2020-11-03 | Payer: Medicare PPO | Source: Home / Self Care | Admitting: Ophthalmology

## 2020-11-03 ENCOUNTER — Other Ambulatory Visit: Payer: Self-pay | Admitting: Nurse Practitioner

## 2020-11-03 SURGERY — PHACOEMULSIFICATION, CATARACT, WITH IOL INSERTION
Anesthesia: Topical | Laterality: Right

## 2020-11-03 NOTE — Telephone Encounter (Signed)
  Notes to clinic: this script has expired  Review for continued use and refill    Requested Prescriptions  Pending Prescriptions Disp Refills   buPROPion (WELLBUTRIN SR) 150 MG 12 hr tablet [Pharmacy Med Name: BUPROPION SR 150MG  TABLETS (12 H)] 180 tablet 2    Sig: TAKE 1 TABLET(150 MG) BY MOUTH TWICE DAILY      Psychiatry: Antidepressants - bupropion Failed - 11/03/2020 12:41 PM      Failed - Last BP in normal range    BP Readings from Last 1 Encounters:  10/30/20 (!) 149/70          Passed - Completed PHQ-2 or PHQ-9 in the last 360 days      Passed - Valid encounter within last 6 months    Recent Outpatient Visits           4 days ago Cough   [facility] [place], [name] T, NP   2 weeks ago Recurrent major depressive disorder, in remission (HCC)   [facility] [place], [place], NP   5 months ago COVID-19   [facility], [place], DO   7 months ago Constipation, unspecified constipation type   [facility], NP   1 year ago Recurrent major depressive disorder, in remission [facility])   [facility] [place], [place], NP       Future Appointments             In 3 weeks Gollan, Dorie Rank, MD [facility] [place]   In 5 months  [facility]

## 2020-11-08 ENCOUNTER — Other Ambulatory Visit: Payer: Self-pay | Admitting: Nurse Practitioner

## 2020-11-08 ENCOUNTER — Other Ambulatory Visit: Payer: Self-pay

## 2020-11-08 DIAGNOSIS — J4520 Mild intermittent asthma, uncomplicated: Secondary | ICD-10-CM

## 2020-11-08 MED ORDER — BUPROPION HCL ER (SR) 150 MG PO TB12: 150.0000 mg | ORAL_TABLET | Freq: Two times a day (BID) | ORAL | 4 refills | Status: DC

## 2020-11-08 MED ORDER — SPIRIVA RESPIMAT 1.25 MCG/ACT IN AERS: 2.0000 | INHALATION_SPRAY | Freq: Every day | RESPIRATORY_TRACT | 3 refills | Status: DC

## 2020-11-08 NOTE — Telephone Encounter (Signed)
Patient called stating her pharmacy did not received her prescription for Wellbutrin SR on 11/03/20. I spoke with pharmacy and was informed they did not received.

## 2020-11-08 NOTE — Telephone Encounter (Signed)
Copied from CRM [id number]. Topic: Quick Communication - Rx Refill/Question >> Nov 08, 2020 11:05 AM Jaquita Rector A wrote: Medication: Tiotropium Bromide Monohydrate (SPIRIVA RESPIMAT) 1.25 MCG/ACT AERS   Has the patient contacted their pharmacy? Yes.   (Agent: If no, request that the patient contact the pharmacy for the refill.) (Agent: If yes, when and what did the pharmacy advise?)  Preferred Pharmacy (with phone number or street name): [id number] [place], [place] - [address] AT [facility] OF [address] [facility]  Phone:  [phone] Fax:  [phone]     Agent: Please be advised that RX refills may take up to 3 business days. We ask that you follow-up with your pharmacy.

## 2020-11-08 NOTE — Telephone Encounter (Signed)
Would pt need apt? 

## 2020-11-29 ENCOUNTER — Encounter: Payer: Self-pay | Admitting: Cardiovascular Disease

## 2020-11-29 ENCOUNTER — Ambulatory Visit: Payer: Medicare PPO | Admitting: Cardiovascular Disease

## 2020-11-29 ENCOUNTER — Other Ambulatory Visit: Payer: Self-pay

## 2020-11-29 VITALS — BP 146/77 | HR 64 | Ht 63.5 in | Wt 340.0 lb

## 2020-11-29 DIAGNOSIS — E782 Mixed hyperlipidemia: Secondary | ICD-10-CM

## 2020-11-29 DIAGNOSIS — I10 Essential (primary) hypertension: Secondary | ICD-10-CM | POA: Diagnosis not present

## 2020-11-29 DIAGNOSIS — I700 Atherosclerosis of aorta: Secondary | ICD-10-CM

## 2020-11-29 DIAGNOSIS — J4541 Moderate persistent asthma with (acute) exacerbation: Secondary | ICD-10-CM

## 2020-11-29 DIAGNOSIS — I890 Lymphedema, not elsewhere classified: Secondary | ICD-10-CM

## 2020-11-29 NOTE — Patient Instructions (Addendum)
Please use lymphedema Check blood pressure at home  Medication Instructions:  No changes  PMD (primary care provider) has you on bystolic 5 mg daily  If you need a refill on your cardiac medications before your next appointment, please call your pharmacy.   Lab work: No new labs needed  Testing/Procedures: No new testing needed   Follow-Up: At [facility], you and your health needs are our priority.  As part of our continuing mission to provide you with exceptional heart care, we have created designated Provider Care Teams.  These Care Teams include your primary Cardiologist (physician) and Advanced Practice Providers (APPs -  Physician Assistants and Nurse Practitioners) who all work together to provide you with the care you need, when you need it.  You will need a follow up appointment in 12 months  Providers on your designated Care Team:   Nicolasa Ducking, NP Eula Listen, PA-C Marisue Ivan, PA-C [place], [place]  COVID-19 Vaccine Information can be found at: [email or web address] For questions related to vaccine distribution or appointments, please email [email or web address] or call [phone].   Please monitor blood pressures and keep a log of your readings.   How to use a home blood pressure monitor. Be still. Measure at the same time every day. It's important to take the readings at the same time each day, such as morning and evening. Take reading approximately 1 1/2 to 2 hours after BP medications.   AVOID these things for 30 minutes before checking your blood pressure: Drinking caffeine. Drinking alcohol. Eating. Smoking. Exercising.

## 2020-11-29 NOTE — Progress Notes (Signed)
Date:  11/29/2020   ID:  [name], DOB [birth year], MRN [id number]  Patient Location:  [address] [place] [address]   Provider location:   [facility], [place] office  PCP:  Marjie Skiff, NP  Cardiologist:  Fonnie Mu  Chief Complaint  Patient presents with   12 month follow up     Patient c/o LE edema. Medications reviewed by the patient verbally.   Home hey there  History of Present Illness:    [name] is a 80 y.o. female past medical history of Morbid obesity Chronic Leg edema Lymphedema HTN Non smoker Severe sleep apnea, followed by pulmonary Extensive secondhand smoke exposure for approximately 60 years Presents for follow-up of her aortic atherosclerosis  Last seen March 2018 Tele visit 01/2019 Retired in 2020/07/06Husband passed away one year ago  Family moved in,  Grandchildren age 80 and 41  Does lymph massage Does not use compression pumps  Arthritis, limited ability  On lasix  As needed Lymphedema, arms and legs  On nutrisystem, water Eats 2x a day  Has access to pool, does not use it No exercise  Labs reviewed Total 204, LDL 90  EKG personally reviewed by myself on todays visit Normal sinus rhythm rate 64 bpm no significant ST-T wave changes    Past Medical History:  Diagnosis Date   Allergy    Anemia    vitamin d deficiency   Arthritis    Asthma    Chronic fatigue syndrome    COPD (chronic obstructive pulmonary disease) (HCC)    COVID 05/2020   Diverticulosis    Dyspnea    Fractured fibula    GERD (gastroesophageal reflux disease)    Gout    Headache    migraines   History of blood transfusion    Hypertension    Lymphedema 2004   Neuromuscular disorder (HCC)    Pneumonia    Shingles    Sleep apnea    cpap   Past Surgical History:  Procedure Laterality Date   ABDOMINAL HYSTERECTOMY  1974   BIOPSY THYROID     CATARACT EXTRACTION W/PHACO Left 07/27/2020   Procedure:  CATARACT EXTRACTION PHACO AND INTRAOCULAR LENS PLACEMENT (IOC) LEFT DIABETIC;  Surgeon: Nevada Crane, MD;  Location: [facility] ORS;  Service: Ophthalmology;  Laterality: Left;  Lot #[id number] H [place]; 01:04.8 CDE: 8.86   CHOLECYSTECTOMY  2005     Allergies:   Benicar [olmesartan], Latex, Ace inhibitors, Ciprofloxacin, Diovan [valsartan], Flagyl [metronidazole], Lotensin [benazepril hcl], Penicillins, and Sulfa antibiotics   Social History   Tobacco Use   Smoking status: Never   Smokeless tobacco: Never  Vaping Use   Vaping Use: Never used  Substance Use Topics   Alcohol use: No    Alcohol/week: 0.0 standard drinks   Drug use: No     Current Outpatient Medications on File Prior to Visit  Medication Sig Dispense Refill   albuterol (VENTOLIN HFA) 108 (90 Base) MCG/ACT inhaler Inhale 2 puffs into the lungs every 6 (six) hours as needed for wheezing or shortness of breath. 8 g 2   Apple Cider Vinegar 500 MG TABS Take 500 mg by mouth with breakfast, with lunch, and with evening meal.     Ascorbic Acid (VITAMIN C) 1000 MG tablet Take 1,000 mg by mouth daily.     aspirin EC 81 MG tablet Take 81 mg by mouth every evening. Swallow whole.  atorvastatin (LIPITOR) 10 MG tablet Take 1 tablet (10 mg total) by mouth every evening. 90 tablet 4   buPROPion (WELLBUTRIN SR) 150 MG 12 hr tablet Take 1 tablet (150 mg total) by mouth 2 (two) times daily. 180 tablet 4   Cholecalciferol (VITAMIN D3) 125 MCG (5000 UT) TABS Take 5,000 Units by mouth in the morning and at bedtime. Morning & afternoon     docusate sodium (COLACE) 100 MG capsule Take 100 mg by mouth 2 (two) times daily as needed (constipation).     furosemide (LASIX) 40 MG tablet TAKE 1 TABLET(40 MG) BY MOUTH DAILY AS NEEDED 90 tablet 4   Glucosamine-Chondroitin (MOVE FREE PO) Take 1 tablet by mouth in the morning and at bedtime. After lunch & evening     guaiFENesin-codeine 100-10 MG/5ML syrup Take 5 mLs by mouth 2 (two) times daily as needed for  cough. 120 mL 0   irbesartan (AVAPRO) 300 MG tablet TAKE 1 TABLET(300 MG) BY MOUTH DAILY 90 tablet 4   magnesium oxide (MAG-OX) 400 MG tablet Take 400 mg by mouth 2 (two) times a day. After lunch & 1700     methylPREDNISolone (MEDROL DOSEPAK) 4 MG TBPK tablet Take as instructed on package. 21 tablet 0   mupirocin cream (BACTROBAN) 2 % Apply 1 application topically 2 (two) times daily as needed (skin wounds). 15 g 4   nebivolol (BYSTOLIC) 5 MG tablet TAKE 1/2 TABLET(2.5 MG) BY MOUTH DAILY 30 tablet 1   omeprazole (PRILOSEC) 20 MG capsule TAKE 1 CAPSULE(20 MG) BY MOUTH DAILY 90 capsule 4   QUERCETIN PO Take 250 mg by mouth daily in the afternoon.     Tiotropium Bromide Monohydrate (SPIRIVA RESPIMAT) 1.25 MCG/ACT AERS Inhale 2 puffs into the lungs daily. 4 g 3   traZODone (DESYREL) 50 MG tablet TAKE 1 TABLET(50 MG) BY MOUTH AT BEDTIME AS NEEDED FOR SLEEP 90 tablet 4   triamcinolone cream (KENALOG) 0.1 % APPLY TOPICALLY TO THE AFFECTED AREA TWICE DAILY 454 g 5   TURMERIC CURCUMIN PO Take 1 tablet by mouth daily.     vitamin B-12 (CYANOCOBALAMIN) 500 MCG tablet Take 500 mcg by mouth daily.     vitamin E 400 UNIT capsule Take 400 Units by mouth daily.     WIXELA INHUB 250-50 MCG/ACT AEPB INHALE 1 PUFF INTO THE LUNGS TWICE DAILY 60 each 1   mupirocin ointment (BACTROBAN) 2 % Apply 1 application topically 2 (two) times daily. (Patient not taking: Reported on 11/29/2020) 22 g 2   No current facility-administered medications on file prior to visit.     Family Hx: The patient's family history includes Alcoholism in her father; Cancer in her brother; Diabetes in her father; Heart disease in her mother; Hyperlipidemia in her mother; Hypertension in her son; Stroke in her maternal grandmother.  ROS:   Please see the history of present illness.    Review of Systems  Constitutional: Negative.   HENT: Negative.    Respiratory: Negative.    Cardiovascular:  Positive for leg swelling.  Gastrointestinal:  Negative.   Musculoskeletal: Negative.   Neurological: Negative.   Psychiatric/Behavioral: Negative.    All other systems reviewed and are negative.   Labs/Other Tests and Data Reviewed:    Recent Labs: 10/17/2020: ALT 14; BUN 17; Creatinine, Ser 0.87; Hemoglobin 14.6; Magnesium 1.7; Platelets 258; Potassium 4.6; Sodium 140; TSH 3.440   Recent Lipid Panel Lab Results  Component Value Date/Time   CHOL 204 (H) 10/17/2020 04:42 PM  CHOL 213 (H) 02/12/2017 09:39 AM   TRIG 121 10/17/2020 04:42 PM   TRIG 115 02/12/2017 09:39 AM   HDL 93 10/17/2020 04:42 PM   CHOLHDL 2.4 03/09/2018 11:42 AM   LDLCALC 90 10/17/2020 04:42 PM    Wt Readings from Last 3 Encounters:  11/29/20 (!) 340 lb (154.2 kg)  07/12/20 (!) 320 lb (145.2 kg)  04/17/20 (!) 333 lb (151 kg)     Exam:    Vital Signs: Vital signs may also be detailed in the HPI BP (!) 146/77 (BP Location: Left Wrist, Patient Position: Sitting, Cuff Size: Normal)   Pulse 64   Ht 5' 3.5" (1.613 m)   Wt (!) 340 lb (154.2 kg) Comment: at home weight; unable to stand today  LMP  (LMP Unknown)   SpO2 98%   BMI 59.28 kg/m  Constitutional: Obese, oriented to person, place, and time. No distress.  HENT:  Head: Grossly normal Eyes:  no discharge. No scleral icterus.  Neck: No JVD, no carotid bruits  Cardiovascular: Regular rate and rhythm, no murmurs appreciated Pulmonary/Chest: Clear to auscultation bilaterally, no wheezes or rails Abdominal: Soft.  no distension.  no tenderness.  Musculoskeletal: Normal range of motion Neurological:  normal muscle tone. Coordination normal. No atrophy Skin: Skin warm and dry Psychiatric: normal affect, pleasant   ASSESSMENT & PLAN:    Problem List Items Addressed This Visit       Cardiology Problems   Essential hypertension, benign   Relevant Orders   EKG 12-Lead   Aortic atherosclerosis (HCC) - Primary   Relevant Orders   EKG 12-Lead   Hyperlipidemia     Other   Lymphedema   Asthma    Lymphedema: She continues her lymphedema massage Recommended weight loss, use her lymphedema pumps Family can help with the pumps in place  Hypertension Blood pressure elevated today in the office but better controlled at home Recommend she continue to monitor pressures and call our office if it continues to run high Not taking her Lasix which might help  Aortic atherosclerosis Recommend she continue taking her Lipitor  Morbid obesity We have encouraged  careful diet management in an effort to lose weight. Not exercising but daily water aerobics, water walking   Total encounter time more than 25 minutes  Greater than 50% was spent in counseling and coordination of care with the patient   Signed, Julien Nordmann, MD  [facility] [address], [place], [place] [address]

## 2020-12-05 DIAGNOSIS — G4733 Obstructive sleep apnea (adult) (pediatric): Secondary | ICD-10-CM | POA: Diagnosis not present

## 2020-12-19 DIAGNOSIS — L821 Other seborrheic keratosis: Secondary | ICD-10-CM | POA: Diagnosis not present

## 2020-12-19 DIAGNOSIS — B353 Tinea pedis: Secondary | ICD-10-CM | POA: Diagnosis not present

## 2020-12-19 DIAGNOSIS — L918 Other hypertrophic disorders of the skin: Secondary | ICD-10-CM | POA: Diagnosis not present

## 2020-12-20 ENCOUNTER — Ambulatory Visit: Payer: Medicare PPO | Admitting: Internal Medicine

## 2020-12-21 DIAGNOSIS — G4733 Obstructive sleep apnea (adult) (pediatric): Secondary | ICD-10-CM | POA: Diagnosis not present

## 2021-01-08 ENCOUNTER — Encounter: Payer: Self-pay | Admitting: Nurse Practitioner

## 2021-01-08 ENCOUNTER — Ambulatory Visit (INDEPENDENT_AMBULATORY_CARE_PROVIDER_SITE_OTHER): Payer: Medicare PPO | Admitting: Nurse Practitioner

## 2021-01-08 VITALS — Temp 97.2°F

## 2021-01-08 DIAGNOSIS — I890 Lymphedema, not elsewhere classified: Secondary | ICD-10-CM | POA: Diagnosis not present

## 2021-01-08 DIAGNOSIS — L03116 Cellulitis of left lower limb: Secondary | ICD-10-CM | POA: Diagnosis not present

## 2021-01-08 MED ORDER — MUPIROCIN 2 % EX OINT: 1.0000 "application " | TOPICAL_OINTMENT | Freq: Two times a day (BID) | CUTANEOUS | 2 refills | Status: AC

## 2021-01-08 MED ORDER — DOXYCYCLINE HYCLATE 100 MG PO TABS: 100.0000 mg | ORAL_TABLET | Freq: Two times a day (BID) | ORAL | 0 refills | Status: AC

## 2021-01-08 NOTE — Progress Notes (Signed)
Temp (!) 97.2 F (36.2 C) (Oral)   LMP  (LMP Unknown)   SpO2 98%    Subjective:    Patient ID: [name], female    DOB: 1940-11-16, 80 y.o.   MRN: [id number]  HPI: [name] is a 80 y.o. female  Chief Complaint  Patient presents with   Cellulitis    Patient states she noticed a flare up last Thursday on the back of both of her thighs. Patient denies having any drainage and states she is having inflammation and real bad itchiness. Patient denies taking any medication for the area.    This visit was completed via telephone due to the restrictions of the COVID-19 pandemic. All issues as above were discussed and addressed but no physical exam was performed. If it was felt that the patient should be evaluated in the office, they were directed there. The patient verbally consented to this visit. Patient was unable to complete an audio/visual visit due to Lack of equipment. Due to the catastrophic nature of the COVID-19 pandemic, this visit was done through audio contact only. Location of the patient: home Location of the provider: work Those involved with this call:  Provider: [name], DNP CMA: Irena Reichmann, [place] Desk/Registration: Barth Kirks  Time spent on call:  21 minutes on the phone discussing health concerns. 15 minutes total spent in review of patient's record and preparation of their chart.  I verified patient identity using two factors (patient name and date of birth). Patient consents verbally to being seen via telemedicine visit today.    SKIN INFECTION She reports having continuous itching to back of both legs, like when she had cellulitis before.  There is some redness in spots.  No drainage or blisters.  Denies pain.  Has had infections in past, not in several months.  Doxycycline has offered benefit in past.  Denies skin breakdown.  Has underlying lymphedema. Duration:  started last Thursday Location: back of both legs History of trauma in area: no Pain:  no Quality: no Severity:  0/10 Redness: no Swelling: no Oozing: no Pus: no Fevers: no Nausea/vomiting: no Status: stable Treatments attempted: cream   Tetanus: UTD   Relevant past medical, surgical, family and social history reviewed and updated as indicated. Interim medical history since our last visit reviewed. Allergies and medications reviewed and updated.  Review of Systems  Constitutional:  Negative for activity change, appetite change, diaphoresis, fatigue and fever.  Respiratory:  Negative for cough, chest tightness and shortness of breath.   Cardiovascular:  Negative for chest pain, palpitations and leg swelling.  Skin:  Positive for wound.  Neurological: Negative.   Psychiatric/Behavioral: Negative.     Per HPI unless specifically indicated above     Objective:    Temp (!) 97.2 F (36.2 C) (Oral)   LMP  (LMP Unknown)   SpO2 98%   Wt Readings from Last 3 Encounters:  11/29/20 (!) 340 lb (154.2 kg)  07/12/20 (!) 320 lb (145.2 kg)  04/17/20 (!) 333 lb (151 kg)    Physical Exam  Unable to attain due to telephone visit only, patient with no access.  Results for orders placed or performed in visit on 10/17/20  CBC with Differential/Platelet  Result Value Ref Range   WBC 4.6 3.4 - 10.8 x10E3/uL   RBC 4.83 3.77 - 5.28 x10E6/uL   Hemoglobin 14.6 11.1 - 15.9 g/dL   Hematocrit 43.7 34.0 - 46.6 %   MCV 91 79 - 97  fL   MCH 30.2 26.6 - 33.0 pg   MCHC 33.4 31.5 - 35.7 g/dL   RDW 12.7 11.7 - 15.4 %   Platelets 258 150 - 450 x10E3/uL   Neutrophils 54 Not Estab. %   Lymphs 35 Not Estab. %   Monocytes 10 Not Estab. %   Eos 1 Not Estab. %   Basos 0 Not Estab. %   Neutrophils Absolute 2.5 1.4 - 7.0 x10E3/uL   Lymphocytes Absolute 1.6 0.7 - 3.1 x10E3/uL   Monocytes Absolute 0.4 0.1 - 0.9 x10E3/uL   EOS (ABSOLUTE) 0.0 0.0 - 0.4 x10E3/uL   Basophils Absolute 0.0 0.0 - 0.2 x10E3/uL   Immature Granulocytes 0 Not Estab. %   Immature Grans (Abs) 0.0 0.0 - 0.1 x10E3/uL   Comprehensive metabolic panel  Result Value Ref Range   Glucose 113 (H) 65 - 99 mg/dL   BUN 17 8 - 27 mg/dL   Creatinine, Ser 0.87 0.57 - 1.00 mg/dL   eGFR 67 >59 mL/min/1.73   BUN/Creatinine Ratio 20 12 - 28   Sodium 140 134 - 144 mmol/L   Potassium 4.6 3.5 - 5.2 mmol/L   Chloride 107 (H) 96 - 106 mmol/L   CO2 18 (L) 20 - 29 mmol/L   Calcium 10.3 8.7 - 10.3 mg/dL   Total Protein 6.7 6.0 - 8.5 g/dL   Albumin 4.3 3.7 - 4.7 g/dL   Globulin, Total 2.4 1.5 - 4.5 g/dL   Albumin/Globulin Ratio 1.8 1.2 - 2.2   Bilirubin Total 0.2 0.0 - 1.2 mg/dL   Alkaline Phosphatase 92 44 - 121 IU/L   AST 17 0 - 40 IU/L   ALT 14 0 - 32 IU/L  Lipid Panel w/o Chol/HDL Ratio  Result Value Ref Range   Cholesterol, Total 204 (H) 100 - 199 mg/dL   Triglycerides 121 0 - 149 mg/dL   HDL 93 >39 mg/dL   VLDL Cholesterol Cal 21 5 - 40 mg/dL   LDL Chol Calc (NIH) 90 0 - 99 mg/dL  TSH  Result Value Ref Range   TSH 3.440 0.450 - 4.500 uIU/mL  VITAMIN D 25 Hydroxy (Vit-D Deficiency, Fractures)  Result Value Ref Range   Vit D, 25-Hydroxy 56.4 30.0 - 100.0 ng/mL  Vitamin B12  Result Value Ref Range   Vitamin B-12 345 232 - 1,245 pg/mL  Magnesium  Result Value Ref Range   Magnesium 1.7 1.6 - 2.3 mg/dL      Assessment & Plan:   Problem List Items Addressed This Visit       Other   Lymphedema    Chronic, ongoing.  Recommend use of Tylenol as needed for pain, max 3000 MG total daily dosing.  Avoid Ibuprofen or NSAID products.  Continue using compression wraps daily and massage therapy.  Will have her return to vascular as needed.      Cellulitis - Primary    Acute to posterior bilateral lower legs -- skin intact per patient.  At this time will treat with Doxycycline 100 MG BID x 7 days and Mupirocin ointment to apply to red areas.  Monitor closely for skin breakdown and if presents alert provider immediately.  Denies s/s infection. To monitor temperature and skin closely.  Return in 7 days for  follow-up.       I discussed the assessment and treatment plan with the patient. The patient was provided an opportunity to ask questions and all were answered. The patient agreed with the plan and demonstrated an understanding  of the instructions.   The patient was advised to call back or seek an in-person evaluation if the symptoms worsen or if the condition fails to improve as anticipated.   I provided 21+ minutes of time during this encounter.   Follow up plan: Return in about 1 week (around 01/15/2021) for Cellulitis follow-up.

## 2021-01-08 NOTE — Patient Instructions (Signed)
Cellulitis, Adult Cellulitis is a skin infection. The infected area is often warm, red, swollen, and sore. It occurs most often in the arms and lower legs. It is very important to get treated for this condition. What are the causes? This condition is caused by bacteria. The bacteria enter through a break in the skin, such as a cut, burn, insect bite, open sore, or crack. What increases the risk? This condition is more likely to occur in people who: Have a weak body defense system (immune system). Have open cuts, burns, bites, or scrapes on the skin. Are older than 80 years of age. Have a blood sugar problem (diabetes). Have a long-lasting (chronic) liver disease (cirrhosis) or kidney disease. Are very overweight (obese). Have a skin problem, such as: Itchy rash (eczema). Slow movement of blood in the veins (venous stasis). Fluid buildup below the skin (edema). Have been treated with high-energy rays (radiation). Use IV drugs. What are the signs or symptoms? Symptoms of this condition include: Skin that is: Red. Streaking. Spotting. Swollen. Sore or painful when you touch it. Warm. A fever. Chills. Blisters. How is this diagnosed? This condition is diagnosed based on: Medical history. Physical exam. Blood tests. Imaging tests. How is this treated? Treatment for this condition may include: Medicines to treat infections or allergies. Home care, such as: Rest. Placing cold or warm cloths (compresses) on the skin. Hospital care, if the condition is very bad. Follow these instructions at home: Medicines Take over-the-counter and prescription medicines only as told by your doctor. If you were prescribed an antibiotic medicine, take it as told by your doctor. Do not stop taking it even if you start to feel better. General instructions  Drink enough fluid to keep your pee (urine) pale yellow. Do not touch or rub the infected area. Raise (elevate) the infected area above the  level of your heart while you are sitting or lying down. Place cold or warm cloths on the area as told by your doctor. Keep all follow-up visits as told by your doctor. This is important. Contact a doctor if: You have a fever. You do not start to get better after 1-2 days of treatment. Your bone or joint under the infected area starts to hurt after the skin has healed. Your infection comes back. This can happen in the same area or another area. You have a swollen bump in the area. You have new symptoms. You feel ill and have muscle aches and pains. Get help right away if: Your symptoms get worse. You feel very sleepy. You throw up (vomit) or have watery poop (diarrhea) for a long time. You see red streaks coming from the area. Your red area gets larger. Your red area turns dark in color. These symptoms may represent a serious problem that is an emergency. Do not wait to see if the symptoms will go away. Get medical help right away. Call your local emergency services (911 in the [place] Do not drive yourself to the hospital. Summary Cellulitis is a skin infection. The area is often warm, red, swollen, and sore. This condition is treated with medicines, rest, and cold and warm cloths. Take all medicines only as told by your doctor. Tell your doctor if symptoms do not start to get better after 1-2 days of treatment. This information is not intended to replace advice given to you by your health care provider. Make sure you discuss any questions you have with your health care provider. Document Revised: 09/25/2017 Document Reviewed:   09/25/2017 Elsevier Patient Education  2022 Elsevier Inc.  

## 2021-01-08 NOTE — Assessment & Plan Note (Signed)
Chronic, ongoing.  Recommend use of Tylenol as needed for pain, max 3000 MG total daily dosing.  Avoid Ibuprofen or NSAID products.  Continue using compression wraps daily and massage therapy.  Will have her return to vascular as needed. 

## 2021-01-08 NOTE — Assessment & Plan Note (Signed)
Acute to posterior bilateral lower legs -- skin intact per patient.  At this time will treat with Doxycycline 100 MG BID x 7 days and Mupirocin ointment to apply to red areas.  Monitor closely for skin breakdown and if presents alert provider immediately.  Denies s/s infection. To monitor temperature and skin closely.  Return in 7 days for follow-up.

## 2021-01-15 ENCOUNTER — Ambulatory Visit: Payer: Medicare PPO | Admitting: Nurse Practitioner

## 2021-01-16 ENCOUNTER — Telehealth: Payer: Medicare PPO | Admitting: Nurse Practitioner

## 2021-01-25 ENCOUNTER — Ambulatory Visit: Payer: Medicare PPO | Admitting: Nurse Practitioner

## 2021-02-02 ENCOUNTER — Other Ambulatory Visit: Payer: Self-pay | Admitting: Unknown Physician Specialty

## 2021-02-09 IMAGING — DX DG CHEST 2V
3 series · 3 of 3 positions shown · non-contrast
Comparison: Chest radiograph dated 07/23/2017

CLINICAL DATA: 79-year-old female status post COVID and cough.

EXAM:
CHEST - 2 VIEW

[chest pa]
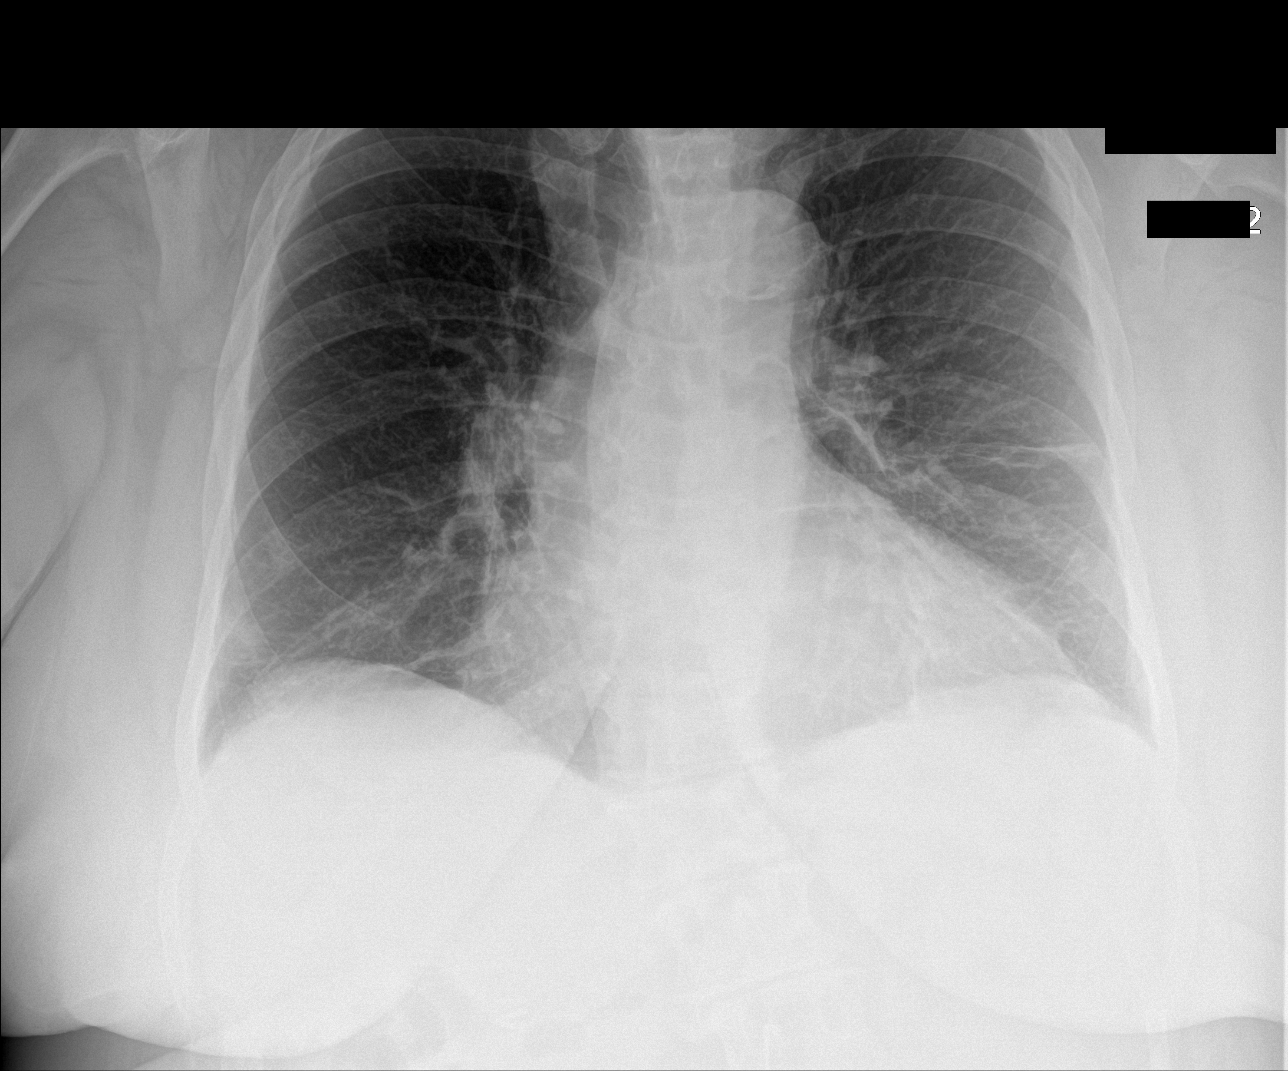

[chest lat]
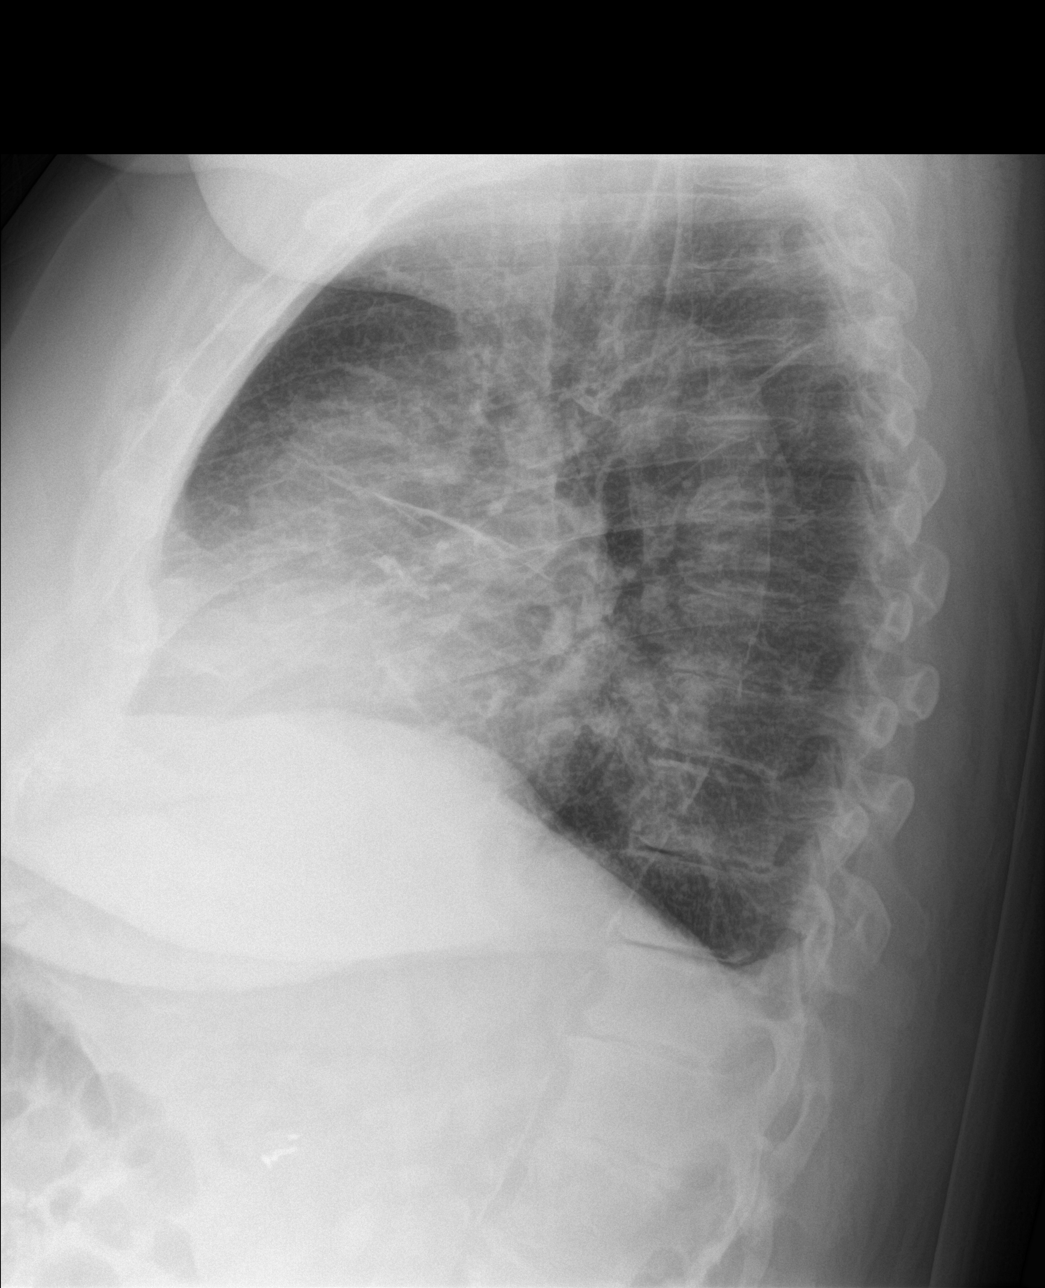

[chest ap]
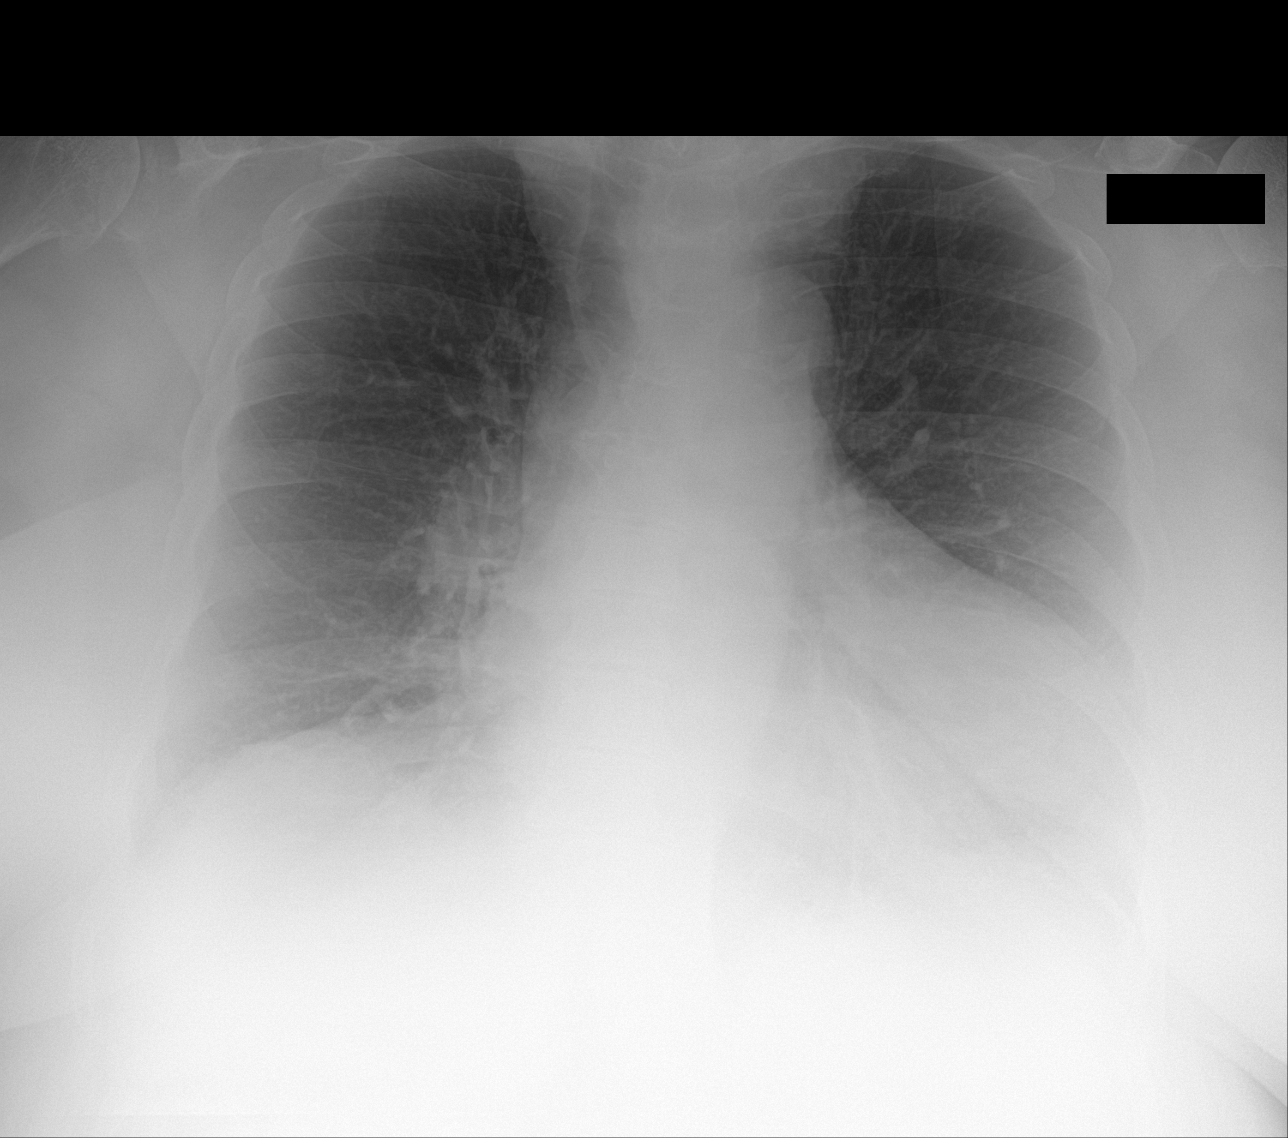

[3 of 3 positions shown; findings below may reference images not displayed]

FINDINGS: The bilateral mid to lower lung field linear and streaky
atelectasis/scarring as seen on the prior radiograph. Slight
increased lower pulmonary densities on the lateral view. No
consolidative changes. There is no pleural effusion pneumothorax.
Stable cardiac silhouette. Atherosclerotic calcification of the
aorta. No acute osseous pathology.
IMPRESSION: Bilateral mid to lower lung field atelectasis/scarring. No focal
consolidation.

## 2021-02-23 ENCOUNTER — Ambulatory Visit: Payer: Medicare PPO | Admitting: Nurse Practitioner

## 2021-03-07 DIAGNOSIS — G4733 Obstructive sleep apnea (adult) (pediatric): Secondary | ICD-10-CM | POA: Diagnosis not present

## 2021-03-12 ENCOUNTER — Telehealth: Payer: Medicare PPO

## 2021-03-12 ENCOUNTER — Ambulatory Visit: Payer: Self-pay

## 2021-03-12 MED ORDER — OSELTAMIVIR PHOSPHATE 75 MG PO CAPS: 75.0000 mg | ORAL_CAPSULE | Freq: Two times a day (BID) | ORAL | 0 refills | Status: AC

## 2021-03-12 NOTE — Telephone Encounter (Signed)
Please see below and advise.

## 2021-03-12 NOTE — Telephone Encounter (Signed)
Returned pt's call.  Pt's grand son who lives with pt was diagnosed with the flu yesterday. Pt started experiencing S/S today. Pt is taking OTC medications for S/S and is interested in Tamiflu.   Pt unable to get into the office d/t disability and transportation.  Pt unable to make an online appointment.       Reason for Disposition  [1] ILI SYMPTOMS (i.e., fever AND one or more other ILI symptoms) < 72 hours AND [2] patient is HIGH RISK (e.g., pregnant, HIV+, or chronic medical condition)  Answer Assessment - Initial Assessment Questions Always answer the Flu on Call ([employer] line as follows: "Thank you for calling Flu on Call ([employer] my name is _______________. I am a (Title; such as Charity fundraiser, Teacher, early years/pre, Proofreader, or Medic).  How can I help you?" flusyptoms  1. ONSET: "When did your flu symptoms start?" Today 2. COUGH: "How bad is the cough?"   3. RESPIRATORY DISTRESS: "Describe your breathing."  4. FEVER: "Do you have a fever?" If so, ask: "What is your temperature, how was it measured, and when did it start?" 5. EXPOSURE: "Were you exposed to someone with influenza?"   6. HIGH RISK DISEASE: "Do you any major health problems?" (e.g., heart or lung disease, asthma, weak immune system, or other HIGH RISK conditions) 7. PREGNANCY: "Is there any chance you are pregnant?" "When was your last menstrual period?" no 8. OTHER SYMPTOMS: "Do you have any other symptoms?"  (e.g., runny nose, muscle aches, headache, sore throat)HA  Protocols used: Influenza - CDC 2017 Flu on Call ([employer] [facility]

## 2021-03-12 NOTE — Addendum Note (Signed)
Addended by: Aura Dials T on: 03/12/2021 06:28 PM   Modules accepted: Orders

## 2021-03-12 NOTE — Telephone Encounter (Signed)
Routing to provider to advise.  

## 2021-03-13 NOTE — Telephone Encounter (Signed)
Patient notified

## 2021-03-14 ENCOUNTER — Telehealth: Payer: Self-pay | Admitting: Nurse Practitioner

## 2021-03-14 ENCOUNTER — Encounter: Payer: Self-pay | Admitting: Nurse Practitioner

## 2021-03-14 ENCOUNTER — Telehealth (INDEPENDENT_AMBULATORY_CARE_PROVIDER_SITE_OTHER): Payer: Medicare PPO | Admitting: Nurse Practitioner

## 2021-03-14 VITALS — BP 140/80

## 2021-03-14 DIAGNOSIS — Z20828 Contact with and (suspected) exposure to other viral communicable diseases: Secondary | ICD-10-CM | POA: Diagnosis not present

## 2021-03-14 MED ORDER — PREDNISONE 20 MG PO TABS: 40.0000 mg | ORAL_TABLET | Freq: Every day | ORAL | 0 refills | Status: AC

## 2021-03-14 MED ORDER — AZITHROMYCIN 250 MG PO TABS: ORAL_TABLET | ORAL | 0 refills | Status: AC

## 2021-03-14 MED ORDER — BENZONATATE 100 MG PO CAPS: 100.0000 mg | ORAL_CAPSULE | Freq: Three times a day (TID) | ORAL | 0 refills | Status: DC | PRN

## 2021-03-14 NOTE — Telephone Encounter (Signed)
Pt is scheduled today for a virtual visit with [name] at 2:20 pm

## 2021-03-14 NOTE — Telephone Encounter (Signed)
Routing to provider. Would you like for the patient to do a virtual visit?

## 2021-03-14 NOTE — Telephone Encounter (Signed)
Please call and schedule the patient a virtual visit ASAP per [name].

## 2021-03-14 NOTE — Patient Instructions (Signed)
Influenza, Adult °Influenza is also called "the flu." It is an infection in the lungs, nose, and throat (respiratory tract). It spreads easily from person to person (is contagious). The flu causes symptoms that are like a cold, along with high fever and body aches. °What are the causes? °This condition is caused by the influenza virus. You can get the virus by: °Breathing in droplets that are in the air after a person infected with the flu coughed or sneezed. °Touching something that has the virus on it and then touching your mouth, nose, or eyes. °What increases the risk? °Certain things may make you more likely to get the flu. These include: °Not washing your hands often. °Having close contact with many people during cold and flu season. °Touching your mouth, eyes, or nose without first washing your hands. °Not getting a flu shot every year. °You may have a higher risk for the flu, and serious problems, such as a lung infection (pneumonia), if you: °Are older than 65. °Are pregnant. °Have a weakened disease-fighting system (immune system) because of a disease or because you are taking certain medicines. °Have a long-term (chronic) condition, such as: °Heart, kidney, or lung disease. °Diabetes. °Asthma. °Have a liver disorder. °Are very overweight (morbidly obese). °Have anemia. °What are the signs or symptoms? °Symptoms usually begin suddenly and last 4-14 days. They may include: °Fever and chills. °Headaches, body aches, or muscle aches. °Sore throat. °Cough. °Runny or stuffy (congested) nose. °Feeling discomfort in your chest. °Not wanting to eat as much as normal. °Feeling weak or tired. °Feeling dizzy. °Feeling sick to your stomach or throwing up. °How is this treated? °If the flu is found early, you can be treated with antiviral medicine. This can help to reduce how bad the illness is and how long it lasts. This may be given by mouth or through an IV tube. °Taking care of yourself at home can help your  symptoms get better. Your doctor may want you to: °Take over-the-counter medicines. °Drink plenty of fluids. °The flu often goes away on its own. If you have very bad symptoms or other problems, you may be treated in a hospital. °Follow these instructions at home: °  °Activity °Rest as needed. Get plenty of sleep. °Stay home from work or school as told by your doctor. °Do not leave home until you do not have a fever for 24 hours without taking medicine. °Leave home only to go to your doctor. °Eating and drinking °Take an ORS (oral rehydration solution). This is a drink that is sold at pharmacies and stores. °Drink enough fluid to keep your pee pale yellow. °Drink clear fluids in small amounts as you are able. Clear fluids include: °Water. °Ice chips. °Fruit juice mixed with water. °Low-calorie sports drinks. °Eat bland foods that are easy to digest. Eat small amounts as you are able. These foods include: °Bananas. °Applesauce. °Rice. °Lean meats. °Toast. °Crackers. °Do not eat or drink: °Fluids that have a lot of sugar or caffeine. °Alcohol. °Spicy or fatty foods. °General instructions °Take over-the-counter and prescription medicines only as told by your doctor. °Use a cool mist humidifier to add moisture to the air in your home. This can make it easier for you to breathe. °When using a cool mist humidifier, clean it daily. Empty water and replace with clean water. °Cover your mouth and nose when you cough or sneeze. °Wash your hands with soap and water often and for at least 20 seconds. This is also important after   you cough or sneeze. If you cannot use soap and water, use alcohol-based hand sanitizer. °Keep all follow-up visits. °How is this prevented? ° °Get a flu shot every year. You may get the flu shot in late summer, fall, or winter. Ask your doctor when you should get your flu shot. °Avoid contact with people who are sick during fall and winter. This is cold and flu season. °Contact a doctor if: °You get  new symptoms. °You have: °Chest pain. °Watery poop (diarrhea). °A fever. °Your cough gets worse. °You start to have more mucus. °You feel sick to your stomach. °You throw up. °Get help right away if you: °Have shortness of breath. °Have trouble breathing. °Have skin or nails that turn a bluish color. °Have very bad pain or stiffness in your neck. °Get a sudden headache. °Get sudden pain in your face or ear. °Cannot eat or drink without throwing up. °These symptoms may represent a serious problem that is an emergency. Get medical help right away. Call your local emergency services (911 in the [place] °Do not wait to see if the symptoms will go away. °Do not drive yourself to the hospital. °Summary °Influenza is also called "the flu." It is an infection in the lungs, nose, and throat. It spreads easily from person to person. °Take over-the-counter and prescription medicines only as told by your doctor. °Getting a flu shot every year is the best way to not get the flu. °This information is not intended to replace advice given to you by your health care provider. Make sure you discuss any questions you have with your health care provider. °Document Revised: 12/24/2019 Document Reviewed: 12/24/2019 °Elsevier Patient Education © 2022 [employer] ° °

## 2021-03-14 NOTE — Assessment & Plan Note (Signed)
With current acute symptoms, is on day 3 Tamiflu.  Suspect influenza, but has been unable to get out and get tested due to transportation.  At this time continue Tamiflu until complete.  Recommend we obtain chest x-[facility], ordered today.  Instructed on where she is to obtain.  Start Azithromycin and 5 days of Prednisone 40 MG daily -- concern for bacterial infection or exacerbation of underlying lung diease with flu.  Tessalon sent for cough.  Recommend she continue inhalers and use nebulizer as needed.  Plan for return in 5 days -- she is aware if worsening symptoms to immediately go to ER or UC setting for assessment.

## 2021-03-14 NOTE — Progress Notes (Signed)
BP 140/80   LMP  (LMP Unknown)   SpO2 94%    Subjective:    Patient ID: [name], female    DOB: [birth year], 80 y.o.   MRN: [id number]  HPI: [name] is a 80 y.o. female  Chief Complaint  Patient presents with   Influenza    Patient states her grandson tested positive for the Flu this past weekend and he lives with patient. Patient states she is having some crackling in her throat, discolored mucus that she is coughing and states she did have a fever and states it is not anymore. Patient states she has generalized body aches. Patient states her symptoms Sunday.    This visit was completed via video visit through MyChart due to the restrictions of the COVID-19 pandemic. All issues as above were discussed and addressed. Physical exam was done as above through visual confirmation on video through MyChart. If it was felt that the patient should be evaluated in the office, they were directed there. The patient verbally consented to this visit. Location of the patient: home Location of the provider: work Those involved with this call:  Provider: [name], DNP CMA: Irena Reichmann, [place] Desk/Registration: [facility]  Time spent on call:  21 minutes with patient face to face via video conference. More than 50% of this time was spent in counseling and coordination of care. 15 minutes total spent in review of patient's record and preparation of their chart.  I verified patient identity using two factors (patient name and date of birth). Patient consents verbally to being seen via telemedicine visit today.     EXPOSURE TO FLU  Sent in Tamiflu for patient on 03/12/21.  Grandson tested positive for flu, they live in same house, tested positive on weekend.  She has been negative for Covid on home testing.  Symptoms not improving, spitting up yellowish phlegm and coarseness in upper chest. Patient has not tested for flu, has been unable to get out of house.  Is using inhalers at  home at this time. Fever:  had one episode Cough: yes Shortness of breath: no -- this has improved Tamiflu Wheezing: yes Chest pain: no Chest tightness: yes Chest congestion: yes Nasal congestion: no Runny nose: no Post nasal drip: no Sneezing: no Sore throat: yes Swollen glands: no Sinus pressure: yes Headache: yes Face pain: no Toothache: no Ear pain: yes bilateral Ear pressure: yes bilateral Eyes red/itching:no Eye drainage/crusting: no  Vomiting:  on Tuesday x 1 Rash: no Fatigue: yes Sick contacts: yes Strep contacts: no  Context: worse Recurrent sinusitis: no Relief with OTC cold/cough medications: yes  Treatments attempted: cold/sinus and mucinex    Relevant past medical, surgical, family and social history reviewed and updated as indicated. Interim medical history since our last visit reviewed. Allergies and medications reviewed and updated.  Review of Systems  Constitutional:  Positive for fatigue and fever. Negative for activity change and appetite change.  HENT:  Positive for ear pain, sinus pressure, sinus pain and sore throat. Negative for congestion, ear discharge, facial swelling, postnasal drip, rhinorrhea, sneezing and voice change.   Eyes:  Negative for pain and visual disturbance.  Respiratory:  Positive for cough, chest tightness and wheezing. Negative for shortness of breath.   Cardiovascular:  Negative for chest pain, palpitations and leg swelling.  Gastrointestinal:  Positive for nausea. Negative for abdominal distention, abdominal pain, constipation, diarrhea and vomiting.  Endocrine: Negative.   Musculoskeletal:  Positive for myalgias.  Neurological:  Negative for dizziness, numbness and headaches.  Psychiatric/Behavioral: Negative.     Per HPI unless specifically indicated above     Objective:    BP 140/80   LMP  (LMP Unknown)   SpO2 94%   Wt Readings from Last 3 Encounters:  11/29/20 (!) 340 lb (154.2 kg)  07/12/20 (!) 320 lb (145.2  kg)  04/17/20 (!) 333 lb (151 kg)    Physical Exam Vitals and nursing note reviewed.  Constitutional:      General: She is awake. She is not in acute distress.    Appearance: She is well-developed and well-groomed. She is obese. She is ill-appearing. She is not toxic-appearing.  HENT:     Head: Normocephalic.     Right Ear: Hearing normal.     Left Ear: Hearing normal.  Eyes:     General: Lids are normal.        Right eye: No discharge.        Left eye: No discharge.     Conjunctiva/sclera: Conjunctivae normal.  Pulmonary:     Effort: Pulmonary effort is normal. No accessory muscle usage or respiratory distress.  Musculoskeletal:     Cervical back: Normal range of motion.  Neurological:     Mental Status: She is alert and oriented to person, place, and time.  Psychiatric:        Attention and Perception: Attention normal.        Mood and Affect: Mood normal.        Behavior: Behavior normal. Behavior is cooperative.        Thought Content: Thought content normal.        Judgment: Judgment normal.    Results for orders placed or performed in visit on 10/17/20  CBC with Differential/Platelet  Result Value Ref Range   WBC 4.6 3.4 - 10.8 x10E3/uL   RBC 4.83 3.77 - 5.28 x10E6/uL   Hemoglobin 14.6 11.1 - 15.9 g/dL   Hematocrit 53.6 90.1 - 46.6 %   MCV 91 79 - 97 fL   MCH 30.2 26.6 - 33.0 pg   MCHC 33.4 31.5 - 35.7 g/dL   RDW 95.3 76.9 - 11.4 %   Platelets 258 150 - 450 x10E3/uL   Neutrophils 54 Not Estab. %   Lymphs 35 Not Estab. %   Monocytes 10 Not Estab. %   Eos 1 Not Estab. %   Basos 0 Not Estab. %   Neutrophils Absolute 2.5 1.4 - 7.0 x10E3/uL   Lymphocytes Absolute 1.6 0.7 - 3.1 x10E3/uL   Monocytes Absolute 0.4 0.1 - 0.9 x10E3/uL   EOS (ABSOLUTE) 0.0 0.0 - 0.4 x10E3/uL   Basophils Absolute 0.0 0.0 - 0.2 x10E3/uL   Immature Granulocytes 0 Not Estab. %   Immature Grans (Abs) 0.0 0.0 - 0.1 x10E3/uL  Comprehensive metabolic panel  Result Value Ref Range   Glucose  113 (H) 65 - 99 mg/dL   BUN 17 8 - 27 mg/dL   Creatinine, Ser 6.54 0.57 - 1.00 mg/dL   eGFR 67 >55 BI/YXY/5.86   BUN/Creatinine Ratio 20 12 - 28   Sodium 140 134 - 144 mmol/L   Potassium 4.6 3.5 - 5.2 mmol/L   Chloride 107 (H) 96 - 106 mmol/L   CO2 18 (L) 20 - 29 mmol/L   Calcium 10.3 8.7 - 10.3 mg/dL   Total Protein 6.7 6.0 - 8.5 g/dL   Albumin 4.3 3.7 - 4.7 g/dL   Globulin, Total 2.4 1.5 - 4.5 g/dL  Albumin/Globulin Ratio 1.8 1.2 - 2.2   Bilirubin Total 0.2 0.0 - 1.2 mg/dL   Alkaline Phosphatase 92 44 - 121 IU/L   AST 17 0 - 40 IU/L   ALT 14 0 - 32 IU/L  Lipid Panel w/o Chol/HDL Ratio  Result Value Ref Range   Cholesterol, Total 204 (H) 100 - 199 mg/dL   Triglycerides 121 0 - 149 mg/dL   HDL 93 >39 mg/dL   VLDL Cholesterol Cal 21 5 - 40 mg/dL   LDL Chol Calc (NIH) 90 0 - 99 mg/dL  TSH  Result Value Ref Range   TSH 3.440 0.450 - 4.500 uIU/mL  VITAMIN D 25 Hydroxy (Vit-D Deficiency, Fractures)  Result Value Ref Range   Vit D, 25-Hydroxy 56.4 30.0 - 100.0 ng/mL  Vitamin B12  Result Value Ref Range   Vitamin B-12 345 232 - 1,245 pg/mL  Magnesium  Result Value Ref Range   Magnesium 1.7 1.6 - 2.3 mg/dL      Assessment & Plan:   Problem List Items Addressed This Visit       Other   Exposure to the flu - Primary    With current acute symptoms, is on day 3 Tamiflu.  Suspect influenza, but has been unable to get out and get tested due to transportation.  At this time continue Tamiflu until complete.  Recommend we obtain chest x-[facility], ordered today.  Instructed on where she is to obtain.  Start Azithromycin and 5 days of Prednisone 40 MG daily -- concern for bacterial infection or exacerbation of underlying lung diease with flu.  Tessalon sent for cough.  Recommend she continue inhalers and use nebulizer as needed.  Plan for return in 5 days -- she is aware if worsening symptoms to immediately go to ER or UC setting for assessment.      Relevant Orders   DG Chest 2 View     I discussed the assessment and treatment plan with the patient. The patient was provided an opportunity to ask questions and all were answered. The patient agreed with the plan and demonstrated an understanding of the instructions.   The patient was advised to call back or seek an in-person evaluation if the symptoms worsen or if the condition fails to improve as anticipated.   I provided 21+ minutes of time during this encounter.   Follow up plan: Return in about 5 days (around 03/19/2021) for Flu follow-up virtual.

## 2021-03-14 NOTE — Telephone Encounter (Signed)
Copied from CRM [id number]. Topic: General - Other >> Mar 14, 2021  9:43 AM Jaquita Rector A wrote: Reason for CRM: Patient called in to inform Aura Dials that she have a really bad cough and hear some crackling in chest tried all OTC medication but nothing have been working. Would like a call today to discuss  Ph# [phone]

## 2021-03-15 ENCOUNTER — Encounter: Admission: RE | Payer: Self-pay | Source: Home / Self Care

## 2021-03-15 ENCOUNTER — Ambulatory Visit: Admission: RE | Admit: 2021-03-15 | Payer: Medicare PPO | Source: Home / Self Care | Admitting: Ophthalmology

## 2021-03-15 SURGERY — PHACOEMULSIFICATION, CATARACT, WITH IOL INSERTION
Anesthesia: Choice | Laterality: Right

## 2021-03-15 NOTE — Progress Notes (Signed)
Pt is scheduled °

## 2021-03-19 ENCOUNTER — Ambulatory Visit
Admission: RE | Admit: 2021-03-19 | Discharge: 2021-03-19 | Disposition: A | Discharge status: Home / Self Care | Payer: Medicare PPO | Source: Home / Self Care | Attending: Nurse Practitioner | Admitting: Nurse Practitioner

## 2021-03-19 ENCOUNTER — Other Ambulatory Visit: Payer: Self-pay

## 2021-03-19 ENCOUNTER — Ambulatory Visit
Admission: RE | Admit: 2021-03-19 | Discharge: 2021-03-19 | Disposition: A | Discharge status: Home / Self Care | Payer: Medicare PPO | Source: Ambulatory Visit | Attending: Nurse Practitioner | Admitting: Nurse Practitioner

## 2021-03-19 DIAGNOSIS — I517 Cardiomegaly: Secondary | ICD-10-CM | POA: Diagnosis not present

## 2021-03-19 DIAGNOSIS — Z20828 Contact with and (suspected) exposure to other viral communicable diseases: Secondary | ICD-10-CM | POA: Insufficient documentation

## 2021-03-19 DIAGNOSIS — R059 Cough, unspecified: Secondary | ICD-10-CM | POA: Diagnosis not present

## 2021-03-19 DIAGNOSIS — J90 Pleural effusion, not elsewhere classified: Secondary | ICD-10-CM | POA: Diagnosis not present

## 2021-03-20 ENCOUNTER — Telehealth (INDEPENDENT_AMBULATORY_CARE_PROVIDER_SITE_OTHER): Payer: Medicare PPO | Admitting: Nurse Practitioner

## 2021-03-20 ENCOUNTER — Encounter: Payer: Self-pay | Admitting: Nurse Practitioner

## 2021-03-20 DIAGNOSIS — Z20828 Contact with and (suspected) exposure to other viral communicable diseases: Secondary | ICD-10-CM | POA: Diagnosis not present

## 2021-03-20 MED ORDER — DOXYCYCLINE HYCLATE 100 MG PO TABS: 100.0000 mg | ORAL_TABLET | Freq: Two times a day (BID) | ORAL | 0 refills | Status: AC

## 2021-03-20 MED ORDER — HYDROCOD POLST-CPM POLST ER 10-8 MG/5ML PO SUER: 5.0000 mL | Freq: Two times a day (BID) | ORAL | 0 refills | Status: DC | PRN

## 2021-03-20 MED ORDER — ALBUTEROL SULFATE HFA 108 (90 BASE) MCG/ACT IN AERS: 2.0000 | INHALATION_SPRAY | Freq: Four times a day (QID) | RESPIRATORY_TRACT | 2 refills | Status: DC | PRN

## 2021-03-20 MED ORDER — ALBUTEROL SULFATE (2.5 MG/3ML) 0.083% IN NEBU: 2.5000 mg | INHALATION_SOLUTION | Freq: Four times a day (QID) | RESPIRATORY_TRACT | 1 refills | Status: DC | PRN

## 2021-03-20 NOTE — Progress Notes (Signed)
BP (!) 141/79   Temp (!) 97.2 F (36.2 C)   LMP  (LMP Unknown)   SpO2 97%    Subjective:    Patient ID: [name], female    DOB: 06-Jul-1940, 80 y.o.   MRN: [id number]  HPI: [name] is a 80 y.o. female  Chief Complaint  Patient presents with   Cough    Follow up to the flu. Productive cough bringing up yellow fleam.    This visit was completed via telephone due to the restrictions of the COVID-19 pandemic. All issues as above were discussed and addressed but no physical exam was performed. If it was felt that the patient should be evaluated in the office, they were directed there. The patient verbally consented to this visit. Patient was unable to complete an audio/visual visit due to unable to connect. Due to the catastrophic nature of the COVID-19 pandemic, this visit was done through audio contact only. Location of the patient: home Location of the provider: work Those involved with this call:  Provider: [name], DNP CMA: Yvonna Alanis, CMA Front Desk/Registration: [employer]  Time spent on call:  21 minutes on the phone discussing health concerns. 15 minutes total spent in review of patient's record and preparation of their chart.  I verified patient identity using two factors (patient name and date of birth). Patient consents verbally to being seen via telemedicine visit today.    FLU FOLLOW-UP Follow-up for flu.  Sent in Tamiflu for patient on 03/12/21.  Grandson tested positive for flu, they live in same house, and she started symptoms after.  Negative for Covid on home testing.  On 03/14/21 sent in Azithromycin and 5 days of Prednisone due to ongoing productive cough being present and underlying lung disease.  She was to obtain chest x-[facility], but this is not resulted yet.  Continues to have cough with spasms which making catching breath hard.   Is using inhalers at home at this time. Fever: none Cough: yes Shortness of breath: no -- this has improved  Tamiflu Wheezing: yes Chest pain: no Chest tightness: yes Chest congestion: yes Nasal congestion: no Runny nose: yes Post nasal drip: yes Sneezing: no Sore throat: yes Swollen glands: no Sinus pressure: yes Headache: yes Face pain: no Toothache: no Ear pain: yes bilateral Ear pressure: yes bilateral Eyes red/itching:no Eye drainage/crusting: no  Vomiting: none Rash: no Fatigue: yes Sick contacts: yes Strep contacts: no  Context: worse Recurrent sinusitis: no Relief with OTC cold/cough medications: yes  Treatments attempted: cold/sinus and mucinex    Relevant past medical, surgical, family and social history reviewed and updated as indicated. Interim medical history since our last visit reviewed. Allergies and medications reviewed and updated.  Review of Systems  Constitutional:  Positive for fatigue. Negative for activity change, appetite change and fever.  HENT:  Positive for ear pain, sinus pressure and sinus pain. Negative for congestion, ear discharge, facial swelling, postnasal drip, rhinorrhea, sneezing, sore throat and voice change.   Eyes:  Negative for pain and visual disturbance.  Respiratory:  Positive for cough, chest tightness and wheezing. Negative for shortness of breath.   Cardiovascular:  Negative for chest pain, palpitations and leg swelling.  Gastrointestinal: Negative.   Endocrine: Negative.   Musculoskeletal:  Negative for myalgias.  Neurological:  Negative for dizziness, numbness and headaches.  Psychiatric/Behavioral: Negative.     Per HPI unless specifically indicated above     Objective:    BP (!) 141/79  Temp (!) 97.2 F (36.2 C)   LMP  (LMP Unknown)   SpO2 97%   Wt Readings from Last 3 Encounters:  11/29/20 (!) 340 lb (154.2 kg)  07/12/20 (!) 320 lb (145.2 kg)  04/17/20 (!) 333 lb (151 kg)    Physical Exam  Unable to perform due to telephone visit only  Results for orders placed or performed in visit on 10/17/20  CBC with  Differential/Platelet  Result Value Ref Range   WBC 4.6 3.4 - 10.8 x10E3/uL   RBC 4.83 3.77 - 5.28 x10E6/uL   Hemoglobin 14.6 11.1 - 15.9 g/dL   Hematocrit 43.7 34.0 - 46.6 %   MCV 91 79 - 97 fL   MCH 30.2 26.6 - 33.0 pg   MCHC 33.4 31.5 - 35.7 g/dL   RDW 12.7 11.7 - 15.4 %   Platelets 258 150 - 450 x10E3/uL   Neutrophils 54 Not Estab. %   Lymphs 35 Not Estab. %   Monocytes 10 Not Estab. %   Eos 1 Not Estab. %   Basos 0 Not Estab. %   Neutrophils Absolute 2.5 1.4 - 7.0 x10E3/uL   Lymphocytes Absolute 1.6 0.7 - 3.1 x10E3/uL   Monocytes Absolute 0.4 0.1 - 0.9 x10E3/uL   EOS (ABSOLUTE) 0.0 0.0 - 0.4 x10E3/uL   Basophils Absolute 0.0 0.0 - 0.2 x10E3/uL   Immature Granulocytes 0 Not Estab. %   Immature Grans (Abs) 0.0 0.0 - 0.1 x10E3/uL  Comprehensive metabolic panel  Result Value Ref Range   Glucose 113 (H) 65 - 99 mg/dL   BUN 17 8 - 27 mg/dL   Creatinine, Ser 0.87 0.57 - 1.00 mg/dL   eGFR 67 >59 mL/min/1.73   BUN/Creatinine Ratio 20 12 - 28   Sodium 140 134 - 144 mmol/L   Potassium 4.6 3.5 - 5.2 mmol/L   Chloride 107 (H) 96 - 106 mmol/L   CO2 18 (L) 20 - 29 mmol/L   Calcium 10.3 8.7 - 10.3 mg/dL   Total Protein 6.7 6.0 - 8.5 g/dL   Albumin 4.3 3.7 - 4.7 g/dL   Globulin, Total 2.4 1.5 - 4.5 g/dL   Albumin/Globulin Ratio 1.8 1.2 - 2.2   Bilirubin Total 0.2 0.0 - 1.2 mg/dL   Alkaline Phosphatase 92 44 - 121 IU/L   AST 17 0 - 40 IU/L   ALT 14 0 - 32 IU/L  Lipid Panel w/o Chol/HDL Ratio  Result Value Ref Range   Cholesterol, Total 204 (H) 100 - 199 mg/dL   Triglycerides 121 0 - 149 mg/dL   HDL 93 >39 mg/dL   VLDL Cholesterol Cal 21 5 - 40 mg/dL   LDL Chol Calc (NIH) 90 0 - 99 mg/dL  TSH  Result Value Ref Range   TSH 3.440 0.450 - 4.500 uIU/mL  VITAMIN D 25 Hydroxy (Vit-D Deficiency, Fractures)  Result Value Ref Range   Vit D, 25-Hydroxy 56.4 30.0 - 100.0 ng/mL  Vitamin B12  Result Value Ref Range   Vitamin B-12 345 232 - 1,245 pg/mL  Magnesium  Result Value Ref  Range   Magnesium 1.7 1.6 - 2.3 mg/dL      Assessment & Plan:   Problem List Items Addressed This Visit       Other   Exposure to the flu    Ongoing cough after Azithromycin and Prednisone -- CXR reviewed images, but no result yet. Some congestion remains on review.  Suspect influenza, but has been unable to get out and get tested  due to transportation.  Will add on Doxycyline due to lung disease and ongoing cough -- concern for bacterial infection or exacerbation of underlying lung diease with flu.  Tussionex sent for cough.  Recommend she continue inhalers and use nebulizer as needed -- refills sent in.  Plan for return in one week.       I discussed the assessment and treatment plan with the patient. The patient was provided an opportunity to ask questions and all were answered. The patient agreed with the plan and demonstrated an understanding of the instructions.   The patient was advised to call back or seek an in-person evaluation if the symptoms worsen or if the condition fails to improve as anticipated.   I provided 21+ minutes of time during this encounter.   Follow up plan: Return in about 1 week (around 03/27/2021) for Flu.

## 2021-03-20 NOTE — Patient Instructions (Signed)
Influenza, Adult °Influenza is also called "the flu." It is an infection in the lungs, nose, and throat (respiratory tract). It spreads easily from person to person (is contagious). The flu causes symptoms that are like a cold, along with high fever and body aches. °What are the causes? °This condition is caused by the influenza virus. You can get the virus by: °Breathing in droplets that are in the air after a person infected with the flu coughed or sneezed. °Touching something that has the virus on it and then touching your mouth, nose, or eyes. °What increases the risk? °Certain things may make you more likely to get the flu. These include: °Not washing your hands often. °Having close contact with many people during cold and flu season. °Touching your mouth, eyes, or nose without first washing your hands. °Not getting a flu shot every year. °You may have a higher risk for the flu, and serious problems, such as a lung infection (pneumonia), if you: °Are older than 65. °Are pregnant. °Have a weakened disease-fighting system (immune system) because of a disease or because you are taking certain medicines. °Have a long-term (chronic) condition, such as: °Heart, kidney, or lung disease. °Diabetes. °Asthma. °Have a liver disorder. °Are very overweight (morbidly obese). °Have anemia. °What are the signs or symptoms? °Symptoms usually begin suddenly and last 4-14 days. They may include: °Fever and chills. °Headaches, body aches, or muscle aches. °Sore throat. °Cough. °Runny or stuffy (congested) nose. °Feeling discomfort in your chest. °Not wanting to eat as much as normal. °Feeling weak or tired. °Feeling dizzy. °Feeling sick to your stomach or throwing up. °How is this treated? °If the flu is found early, you can be treated with antiviral medicine. This can help to reduce how bad the illness is and how long it lasts. This may be given by mouth or through an IV tube. °Taking care of yourself at home can help your  symptoms get better. Your doctor may want you to: °Take over-the-counter medicines. °Drink plenty of fluids. °The flu often goes away on its own. If you have very bad symptoms or other problems, you may be treated in a hospital. °Follow these instructions at home: °  °Activity °Rest as needed. Get plenty of sleep. °Stay home from work or school as told by your doctor. °Do not leave home until you do not have a fever for 24 hours without taking medicine. °Leave home only to go to your doctor. °Eating and drinking °Take an ORS (oral rehydration solution). This is a drink that is sold at pharmacies and stores. °Drink enough fluid to keep your pee pale yellow. °Drink clear fluids in small amounts as you are able. Clear fluids include: °Water. °Ice chips. °Fruit juice mixed with water. °Low-calorie sports drinks. °Eat bland foods that are easy to digest. Eat small amounts as you are able. These foods include: °Bananas. °Applesauce. °Rice. °Lean meats. °Toast. °Crackers. °Do not eat or drink: °Fluids that have a lot of sugar or caffeine. °Alcohol. °Spicy or fatty foods. °General instructions °Take over-the-counter and prescription medicines only as told by your doctor. °Use a cool mist humidifier to add moisture to the air in your home. This can make it easier for you to breathe. °When using a cool mist humidifier, clean it daily. Empty water and replace with clean water. °Cover your mouth and nose when you cough or sneeze. °Wash your hands with soap and water often and for at least 20 seconds. This is also important after   you cough or sneeze. If you cannot use soap and water, use alcohol-based hand sanitizer. °Keep all follow-up visits. °How is this prevented? ° °Get a flu shot every year. You may get the flu shot in late summer, fall, or winter. Ask your doctor when you should get your flu shot. °Avoid contact with people who are sick during fall and winter. This is cold and flu season. °Contact a doctor if: °You get  new symptoms. °You have: °Chest pain. °Watery poop (diarrhea). °A fever. °Your cough gets worse. °You start to have more mucus. °You feel sick to your stomach. °You throw up. °Get help right away if you: °Have shortness of breath. °Have trouble breathing. °Have skin or nails that turn a bluish color. °Have very bad pain or stiffness in your neck. °Get a sudden headache. °Get sudden pain in your face or ear. °Cannot eat or drink without throwing up. °These symptoms may represent a serious problem that is an emergency. Get medical help right away. Call your local emergency services (911 in the [place] °Do not wait to see if the symptoms will go away. °Do not drive yourself to the hospital. °Summary °Influenza is also called "the flu." It is an infection in the lungs, nose, and throat. It spreads easily from person to person. °Take over-the-counter and prescription medicines only as told by your doctor. °Getting a flu shot every year is the best way to not get the flu. °This information is not intended to replace advice given to you by your health care provider. Make sure you discuss any questions you have with your health care provider. °Document Revised: 12/24/2019 Document Reviewed: 12/24/2019 °Elsevier Patient Education © 2022 [employer] ° °

## 2021-03-20 NOTE — Assessment & Plan Note (Signed)
Ongoing cough after Azithromycin and Prednisone -- CXR reviewed images, but no result yet. Some congestion remains on review.  Suspect influenza, but has been unable to get out and get tested due to transportation.  Will add on Doxycyline due to lung disease and ongoing cough -- concern for bacterial infection or exacerbation of underlying lung diease with flu.  Tussionex sent for cough.  Recommend she continue inhalers and use nebulizer as needed -- refills sent in.  Plan for return in one week.

## 2021-03-20 NOTE — Addendum Note (Signed)
Addended by: Aura Dials T on: 03/20/2021 12:14 PM   Modules accepted: Orders

## 2021-03-21 NOTE — Progress Notes (Signed)
Contacted via MyChart   Good afternoon [name], your imaging has returned.  I have reviewed images and report.  There is no pneumonia.  There is a pleural effusion noted, which is a collection of fluid in lung + your heart continues to show stable enlargement of heart, seen on past imaging.  If cough is ongoing would benefit a visit with me in office to check and ensure no heart failure presenting -- would do labs and obtain echocardiogram of heart.  Let me know if any questions please. Keep being amazing!!  Thank you for allowing me to participate in your care.  I appreciate you. Kindest regards, Mateja Dier

## 2021-03-28 ENCOUNTER — Telehealth (INDEPENDENT_AMBULATORY_CARE_PROVIDER_SITE_OTHER): Payer: Medicare PPO | Admitting: Nurse Practitioner

## 2021-03-28 ENCOUNTER — Encounter: Payer: Self-pay | Admitting: Nurse Practitioner

## 2021-03-28 DIAGNOSIS — Z20828 Contact with and (suspected) exposure to other viral communicable diseases: Secondary | ICD-10-CM

## 2021-03-28 DIAGNOSIS — J90 Pleural effusion, not elsewhere classified: Secondary | ICD-10-CM | POA: Insufficient documentation

## 2021-03-28 NOTE — Progress Notes (Signed)
LMP  (LMP Unknown)   SpO2 96%    Subjective:    Patient ID: [name], female    DOB: [birth year], 80 y.o.   MRN: [id number]  HPI: [name] is a 80 y.o. female  Chief Complaint  Patient presents with   Cough   Influenza    Patient is here to follow up on Flu. Patient states she still has lingering cough. Patient states she is currently taking Tussionex and it helped a lot.     This visit was completed via video visit through MyChart due to the restrictions of the COVID-19 pandemic. All issues as above were discussed and addressed. Physical exam was done as above through visual confirmation on video through MyChart. If it was felt that the patient should be evaluated in the office, they were directed there. The patient verbally consented to this visit. Location of the patient: home Location of the provider: work Those involved with this call:  Provider: [name], DNP CMA: Irena Reichmann, [place] Desk/Registration: [employer]  Time spent on call:  21 minutes with patient face to face via video conference. More than 50% of this time was spent in counseling and coordination of care. 15 minutes total spent in review of patient's record and preparation of their chart. I verified patient identity using two factors (patient name and date of birth). Patient consents verbally to being seen via telemedicine visit today.     INFLUENZA Treated on 03/12/21 for influenza after exposure from a family member -- treated with Tamiflu, had ongoing cough post treatment and was given Azithromycin and Doxycycline + Prednisone for concerns of pneumonia.  Imaging was done on 03/19/21 and no consolidation noted.  Was noted to have stable cardiomegaly, ongoing and noted on imaging since around 2017, plus a pleural effusion.  At this time she she reports Tussionex has helped a lot and she is completing Doxycycline.   Fever: no Cough: yes Shortness of breath: no Wheezing:  a little bit Chest  pain: no Chest tightness: no Chest congestion: no Nasal congestion: no Runny nose: no Post nasal drip: no Sneezing: no Sore throat: no Swollen glands: no Sinus pressure: no Headache: no Face pain: no Toothache: no Ear pain: none Ear pressure: none Eyes red/itching:no Eye drainage/crusting: no  Vomiting: no Rash: no Fatigue: yes Sick contacts: yes Strep contacts: no  Context: stable Recurrent sinusitis: no Relief with OTC cold/cough medications: yes  Treatments attempted: cold/sinus, cough syrup, and antibiotics    Relevant past medical, surgical, family and social history reviewed and updated as indicated. Interim medical history since our last visit reviewed. Allergies and medications reviewed and updated.  Review of Systems  Constitutional:  Negative for activity change, appetite change, fatigue and fever.  HENT: Negative.    Respiratory:  Positive for cough and chest tightness. Negative for shortness of breath and wheezing.   Cardiovascular:  Negative for chest pain, palpitations and leg swelling.  Gastrointestinal: Negative.   Endocrine: Negative.   Musculoskeletal:  Negative for myalgias.  Neurological:  Negative for dizziness, numbness and headaches.  Psychiatric/Behavioral: Negative.     Per HPI unless specifically indicated above     Objective:    LMP  (LMP Unknown)   SpO2 96%   Wt Readings from Last 3 Encounters:  11/29/20 (!) 340 lb (154.2 kg)  07/12/20 (!) 320 lb (145.2 kg)  04/17/20 (!) 333 lb (151 kg)    Physical Exam Vitals and nursing note reviewed.  Constitutional:  General: She is awake. She is not in acute distress.    Appearance: She is well-developed. She is not ill-appearing.  HENT:     Head: Normocephalic.     Right Ear: Hearing normal.     Left Ear: Hearing normal.  Eyes:     General: Lids are normal.        Right eye: No discharge.        Left eye: No discharge.     Conjunctiva/sclera: Conjunctivae normal.  Pulmonary:      Effort: Pulmonary effort is normal. No accessory muscle usage or respiratory distress.  Musculoskeletal:     Cervical back: Normal range of motion.  Neurological:     Mental Status: She is alert and oriented to person, place, and time.  Psychiatric:        Attention and Perception: Attention normal.        Mood and Affect: Mood normal.        Behavior: Behavior normal. Behavior is cooperative.        Thought Content: Thought content normal.        Judgment: Judgment normal.   Results for orders placed or performed in visit on 10/17/20  CBC with Differential/Platelet  Result Value Ref Range   WBC 4.6 3.4 - 10.8 x10E3/uL   RBC 4.83 3.77 - 5.28 x10E6/uL   Hemoglobin 14.6 11.1 - 15.9 g/dL   Hematocrit 43.7 34.0 - 46.6 %   MCV 91 79 - 97 fL   MCH 30.2 26.6 - 33.0 pg   MCHC 33.4 31.5 - 35.7 g/dL   RDW 12.7 11.7 - 15.4 %   Platelets 258 150 - 450 x10E3/uL   Neutrophils 54 Not Estab. %   Lymphs 35 Not Estab. %   Monocytes 10 Not Estab. %   Eos 1 Not Estab. %   Basos 0 Not Estab. %   Neutrophils Absolute 2.5 1.4 - 7.0 x10E3/uL   Lymphocytes Absolute 1.6 0.7 - 3.1 x10E3/uL   Monocytes Absolute 0.4 0.1 - 0.9 x10E3/uL   EOS (ABSOLUTE) 0.0 0.0 - 0.4 x10E3/uL   Basophils Absolute 0.0 0.0 - 0.2 x10E3/uL   Immature Granulocytes 0 Not Estab. %   Immature Grans (Abs) 0.0 0.0 - 0.1 x10E3/uL  Comprehensive metabolic panel  Result Value Ref Range   Glucose 113 (H) 65 - 99 mg/dL   BUN 17 8 - 27 mg/dL   Creatinine, Ser 0.87 0.57 - 1.00 mg/dL   eGFR 67 >59 mL/min/1.73   BUN/Creatinine Ratio 20 12 - 28   Sodium 140 134 - 144 mmol/L   Potassium 4.6 3.5 - 5.2 mmol/L   Chloride 107 (H) 96 - 106 mmol/L   CO2 18 (L) 20 - 29 mmol/L   Calcium 10.3 8.7 - 10.3 mg/dL   Total Protein 6.7 6.0 - 8.5 g/dL   Albumin 4.3 3.7 - 4.7 g/dL   Globulin, Total 2.4 1.5 - 4.5 g/dL   Albumin/Globulin Ratio 1.8 1.2 - 2.2   Bilirubin Total 0.2 0.0 - 1.2 mg/dL   Alkaline Phosphatase 92 44 - 121 IU/L   AST 17 0 - 40  IU/L   ALT 14 0 - 32 IU/L  Lipid Panel w/o Chol/HDL Ratio  Result Value Ref Range   Cholesterol, Total 204 (H) 100 - 199 mg/dL   Triglycerides 121 0 - 149 mg/dL   HDL 93 >39 mg/dL   VLDL Cholesterol Cal 21 5 - 40 mg/dL   LDL Chol Calc (NIH) 90 0 - 99  mg/dL  TSH  Result Value Ref Range   TSH 3.440 0.450 - 4.500 uIU/mL  VITAMIN D 25 Hydroxy (Vit-D Deficiency, Fractures)  Result Value Ref Range   Vit D, 25-Hydroxy 56.4 30.0 - 100.0 ng/mL  Vitamin B12  Result Value Ref Range   Vitamin B-12 345 232 - 1,245 pg/mL  Magnesium  Result Value Ref Range   Magnesium 1.7 1.6 - 2.3 mg/dL      Assessment & Plan:   Problem List Items Addressed This Visit       Respiratory   Pleural effusion    Noted on recent imaging with flu.  Will have her return in 6 weeks post flu and recheck imaging.  If ongoing abnormality noted on imaging will obtain echo to further assess heart, has had stable cardiomegaly on imaging for several years now.        Other   Exposure to the flu - Primary    Improving, but has ongoing cough.  Will complete Doxycycline script and continue Tussionex as needed.  Plan on having her return in 6 weeks for follow-up and will repeat CXR at time to check pleural effusion.         I discussed the assessment and treatment plan with the patient. The patient was provided an opportunity to ask questions and all were answered. The patient agreed with the plan and demonstrated an understanding of the instructions.   The patient was advised to call back or seek an in-person evaluation if the symptoms worsen or if the condition fails to improve as anticipated.   I provided 21+ minutes of time during this encounter.   Follow up plan: Return in about 5 weeks (around 05/02/2021) for Cough and Pleual Effusion f/u.

## 2021-03-28 NOTE — Assessment & Plan Note (Signed)
Noted on recent imaging with flu.  Will have her return in 6 weeks post flu and recheck imaging.  If ongoing abnormality noted on imaging will obtain echo to further assess heart, has had stable cardiomegaly on imaging for several years now.

## 2021-03-28 NOTE — Patient Instructions (Signed)

## 2021-03-28 NOTE — Assessment & Plan Note (Signed)
Improving, but has ongoing cough.  Will complete Doxycycline script and continue Tussionex as needed.  Plan on having her return in 6 weeks for follow-up and will repeat CXR at time to check pleural effusion.

## 2021-03-29 NOTE — Progress Notes (Signed)
This pt has an appt 12/27, do you want her to come twice?

## 2021-04-02 ENCOUNTER — Telehealth: Payer: Self-pay | Admitting: Nurse Practitioner

## 2021-04-02 NOTE — Telephone Encounter (Signed)
This patient needs to be seen in the office. Please schedule her an in office appt.

## 2021-04-02 NOTE — Telephone Encounter (Signed)
Noted, will follow-up with her on 16th.

## 2021-04-02 NOTE — Telephone Encounter (Signed)
Copied from CRM [id number]. Topic: General - Other >> Apr 02, 2021  2:53 PM Gaetana Michaelis A wrote: Reason for CRM: The patient would like their PCP to be aware that they've been experiencing discomfort in the frontal area of their right leg   The patient has experienced the discomfort since Thursday night 03/29/21  The patient shares that the discomfort and the location of it, are both familiar to the patient   The patient has scheduled a virtual appt for 04/04/21 but would like for their PCP. Prov. Cannady to be made aware

## 2021-04-03 NOTE — Telephone Encounter (Signed)
Appointment scheduled 04/04/2021.

## 2021-04-04 ENCOUNTER — Encounter: Payer: Self-pay | Admitting: Nurse Practitioner

## 2021-04-04 ENCOUNTER — Telehealth (INDEPENDENT_AMBULATORY_CARE_PROVIDER_SITE_OTHER): Payer: Medicare PPO | Admitting: Nurse Practitioner

## 2021-04-04 DIAGNOSIS — K5792 Diverticulitis of intestine, part unspecified, without perforation or abscess without bleeding: Secondary | ICD-10-CM | POA: Insufficient documentation

## 2021-04-04 NOTE — Progress Notes (Signed)
BP 139/75 (BP Location: Left Arm)   Pulse 76   Temp (!) 97.1 F (36.2 C)   LMP  (LMP Unknown)   SpO2 97%    Subjective:    Patient ID: [name], female    DOB: 01-06-1941, 80 y.o.   MRN: [id number]  HPI: [name] is a 80 y.o. female  Chief Complaint  Patient presents with   Diverticulitis    Patient states she may be having a flare up again. Pain is located where he leg and torso connect. Patient states she has been extremely constipated and states she has been taking laxative which was another reason she was not able to come in. Patient states when she gets in a reclining position it helps ease the pain, and when she sits in a straight position and it applies pressure to the area she feel the pain. Patient states the antibiotics she is taking currently is helping her flare up as well as he   This visit was completed via video visit through MyChart due to the restrictions of the COVID-19 pandemic. All issues as above were discussed and addressed. Physical exam was done as above through visual confirmation on video through MyChart. If it was felt that the patient should be evaluated in the office, they were directed there. The patient verbally consented to this visit. Location of the patient: home Location of the provider: work Those involved with this call:  Provider: [name], DNP CMA: Irena Reichmann, [place] Desk/Registration: [employer]  Time spent on call:  20 minutes with patient face to face via video conference. More than 50% of this time was spent in counseling and coordination of care. 15 minutes total spent in review of patient's record and preparation of their chart.  I verified patient identity using two factors (patient name and date of birth). Patient consents verbally to being seen via telemedicine visit today.    ABDOMINAL PAIN  She thinks she may be having flare of diverticulitis, having discomfort to middle of leg and wear it bends and up to hip/groin  area to right side -- this is how diverticulitis presented in past.  Was on Doxycycline recently due to ongoing cough after flu.  Has been really constipated with Doxycycline.  Over the weekend was worse, improving today.  Feeling better since bowels started moving -- pain is improving.  Did eat a large tomato three days straight in a row prior + pecans prior to discomfort. Duration:days Onset: gradual Location:  RLQ  Episode duration: last Thursday night Radiation: no Frequency: intermittent Alleviating factors: reclining Aggravating factors: unknown Status: better Treatments attempted: Miralax and Colace Fever: no Nausea: yes Vomiting: no Weight loss: no Decreased appetite: a little decreased since the flu Diarrhea: no Constipation: yes Blood in stool: no Heartburn: no Jaundice: no Rash: no Dysuria/urinary frequency: no Hematuria: no History of sexually transmitted disease: no Recurrent NSAID use: no   Relevant past medical, surgical, family and social history reviewed and updated as indicated. Interim medical history since our last visit reviewed. Allergies and medications reviewed and updated.  Review of Systems  Constitutional:  Positive for appetite change (with flu recently). Negative for activity change, fatigue and fever.  Respiratory: Negative.    Cardiovascular: Negative.   Gastrointestinal:  Positive for abdominal pain, constipation and nausea. Negative for blood in stool, diarrhea, rectal pain and vomiting.  Neurological: Negative.   Psychiatric/Behavioral: Negative.     Per HPI unless specifically indicated above  Objective:    BP 139/75 (BP Location: Left Arm)   Pulse 76   Temp (!) 97.1 F (36.2 C)   LMP  (LMP Unknown)   SpO2 97%   Wt Readings from Last 3 Encounters:  11/29/20 (!) 340 lb (154.2 kg)  07/12/20 (!) 320 lb (145.2 kg)  04/17/20 (!) 333 lb (151 kg)    Physical Exam Vitals and nursing note reviewed.  Constitutional:      General:  She is awake. She is not in acute distress.    Appearance: She is well-developed and well-groomed. She is obese. She is not ill-appearing or toxic-appearing.  HENT:     Head: Normocephalic.     Right Ear: Hearing normal.     Left Ear: Hearing normal.  Eyes:     General: Lids are normal.        Right eye: No discharge.        Left eye: No discharge.     Conjunctiva/sclera: Conjunctivae normal.  Pulmonary:     Effort: Pulmonary effort is normal. No accessory muscle usage or respiratory distress.  Musculoskeletal:     Cervical back: Normal range of motion.  Neurological:     Mental Status: She is alert and oriented to person, place, and time.  Psychiatric:        Attention and Perception: Attention normal.        Mood and Affect: Mood normal.        Behavior: Behavior normal. Behavior is cooperative.        Thought Content: Thought content normal.        Judgment: Judgment normal.    Results for orders placed or performed in visit on 10/17/20  CBC with Differential/Platelet  Result Value Ref Range   WBC 4.6 3.4 - 10.8 x10E3/uL   RBC 4.83 3.77 - 5.28 x10E6/uL   Hemoglobin 14.6 11.1 - 15.9 g/dL   Hematocrit 43.7 34.0 - 46.6 %   MCV 91 79 - 97 fL   MCH 30.2 26.6 - 33.0 pg   MCHC 33.4 31.5 - 35.7 g/dL   RDW 12.7 11.7 - 15.4 %   Platelets 258 150 - 450 x10E3/uL   Neutrophils 54 Not Estab. %   Lymphs 35 Not Estab. %   Monocytes 10 Not Estab. %   Eos 1 Not Estab. %   Basos 0 Not Estab. %   Neutrophils Absolute 2.5 1.4 - 7.0 x10E3/uL   Lymphocytes Absolute 1.6 0.7 - 3.1 x10E3/uL   Monocytes Absolute 0.4 0.1 - 0.9 x10E3/uL   EOS (ABSOLUTE) 0.0 0.0 - 0.4 x10E3/uL   Basophils Absolute 0.0 0.0 - 0.2 x10E3/uL   Immature Granulocytes 0 Not Estab. %   Immature Grans (Abs) 0.0 0.0 - 0.1 x10E3/uL  Comprehensive metabolic panel  Result Value Ref Range   Glucose 113 (H) 65 - 99 mg/dL   BUN 17 8 - 27 mg/dL   Creatinine, Ser 0.87 0.57 - 1.00 mg/dL   eGFR 67 >59 mL/min/1.73    BUN/Creatinine Ratio 20 12 - 28   Sodium 140 134 - 144 mmol/L   Potassium 4.6 3.5 - 5.2 mmol/L   Chloride 107 (H) 96 - 106 mmol/L   CO2 18 (L) 20 - 29 mmol/L   Calcium 10.3 8.7 - 10.3 mg/dL   Total Protein 6.7 6.0 - 8.5 g/dL   Albumin 4.3 3.7 - 4.7 g/dL   Globulin, Total 2.4 1.5 - 4.5 g/dL   Albumin/Globulin Ratio 1.8 1.2 - 2.2   Bilirubin  Total 0.2 0.0 - 1.2 mg/dL   Alkaline Phosphatase 92 44 - 121 IU/L   AST 17 0 - 40 IU/L   ALT 14 0 - 32 IU/L  Lipid Panel w/o Chol/HDL Ratio  Result Value Ref Range   Cholesterol, Total 204 (H) 100 - 199 mg/dL   Triglycerides 121 0 - 149 mg/dL   HDL 93 >39 mg/dL   VLDL Cholesterol Cal 21 5 - 40 mg/dL   LDL Chol Calc (NIH) 90 0 - 99 mg/dL  TSH  Result Value Ref Range   TSH 3.440 0.450 - 4.500 uIU/mL  VITAMIN D 25 Hydroxy (Vit-D Deficiency, Fractures)  Result Value Ref Range   Vit D, 25-Hydroxy 56.4 30.0 - 100.0 ng/mL  Vitamin B12  Result Value Ref Range   Vitamin B-12 345 232 - 1,245 pg/mL  Magnesium  Result Value Ref Range   Magnesium 1.7 1.6 - 2.3 mg/dL      Assessment & Plan:   Problem List Items Addressed This Visit       Other   Diverticulitis    Acute and improving, started after eating trigger foods recently -- pecans and tomatoes.  Discussed with her current guideline recommendations for outpatient care, with recommendations against abx use unless ongoing symptoms.  At this time she is doing exact outpatient care treatment -- increased hydration and working on bowel regimen + diet changes (avoiding triggers), recommend she continue this since symptoms are improving.  If any worsening then return to office and we will consider Augmentin script.         I discussed the assessment and treatment plan with the patient. The patient was provided an opportunity to ask questions and all were answered. The patient agreed with the plan and demonstrated an understanding of the instructions.   The patient was advised to call back or seek  an in-person evaluation if the symptoms worsen or if the condition fails to improve as anticipated.   I provided 21+ minutes of time during this encounter.   Follow up plan: Return if symptoms worsen or fail to improve.

## 2021-04-04 NOTE — Patient Instructions (Signed)
Diverticulitis °Diverticulitis is when small pouches in your colon (large intestine) get infected or swollen. This causes pain in the belly (abdomen) and watery poop (diarrhea). °These pouches are called diverticula. The pouches form in people who have a condition called diverticulosis. °What are the causes? °This condition may be caused by poop (stool) that gets trapped in the pouches in your colon. The poop lets germs (bacteria) grow in the pouches. This causes the infection. °What increases the risk? °You are more likely to get this condition if you have small pouches in your colon. The risk is higher if: °You are overweight or very overweight (obese). °You do not exercise enough. °You drink alcohol. °You smoke or use products with tobacco in them. °You eat a diet that has a lot of red meat such as beef, pork, or lamb. °You eat a diet that does not have enough fiber in it. °You are older than 80 years of age. °What are the signs or symptoms? °Pain in the belly. Pain is often on the left side, but it may be in other areas. °Fever and feeling cold. °Feeling like you may vomit. °Vomiting. °Having cramps. °Feeling full. °Changes to how often you poop. °Blood in your poop. °How is this treated? °Most cases are treated at home by: °Taking over-the-counter pain medicines. °Following a clear liquid diet. °Taking antibiotic medicines. °Resting. °Very bad cases may need to be treated at a hospital. This may include: °Not eating or drinking. °Taking prescription pain medicine. °Getting antibiotic medicines through an IV tube. °Getting fluid and food through an IV tube. °Having surgery. °When you are feeling better, your doctor may tell you to have a test to check your colon (colonoscopy). °Follow these instructions at home: °Medicines °Take over-the-counter and prescription medicines only as told by your doctor. These include: °Antibiotics. °Pain medicines. °Fiber pills. °Probiotics. °Stool softeners. °If you were  prescribed an antibiotic medicine, take it as told by your doctor. Do not stop taking the antibiotic even if you start to feel better. °Ask your doctor if the medicine prescribed to you requires you to avoid driving or using machinery. °Eating and drinking ° °Follow a diet as told by your doctor. °When you feel better, your doctor may tell you to change your diet. You may need to eat a lot of fiber. Fiber makes it easier to poop (have a bowel movement). Foods with fiber include: °Berries. °Beans. °Lentils. °Green vegetables. °Avoid eating red meat. °General instructions °Do not use any products that contain nicotine or tobacco, such as cigarettes, e-cigarettes, and chewing tobacco. If you need help quitting, ask your doctor. °Exercise 3 or more times a week. Try to get 30 minutes each time. Exercise enough to sweat and make your heart beat faster. °Keep all follow-up visits as told by your doctor. This is important. °Contact a doctor if: °Your pain does not get better. °You are not pooping like normal. °Get help right away if: °Your pain gets worse. °Your symptoms do not get better. °Your symptoms get worse very fast. °You have a fever. °You vomit more than one time. °You have poop that is: °Bloody. °Black. °Tarry. °Summary °This condition happens when small pouches in your colon get infected or swollen. °Take medicines only as told by your doctor. °Follow a diet as told by your doctor. °Keep all follow-up visits as told by your doctor. This is important. °This information is not intended to replace advice given to you by your health care provider. Make sure you   discuss any questions you have with your health care provider. °Document Revised: 02/15/2019 Document Reviewed: 02/15/2019 °Elsevier Patient Education © 2022 [employer] ° °

## 2021-04-04 NOTE — Assessment & Plan Note (Signed)
Acute and improving, started after eating trigger foods recently -- pecans and tomatoes.  Discussed with her current guideline recommendations for outpatient care, with recommendations against abx use unless ongoing symptoms.  At this time she is doing exact outpatient care treatment -- increased hydration and working on bowel regimen + diet changes (avoiding triggers), recommend she continue this since symptoms are improving.  If any worsening then return to office and we will consider Augmentin script.

## 2021-04-18 ENCOUNTER — Ambulatory Visit: Payer: BC Managed Care – PPO

## 2021-05-13 ENCOUNTER — Telehealth: Payer: Medicare PPO | Admitting: Emergency Medicine

## 2021-05-13 DIAGNOSIS — J441 Chronic obstructive pulmonary disease with (acute) exacerbation: Secondary | ICD-10-CM

## 2021-05-13 DIAGNOSIS — Z20822 Contact with and (suspected) exposure to covid-19: Secondary | ICD-10-CM | POA: Diagnosis not present

## 2021-05-13 DIAGNOSIS — U071 COVID-19: Secondary | ICD-10-CM | POA: Diagnosis not present

## 2021-05-13 DIAGNOSIS — R062 Wheezing: Secondary | ICD-10-CM

## 2021-05-13 MED ORDER — PREDNISONE 10 MG (21) PO TBPK: ORAL_TABLET | Freq: Every day | ORAL | 0 refills | Status: DC

## 2021-05-13 MED ORDER — BENZONATATE 100 MG PO CAPS: 100.0000 mg | ORAL_CAPSULE | Freq: Two times a day (BID) | ORAL | 0 refills | Status: DC | PRN

## 2021-05-13 NOTE — Progress Notes (Signed)
Virtual Visit Consent   [name], you are scheduled for a virtual visit with a [facility] provider today.     Just as with appointments in the office, your consent must be obtained to participate.  Your consent will be active for this visit and any virtual visit you may have with one of our providers in the next 365 days.     If you have a MyChart account, a copy of this consent can be sent to you electronically.  All virtual visits are billed to your insurance company just like a traditional visit in the office.    As this is a virtual visit, video technology does not allow for your provider to perform a traditional examination.  This may limit your provider's ability to fully assess your condition.  If your provider identifies any concerns that need to be evaluated in person or the need to arrange testing (such as labs, EKG, etc.), we will make arrangements to do so.     Although advances in technology are sophisticated, we cannot ensure that it will always work on either your end or our end.  If the connection with a video visit is poor, the visit may have to be switched to a telephone visit.  With either a video or telephone visit, we are not always able to ensure that we have a secure connection.     I need to obtain your verbal consent now.   Are you willing to proceed with your visit today? Yes   [name] has provided verbal consent on 05/13/2021 for a virtual visit (video or telephone).   [name], [place]   Date: 05/13/2021 10:08 AM   Virtual Visit via Video Note   I, [name], connected with  [name]  ([id number], 1940-07-20) on 05/13/21 at 10:00 AM EST by a video-enabled telemedicine application and verified that I am speaking with the correct person using two identifiers.  Location: Patient: Virtual Visit Location Patient: Home Provider: Virtual Visit Location Provider: Home Office   I discussed the limitations of evaluation and management by telemedicine  and the availability of in person appointments. The patient expressed understanding and agreed to proceed.    History of Present Illness: [name] is a 80 y.o. who identifies as a female who was assigned female at birth, and is being seen today for fatigue, body aches, low grade fever, wheezing, congestion, runny nose, and cough x 2 days ago.  Admits to covid exposure.  Took home test and was positive for covid.  Has tried OTC medications without relief.  Reports previous covid infection in the past.  Denies sore throat, SOB, CP, vomiting, diarrhea.    Hx significant for COPD  HPI: HPI  Problems:  Patient Active Problem List   Diagnosis Date Noted   Diverticulitis 04/04/2021   Pleural effusion 03/28/2021   Exposure to the flu 03/14/2021   History of 2019 novel coronavirus disease (COVID-19) 10/13/2020   BMI 50.0-59.9, adult (HCC) 03/31/2019   Chronic venous insufficiency 03/21/2019   OSA (obstructive sleep apnea) 03/12/2019   Insomnia 11/11/2018   Vitamin B12 deficiency 06/08/2018   Vitamin D deficiency 06/08/2018   GERD without esophagitis 03/09/2018   Thyroid nodule 08/19/2017   Hyperlipidemia 02/12/2017   Aortic atherosclerosis (HCC) 08/12/2016   Mobility impaired 11/10/2015   Depression 11/06/2015   Cellulitis 09/19/2015   Asthma 08/23/2015   Lymphedema 12/05/2014   Essential hypertension, benign 12/05/2014    Allergies:  Allergies  Allergen Reactions   Benicar [Olmesartan] Swelling   Latex Rash   Ace Inhibitors Cough   Codeine Nausea Only    Codeine cough syrup   Ciprofloxacin Itching and Rash   Diovan [Valsartan] Other (See Comments)    fatigue   Flagyl [Metronidazole] Itching and Rash   Lotensin [Benazepril Hcl] Cough   Penicillins Itching    Itching and rash    Sulfa Antibiotics Rash   Medications:  Current Outpatient Medications:    benzonatate (TESSALON) 100 MG capsule, Take 1 capsule (100 mg total) by mouth 2 (two) times daily as needed for cough.,  Disp: 20 capsule, Rfl: 0   predniSONE (STERAPRED UNI-PAK 21 TAB) 10 MG (21) TBPK tablet, Take by mouth daily. Take 6 tabs by mouth daily  for 2 days, then 5 tabs for 2 days, then 4 tabs for 2 days, then 3 tabs for 2 days, 2 tabs for 2 days, then 1 tab by mouth daily for 2 days, Disp: 42 tablet, Rfl: 0   albuterol (PROVENTIL) (2.5 MG/3ML) 0.083% nebulizer solution, Take 3 mLs (2.5 mg total) by nebulization every 6 (six) hours as needed for wheezing or shortness of breath., Disp: 150 mL, Rfl: 1   albuterol (VENTOLIN HFA) 108 (90 Base) MCG/ACT inhaler, Inhale 2 puffs into the lungs every 6 (six) hours as needed for wheezing or shortness of breath., Disp: 18 g, Rfl: 2   Apple Cider Vinegar 500 MG TABS, Take 500 mg by mouth with breakfast, with lunch, and with evening meal., Disp: , Rfl:    Ascorbic Acid (VITAMIN C) 1000 MG tablet, Take 1,000 mg by mouth daily., Disp: , Rfl:    aspirin EC 81 MG tablet, Take 81 mg by mouth every evening. Swallow whole., Disp: , Rfl:    atorvastatin (LIPITOR) 10 MG tablet, Take 1 tablet (10 mg total) by mouth every evening., Disp: 90 tablet, Rfl: 4   buPROPion (WELLBUTRIN SR) 150 MG 12 hr tablet, Take 1 tablet (150 mg total) by mouth 2 (two) times daily., Disp: 180 tablet, Rfl: 4   chlorpheniramine-HYDROcodone (TUSSIONEX PENNKINETIC ER) 10-8 MG/5ML SUER, Take 5 mLs by mouth every 12 (twelve) hours as needed for cough., Disp: 115 mL, Rfl: 0   Cholecalciferol (VITAMIN D3) 125 MCG (5000 UT) TABS, Take 5,000 Units by mouth in the morning and at bedtime. Morning & afternoon, Disp: , Rfl:    docusate sodium (COLACE) 100 MG capsule, Take 100 mg by mouth 2 (two) times daily as needed (constipation)., Disp: , Rfl:    furosemide (LASIX) 40 MG tablet, TAKE 1 TABLET(40 MG) BY MOUTH DAILY AS NEEDED, Disp: 90 tablet, Rfl: 4   Glucosamine-Chondroitin (MOVE FREE PO), Take 1 tablet by mouth in the morning and at bedtime. After lunch & evening, Disp: , Rfl:    irbesartan (AVAPRO) 300 MG  tablet, TAKE 1 TABLET(300 MG) BY MOUTH DAILY, Disp: 90 tablet, Rfl: 4   magnesium oxide (MAG-OX) 400 MG tablet, Take 400 mg by mouth 2 (two) times a day. After lunch & 1700, Disp: , Rfl:    mupirocin ointment (BACTROBAN) 2 %, Apply 1 application topically 2 (two) times daily., Disp: 22 g, Rfl: 2   nebivolol (BYSTOLIC) 5 MG tablet, TAKE 1/2 TABLET(2.5 MG) BY MOUTH DAILY, Disp: 90 tablet, Rfl: 0   omeprazole (PRILOSEC) 20 MG capsule, TAKE 1 CAPSULE(20 MG) BY MOUTH DAILY, Disp: 90 capsule, Rfl: 4   QUERCETIN PO, Take 250 mg by mouth daily in the afternoon., Disp: , Rfl:  Tiotropium Bromide Monohydrate (SPIRIVA RESPIMAT) 1.25 MCG/ACT AERS, Inhale 2 puffs into the lungs daily., Disp: 4 g, Rfl: 3   traZODone (DESYREL) 50 MG tablet, TAKE 1 TABLET(50 MG) BY MOUTH AT BEDTIME AS NEEDED FOR SLEEP, Disp: 90 tablet, Rfl: 4   triamcinolone cream (KENALOG) 0.1 %, APPLY TOPICALLY TO THE AFFECTED AREA TWICE DAILY, Disp: 454 g, Rfl: 5   TURMERIC CURCUMIN PO, Take 1 tablet by mouth daily., Disp: , Rfl:    vitamin B-12 (CYANOCOBALAMIN) 500 MCG tablet, Take 500 mcg by mouth daily., Disp: , Rfl:    vitamin E 400 UNIT capsule, Take 400 Units by mouth daily., Disp: , Rfl:    WIXELA INHUB 250-50 MCG/ACT AEPB, INHALE 1 PUFF INTO THE LUNGS TWICE DAILY, Disp: 60 each, Rfl: 1  Observations/Objective: Patient is well-developed, well-nourished in no acute distress.  Resting comfortably  at home. Appears fatigued, but nontoxic Head is normocephalic, atraumatic.  No labored breathing. Speaking in full sentences, no cough or audible wheezes Speech is clear and coherent with logical content.  Patient is alert and oriented at baseline.   Assessment and Plan: 1. Close exposure to COVID-19 virus  2. COVID-19 virus infection  3. Wheezing  4. COPD exacerbation (HCC)   COVID test was positive You should remain isolated in your home for 5 days from symptom onset AND greater than 72 hours after symptoms resolution (absence  of fever without the use of fever-reducing medication and improvement in respiratory symptoms), whichever is longer Get plenty of rest and push fluids Steroid prescribed  Tessalon prescribed Use zyrtec for nasal congestion, runny nose, and/or sore throat Use flonase for nasal congestion and runny nose Use medications daily for symptom relief Use OTC medications like ibuprofen or tylenol as needed fever or pain Follow up with PCP in 1-2 days via phone or e-visit for recheck and to ensure symptoms are improving Call or go to the ED if you have any new or worsening symptoms such as fever, worsening cough, shortness of breath, chest tightness, chest pain, turning blue, changes in mental status, etc...      Follow Up Instructions: I discussed the assessment and treatment plan with the patient. The patient was provided an opportunity to ask questions and all were answered. The patient agreed with the plan and demonstrated an understanding of the instructions.  A copy of instructions were sent to the patient via MyChart unless otherwise noted below.    The patient was advised to call back or seek an in-person evaluation if the symptoms worsen or if the condition fails to improve as anticipated.  Time:  I spent 5-10 minutes with the patient via telehealth technology discussing the above problems/concerns.    Barbara Harding, PA-C

## 2021-05-13 NOTE — Patient Instructions (Signed)
[name], thank you for joining [name], PA-C for today's virtual visit.  While this provider is not your primary care provider (PCP), if your PCP is located in our provider database this encounter information will be shared with them immediately following your visit.  Consent: (Patient) [name] provided verbal consent for this virtual visit at the beginning of the encounter.  Current Medications:  Current Outpatient Medications:    benzonatate (TESSALON) 100 MG capsule, Take 1 capsule (100 mg total) by mouth 2 (two) times daily as needed for cough., Disp: 20 capsule, Rfl: 0   predniSONE (STERAPRED UNI-PAK 21 TAB) 10 MG (21) TBPK tablet, Take by mouth daily. Take 6 tabs by mouth daily  for 2 days, then 5 tabs for 2 days, then 4 tabs for 2 days, then 3 tabs for 2 days, 2 tabs for 2 days, then 1 tab by mouth daily for 2 days, Disp: 42 tablet, Rfl: 0   albuterol (PROVENTIL) (2.5 MG/3ML) 0.083% nebulizer solution, Take 3 mLs (2.5 mg total) by nebulization every 6 (six) hours as needed for wheezing or shortness of breath., Disp: 150 mL, Rfl: 1   albuterol (VENTOLIN HFA) 108 (90 Base) MCG/ACT inhaler, Inhale 2 puffs into the lungs every 6 (six) hours as needed for wheezing or shortness of breath., Disp: 18 g, Rfl: 2   Apple Cider Vinegar 500 MG TABS, Take 500 mg by mouth with breakfast, with lunch, and with evening meal., Disp: , Rfl:    Ascorbic Acid (VITAMIN C) 1000 MG tablet, Take 1,000 mg by mouth daily., Disp: , Rfl:    aspirin EC 81 MG tablet, Take 81 mg by mouth every evening. Swallow whole., Disp: , Rfl:    atorvastatin (LIPITOR) 10 MG tablet, Take 1 tablet (10 mg total) by mouth every evening., Disp: 90 tablet, Rfl: 4   buPROPion (WELLBUTRIN SR) 150 MG 12 hr tablet, Take 1 tablet (150 mg total) by mouth 2 (two) times daily., Disp: 180 tablet, Rfl: 4   chlorpheniramine-HYDROcodone (TUSSIONEX PENNKINETIC ER) 10-8 MG/5ML SUER, Take 5 mLs by mouth every 12 (twelve) hours as needed for  cough., Disp: 115 mL, Rfl: 0   Cholecalciferol (VITAMIN D3) 125 MCG (5000 UT) TABS, Take 5,000 Units by mouth in the morning and at bedtime. Morning & afternoon, Disp: , Rfl:    docusate sodium (COLACE) 100 MG capsule, Take 100 mg by mouth 2 (two) times daily as needed (constipation)., Disp: , Rfl:    furosemide (LASIX) 40 MG tablet, TAKE 1 TABLET(40 MG) BY MOUTH DAILY AS NEEDED, Disp: 90 tablet, Rfl: 4   Glucosamine-Chondroitin (MOVE FREE PO), Take 1 tablet by mouth in the morning and at bedtime. After lunch & evening, Disp: , Rfl:    irbesartan (AVAPRO) 300 MG tablet, TAKE 1 TABLET(300 MG) BY MOUTH DAILY, Disp: 90 tablet, Rfl: 4   magnesium oxide (MAG-OX) 400 MG tablet, Take 400 mg by mouth 2 (two) times a day. After lunch & 1700, Disp: , Rfl:    mupirocin ointment (BACTROBAN) 2 %, Apply 1 application topically 2 (two) times daily., Disp: 22 g, Rfl: 2   nebivolol (BYSTOLIC) 5 MG tablet, TAKE 1/2 TABLET(2.5 MG) BY MOUTH DAILY, Disp: 90 tablet, Rfl: 0   omeprazole (PRILOSEC) 20 MG capsule, TAKE 1 CAPSULE(20 MG) BY MOUTH DAILY, Disp: 90 capsule, Rfl: 4   QUERCETIN PO, Take 250 mg by mouth daily in the afternoon., Disp: , Rfl:    Tiotropium Bromide Monohydrate (SPIRIVA RESPIMAT) 1.25 MCG/ACT AERS,  Inhale 2 puffs into the lungs daily., Disp: 4 g, Rfl: 3   traZODone (DESYREL) 50 MG tablet, TAKE 1 TABLET(50 MG) BY MOUTH AT BEDTIME AS NEEDED FOR SLEEP, Disp: 90 tablet, Rfl: 4   triamcinolone cream (KENALOG) 0.1 %, APPLY TOPICALLY TO THE AFFECTED AREA TWICE DAILY, Disp: 454 g, Rfl: 5   TURMERIC CURCUMIN PO, Take 1 tablet by mouth daily., Disp: , Rfl:    vitamin B-12 (CYANOCOBALAMIN) 500 MCG tablet, Take 500 mcg by mouth daily., Disp: , Rfl:    vitamin E 400 UNIT capsule, Take 400 Units by mouth daily., Disp: , Rfl:    WIXELA INHUB 250-50 MCG/ACT AEPB, INHALE 1 PUFF INTO THE LUNGS TWICE DAILY, Disp: 60 each, Rfl: 1   Medications ordered in this encounter:  Meds ordered this encounter  Medications    predniSONE (STERAPRED UNI-PAK 21 TAB) 10 MG (21) TBPK tablet    Sig: Take by mouth daily. Take 6 tabs by mouth daily  for 2 days, then 5 tabs for 2 days, then 4 tabs for 2 days, then 3 tabs for 2 days, 2 tabs for 2 days, then 1 tab by mouth daily for 2 days    Dispense:  42 tablet    Refill:  0    Order Specific Question:   Supervising Provider    Answer:   Hyacinth Meeker, BRIAN [[id number]]   benzonatate (TESSALON) 100 MG capsule    Sig: Take 1 capsule (100 mg total) by mouth 2 (two) times daily as needed for cough.    Dispense:  20 capsule    Refill:  0    Order Specific Question:   Supervising Provider    Answer:   Hyacinth Meeker, BRIAN [[id number]]     *If you need refills on other medications prior to your next appointment, please contact your pharmacy*  Follow-Up: Call back or seek an in-person evaluation if the symptoms worsen or if the condition fails to improve as anticipated.  Other Instructions  COVID test was positive You should remain isolated in your home for 5 days from symptom onset AND greater than 72 hours after symptoms resolution (absence of fever without the use of fever-reducing medication and improvement in respiratory symptoms), whichever is longer Get plenty of rest and push fluids Steroid prescribed  Tessalon prescribed Use zyrtec for nasal congestion, runny nose, and/or sore throat Use flonase for nasal congestion and runny nose Use medications daily for symptom relief Use OTC medications like ibuprofen or tylenol as needed fever or pain Follow up with PCP in 1-2 days via phone or e-visit for recheck and to ensure symptoms are improving Call or go to the ED if you have any new or worsening symptoms such as fever, worsening cough, shortness of breath, chest tightness, chest pain, turning blue, changes in mental status, etc...      If you have been instructed to have an in-person evaluation today at a local Urgent Care facility, please use the link below. It will take you to a list of  all of our available [facility], including address, phone number and hours of operation. Please do not delay care.  [facility]  If you or a family member do not have a primary care provider, use the link below to schedule a visit and establish care. When you choose a [place] primary care physician or advanced practice provider, you gain a long-term partner in health. Find a Primary Care Provider  Learn more about 's  in-office and virtual care options: [facility] - Get Care Now

## 2021-05-15 ENCOUNTER — Encounter: Payer: Self-pay | Admitting: Nurse Practitioner

## 2021-05-15 ENCOUNTER — Telehealth (INDEPENDENT_AMBULATORY_CARE_PROVIDER_SITE_OTHER): Payer: Medicare PPO | Admitting: Nurse Practitioner

## 2021-05-15 VITALS — BP 147/87 | HR 70 | Temp 91.4°F

## 2021-05-15 DIAGNOSIS — U071 COVID-19: Secondary | ICD-10-CM

## 2021-05-15 MED ORDER — MOLNUPIRAVIR EUA 200MG CAPSULE: 4.0000 | ORAL_CAPSULE | Freq: Two times a day (BID) | ORAL | 0 refills | Status: AC

## 2021-05-15 MED ORDER — HYDROCOD POLST-CPM POLST ER 10-8 MG/5ML PO SUER: 5.0000 mL | Freq: Two times a day (BID) | ORAL | 0 refills | Status: DC | PRN

## 2021-05-15 NOTE — Progress Notes (Signed)
BP (!) 147/87    Pulse 70    Temp (!) 91.4 F (33 C)    LMP  (LMP Unknown)    SpO2 95%    Subjective:    Patient ID: [name], female    DOB: [birth year], 80 y.o.   MRN: [id number]  HPI: [name] is a 80 y.o. female  Chief Complaint  Patient presents with   Covid Positive    Patient states she did not feel good Friday, and then Saturday she realized it was something and her daughter tested her and the at-home test came back positive. Patient states she is still experiencing congestion, coughing, headaches, generalized body-aches and fever/chills. Patient states she has tried over the counter medication. Patient states she called in the [facility] and was requesting an antibiotics. Patient states she was prescribed Prednisone and has completed course of treatment.     This visit was completed via telephone due to the restrictions of the COVID-19 pandemic. All issues as above were discussed and addressed but no physical exam was performed. If it was felt that the patient should be evaluated in the office, they were directed there. The patient verbally consented to this visit. Patient was unable to complete an audio/visual visit due to Technical difficulties, Lack of internet. Due to the catastrophic nature of the COVID-19 pandemic, this visit was done through audio contact only. Location of the patient: home Location of the provider: work Those involved with this call:  Provider: [name], DNP CMA: Irena Reichmann, [place] Desk/Registration: [employer]  Time spent on call:  21 minutes on the phone discussing health concerns. 15 minutes total spent in review of patient's record and preparation of their chart.   COVID POSITIVE Started feeling sick on 05/11/21 at night, nausea and body aches.  Did a self Covid test on Saturday with her daughter's help = this came back positive. Fever: yes 99.7 Cough: yes Shortness of breath: no Wheezing: yes Chest pain: no Chest  tightness: yes Chest congestion: yes Nasal congestion: yes Runny nose: yes Post nasal drip: no Sneezing: no Sore throat: yes Swollen glands: no Sinus pressure: no Headache: yes Face pain: no Toothache: no Ear pain: none Ear pressure: yes bilateral Eyes red/itching:no Eye drainage/crusting: no  Vomiting: no Rash: no Fatigue: yes Sick contacts: yes -- everyone in house Strep contacts: no  Context: better Recurrent sinusitis: no Relief with OTC cold/cough medications: yes  Treatments attempted: Prednisone, Tessalon, nebulizer  Relevant past medical, surgical, family and social history reviewed and updated as indicated. Interim medical history since our last visit reviewed. Allergies and medications reviewed and updated.  Review of Systems  Constitutional:  Positive for fatigue and fever. Negative for activity change, appetite change and chills.  HENT:  Positive for congestion, postnasal drip, rhinorrhea and sore throat. Negative for ear discharge, ear pain, facial swelling, sinus pressure, sinus pain, sneezing and voice change.   Eyes:  Negative for pain and visual disturbance.  Respiratory:  Positive for cough, chest tightness and wheezing. Negative for shortness of breath.   Cardiovascular:  Negative for chest pain, palpitations and leg swelling.  Gastrointestinal: Negative.   Endocrine: Negative.   Neurological:  Positive for headaches. Negative for dizziness and numbness.  Psychiatric/Behavioral: Negative.     Per HPI unless specifically indicated above     Objective:    BP (!) 147/87    Pulse 70    Temp (!) 91.4 F (33 C)    LMP  (  LMP Unknown)    SpO2 95%   Wt Readings from Last 3 Encounters:  11/29/20 (!) 340 lb (154.2 kg)  07/12/20 (!) 320 lb (145.2 kg)  04/17/20 (!) 333 lb (151 kg)    Physical Exam  Unable to assess due to telephone visit only.  Results for orders placed or performed in visit on 10/17/20  CBC with Differential/Platelet  Result Value Ref  Range   WBC 4.6 3.4 - 10.8 x10E3/uL   RBC 4.83 3.77 - 5.28 x10E6/uL   Hemoglobin 14.6 11.1 - 15.9 g/dL   Hematocrit 43.7 34.0 - 46.6 %   MCV 91 79 - 97 fL   MCH 30.2 26.6 - 33.0 pg   MCHC 33.4 31.5 - 35.7 g/dL   RDW 12.7 11.7 - 15.4 %   Platelets 258 150 - 450 x10E3/uL   Neutrophils 54 Not Estab. %   Lymphs 35 Not Estab. %   Monocytes 10 Not Estab. %   Eos 1 Not Estab. %   Basos 0 Not Estab. %   Neutrophils Absolute 2.5 1.4 - 7.0 x10E3/uL   Lymphocytes Absolute 1.6 0.7 - 3.1 x10E3/uL   Monocytes Absolute 0.4 0.1 - 0.9 x10E3/uL   EOS (ABSOLUTE) 0.0 0.0 - 0.4 x10E3/uL   Basophils Absolute 0.0 0.0 - 0.2 x10E3/uL   Immature Granulocytes 0 Not Estab. %   Immature Grans (Abs) 0.0 0.0 - 0.1 x10E3/uL  Comprehensive metabolic panel  Result Value Ref Range   Glucose 113 (H) 65 - 99 mg/dL   BUN 17 8 - 27 mg/dL   Creatinine, Ser 0.87 0.57 - 1.00 mg/dL   eGFR 67 >59 mL/min/1.73   BUN/Creatinine Ratio 20 12 - 28   Sodium 140 134 - 144 mmol/L   Potassium 4.6 3.5 - 5.2 mmol/L   Chloride 107 (H) 96 - 106 mmol/L   CO2 18 (L) 20 - 29 mmol/L   Calcium 10.3 8.7 - 10.3 mg/dL   Total Protein 6.7 6.0 - 8.5 g/dL   Albumin 4.3 3.7 - 4.7 g/dL   Globulin, Total 2.4 1.5 - 4.5 g/dL   Albumin/Globulin Ratio 1.8 1.2 - 2.2   Bilirubin Total 0.2 0.0 - 1.2 mg/dL   Alkaline Phosphatase 92 44 - 121 IU/L   AST 17 0 - 40 IU/L   ALT 14 0 - 32 IU/L  Lipid Panel w/o Chol/HDL Ratio  Result Value Ref Range   Cholesterol, Total 204 (H) 100 - 199 mg/dL   Triglycerides 121 0 - 149 mg/dL   HDL 93 >39 mg/dL   VLDL Cholesterol Cal 21 5 - 40 mg/dL   LDL Chol Calc (NIH) 90 0 - 99 mg/dL  TSH  Result Value Ref Range   TSH 3.440 0.450 - 4.500 uIU/mL  VITAMIN D 25 Hydroxy (Vit-D Deficiency, Fractures)  Result Value Ref Range   Vit D, 25-Hydroxy 56.4 30.0 - 100.0 ng/mL  Vitamin B12  Result Value Ref Range   Vitamin B-12 345 232 - 1,245 pg/mL  Magnesium  Result Value Ref Range   Magnesium 1.7 1.6 - 2.3 mg/dL       Assessment & Plan:   Problem List Items Addressed This Visit       Other   Lab test positive for detection of COVID-19 virus - Primary    Acute on home testing.  Not vaccinated, she was educated on treatment regimens available at length as is on day 5 today.  At this time has decided on [facility] for treatment, script sent.  Tussionex sent.  Recommend hold Prednisone at this time and continue Tessalon and nebulizer.  Will obtain imaging.  Recommend: - Increased rest - Increasing Fluids - Acetaminophen as needed for fever/pain.  - Salt water gargling, chloraseptic spray and throat lozenges - Mucinex.  Return to office in 2 weeks.      Relevant Orders   DG Chest 2 View    I discussed the assessment and treatment plan with the patient. The patient was provided an opportunity to ask questions and all were answered. The patient agreed with the plan and demonstrated an understanding of the instructions.   The patient was advised to call back or seek an in-person evaluation if the symptoms worsen or if the condition fails to improve as anticipated.   I provided 21+ minutes of time during this encounter.   Follow up plan: Return in about 2 weeks (around 05/29/2021) for Covid.

## 2021-05-15 NOTE — Patient Instructions (Signed)
COVID-19: Quarantine and Isolation °Quarantine °If you were exposed °Quarantine and stay away from others when you have been in close contact with someone who has COVID-19. °Isolate °If you are sick or test positive °Isolate when you are sick or when you have COVID-19, even if you don't have symptoms. °When to stay home °Calculating quarantine °The date of your exposure is considered day 0. Day 1 is the first full day after your last contact with a person who has had COVID-19. Stay home and away from other people for at least 5 days. Learn why CDC updated guidance for the general public. °IF YOU were exposed to COVID-19 and are NOT  °up to dateIF YOU were exposed to COVID-19 and are NOT on COVID-19 vaccinations °Quarantine for at least 5 days °Stay home °Stay home and quarantine for at least 5 full days. °Wear a well-fitting mask if you must be around others in your home. °Do not travel. °Get tested °Even if you don't develop symptoms, get tested at least 5 days after you last had close contact with someone with COVID-19. °After quarantine °Watch for symptoms °Watch for symptoms until 10 days after you last had close contact with someone with COVID-19. °Avoid travel °It is best to avoid travel until a full 10 days after you last had close contact with someone with COVID-19. °If you develop symptoms °Isolate immediately and get tested. Continue to stay home until you know the results. Wear a well-fitting mask around others. °Take precautions until day 10 °Wear a well-fitting mask °Wear a well-fitting mask for 10 full days any time you are around others inside your home or in public. Do not go to places where you are unable to wear a well-fitting mask. °If you must travel during days 6-10, take precautions. °Avoid being around people who are more likely to get very sick from COVID-19. °IF YOU were exposed to COVID-19 and are  °up to dateIF YOU were exposed to COVID-19 and are on COVID-19 vaccinations °No  quarantine °You do not need to stay home unless you develop symptoms. °Get tested °Even if you don't develop symptoms, get tested at least 5 days after you last had close contact with someone with COVID-19. °Watch for symptoms °Watch for symptoms until 10 days after you last had close contact with someone with COVID-19. °If you develop symptoms °Isolate immediately and get tested. Continue to stay home until you know the results. Wear a well-fitting mask around others. °Take precautions until day 10 °Wear a well-fitting mask °Wear a well-fitting mask for 10 full days any time you are around others inside your home or in public. Do not go to places where you are unable to wear a well-fitting mask. °Take precautions if traveling °Avoid being around people who are more likely to get very sick from COVID-19. °IF YOU were exposed to COVID-19 and had confirmed COVID-19 within the past 90 days (you tested positive using a viral test) °No quarantine °You do not need to stay home unless you develop symptoms. °Watch for symptoms °Watch for symptoms until 10 days after you last had close contact with someone with COVID-19. °If you develop symptoms °Isolate immediately and get tested. Continue to stay home until you know the results. Wear a well-fitting mask around others. °Take precautions until day 10 °Wear a well-fitting mask °Wear a well-fitting mask for 10 full days any time you are around others inside your home or in public. Do not go to places where you are   unable to wear a well-fitting mask. °Take precautions if traveling °Avoid being around people who are more likely to get very sick from COVID-19. °Calculating isolation °Day 0 is your first day of symptoms or a positive viral test. Day 1 is the first full day after your symptoms developed or your test specimen was collected. If you have COVID-19 or have symptoms, isolate for at least 5 days. °IF YOU tested positive for COVID-19 or have symptoms, regardless of  vaccination status °Stay home for at least 5 days °Stay home for 5 days and isolate from others in your home. °Wear a well-fitting mask if you must be around others in your home. °Do not travel. °Ending isolation if you had symptoms °End isolation after 5 full days if you are fever-free for 24 hours (without the use of fever-reducing medication) and your symptoms are improving. °Ending isolation if you did NOT have symptoms °End isolation after at least 5 full days after your positive test. °If you got very sick from COVID-19 or have a weakened immune system °You should isolate for at least 10 days. Consult your doctor before ending isolation. °Take precautions until day 10 °Wear a well-fitting mask °Wear a well-fitting mask for 10 full days any time you are around others inside your home or in public. Do not go to places where you are unable to wear a well-fitting mask. °Do not travel °Do not travel until a full 10 days after your symptoms started or the date your positive test was taken if you had no symptoms. °Avoid being around people who are more likely to get very sick from COVID-19. °Definitions °Exposure °Contact with someone infected with SARS-CoV-2, the virus that causes COVID-19, in a way that increases the likelihood of getting infected with the virus. °Close contact °A close contact is someone who was less than 6 feet away from an infected person (laboratory-confirmed or a clinical diagnosis) for a cumulative total of 15 minutes or more over a 24-hour period. For example, three individual 5-minute exposures for a total of 15 minutes. People who are exposed to someone with COVID-19 after they completed at least 5 days of isolation are not considered close contacts. °Quarantine °Quarantine is a strategy used to prevent transmission of COVID-19 by keeping people who have been in close contact with someone with COVID-19 apart from others. °Who does not need to quarantine? °If you had close contact with  someone with COVID-19 and you are in one of the following groups, you do not need to quarantine. °You are up to date with your COVID-19 vaccines. °You had confirmed COVID-19 within the last 90 days (meaning you tested positive using a viral test). °If you are up to date with COVID-19 vaccines, you should wear a well-fitting mask around others for 10 days from the date of your last close contact with someone with COVID-19 (the date of last close contact is considered day 0). Get tested at least 5 days after you last had close contact with someone with COVID-19. If you test positive or develop COVID-19 symptoms, isolate from other people and follow recommendations in the Isolation section below. If you tested positive for COVID-19 with a viral test within the previous 90 days and subsequently recovered and remain without COVID-19 symptoms, you do not need to quarantine or get tested after close contact. You should wear a well-fitting mask around others for 10 days from the date of your last close contact with someone with COVID-19 (the date of last   close contact is considered day 0). If you have COVID-19 symptoms, get tested and isolate from other people and follow recommendations in the Isolation section below. °Who should quarantine? °If you come into close contact with someone with COVID-19, you should quarantine if you are not up to date on COVID-19 vaccines. This includes people who are not vaccinated. °What to do for quarantine °Stay home and away from other people for at least 5 days (day 0 through day 5) after your last contact with a person who has COVID-19. The date of your exposure is considered day 0. Wear a well-fitting mask when around others at home, if possible. °For 10 days after your last close contact with someone with COVID-19, watch for fever (100.4°F or greater), cough, shortness of breath, or other COVID-19 symptoms. °If you develop symptoms, get tested immediately and isolate until you receive  your test results. If you test positive, follow isolation recommendations. °If you do not develop symptoms, get tested at least 5 days after you last had close contact with someone with COVID-19. °If you test negative, you can leave your home, but continue to wear a well-fitting mask when around others at home and in public until 10 days after your last close contact with someone with COVID-19. °If you test positive, you should isolate for at least 5 days from the date of your positive test (if you do not have symptoms). If you do develop COVID-19 symptoms, isolate for at least 5 days from the date your symptoms began (the date the symptoms started is day 0). Follow recommendations in the isolation section below. °If you are unable to get a test 5 days after last close contact with someone with COVID-19, you can leave your home after day 5 if you have been without COVID-19 symptoms throughout the 5-day period. Wear a well-fitting mask for 10 days after your date of last close contact when around others at home and in public. °Avoid people who are have weakened immune systems or are more likely to get very sick from COVID-19, and nursing homes and other high-risk settings, until after at least 10 days. °If possible, stay away from people you live with, especially people who are at higher risk for getting very sick from COVID-19, as well as others outside your home throughout the full 10 days after your last close contact with someone with COVID-19. °If you are unable to quarantine, you should wear a well-fitting mask for 10 days when around others at home and in public. °If you are unable to wear a mask when around others, you should continue to quarantine for 10 days. Avoid people who have weakened immune systems or are more likely to get very sick from COVID-19, and nursing homes and other high-risk settings, until after at least 10 days. °See additional information about travel. °Do not go to places where you are  unable to wear a mask, such as restaurants and some gyms, and avoid eating around others at home and at work until after 10 days after your last close contact with someone with COVID-19. °After quarantine °Watch for symptoms until 10 days after your last close contact with someone with COVID-19. °If you have symptoms, isolate immediately and get tested. °Quarantine in high-risk congregate settings °In certain congregate settings that have high risk of secondary transmission (such as correctional and detention facilities, homeless shelters, or cruise ships), CDC recommends a 10-day quarantine for residents, regardless of vaccination and booster status. During periods of critical staffing   shortages, facilities may consider shortening the quarantine period for staff to ensure continuity of operations. Decisions to shorten quarantine in these settings should be made in consultation with state, local, tribal, or territorial health departments and should take into consideration the context and characteristics of the facility. CDC's setting-specific guidance provides additional recommendations for these settings. °Isolation °Isolation is used to separate people with confirmed or suspected COVID-19 from those without COVID-19. People who are in isolation should stay home until it's safe for them to be around others. At home, anyone sick or infected should separate from others, or wear a well-fitting mask when they need to be around others. People in isolation should stay in a specific "sick room" or area and use a separate bathroom if available. Everyone who has presumed or confirmed COVID-19 should stay home and isolate from other people for at least 5 full days (day 0 is the first day of symptoms or the date of the day of the positive viral test for asymptomatic persons). They should wear a mask when around others at home and in public for an additional 5 days. People who are confirmed to have COVID-19 or are showing  symptoms of COVID-19 need to isolate regardless of their vaccination status. This includes: °People who have a positive viral test for COVID-19, regardless of whether or not they have symptoms. °People with symptoms of COVID-19, including people who are awaiting test results or have not been tested. People with symptoms should isolate even if they do not know if they have been in close contact with someone with COVID-19. °What to do for isolation °Monitor your symptoms. If you have an emergency warning sign (including trouble breathing), seek emergency medical care immediately. °Stay in a separate room from other household members, if possible. °Use a separate bathroom, if possible. °Take steps to improve ventilation at home, if possible. °Avoid contact with other members of the household and pets. °Don't share personal household items, like cups, towels, and utensils. °Wear a well-fitting mask when you need to be around other people. °Learn more about what to do if you are sick and how to notify your contacts. °Ending isolation for people who had COVID-19 and had symptoms °If you had COVID-19 and had symptoms, isolate for at least 5 days. To calculate your 5-day isolation period, day 0 is your first day of symptoms. Day 1 is the first full day after your symptoms developed. You can leave isolation after 5 full days. °You can end isolation after 5 full days if you are fever-free for 24 hours without the use of fever-reducing medication and your other symptoms have improved (Loss of taste and smell may persist for weeks or months after recovery and need not delay the end of isolation). °You should continue to wear a well-fitting mask around others at home and in public for 5 additional days (day 6 through day 10) after the end of your 5-day isolation period. If you are unable to wear a mask when around others, you should continue to isolate for a full 10 days. Avoid people who have weakened immune systems or are more  likely to get very sick from COVID-19, and nursing homes and other high-risk settings, until after at least 10 days. °If you continue to have fever or your other symptoms have not improved after 5 days of isolation, you should wait to end your isolation until you are fever-free for 24 hours without the use of fever-reducing medication and your other symptoms have improved.   Continue to wear a well-fitting mask through day 10. Contact your healthcare provider if you have questions. °See additional information about travel. °Do not go to places where you are unable to wear a mask, such as restaurants and some gyms, and avoid eating around others at home and at work until a full 10 days after your first day of symptoms. °If an individual has access to a test and wants to test, the best approach is to use an antigen test1 towards the end of the 5-day isolation period. Collect the test sample only if you are fever-free for 24 hours without the use of fever-reducing medication and your other symptoms have improved (loss of taste and smell may persist for weeks or months after recovery and need not delay the end of isolation). If your test result is positive, you should continue to isolate until day 10. If your test result is negative, you can end isolation, but continue to wear a well-fitting mask around others at home and in public until day 10. Follow additional recommendations for masking and avoiding travel as described above. °1As noted in the labeling for authorized over-the counter antigen tests: Negative results should be treated as presumptive. Negative results do not rule out SARS-CoV-2 infection and should not be used as the sole basis for treatment or patient management decisions, including infection control decisions. To improve results, antigen tests should be used twice over a three-day period with at least 24 hours and no more than 48 hours between tests. °Note that these recommendations on ending isolation  do not apply to people who are moderately ill or very sick from COVID-19 or have weakened immune systems. See section below for recommendations for when to end isolation for these groups. °Ending isolation for people who tested positive for COVID-19 but had no symptoms °If you test positive for COVID-19 and never develop symptoms, isolate for at least 5 days. Day 0 is the day of your positive viral test (based on the date you were tested) and day 1 is the first full day after the specimen was collected for your positive test. You can leave isolation after 5 full days. °If you continue to have no symptoms, you can end isolation after at least 5 days. °You should continue to wear a well-fitting mask around others at home and in public until day 10 (day 6 through day 10). If you are unable to wear a mask when around others, you should continue to isolate for 10 days. Avoid people who have weakened immune systems or are more likely to get very sick from COVID-19, and nursing homes and other high-risk settings, until after at least 10 days. °If you develop symptoms after testing positive, your 5-day isolation period should start over. Day 0 is your first day of symptoms. Follow the recommendations above for ending isolation for people who had COVID-19 and had symptoms. °See additional information about travel. °Do not go to places where you are unable to wear a mask, such as restaurants and some gyms, and avoid eating around others at home and at work until 10 days after the day of your positive test. °If an individual has access to a test and wants to test, the best approach is to use an antigen test1 towards the end of the 5-day isolation period. If your test result is positive, you should continue to isolate until day 10. If your test result is positive, you can also choose to test daily and if your test result   is negative, you can end isolation, but continue to wear a well-fitting mask around others at home and in  public until day 10. Follow additional recommendations for masking and avoiding travel as described above. °1As noted in the labeling for authorized over-the counter antigen tests: Negative results should be treated as presumptive. Negative results do not rule out SARS-CoV-2 infection and should not be used as the sole basis for treatment or patient management decisions, including infection control decisions. To improve results, antigen tests should be used twice over a three-day period with at least 24 hours and no more than 48 hours between tests. °Ending isolation for people who were moderately or very sick from COVID-19 or have a weakened immune system °People who are moderately ill from COVID-19 (experiencing symptoms that affect the lungs like shortness of breath or difficulty breathing) should isolate for 10 days and follow all other isolation precautions. To calculate your 10-day isolation period, day 0 is your first day of symptoms. Day 1 is the first full day after your symptoms developed. If you are unsure if your symptoms are moderate, talk to a healthcare provider for further guidance. °People who are very sick from COVID-19 (this means people who were hospitalized or required intensive care or ventilation support) and people who have weakened immune systems might need to isolate at home longer. They may also require testing with a viral test to determine when they can be around others. CDC recommends an isolation period of at least 10 and up to 20 days for people who were very sick from COVID-19 and for people with weakened immune systems. Consult with your healthcare provider about when you can resume being around other people. If you are unsure if your symptoms are severe or if you have a weakened immune system, talk to a healthcare provider for further guidance. °People who have a weakened immune system should talk to their healthcare provider about the potential for reduced immune responses to  COVID-19 vaccines and the need to continue to follow current prevention measures (including wearing a well-fitting mask and avoiding crowds and poorly ventilated indoor spaces) to protect themselves against COVID-19 until advised otherwise by their healthcare provider. Close contacts of immunocompromised people--including household members--should also be encouraged to receive all recommended COVID-19 vaccine doses to help protect these people. °Isolation in high-risk congregate settings °In certain high-risk congregate settings that have high risk of secondary transmission and where it is not feasible to cohort people (such as correctional and detention facilities, homeless shelters, and cruise ships), CDC recommends a 10-day isolation period for residents. During periods of critical staffing shortages, facilities may consider shortening the isolation period for staff to ensure continuity of operations. Decisions to shorten isolation in these settings should be made in consultation with state, local, tribal, or territorial health departments and should take into consideration the context and characteristics of the facility. CDC's setting-specific guidance provides additional recommendations for these settings. °This CDC guidance is meant to supplement--not replace--any federal, state, local, territorial, or tribal health and safety laws, rules, and regulations. °Recommendations for specific settings °These recommendations do not apply to healthcare professionals. For guidance specific to these settings, see °Healthcare professionals: Interim Guidance for Managing Healthcare Personnel with SARS-CoV-2 Infection or Exposure to SARS-CoV-2 °Patients, residents, and visitors to healthcare settings: Interim Infection Prevention and Control Recommendations for Healthcare Personnel During the Coronavirus Disease 2019 (COVID-19) Pandemic °Additional setting-specific guidance and recommendations are available. °These  recommendations on quarantine and isolation do apply to K[employer] School   settings. Additional guidance is available here: Overview of COVID-19 Quarantine for K-12 Schools °Travelers: Travel information and recommendations °Congregate facilities and other settings: guidance pages for community, work, and school settings °Ongoing COVID-19 exposure FAQs °I live with someone with COVID-19, but I cannot be separated from them. How do we manage quarantine in this situation? °It is very important for people with COVID-19 to remain apart from other people, if possible, even if they are living together. If separation of the person with COVID-19 from others that they live with is not possible, the other people that they live with will have ongoing exposure, meaning they will be repeatedly exposed until that person is no longer able to spread the virus to other people. In this situation, there are precautions you can take to limit the spread of COVID-19: °The person with COVID-19 and everyone they live with should wear a well-fitting mask inside the home. °If possible, one person should care for the person with COVID-19 to limit the number of people who are in close contact with the infected person. °Take steps to protect yourself and others to reduce transmission in the home: °Quarantine if you are not up to date with your COVID-19 vaccines. °Isolate if you are sick or tested positive for COVID-19, even if you don't have symptoms. °Learn more about the public health recommendations for testing, mask use and quarantine of close contacts, like yourself, who have ongoing exposure. These recommendations differ depending on your vaccination status. °What should I do if I have ongoing exposure to COVID-19 from someone I live with? °Recommendations for this situation depend on your vaccination status: °If you are not up to date on COVID-19 vaccines and have ongoing exposure to COVID-19, you should: °Begin quarantine immediately and  continue to quarantine throughout the isolation period of the person with COVID-19. °Continue to quarantine for an additional 5 days starting the day after the end of isolation for the person with COVID-19. °Get tested at least 5 days after the end of isolation of the infected person that lives with them. °If you test negative, you can leave the home but should continue to wear a well-fitting mask when around others at home and in public until 10 days after the end of isolation for the person with COVID-19. °Isolate immediately if you develop symptoms of COVID-19 or test positive. °If you are up to date with COVID-19 vaccines and have ongoing exposure to COVID-19, you should: °Get tested at least 5 days after your first exposure. A person with COVID-19 is considered infectious starting 2 days before they develop symptoms, or 2 days before the date of their positive test if they do not have symptoms. °Get tested again at least 5 days after the end of isolation for the person with COVID-19. °Wear a well-fitting mask when you are around the person with COVID-19, and do this throughout their isolation period. °Wear a well-fitting mask around others for 10 days after the infected person's isolation period ends. °Isolate immediately if you develop symptoms of COVID-19 or test positive. °What should I do if multiple people I live with test positive for COVID-19 at different times? °Recommendations for this situation depend on your vaccination status: °If you are not up to date with your COVID-19 vaccines, you should: °Quarantine throughout the isolation period of any infected person that you live with. °Continue to quarantine until 5 days after the end of isolation date for the most recently infected person that lives with you. For example, if   the last day of isolation of the person most recently infected with COVID-19 was June 30, the new 5-day quarantine period starts on July 1. °Get tested at least 5 days after the end  of isolation for the most recently infected person that lives with you. °Wear a well-fitting mask when you are around any person with COVID-19 while that person is in isolation. °Wear a well-fitting mask when you are around other people until 10 days after your last close contact. °Isolate immediately if you develop symptoms of COVID-19 or test positive. °If you are up to date with your COVID-19 vaccines, you should: °Get tested at least 5 days after your first exposure. A person with COVID-19 is considered infectious starting 2 days before they developed symptoms, or 2 days before the date of their positive test if they do not have symptoms. °Get tested again at least 5 days after the end of isolation for the most recently infected person that lives with you. °Wear a well-fitting mask when you are around any person with COVID-19 while that person is in isolation. °Wear a well-fitting mask around others for 10 days after the end of isolation for the most recently infected person that lives with you. For example, if the last day of isolation for the person most recently infected with COVID-19 was June 30, the new 10-day period to wear a well-fitting mask indoors in public starts on July 1. °Isolate immediately if you develop symptoms of COVID-19 or test positive. °I had COVID-19 and completed isolation. Do I have to quarantine or get tested if someone I live with gets COVID-19 shortly after I completed isolation? °No. If you recently completed isolation and someone that lives with you tests positive for the virus that causes COVID-19 shortly after the end of your isolation period, you do not have to quarantine or get tested as long as you do not develop new symptoms. Once all of the people that live together have completed isolation or quarantine, refer to the guidance below for new exposures to COVID-19. °If you had COVID-19 in the previous 90 days and then came into close contact with someone with COVID-19, you do  not have to quarantine or get tested if you do not have symptoms. But you should: °Wear a well-fitting mask indoors in public for 10 days after your last close contact. °Monitor for COVID-19 symptoms for 10 days from the date of your last close contact. °Isolate immediately and get tested if symptoms develop. °If more than 90 days have passed since your recovery from infection, follow CDC's recommendations for close contacts. These recommendations will differ depending on your vaccination status. °08/16/2020 °Content source: National Center for Immunization and Respiratory Diseases (NCIRD), Division of Viral Diseases °This information is not intended to replace advice given to you by your health care provider. Make sure you discuss any questions you have with your health care provider. °Document Revised: 12/20/2020 Document Reviewed: 12/20/2020 °Elsevier Patient Education © 2022 Elsevier Inc. ° °

## 2021-05-15 NOTE — Assessment & Plan Note (Signed)
Acute on home testing.  Not vaccinated, she was educated on treatment regimens available at length as is on day 5 today.  At this time has decided on [facility] for treatment, script sent.  Tussionex sent.  Recommend hold Prednisone at this time and continue Tessalon and nebulizer.  Will obtain imaging.  Recommend: - Increased rest - Increasing Fluids - Acetaminophen as needed for fever/pain.  - Salt water gargling, chloraseptic spray and throat lozenges - Mucinex.  Return to office in 2 weeks.

## 2021-05-18 ENCOUNTER — Ambulatory Visit: Payer: Self-pay

## 2021-05-18 NOTE — Telephone Encounter (Signed)
Patient called in says still has chest congestion, wants to know if Dr Harvest Dark can send her stronger medicine to, [id number] - [place], [place] - [address] AT [facility] [address] [facility]  Phone: [phone]  Fax: [phone]    Chief Complaint: Productive cough with green mucus Symptoms: Cough, wheezing - using nebulizer Frequency: Started Wednesday Pertinent Negatives: Patient denies fever Disposition: [] ED /[] Urgent Care (no appt availability in office) / [] Appointment(In office/virtual)/ []  [facility]/ [] Home Care/ [] Refused Recommended Disposition /[] [place] [facility]/ []  Follow-up with PCP Additional Notes: Pt. Seen this week with cough. Now has green mucus and "I can't get much up. I'm using my nebulizer but I think I need some medicine for this." Please advise pt.  Answer Assessment - Initial Assessment Questions 1. ONSET: "When did the cough begin?"      Wednesday 2. SEVERITY: "How bad is the cough today?"      Severe 3. SPUTUM: "Describe the color of your sputum" (none, dry cough; clear, white, yellow, green)     Yellow-green 4. HEMOPTYSIS: "Are you coughing up any blood?" If so ask: "How much?" (flecks, streaks, tablespoons, etc.)     NO 5. DIFFICULTY BREATHING: "Are you having difficulty breathing?" If Yes, ask: "How bad is it?" (e.g., mild, moderate, severe)    - MILD: No SOB at rest, mild SOB with walking, speaks normally in sentences, can lie down, no retractions, pulse < 100.    - MODERATE: SOB at rest, SOB with minimal exertion and prefers to sit, cannot lie down flat, speaks in phrases, mild retractions, audible wheezing, pulse 100-120.    - SEVERE: Very SOB at rest, speaks in single words, struggling to breathe, sitting hunched forward, retractions, pulse > 120      No 6. FEVER: "Do you have a fever?" If Yes, ask: "What is your temperature, how was it measured, and when did it start?"     No 7. CARDIAC HISTORY: "Do you have any  history of heart disease?" (e.g., heart attack, congestive heart failure)      No 8. LUNG HISTORY: "Do you have any history of lung disease?"  (e.g., pulmonary embolus, asthma, emphysema)     No 9. PE RISK FACTORS: "Do you have a history of blood clots?" (or: recent major surgery, recent prolonged travel, bedridden)     No 10. OTHER SYMPTOMS: "Do you have any other symptoms?" (e.g., runny nose, wheezing, chest pain)       Wheezing 11. PREGNANCY: "Is there any chance you are pregnant?" "When was your last menstrual period?"       No 12. TRAVEL: "Have you traveled out of the country in the last month?" (e.g., travel history, exposures)       No  Protocols used: Cough - Acute Productive-A-AH

## 2021-05-18 NOTE — Telephone Encounter (Signed)
Routing to provider to advise patient.

## 2021-05-18 NOTE — Telephone Encounter (Signed)
Patient states cough medicine is working well, was only calling to let provider know of mucus turning green. Per provider continue to take medications.

## 2021-05-22 ENCOUNTER — Ambulatory Visit
Admission: RE | Admit: 2021-05-22 | Discharge: 2021-05-22 | Disposition: A | Discharge status: Home / Self Care | Payer: Medicare PPO | Source: Ambulatory Visit | Attending: Nurse Practitioner | Admitting: Nurse Practitioner

## 2021-05-22 ENCOUNTER — Other Ambulatory Visit: Payer: Self-pay

## 2021-05-22 ENCOUNTER — Ambulatory Visit
Admission: RE | Admit: 2021-05-22 | Discharge: 2021-05-22 | Disposition: A | Discharge status: Home / Self Care | Payer: Medicare PPO | Attending: Nurse Practitioner | Admitting: Nurse Practitioner

## 2021-05-22 DIAGNOSIS — R059 Cough, unspecified: Secondary | ICD-10-CM | POA: Diagnosis not present

## 2021-05-22 DIAGNOSIS — U071 COVID-19: Secondary | ICD-10-CM | POA: Insufficient documentation

## 2021-05-22 DIAGNOSIS — J9811 Atelectasis: Secondary | ICD-10-CM | POA: Diagnosis not present

## 2021-05-22 DIAGNOSIS — I517 Cardiomegaly: Secondary | ICD-10-CM | POA: Diagnosis not present

## 2021-05-23 ENCOUNTER — Telehealth: Payer: Self-pay | Admitting: Nurse Practitioner

## 2021-05-23 ENCOUNTER — Encounter: Payer: Self-pay | Admitting: Nurse Practitioner

## 2021-05-23 MED ORDER — NEBIVOLOL HCL 5 MG PO TABS: 5.0000 mg | ORAL_TABLET | Freq: Every day | ORAL | 4 refills | Status: DC

## 2021-05-23 NOTE — Progress Notes (Signed)
Contacted via MyChart   Good morning [name], overall no pneumonia seen on imaging.  You do have some atelectasis, this can be from the cough and is a mild lowering of lung field.  I would recommend ensuring you are doing some deep breathing exercises at home to increase lung expansion and prevent pneumonia.  How is your cough?  Improving? Any questions? Keep being awesome!!  Thank you for allowing me to participate in your care.  I appreciate you. Kindest regards, Adriella Essex

## 2021-05-23 NOTE — Telephone Encounter (Signed)
Medication Refill - Medication: nebivolol (BYSTOLIC) 5 MG tablet Pt stated insurance is not paying for medication because it is being sent as 1/2 TABLET(2.5 MG)  Pt stated she needs a full tablet. Pt has been out of the medication for a week.  Has the patient contacted their pharmacy? No. She wanted to contact PCP first.  (Agent: If no, request that the patient contact the pharmacy for the refill. If patient does not wish to contact the pharmacy document the reason why and proceed with request.)   Preferred Pharmacy (with phone number or street name):   [facility] [place], [place] - [address]  [place] [address]  Phone: [phone] Fax: [phone]  Hours: Not open 24 hours    Has the patient been seen for an appointment in the last year OR does the patient have an upcoming appointment? Yes.    Agent: Please be advised that RX refills may take up to 3 business days. We ask that you follow-up with your pharmacy.

## 2021-05-24 ENCOUNTER — Ambulatory Visit (INDEPENDENT_AMBULATORY_CARE_PROVIDER_SITE_OTHER): Payer: Medicare PPO | Admitting: *Deleted

## 2021-05-24 DIAGNOSIS — Z0000 Encounter for general adult medical examination without abnormal findings: Secondary | ICD-10-CM

## 2021-05-24 NOTE — Progress Notes (Signed)
Subjective:   [name] is a 81 y.o. female who presents for Medicare Annual (Subsequent) preventive examination.  I connected with  Ashana Tullo Bainter on 05/24/21 by a telephone enabled telemedicine application and verified that I am speaking with the correct person using two identifiers.   I discussed the limitations of evaluation and management by telemedicine. The patient expressed understanding and agreed to proceed.  Patient location: home  Provider location:  Tele-health  not in office    Review of Systems     Cardiac Risk Factors include: advanced age (>26men, >50 women);hypertension;obesity (BMI >30kg/m2)     Objective:    Today's Vitals   There is no height or weight on file to calculate BMI.  Advanced Directives 05/24/2021 07/27/2020 04/17/2020 04/12/2019 01/14/2018 01/01/2018 12/04/2016  Does Patient Have a Medical Advance Directive? No No No No No No No  Would patient like information on creating a medical advance directive? No - Patient declined No - Patient declined - - - Yes (MAU/Ambulatory/Procedural Areas - Information given) No - Patient declined    Current Medications (verified) Outpatient Encounter Medications as of 05/24/2021  Medication Sig   albuterol (PROVENTIL) (2.5 MG/3ML) 0.083% nebulizer solution Take 3 mLs (2.5 mg total) by nebulization every 6 (six) hours as needed for wheezing or shortness of breath.   albuterol (VENTOLIN HFA) 108 (90 Base) MCG/ACT inhaler Inhale 2 puffs into the lungs every 6 (six) hours as needed for wheezing or shortness of breath.   Apple Cider Vinegar 500 MG TABS Take 500 mg by mouth with breakfast, with lunch, and with evening meal.   Ascorbic Acid (VITAMIN C) 1000 MG tablet Take 1,000 mg by mouth daily.   aspirin EC 81 MG tablet Take 81 mg by mouth every evening. Swallow whole.   atorvastatin (LIPITOR) 10 MG tablet Take 1 tablet (10 mg total) by mouth every evening.   benzonatate (TESSALON) 100 MG capsule Take 1 capsule (100 mg  total) by mouth 2 (two) times daily as needed for cough.   buPROPion (WELLBUTRIN SR) 150 MG 12 hr tablet Take 1 tablet (150 mg total) by mouth 2 (two) times daily.   chlorpheniramine-HYDROcodone (TUSSIONEX PENNKINETIC ER) 10-8 MG/5ML SUER Take 5 mLs by mouth every 12 (twelve) hours as needed for cough.   Cholecalciferol (VITAMIN D3) 125 MCG (5000 UT) TABS Take 5,000 Units by mouth in the morning and at bedtime. Morning & afternoon   docusate sodium (COLACE) 100 MG capsule Take 100 mg by mouth 2 (two) times daily as needed (constipation).   furosemide (LASIX) 40 MG tablet TAKE 1 TABLET(40 MG) BY MOUTH DAILY AS NEEDED   Glucosamine-Chondroitin (MOVE FREE PO) Take 1 tablet by mouth in the morning and at bedtime. After lunch & evening   irbesartan (AVAPRO) 300 MG tablet TAKE 1 TABLET(300 MG) BY MOUTH DAILY   magnesium oxide (MAG-OX) 400 MG tablet Take 400 mg by mouth 2 (two) times a day. After lunch & 1700   mupirocin ointment (BACTROBAN) 2 % Apply 1 application topically 2 (two) times daily.   nebivolol (BYSTOLIC) 5 MG tablet Take 1 tablet (5 mg total) by mouth daily.   omeprazole (PRILOSEC) 20 MG capsule TAKE 1 CAPSULE(20 MG) BY MOUTH DAILY   QUERCETIN PO Take 250 mg by mouth daily in the afternoon.   Tiotropium Bromide Monohydrate (SPIRIVA RESPIMAT) 1.25 MCG/ACT AERS Inhale 2 puffs into the lungs daily.   traZODone (DESYREL) 50 MG tablet TAKE 1 TABLET(50 MG) BY MOUTH AT BEDTIME AS  NEEDED FOR SLEEP   triamcinolone cream (KENALOG) 0.1 % APPLY TOPICALLY TO THE AFFECTED AREA TWICE DAILY   TURMERIC CURCUMIN PO Take 1 tablet by mouth daily.   vitamin B-12 (CYANOCOBALAMIN) 500 MCG tablet Take 500 mcg by mouth daily.   vitamin E 400 UNIT capsule Take 400 Units by mouth daily.   WIXELA INHUB 250-50 MCG/ACT AEPB INHALE 1 PUFF INTO THE LUNGS TWICE DAILY   No facility-administered encounter medications on file as of 05/24/2021.    Allergies (verified) Benicar [olmesartan], Latex, Ace inhibitors, Codeine,  Ciprofloxacin, Diovan [valsartan], Flagyl [metronidazole], Lotensin [benazepril hcl], Penicillins, and Sulfa antibiotics   History: Past Medical History:  Diagnosis Date   Allergy    Anemia    vitamin d deficiency   Arthritis    Asthma    Chronic fatigue syndrome    COPD (chronic obstructive pulmonary disease) (HCC)    COVID 05/2020   Diverticulosis    Dyspnea    Fractured fibula    GERD (gastroesophageal reflux disease)    Gout    Headache    migraines   History of blood transfusion    Hypertension    Lymphedema 2004   Neuromuscular disorder (HCC)    Pneumonia    Shingles    Sleep apnea    cpap   Past Surgical History:  Procedure Laterality Date   ABDOMINAL HYSTERECTOMY  1974   BIOPSY THYROID     CATARACT EXTRACTION W/PHACO Left 07/27/2020   Procedure: CATARACT EXTRACTION PHACO AND INTRAOCULAR LENS PLACEMENT (IOC) LEFT DIABETIC;  Surgeon: Nevada CraneKing, Bradley Mark, MD;  Location: [facility] ORS;  Service: Ophthalmology;  Laterality: Left;  Lot #[id number] H US; 01:04.8 CDE: 8.86   CHOLECYSTECTOMY  2005   Family History  Problem Relation Age of Onset   Heart disease Mother    Hyperlipidemia Mother    Diabetes Father    Alcoholism Father    Cancer Brother    Hypertension Son    Stroke Maternal Grandmother    Social History   Socioeconomic History   Marital status: Widowed    Spouse name: Not on file   Number of children: Not on file   Years of education: 14 years   Highest education level: Some college, no degree  Occupational History   Not on file  Tobacco Use   Smoking status: Never   Smokeless tobacco: Never  Vaping Use   Vaping Use: Never used  Substance and Sexual Activity   Alcohol use: No    Alcohol/week: 0.0 standard drinks   Drug use: No   Sexual activity: Not Currently  Other Topics Concern   Not on file  Social History Narrative   Card making club.       Working full time at [employer]    Social Determinants of Research scientist (physical sciences)Health   Financial  Resource Strain: Not on [employer] Corporationfile  Food Insecurity: Not on file  Transportation Needs: Not on file  Physical Activity: Insufficiently Active   Days of Exercise per Week: 1 day   Minutes of Exercise per Session: 20 min  Stress: Not on file  Social Connections: Moderately Integrated   Frequency of Communication with Friends and Family: More than three times a week   Frequency of Social Gatherings with Friends and Family: More than three times a week   Attends Religious Services: More than 4 times per year   Active Member of [employer]ubs or Organizations: Yes   Attends BankerClub or Organization Meetings: More than 4 times per year  Marital Status: Widowed    Tobacco Counseling Counseling given: Not Answered   Clinical Intake:  Pre-visit preparation completed: Yes  Pain : No/denies pain     Nutritional Risks: None Diabetes: No  How often do you need to have someone help you when you read instructions, pamphlets, or other written materials from your doctor or pharmacy?: 1 - Never  Diabetic?  no  Interpreter Needed?: No  Information entered by :: Remi Haggard LPN   Activities of Daily Living In your present state of health, do you have any difficulty performing the following activities: 05/24/2021  Hearing? N  Vision? N  Difficulty concentrating or making decisions? N  Walking or climbing stairs? Y  Dressing or bathing? N  Doing errands, shopping? N  Preparing Food and eating ? N  Using the Toilet? N  In the past six months, have you accidently leaked urine? N  Do you have problems with loss of bowel control? N  Managing your Medications? N  Managing your Finances? N  Housekeeping or managing your Housekeeping? Y  Some recent data might be hidden    Patient Care Team: Marjie Skiff, NP as PCP - General (Nurse Practitioner) Erin Fulling, MD as Consulting Physician (Pulmonary Disease) Antonieta Iba, MD as Consulting Physician (Cardiology) Gustavus Bryant, LCSW as Social  Worker (Licensed Clinical Social Worker) Minor, Theadora Rama, RN (Inactive) as Case Manager  Indicate any recent Medical Services you may have received from other than [facility] providers in the past year (date may be approximate).     Assessment:   This is a routine wellness examination for Malaiya.  Hearing/Vision screen Hearing Screening - Comments:: No trouble hearing Vision Screening - Comments:: Dr. Brooke Dare  Cataracts removed 2022 Up to date  Dietary issues and exercise activities discussed: Current Exercise Habits: Home exercise routine, Time (Minutes): 20, Frequency (Times/Week): 1, Weekly Exercise (Minutes/Week): 20, Intensity: Mild   Goals Addressed             This Visit's Progress    Increase water intake   On track    Recommend drinking at least 6-8 glasses of water a day     Patient Stated       More mobility         Depression Screen PHQ 2/9 Scores 05/24/2021 10/17/2020 04/17/2020 07/09/2019 04/12/2019 12/22/2018 03/09/2018  PHQ - 2 Score 0 0 0 0 0 0 0  PHQ- 9 Score - 4 2 0 - 2 3    Fall Risk Fall Risk  05/24/2021 04/17/2020 04/12/2019 11/11/2018 01/01/2018  Falls in the past year? 0 0 1 0 No  Number falls in past yr: 0 - 1 0 -  Injury with Fall? 0 - 1 - -  Risk for fall due to : (No Data) Impaired mobility;Impaired balance/gait;Medication side effect History of fall(s);Impaired mobility - -  Risk for fall due to: Comment power chair transferred - - - -  Follow up Falls evaluation completed;Falls prevention discussed Falls evaluation completed;Education provided;Falls prevention discussed - Falls evaluation completed -    FALL RISK PREVENTION PERTAINING TO THE HOME:  Any stairs in or around the home? Yes  If so, are there any without handrails? No  Home free of loose throw rugs in walkways, pet beds, electrical cords, etc? Yes  Adequate lighting in your home to reduce risk of falls? Yes   ASSISTIVE DEVICES UTILIZED TO PREVENT FALLS:  Life alert? Yes  Use of a cane,  walker or w/c? Yes  Grab bars in the bathroom? Yes  Shower chair or bench in shower? Yes  Elevated toilet seat or a handicapped toilet? Yes   TIMED UP AND GO:  Was the test performed? No .    Cognitive Function:  Normal cognitive status assessed by direct observation by this Nurse Health Advisor. No abnormalities found.       6CIT Screen 04/17/2020 01/01/2018 11/08/2016  What Year? 0 points 0 points 0 points  What month? 0 points 0 points 0 points  What time? 0 points 0 points 0 points  Count back from 20 0 points 0 points 0 points  Months in reverse 0 points 0 points 0 points  Repeat phrase 0 points 2 points 0 points  Total Score 0 2 0    Immunizations Immunization History  Administered Date(s) Administered   Influenza, High Dose Seasonal PF 02/21/2016, 02/12/2017   Influenza,inj,Quad PF,6+ Mos 02/22/2015   Influenza-Unspecified 02/16/2014   Pneumococcal Conjugate-13 08/31/2013   Td 08/31/2013   Zoster, Live 11/12/2011    TDAP status: Up to date  Flu Vaccine status: Declined, Education has been provided regarding the importance of this vaccine but patient still declined. Advised may receive this vaccine at local pharmacy or Health Dept. Aware to provide a copy of the vaccination record if obtained from local pharmacy or Health Dept. Verbalized acceptance and understanding.  Pneumococcal vaccine status: Due, Education has been provided regarding the importance of this vaccine. Advised may receive this vaccine at local pharmacy or Health Dept. Aware to provide a copy of the vaccination record if obtained from local pharmacy or Health Dept. Verbalized acceptance and understanding.  Covid-19 vaccine status: Declined, Education has been provided regarding the importance of this vaccine but patient still declined. Advised may receive this vaccine at local pharmacy or Health Dept.or vaccine clinic. Aware to provide a copy of the vaccination record if obtained from local pharmacy or  Health Dept. Verbalized acceptance and understanding.  Qualifies for Shingles Vaccine? Yes   Zostavax completed Yes   Shingrix Completed?: No.    Education has been provided regarding the importance of this vaccine. Patient has been advised to call insurance company to determine out of pocket expense if they have not yet received this vaccine. Advised may also receive vaccine at local pharmacy or Health Dept. Verbalized acceptance and understanding.  Screening Tests Health Maintenance  Topic Date Due   Pneumonia Vaccine 44+ Years old (2 - PPSV23 if available, else PCV20) 09/01/2014   COVID-19 Vaccine (1) 06/09/2021 (Originally 01/22/1941)   Zoster Vaccines- Shingrix (1 of 2) 06/28/2021 (Originally [birth year]1990)   TETANUS/TDAP  09/01/2023   DEXA SCAN  Completed   HPV VACCINES  Aged Out   INFLUENZA VACCINE  Discontinued    Health Maintenance  Health Maintenance Due  Topic Date Due   Pneumonia Vaccine 42+ Years old (2 - PPSV23 if available, else PCV20) 09/01/2014    Colorectal cancer screening: No longer required.   Mammogram status: No longer required due to  .  Bone Density status: Completed  . Results reflect: Bone density results: NORMAL. Repeat every 0 years.  Lung Cancer Screening: (Low Dose CT Chest recommended if Age 1-80 years, 30 pack-year currently smoking OR have quit w/in 15years.) does not qualify.   Lung Cancer Screening Referral:   Additional Screening:  Hepatitis C Screening: does not qualify; Completed   Vision Screening: Recommended annual ophthalmology exams for early detection of glaucoma and other disorders of the eye. Is the patient up to date  with their annual eye exam?  Yes  Who is the provider or what is the name of the office in which the patient attends annual eye exams? Dr. Brooke DareKing If pt is not established with a provider, would they like to be referred to a provider to establish care? No .   Dental Screening: Recommended annual dental exams for proper  oral hygiene  Community Resource Referral / Chronic Care Management: CRR required this visit?  No   CCM required this visit?  No      Plan:     I have personally reviewed and noted the following in the patients chart:   Medical and social history Use of alcohol, tobacco or illicit drugs  Current medications and supplements including opioid prescriptions.  Functional ability and status Nutritional status Physical activity Advanced directives List of other physicians Hospitalizations, surgeries, and ER visits in previous 12 months Vitals Screenings to include cognitive, depression, and falls Referrals and appointments  In addition, I have reviewed and discussed with patient certain preventive protocols, quality metrics, and best practice recommendations. A written personalized care plan for preventive services as well as general preventive health recommendations were provided to patient.     Remi HaggardJulie Renton Berkley, LPN   1/6/10961/09/2021   Nurse Notes:

## 2021-05-24 NOTE — Patient Instructions (Signed)
[name] , Thank you for taking time to come for your Medicare Wellness Visit. I appreciate your ongoing commitment to your health goals. Please review the following plan we discussed and let me know if I can assist you in the future.   Screening recommendations/referrals: Colonoscopy: no longer indicated Mammogram: no longer required Bone Density: up to date Recommended yearly ophthalmology/optometry visit for glaucoma screening and checkup Recommended yearly dental visit for hygiene and checkup  Vaccinations: Influenza vaccine: Education provided Pneumococcal vaccine: Education provided Tdap vaccine: up to date Shingles vaccine: Education provided    Advanced directives: Education provided  Conditions/risks identified:   Next appointment: 06-04-2021 @ 11:20 [place]   Preventive Care 65 Years and Older, Female Preventive care refers to lifestyle choices and visits with your health care provider that can promote health and wellness. What does preventive care include? A yearly physical exam. This is also called an annual well check. Dental exams once or twice a year. Routine eye exams. Ask your health care provider how often you should have your eyes checked. Personal lifestyle choices, including: Daily care of your teeth and gums. Regular physical activity. Eating a healthy diet. Avoiding tobacco and drug use. Limiting alcohol use. Practicing safe sex. Taking low-dose aspirin every day. Taking vitamin and mineral supplements as recommended by your health care provider. What happens during an annual well check? The services and screenings done by your health care provider during your annual well check will depend on your age, overall health, lifestyle risk factors, and family history of disease. Counseling  Your health care provider may ask you questions about your: Alcohol use. Tobacco use. Drug use. Emotional well-being. Home and relationship well-being. Sexual  activity. Eating habits. History of falls. Memory and ability to understand (cognition). Work and work Astronomer. Reproductive health. Screening  You may have the following tests or measurements: Height, weight, and BMI. Blood pressure. Lipid and cholesterol levels. These may be checked every 5 years, or more frequently if you are over 38 years old. Skin check. Lung cancer screening. You may have this screening every year starting at age 63 if you have a 30-pack-year history of smoking and currently smoke or have quit within the past 15 years. Fecal occult blood test (FOBT) of the stool. You may have this test every year starting at age 38. Flexible sigmoidoscopy or colonoscopy. You may have a sigmoidoscopy every 5 years or a colonoscopy every 10 years starting at age 29. Hepatitis C blood test. Hepatitis B blood test. Sexually transmitted disease (STD) testing. Diabetes screening. This is done by checking your blood sugar (glucose) after you have not eaten for a while (fasting). You may have this done every 1-3 years. Bone density scan. This is done to screen for osteoporosis. You may have this done starting at age 88. Mammogram. This may be done every 1-2 years. Talk to your health care provider about how often you should have regular mammograms. Talk with your health care provider about your test results, treatment options, and if necessary, the need for more tests. Vaccines  Your health care provider may recommend certain vaccines, such as: Influenza vaccine. This is recommended every year. Tetanus, diphtheria, and acellular pertussis (Tdap, Td) vaccine. You may need a Td booster every 10 years. Zoster vaccine. You may need this after age 65. Pneumococcal 13-valent conjugate (PCV13) vaccine. One dose is recommended after age 2. Pneumococcal polysaccharide (PPSV23) vaccine. One dose is recommended after age 12. Talk to your health care provider about which  screenings and vaccines  you need and how often you need them. This information is not intended to replace advice given to you by your health care provider. Make sure you discuss any questions you have with your health care provider. Document Released: 06/02/2015 Document Revised: 01/24/2016 Document Reviewed: 03/07/2015 Elsevier Interactive Patient Education  2017 [place] Prevention in the Home Falls can cause injuries. They can happen to people of all ages. There are many things you can do to make your home safe and to help prevent falls. What can I do on the outside of my home? Regularly fix the edges of walkways and driveways and fix any cracks. Remove anything that might make you trip as you walk through a door, such as a raised step or threshold. Trim any bushes or trees on the path to your home. Use bright outdoor lighting. Clear any walking paths of anything that might make someone trip, such as rocks or tools. Regularly check to see if handrails are loose or broken. Make sure that both sides of any steps have handrails. Any raised decks and porches should have guardrails on the edges. Have any leaves, snow, or ice cleared regularly. Use sand or salt on walking paths during winter. Clean up any spills in your garage right away. This includes oil or grease spills. What can I do in the bathroom? Use night lights. Install grab bars by the toilet and in the tub and shower. Do not use towel bars as grab bars. Use non-skid mats or decals in the tub or shower. If you need to sit down in the shower, use a plastic, non-slip stool. Keep the floor dry. Clean up any water that spills on the floor as soon as it happens. Remove soap buildup in the tub or shower regularly. Attach bath mats securely with double-sided non-slip rug tape. Do not have throw rugs and other things on the floor that can make you trip. What can I do in the bedroom? Use night lights. Make sure that you have a light by your bed that  is easy to reach. Do not use any sheets or blankets that are too big for your bed. They should not hang down onto the floor. Have a firm chair that has side arms. You can use this for support while you get dressed. Do not have throw rugs and other things on the floor that can make you trip. What can I do in the kitchen? Clean up any spills right away. Avoid walking on wet floors. Keep items that you use a lot in easy-to-reach places. If you need to reach something above you, use a strong step stool that has a grab bar. Keep electrical cords out of the way. Do not use floor polish or wax that makes floors slippery. If you must use wax, use non-skid floor wax. Do not have throw rugs and other things on the floor that can make you trip. What can I do with my stairs? Do not leave any items on the stairs. Make sure that there are handrails on both sides of the stairs and use them. Fix handrails that are broken or loose. Make sure that handrails are as long as the stairways. Check any carpeting to make sure that it is firmly attached to the stairs. Fix any carpet that is loose or worn. Avoid having throw rugs at the top or bottom of the stairs. If you do have throw rugs, attach them to the floor with carpet  tape. Make sure that you have a light switch at the top of the stairs and the bottom of the stairs. If you do not have them, ask someone to add them for you. What else can I do to help prevent falls? Wear shoes that: Do not have high heels. Have rubber bottoms. Are comfortable and fit you well. Are closed at the toe. Do not wear sandals. If you use a stepladder: Make sure that it is fully opened. Do not climb a closed stepladder. Make sure that both sides of the stepladder are locked into place. Ask someone to hold it for you, if possible. Clearly mark and make sure that you can see: Any grab bars or handrails. First and last steps. Where the edge of each step is. Use tools that help you  move around (mobility aids) if they are needed. These include: Canes. Walkers. Scooters. Crutches. Turn on the lights when you go into a dark area. Replace any light bulbs as soon as they burn out. Set up your furniture so you have a clear path. Avoid moving your furniture around. If any of your floors are uneven, fix them. If there are any pets around you, be aware of where they are. Review your medicines with your doctor. Some medicines can make you feel dizzy. This can increase your chance of falling. Ask your doctor what other things that you can do to help prevent falls. This information is not intended to replace advice given to you by your health care provider. Make sure you discuss any questions you have with your health care provider. Document Released: 03/02/2009 Document Revised: 10/12/2015 Document Reviewed: 06/10/2014 Elsevier Interactive Patient Education  2017 [employer].

## 2021-05-25 ENCOUNTER — Ambulatory Visit: Payer: Medicare PPO

## 2021-06-04 ENCOUNTER — Other Ambulatory Visit: Payer: Self-pay

## 2021-06-04 ENCOUNTER — Encounter: Payer: Self-pay | Admitting: Nurse Practitioner

## 2021-06-04 ENCOUNTER — Ambulatory Visit: Payer: Medicare PPO | Admitting: Nurse Practitioner

## 2021-06-04 DIAGNOSIS — J90 Pleural effusion, not elsewhere classified: Secondary | ICD-10-CM

## 2021-06-04 DIAGNOSIS — Z6841 Body Mass Index (BMI) 40.0 and over, adult: Secondary | ICD-10-CM | POA: Diagnosis not present

## 2021-06-04 DIAGNOSIS — Z8616 Personal history of COVID-19: Secondary | ICD-10-CM

## 2021-06-04 DIAGNOSIS — I517 Cardiomegaly: Secondary | ICD-10-CM | POA: Insufficient documentation

## 2021-06-04 MED ORDER — DOXYCYCLINE HYCLATE 100 MG PO TABS: 100.0000 mg | ORAL_TABLET | Freq: Two times a day (BID) | ORAL | 0 refills | Status: AC

## 2021-06-04 MED ORDER — HYDROCOD POLST-CPM POLST ER 10-8 MG/5ML PO SUER: 5.0000 mL | Freq: Two times a day (BID) | ORAL | 0 refills | Status: DC | PRN

## 2021-06-04 MED ORDER — BENZONATATE 100 MG PO CAPS: 100.0000 mg | ORAL_CAPSULE | Freq: Three times a day (TID) | ORAL | 0 refills | Status: DC | PRN

## 2021-06-04 NOTE — Assessment & Plan Note (Signed)
BMI 59.28 with underlying HTN and ASTHMA.  Recommended eating smaller high protein, low fat meals more frequently and exercising 30 mins a day 5 times a week with a goal of 10-15lb weight loss in the next 3 months. Patient voiced their understanding and motivation to adhere to these recommendations.  

## 2021-06-04 NOTE — Progress Notes (Signed)
BP 127/81    Pulse (!) 57    Temp 98 F (36.7 C) (Oral)    Wt (!) 340 lb (154.2 kg)    LMP  (LMP Unknown)    SpO2 98%    BMI 59.28 kg/m    Subjective:    Patient ID: [name], female    DOB: 05-Mar-1941, 81 y.o.   MRN: [id number]  HPI: [name] is a 81 y.o. female  Chief Complaint  Patient presents with   Covid Follow Up    Patient states since having COVID she has a lingering cough and states she has phlegm, but she cannot get it up. Patient states it has dry cough and she has about 8 perles left to take. Patient states she would like to discuss her recent chest x-[facility] results.    COVID POSITIVE Started with illness on 05/11/21 at night, nausea and body aches. Was treated on 05/15/21 with Molnupiravir.  Using nebulizer at home. Was benefiting from Tussionex and Tessalon at home.  Recent imaging done noted no pneumonia, mild atelectasis, and stable cardiomegaly.  She has ongoing fatigue, which was present before Covid as well. Fever: none Cough: yes -- feels like something is there, but can not get it up Shortness of breath: no Wheezing:  improved with nebulizer Chest pain: no Chest tightness: none Chest congestion: none Nasal congestion: none Runny nose: none Post nasal drip: no Sneezing: no Sore throat: none Swollen glands: no Sinus pressure: no Headache: none Face pain: no Toothache: no Ear pain: right ear Ear pressure: right ear Eyes red/itching:no Eye drainage/crusting: no  Vomiting: no Rash: no Fatigue: yes Sick contacts: yes -- everyone in house Strep contacts: no  Context: better Recurrent sinusitis: no Relief with OTC cold/cough medications: yes  Treatments attempted: Prednisone, Tessalon, nebulizer  Relevant past medical, surgical, family and social history reviewed and updated as indicated. Interim medical history since our last visit reviewed. Allergies and medications reviewed and updated.  Review of Systems  Constitutional:  Positive for  fatigue. Negative for activity change, appetite change, chills and fever.  HENT: Negative.    Eyes:  Negative for pain and visual disturbance.  Respiratory:  Positive for cough. Negative for chest tightness, shortness of breath and wheezing.   Cardiovascular:  Negative for chest pain, palpitations and leg swelling.  Gastrointestinal: Negative.   Neurological:  Negative for dizziness, numbness and headaches.  Psychiatric/Behavioral: Negative.     Per HPI unless specifically indicated above     Objective:    BP 127/81    Pulse (!) 57    Temp 98 F (36.7 C) (Oral)    Wt (!) 340 lb (154.2 kg)    LMP  (LMP Unknown)    SpO2 98%    BMI 59.28 kg/m   Wt Readings from Last 3 Encounters:  06/04/21 (!) 340 lb (154.2 kg)  11/29/20 (!) 340 lb (154.2 kg)  07/12/20 (!) 320 lb (145.2 kg)    Physical Exam Vitals and nursing note reviewed.  Constitutional:      General: She is awake. She is not in acute distress.    Appearance: She is well-developed. She is morbidly obese. She is not ill-appearing.  HENT:     Head: Normocephalic.     Right Ear: Hearing normal.     Left Ear: Hearing normal.     Nose: Nose normal.     Mouth/Throat:     Mouth: Mucous membranes are moist.  Eyes:  General: Lids are normal.        Right eye: No discharge.        Left eye: No discharge.     Conjunctiva/sclera: Conjunctivae normal.     Pupils: Pupils are equal, round, and reactive to light.  Neck:     Thyroid: No thyromegaly.     Vascular: No carotid bruit.  Cardiovascular:     Rate and Rhythm: Normal rate and regular rhythm.     Heart sounds: Normal heart sounds. No murmur heard.   No gallop.  Pulmonary:     Effort: Pulmonary effort is normal. No accessory muscle usage or respiratory distress.     Breath sounds: Normal breath sounds.  Abdominal:     General: Bowel sounds are normal.     Palpations: Abdomen is soft.  Musculoskeletal:     Cervical back: Normal range of motion and neck supple.     Right  lower leg: 3+ Edema present.     Left lower leg: 3+ Edema present.  Lymphadenopathy:     Cervical: No cervical adenopathy.  Skin:    General: Skin is warm and dry.  Neurological:     Mental Status: She is alert and oriented to person, place, and time.  Psychiatric:        Attention and Perception: Attention normal.        Mood and Affect: Mood normal.        Speech: Speech normal.        Behavior: Behavior normal. Behavior is cooperative.        Thought Content: Thought content normal.        Judgment: Judgment normal.    Results for orders placed or performed in visit on 10/17/20  CBC with Differential/Platelet  Result Value Ref Range   WBC 4.6 3.4 - 10.8 x10E3/uL   RBC 4.83 3.77 - 5.28 x10E6/uL   Hemoglobin 14.6 11.1 - 15.9 g/dL   Hematocrit 43.7 34.0 - 46.6 %   MCV 91 79 - 97 fL   MCH 30.2 26.6 - 33.0 pg   MCHC 33.4 31.5 - 35.7 g/dL   RDW 12.7 11.7 - 15.4 %   Platelets 258 150 - 450 x10E3/uL   Neutrophils 54 Not Estab. %   Lymphs 35 Not Estab. %   Monocytes 10 Not Estab. %   Eos 1 Not Estab. %   Basos 0 Not Estab. %   Neutrophils Absolute 2.5 1.4 - 7.0 x10E3/uL   Lymphocytes Absolute 1.6 0.7 - 3.1 x10E3/uL   Monocytes Absolute 0.4 0.1 - 0.9 x10E3/uL   EOS (ABSOLUTE) 0.0 0.0 - 0.4 x10E3/uL   Basophils Absolute 0.0 0.0 - 0.2 x10E3/uL   Immature Granulocytes 0 Not Estab. %   Immature Grans (Abs) 0.0 0.0 - 0.1 x10E3/uL  Comprehensive metabolic panel  Result Value Ref Range   Glucose 113 (H) 65 - 99 mg/dL   BUN 17 8 - 27 mg/dL   Creatinine, Ser 0.87 0.57 - 1.00 mg/dL   eGFR 67 >59 mL/min/1.73   BUN/Creatinine Ratio 20 12 - 28   Sodium 140 134 - 144 mmol/L   Potassium 4.6 3.5 - 5.2 mmol/L   Chloride 107 (H) 96 - 106 mmol/L   CO2 18 (L) 20 - 29 mmol/L   Calcium 10.3 8.7 - 10.3 mg/dL   Total Protein 6.7 6.0 - 8.5 g/dL   Albumin 4.3 3.7 - 4.7 g/dL   Globulin, Total 2.4 1.5 - 4.5 g/dL   Albumin/Globulin Ratio 1.8 1.2 -  2.2   Bilirubin Total 0.2 0.0 - 1.2 mg/dL    Alkaline Phosphatase 92 44 - 121 IU/L   AST 17 0 - 40 IU/L   ALT 14 0 - 32 IU/L  Lipid Panel w/o Chol/HDL Ratio  Result Value Ref Range   Cholesterol, Total 204 (H) 100 - 199 mg/dL   Triglycerides 121 0 - 149 mg/dL   HDL 93 >39 mg/dL   VLDL Cholesterol Cal 21 5 - 40 mg/dL   LDL Chol Calc (NIH) 90 0 - 99 mg/dL  TSH  Result Value Ref Range   TSH 3.440 0.450 - 4.500 uIU/mL  VITAMIN D 25 Hydroxy (Vit-D Deficiency, Fractures)  Result Value Ref Range   Vit D, 25-Hydroxy 56.4 30.0 - 100.0 ng/mL  Vitamin B12  Result Value Ref Range   Vitamin B-12 345 232 - 1,245 pg/mL  Magnesium  Result Value Ref Range   Magnesium 1.7 1.6 - 2.3 mg/dL      Assessment & Plan:   Problem List Items Addressed This Visit       Cardiovascular and Mediastinum   Cardiomegaly    Remains stable on imaging, will obtain baseline echo to further assess.      Relevant Orders   ECHOCARDIOGRAM COMPLETE     Respiratory   RESOLVED: Pleural effusion    Cleared on recent imaging.        Other   BMI 50.0-59.9, adult ([place])    Recommend continued focus on healthy diet choices and regular physical activity (30 minutes 5 days a week).  Not weighed today, in electric W/C.      History of 2019 novel coronavirus disease (COVID-19)    Acute and improving at this time, diagnosed and treated with [facility] 05/15/21.  Recent imaging with no PNA noted -- recommend she perform deep breathing exercises daily due to atelectasis noted and to prevent PNA.  Will obtain echo due to cardiomegaly continuing on imaging, although stable would benefit baseline echo due to ongoing fatigue.  Refills on Tessalon and Tussionex sent.  Doxycycline sent for lingering cough symptoms and underlying asthma.  Return in 4 weeks.  Will get labs then.      Morbid obesity ([place]) - Primary    BMI 59.28 with underlying HTN and ASTHMA.  Recommended eating smaller high protein, low fat meals more frequently and exercising 30 mins a day 5 times a week  with a goal of 10-15lb weight loss in the next 3 months. Patient voiced their understanding and motivation to adhere to these recommendations.         Follow up plan: Return in about 4 weeks (around 07/02/2021) for Covid follow-up and Fatigue, HTN/HLD, GERD, MOOD -- labs to be obtained.

## 2021-06-04 NOTE — Patient Instructions (Signed)

## 2021-06-04 NOTE — Assessment & Plan Note (Signed)
Remains stable on imaging, will obtain baseline echo to further assess.

## 2021-06-04 NOTE — Assessment & Plan Note (Signed)
Recommend continued focus on healthy diet choices and regular physical activity (30 minutes 5 days a week).  Not weighed today, in electric W/C. 

## 2021-06-04 NOTE — Assessment & Plan Note (Signed)
Acute and improving at this time, diagnosed and treated with [facility] 05/15/21.  Recent imaging with no PNA noted -- recommend she perform deep breathing exercises daily due to atelectasis noted and to prevent PNA.  Will obtain echo due to cardiomegaly continuing on imaging, although stable would benefit baseline echo due to ongoing fatigue.  Refills on Tessalon and Tussionex sent.  Doxycycline sent for lingering cough symptoms and underlying asthma.  Return in 4 weeks.  Will get labs then.

## 2021-06-04 NOTE — Assessment & Plan Note (Signed)
Cleared on recent imaging.

## 2021-06-05 MED ORDER — HYDROCOD POLST-CPM POLST ER 10-8 MG/5ML PO SUER: 5.0000 mL | Freq: Two times a day (BID) | ORAL | 0 refills | Status: DC | PRN

## 2021-06-21 ENCOUNTER — Other Ambulatory Visit: Payer: Medicare PPO

## 2021-07-02 ENCOUNTER — Ambulatory Visit: Payer: Medicare PPO | Admitting: Nurse Practitioner

## 2021-07-07 NOTE — Patient Instructions (Signed)
Asthma, Adult ?Asthma is a long-term (chronic) condition in which the airways get tight and narrow. The airways are the breathing passages that lead from the nose and mouth down into the lungs. A person with asthma will have times when symptoms get worse. These are called asthma attacks. They can cause coughing, whistling sounds when you breathe (wheezing), shortness of breath, and chest pain. They can make it hard to breathe. There is no cure for asthma, but medicines and lifestyle changes can help control it. ?There are many things that can bring on an asthma attack or make asthma symptoms worse (triggers). Common triggers include: ?Mold. ?Dust. ?Cigarette smoke. ?Cockroaches. ?Things that can cause allergy symptoms (allergens). These include animal skin flakes (dander) and pollen from trees or grass. ?Things that pollute the air. These may include household cleaners, wood smoke, smog, or chemical odors. ?Cold air, weather changes, and wind. ?Crying or laughing hard. ?Stress. ?Certain medicines or drugs. ?Certain foods such as dried fruit, potato chips, and grape juice. ?Infections, such as a cold or the flu. ?Certain medical conditions or diseases. ?Exercise or tiring activities. ?Asthma may be treated with medicines and by staying away from the things that cause asthma attacks. Types of medicines may include: ?Controller medicines. These help prevent asthma symptoms. They are usually taken every day. ?Fast-acting reliever or rescue medicines. These quickly relieve asthma symptoms. They are used as needed and provide short-term relief. ?Allergy medicines if your attacks are brought on by allergens. ?Medicines to help control the body's defense (immune) system. ?Follow these instructions at home: ?Avoiding triggers in your home ?Change your heating and air conditioning filter often. ?Limit your use of fireplaces and wood stoves. ?Get rid of pests (such as roaches and mice) and their droppings. ?Throw away plants  if you see mold on them. ?Clean your floors. Dust regularly. Use cleaning products that do not smell. ?Have someone vacuum when you are not home. Use a vacuum cleaner with a HEPA filter if possible. ?Replace carpet with wood, tile, or vinyl flooring. Carpet can trap animal skin flakes and dust. ?Use allergy-proof pillows, mattress covers, and box spring covers. ?Wash bed sheets and blankets every week in hot water. Dry them in a dryer. ?Keep your bedroom free of any triggers. ?Avoid pets and keep windows closed when things that cause allergy symptoms are in the air. ?Use blankets that are made of polyester or cotton. ?Clean bathrooms and kitchens with bleach. If possible, have someone repaint the walls in these rooms with mold-resistant paint. Keep out of the rooms that are being cleaned and painted. ?Wash your hands often with soap and water. If soap and water are not available, use hand sanitizer. ?Do not allow anyone to smoke in your home. ?General instructions ?Take over-the-counter and prescription medicines only as told by your doctor. ?Talk with your doctor if you have questions about how or when to take your medicines. ?Make note if you need to use your medicines more often than usual. ?Do not use any products that contain nicotine or tobacco, such as cigarettes and e-cigarettes. If you need help quitting, ask your doctor. ?Stay away from secondhand smoke. ?Avoid doing things outdoors when allergen counts are high and when air quality is low. ?Wear a ski mask when doing outdoor activities in the winter. The mask should cover your nose and mouth. Exercise indoors on cold days if you can. ?Warm up before you exercise. Take time to cool down after exercise. ?Use a peak flow meter as   told by your doctor. A peak flow meter is a tool that measures how well the lungs are working. ?Keep track of the peak flow meter's readings. Write them down. ?Follow your asthma action plan. This is a written plan for taking care  of your asthma and treating your attacks. ?Make sure you get all the shots (vaccines) that your doctor recommends. Ask your doctor about a flu shot and a pneumonia shot. ?Keep all follow-up visits as told by your doctor. This is important. ?Contact a doctor if: ?You have wheezing, shortness of breath, or a cough even while taking medicine to prevent attacks. ?The mucus you cough up (sputum) is thicker than usual. ?The mucus you cough up changes from clear or white to yellow, green, gray, or bloody. ?You have problems from the medicine you are taking, such as: ?A rash. ?Itching. ?Swelling. ?Trouble breathing. ?You need reliever medicines more than 2-3 times a week. ?Your peak flow reading is still at 50-79% of your personal best after following the action plan for 1 hour. ?You have a fever. ?Get help right away if: ?You seem to be worse and are not responding to medicine during an asthma attack. ?You are short of breath even at rest. ?You get short of breath when doing very little activity. ?You have trouble eating, drinking, or talking. ?You have chest pain or tightness. ?You have a fast heartbeat. ?Your lips or fingernails start to turn blue. ?You are light-headed or dizzy, or you faint. ?Your peak flow is less than 50% of your personal best. ?You feel too tired to breathe normally. ?Summary ?Asthma is a long-term (chronic) condition in which the airways get tight and narrow. An asthma attack can make it hard to breathe. ?Asthma cannot be cured, but medicines and lifestyle changes can help control it. ?Make sure you understand how to avoid triggers and how and when to use your medicines. ?This information is not intended to replace advice given to you by your health care provider. Make sure you discuss any questions you have with your health care provider. ?Document Revised: 08/29/2019 Document Reviewed: 09/08/2019 ?Elsevier Patient Education ? 2022 [employer] ? ?

## 2021-07-09 ENCOUNTER — Ambulatory Visit: Payer: Medicare PPO | Admitting: Nurse Practitioner

## 2021-07-09 ENCOUNTER — Encounter: Payer: Self-pay | Admitting: Nurse Practitioner

## 2021-07-09 ENCOUNTER — Other Ambulatory Visit: Payer: Self-pay

## 2021-07-09 VITALS — BP 114/68 | HR 67 | Temp 98.2°F

## 2021-07-09 DIAGNOSIS — E538 Deficiency of other specified B group vitamins: Secondary | ICD-10-CM | POA: Diagnosis not present

## 2021-07-09 DIAGNOSIS — E782 Mixed hyperlipidemia: Secondary | ICD-10-CM

## 2021-07-09 DIAGNOSIS — I872 Venous insufficiency (chronic) (peripheral): Secondary | ICD-10-CM | POA: Diagnosis not present

## 2021-07-09 DIAGNOSIS — I700 Atherosclerosis of aorta: Secondary | ICD-10-CM

## 2021-07-09 DIAGNOSIS — E041 Nontoxic single thyroid nodule: Secondary | ICD-10-CM

## 2021-07-09 DIAGNOSIS — E559 Vitamin D deficiency, unspecified: Secondary | ICD-10-CM | POA: Diagnosis not present

## 2021-07-09 DIAGNOSIS — J4540 Moderate persistent asthma, uncomplicated: Secondary | ICD-10-CM | POA: Diagnosis not present

## 2021-07-09 DIAGNOSIS — K219 Gastro-esophageal reflux disease without esophagitis: Secondary | ICD-10-CM

## 2021-07-09 DIAGNOSIS — I517 Cardiomegaly: Secondary | ICD-10-CM | POA: Diagnosis not present

## 2021-07-09 DIAGNOSIS — Z6841 Body Mass Index (BMI) 40.0 and over, adult: Secondary | ICD-10-CM | POA: Diagnosis not present

## 2021-07-09 DIAGNOSIS — F330 Major depressive disorder, recurrent, mild: Secondary | ICD-10-CM

## 2021-07-09 DIAGNOSIS — I890 Lymphedema, not elsewhere classified: Secondary | ICD-10-CM | POA: Diagnosis not present

## 2021-07-09 DIAGNOSIS — I10 Essential (primary) hypertension: Secondary | ICD-10-CM | POA: Diagnosis not present

## 2021-07-09 DIAGNOSIS — Z8616 Personal history of COVID-19: Secondary | ICD-10-CM

## 2021-07-09 DIAGNOSIS — G4733 Obstructive sleep apnea (adult) (pediatric): Secondary | ICD-10-CM

## 2021-07-09 MED ORDER — BENZONATATE 100 MG PO CAPS: 100.0000 mg | ORAL_CAPSULE | Freq: Three times a day (TID) | ORAL | 4 refills | Status: DC | PRN

## 2021-07-09 NOTE — Assessment & Plan Note (Signed)
Chronic, stable.  Continue current medication regimen and adjust as needed based on mood.  Denies SI/HI.  Refills sent as needed.

## 2021-07-09 NOTE — Assessment & Plan Note (Signed)
BMI 59.28 with underlying HTN and ASTHMA.  Recommended eating smaller high protein, low fat meals more frequently and exercising 30 mins a day 5 times a week with a goal of 10-15lb weight loss in the next 3 months. Patient voiced their understanding and motivation to adhere to these recommendations.  

## 2021-07-09 NOTE — Assessment & Plan Note (Signed)
Followed by Dr. McQueen.  Due to continued c/o fatigue will obtain TSH today. 

## 2021-07-09 NOTE — Assessment & Plan Note (Signed)
Recommend continued focus on healthy diet choices and regular physical activity (30 minutes 5 days a week).  Not weighed today, in electric W/C. 

## 2021-07-09 NOTE — Assessment & Plan Note (Signed)
Overall improving at this time, will send in Tessalon refills for lingering cough.

## 2021-07-09 NOTE — Assessment & Plan Note (Signed)
Continue 100% use of CPAP at night. 

## 2021-07-09 NOTE — Assessment & Plan Note (Signed)
Chronic, stable with good control.  Continue current medication regimen and adjust as needed.  Check mag level yearly.   

## 2021-07-09 NOTE — Assessment & Plan Note (Signed)
Chronic, ongoing.  Recommend use of Tylenol as needed for pain, max 3000 MG total daily dosing.  Avoid Ibuprofen or NSAID products.  Continue using compression wraps daily and massage therapy.  Will have her return to vascular as needed. 

## 2021-07-09 NOTE — Progress Notes (Signed)
BP 114/68    Pulse 67    Temp 98.2 F (36.8 C) (Oral)    LMP  (LMP Unknown)    SpO2 97%    Subjective:    Patient ID: [name], female    DOB: 03/11/1941, 81 y.o.   MRN: [id number]  HPI: [name] is a 81 y.o. female  Chief Complaint  Patient presents with   COVID Follow Up   Fatigue    Patient states she is still experiencing the low energy.    Hyperlipidemia   Hypertension   Gastroesophageal Reflux   Medication Refill    Patient requesting a refill on the Tessalon [place] as she is still having lingering side effects.    COVID POSITIVE Covid initially dx 05/11/21 at night. Was treated on 05/15/21 with Molnupiravir.  Was benefiting from Tussionex and Tessalon at home.  Recent imaging done noted no pneumonia, mild atelectasis, and stable cardiomegaly -- ordered echo on 06/04/21 and she is obtaining next week.  She has ongoing fatigue, which was present before Covid as well.  Cough is somewhat improving, less productive.  Was treated on 06/04/21 with Doxycycline for cough lingering. Fever: none Cough: yes -- improving Shortness of breath: no Wheezing:  improved with nebulizer Chest pain: no Chest tightness: none Chest congestion: none Nasal congestion: none Runny nose: none Post nasal drip: no Sneezing: no Sore throat: none Swollen glands: no Sinus pressure: no Headache: none Face pain: no Toothache: no Ear pain: none Ear pressure: none Eyes red/itching:no Eye drainage/crusting: no  Vomiting: no Rash: no Fatigue: yes Sick contacts: yes -- everyone in house Strep contacts: no  Context: better Recurrent sinusitis: no Relief with OTC cold/cough medications: yes  Treatments attempted: Prednisone, Tessalon, nebulizer  HYPERTENSION / HYPERLIPIDEMIA Continues on Furosemide, ASA, Avapro, Bystolic, and Atorvastatin.  Sees [name] again in July, last visit was 11/29/20.   Has chronic lymphedema and was going for massages -- with Covid her massage therapist shut  down.  Taking Tylenol twice a day.  Uses compression pumps at home.  Has seen vascular in past, last 06/07/19. Satisfied with current treatment? yes Duration of hypertension: chronic BP monitoring frequency: daily BP range: 120-130/80 BP medication side effects: no Duration of hyperlipidemia: chronic Cholesterol medication side effects: no Cholesterol supplements: none Medication compliance: good compliance Aspirin: no Recent stressors: no Recurrent headaches: no Visual changes: no Palpitations: no Dyspnea: occasional at baseline Chest pain: no Lower extremity edema: baseline Dizzy/lightheaded: no    COPD Continues Wixela & Spiriva and Albuterol as needed.  Uses CPAP 100% at home.  Last saw Dr. Mortimer Fries with pulmonary - 10/15/2018, has not returned since then. COPD status: stable Satisfied with current treatment?: yes Oxygen use: no Dyspnea frequency: occasional Cough frequency: stable Rescue inhaler frequency: occasional Limitation of activity: no Productive cough: none Last Spirometry: unknown Pneumovax: Up to Date Influenza: Up to Date   DEPRESSION Continues on Wellbutrin and Trazodone 50 MG at bedtime as needed.   Mood status: stable Satisfied with current treatment?: yes Symptom severity: mild  Duration of current treatment : chronic Side effects: no Medication compliance: good compliance Psychotherapy/counseling: none Depressed mood: no Anxious mood: no Anhedonia: no Significant weight loss or gain: no Insomnia: yes hard to fall asleep Fatigue: no Feelings of worthlessness or guilt: no Impaired concentration/indecisiveness: no Suicidal ideations: no Hopelessness: no Crying spells: no Depression screen [facility] 2/9 06/04/2021 05/24/2021 10/17/2020 04/17/2020 07/09/2019  Decreased Interest 0 0 0 0 0  Down, Depressed, Hopeless 0  0 0 0 0  PHQ - 2 Score 0 0 0 0 0  Altered sleeping 0 - 1 0 0  Tired, decreased energy 2 - 3 1 0  Change in appetite 0 - 0 0 0  Feeling bad or  failure about yourself  0 - 0 1 0  Trouble concentrating 0 - 0 0 0  Moving slowly or fidgety/restless 0 - 0 0 0  Suicidal thoughts 0 - 0 0 0  PHQ-9 Score 2 - 4 2 0  Difficult doing work/chores Somewhat difficult - Not difficult at all Not difficult at all Not difficult at all  Some recent data might be hidden    GAD 7 : Generalized Anxiety Score 07/09/2021 10/17/2020 03/09/2018 03/09/2018  Nervous, Anxious, on Edge 0 1 0 0  Control/stop worrying 0 _0 Worry too much - different things 0 _1 Trouble relaxing 0 0 0 0  Restless 0 0 0 0  Easily annoyed or irritable 0 0 0 0  Afraid - awful might happen 0 0 0 0  Total GAD 7 Score 0 _2 Anxiety Difficulty Not difficult at all Not difficult at all - -   Relevant past medical, surgical, family and social history reviewed and updated as indicated. Interim medical history since our last visit reviewed. Allergies and medications reviewed and updated.  Review of Systems  Constitutional:  Positive for fatigue. Negative for activity change, appetite change, chills and fever.  HENT: Negative.    Eyes:  Negative for pain and visual disturbance.  Respiratory:  Positive for cough. Negative for chest tightness, shortness of breath and wheezing.   Cardiovascular:  Negative for chest pain, palpitations and leg swelling.  Gastrointestinal: Negative.   Neurological:  Negative for dizziness, numbness and headaches.  Psychiatric/Behavioral: Negative.     Per HPI unless specifically indicated above     Objective:    BP 114/68    Pulse 67    Temp 98.2 F (36.8 C) (Oral)    LMP  (LMP Unknown)    SpO2 97%   Wt Readings from Last 3 Encounters:  06/04/21 (!) 340 lb (154.2 kg)  11/29/20 (!) 340 lb (154.2 kg)  07/12/20 (!) 320 lb (145.2 kg)    Physical Exam Vitals and nursing note reviewed.  Constitutional:      General: She is awake. She is not in acute distress.    Appearance: She is well-developed. She is morbidly obese. She is not  ill-appearing.  HENT:     Head: Normocephalic.     Right Ear: Hearing normal.     Left Ear: Hearing normal.     Nose: Nose normal.     Mouth/Throat:     Mouth: Mucous membranes are moist.  Eyes:     General: Lids are normal.        Right eye: No discharge.        Left eye: No discharge.     Conjunctiva/sclera: Conjunctivae normal.     Pupils: Pupils are equal, round, and reactive to light.  Neck:     Thyroid: No thyromegaly.     Vascular: No carotid bruit.  Cardiovascular:     Rate and Rhythm: Normal rate and regular rhythm.     Heart sounds: Normal heart sounds. No murmur heard.   No gallop.  Pulmonary:     Effort: Pulmonary effort is normal. No accessory muscle usage or respiratory distress.     Breath sounds: Normal  breath sounds.  Abdominal:     General: Bowel sounds are normal.     Palpations: Abdomen is soft.  Musculoskeletal:     Cervical back: Normal range of motion and neck supple.     Right lower leg: 3+ Edema present.     Left lower leg: 3+ Edema present.  Lymphadenopathy:     Cervical: No cervical adenopathy.  Skin:    General: Skin is warm and dry.  Neurological:     Mental Status: She is alert and oriented to person, place, and time.  Psychiatric:        Attention and Perception: Attention normal.        Mood and Affect: Mood normal.        Speech: Speech normal.        Behavior: Behavior normal. Behavior is cooperative.        Thought Content: Thought content normal.        Judgment: Judgment normal.   Results for orders placed or performed in visit on 10/17/20  CBC with Differential/Platelet  Result Value Ref Range   WBC 4.6 3.4 - 10.8 x10E3/uL   RBC 4.83 3.77 - 5.28 x10E6/uL   Hemoglobin 14.6 11.1 - 15.9 g/dL   Hematocrit 43.7 34.0 - 46.6 %   MCV 91 79 - 97 fL   MCH 30.2 26.6 - 33.0 pg   MCHC 33.4 31.5 - 35.7 g/dL   RDW 12.7 11.7 - 15.4 %   Platelets 258 150 - 450 x10E3/uL   Neutrophils 54 Not Estab. %   Lymphs 35 Not Estab. %   Monocytes 10  Not Estab. %   Eos 1 Not Estab. %   Basos 0 Not Estab. %   Neutrophils Absolute 2.5 1.4 - 7.0 x10E3/uL   Lymphocytes Absolute 1.6 0.7 - 3.1 x10E3/uL   Monocytes Absolute 0.4 0.1 - 0.9 x10E3/uL   EOS (ABSOLUTE) 0.0 0.0 - 0.4 x10E3/uL   Basophils Absolute 0.0 0.0 - 0.2 x10E3/uL   Immature Granulocytes 0 Not Estab. %   Immature Grans (Abs) 0.0 0.0 - 0.1 x10E3/uL  Comprehensive metabolic panel  Result Value Ref Range   Glucose 113 (H) 65 - 99 mg/dL   BUN 17 8 - 27 mg/dL   Creatinine, Ser 0.87 0.57 - 1.00 mg/dL   eGFR 67 >59 mL/min/1.73   BUN/Creatinine Ratio 20 12 - 28   Sodium 140 134 - 144 mmol/L   Potassium 4.6 3.5 - 5.2 mmol/L   Chloride 107 (H) 96 - 106 mmol/L   CO2 18 (L) 20 - 29 mmol/L   Calcium 10.3 8.7 - 10.3 mg/dL   Total Protein 6.7 6.0 - 8.5 g/dL   Albumin 4.3 3.7 - 4.7 g/dL   Globulin, Total 2.4 1.5 - 4.5 g/dL   Albumin/Globulin Ratio 1.8 1.2 - 2.2   Bilirubin Total 0.2 0.0 - 1.2 mg/dL   Alkaline Phosphatase 92 44 - 121 IU/L   AST 17 0 - 40 IU/L   ALT 14 0 - 32 IU/L  Lipid Panel w/o Chol/HDL Ratio  Result Value Ref Range   Cholesterol, Total 204 (H) 100 - 199 mg/dL   Triglycerides 121 0 - 149 mg/dL   HDL 93 >39 mg/dL   VLDL Cholesterol Cal 21 5 - 40 mg/dL   LDL Chol Calc (NIH) 90 0 - 99 mg/dL  TSH  Result Value Ref Range   TSH 3.440 0.450 - 4.500 uIU/mL  VITAMIN D 25 Hydroxy (Vit-D Deficiency, Fractures)  Result Value Ref  Range   Vit D, 25-Hydroxy 56.4 30.0 - 100.0 ng/mL  Vitamin B12  Result Value Ref Range   Vitamin B-12 345 232 - 1,245 pg/mL  Magnesium  Result Value Ref Range   Magnesium 1.7 1.6 - 2.3 mg/dL      Assessment & Plan:   Problem List Items Addressed This Visit       Cardiovascular and Mediastinum   Aortic atherosclerosis ([place])    Noted on CT scan 09/10/2017.  Recommend continue ASA and statin daily for prevention.      Relevant Orders   Comprehensive metabolic panel   Lipid Panel w/o Chol/HDL Ratio   Cardiomegaly    Scheduled for  echo next week for further assessment. ?possible cause for ongoing fatigue.  Labs today, return in 6 weeks.      Chronic venous insufficiency    Chronic, ongoing.  Recommend use of Tylenol as needed for pain, max 3000 MG total daily dosing.  Continue using compression wraps daily and massage therapy.  Will have her return to vascular as needed.      Relevant Orders   Comprehensive metabolic panel   CBC with Differential/Platelet   Lipid Panel w/o Chol/HDL Ratio   Essential hypertension, benign    Chronic, ongoing with BP at goal today. Continue current medication regimen and adjust as needed + collaboration with cardiology.  Recommend she monitor BP at least a few mornings a week at home and document.  DASH diet at home.  Labs today: CBC, CMP, TSH, urine ALB.  Refills as needed.       Relevant Orders   Microalbumin, Urine Waived   Comprehensive metabolic panel     Respiratory   Asthma    Chronic, stable.  Continue current medication regimen and adjust as needed.  May benefit from return to pulmonary in upcoming months, will further discuss next visit.      Relevant Orders   CBC with Differential/Platelet   OSA (obstructive sleep apnea)    Continue 100% use of CPAP at night.        Digestive   GERD without esophagitis    Chronic, stable with good control.  Continue current medication regimen and adjust as needed.  Check mag level yearly.        Relevant Orders   Magnesium     Endocrine   Thyroid nodule    Followed by Dr. Tami Ribas.  Due to continued c/o fatigue will obtain TSH today.      Relevant Orders   TSH   T4, free     Other   BMI 50.0-59.9, adult (HCC)    Recommend continued focus on healthy diet choices and regular physical activity (30 minutes 5 days a week).  Not weighed today, in electric W/C.      Depression - Primary    Chronic, stable.  Continue current medication regimen and adjust as needed based on mood.  Denies SI/HI.  Refills sent as needed.       History of 2019 novel coronavirus disease (COVID-19)    Overall improving at this time, will send in Tessalon refills for lingering cough.        Hyperlipidemia    Chronic, ongoing.  Continue current medication regimen and adjust as needed.  Lipid panel today.      Relevant Orders   Comprehensive metabolic panel   Lipid Panel w/o Chol/HDL Ratio   Lymphedema    Chronic, ongoing.  Recommend use of Tylenol as needed for pain, max 3000  MG total daily dosing.  Avoid Ibuprofen or NSAID products.  Continue using compression wraps daily and massage therapy.  Will have her return to vascular as needed.      Morbid obesity (HCC)    BMI 59.28 with underlying HTN and ASTHMA.  Recommended eating smaller high protein, low fat meals more frequently and exercising 30 mins a day 5 times a week with a goal of 10-15lb weight loss in the next 3 months. Patient voiced their understanding and motivation to adhere to these recommendations.       Vitamin B12 deficiency    Ongoing, continue daily supplement and recheck B12 level today.      Relevant Orders   CBC with Differential/Platelet   Vitamin B12   Vitamin D deficiency    Ongoing and stable.  Continue daily supplement and recheck level today.      Relevant Orders   VITAMIN D 25 Hydroxy (Vit-D Deficiency, Fractures)     Follow up plan: Return in about 6 weeks (around 08/20/2021) for Fatigue -- virtual.

## 2021-07-09 NOTE — Assessment & Plan Note (Signed)
Chronic, ongoing.  Continue current medication regimen and adjust as needed. Lipid panel today. 

## 2021-07-09 NOTE — Assessment & Plan Note (Signed)
Chronic, ongoing with BP at goal today. Continue current medication regimen and adjust as needed + collaboration with cardiology.  Recommend she monitor BP at least a few mornings a week at home and document.  DASH diet at home.  Labs today: CBC, CMP, TSH, urine ALB.  Refills as needed.

## 2021-07-09 NOTE — Assessment & Plan Note (Signed)
Ongoing, continue daily supplement and recheck B12 level today. 

## 2021-07-09 NOTE — Assessment & Plan Note (Signed)
Scheduled for echo next week for further assessment. ?possible cause for ongoing fatigue.  Labs today, return in 6 weeks.

## 2021-07-09 NOTE — Assessment & Plan Note (Signed)
Chronic, ongoing.  Recommend use of Tylenol as needed for pain, max 3000 MG total daily dosing.  Continue using compression wraps daily and massage therapy.  Will have her return to vascular as needed. 

## 2021-07-09 NOTE — Assessment & Plan Note (Signed)
Chronic, stable.  Continue current medication regimen and adjust as needed.  May benefit from return to pulmonary in upcoming months, will further discuss next visit. 

## 2021-07-09 NOTE — Assessment & Plan Note (Signed)
Noted on CT scan 09/10/2017.  Recommend continue ASA and statin daily for prevention. 

## 2021-07-09 NOTE — Assessment & Plan Note (Signed)
Ongoing and stable.  Continue daily supplement and recheck level today. 

## 2021-07-10 LAB — COMPREHENSIVE METABOLIC PANEL
ALT: 13 IU/L (ref 0–32)
AST: 18 IU/L (ref 0–40)
Albumin/Globulin Ratio: 2 (ref 1.2–2.2)
Albumin: 4.4 g/dL (ref 3.7–4.7)
Alkaline Phosphatase: 95 IU/L (ref 44–121)
BUN/Creatinine Ratio: 20 (ref 12–28)
BUN: 17 mg/dL (ref 8–27)
Bilirubin Total: 0.5 mg/dL (ref 0.0–1.2)
CO2: 21 mmol/L (ref 20–29)
Calcium: 10.8 mg/dL — ABNORMAL HIGH (ref 8.7–10.3)
Chloride: 104 mmol/L (ref 96–106)
Creatinine, Ser: 0.87 mg/dL (ref 0.57–1.00)
Globulin, Total: 2.2 g/dL (ref 1.5–4.5)
Glucose: 74 mg/dL (ref 70–99)
Potassium: 4.5 mmol/L (ref 3.5–5.2)
Sodium: 140 mmol/L (ref 134–144)
Total Protein: 6.6 g/dL (ref 6.0–8.5)
eGFR: 67 mL/min/{1.73_m2} (ref >59)

## 2021-07-10 LAB — CBC WITH DIFFERENTIAL/PLATELET
Basophils Absolute: 0 10*3/uL (ref 0.0–0.2)
Basos: 0 %
EOS (ABSOLUTE): 0 10*3/uL (ref 0.0–0.4)
Eos: 1 %
Hematocrit: 44.7 % (ref 34.0–46.6)
Hemoglobin: 14.6 g/dL (ref 11.1–15.9)
Immature Grans (Abs): 0 10*3/uL (ref 0.0–0.1)
Immature Granulocytes: 0 %
Lymphocytes Absolute: 1.7 10*3/uL (ref 0.7–3.1)
Lymphs: 39 %
MCH: 30 pg (ref 26.6–33.0)
MCHC: 32.7 g/dL (ref 31.5–35.7)
MCV: 92 fL (ref 79–97)
Monocytes Absolute: 0.4 10*3/uL (ref 0.1–0.9)
Monocytes: 10 %
Neutrophils Absolute: 2.2 10*3/uL (ref 1.4–7.0)
Neutrophils: 50 %
Platelets: 273 10*3/uL (ref 150–450)
RBC: 4.87 x10E6/uL (ref 3.77–5.28)
RDW: 13.3 % (ref 11.7–15.4)
WBC: 4.3 10*3/uL (ref 3.4–10.8)

## 2021-07-10 LAB — LIPID PANEL W/O CHOL/HDL RATIO
Cholesterol, Total: 204 mg/dL — ABNORMAL HIGH (ref 100–199)
HDL: 84 mg/dL
LDL Chol Calc (NIH): 99 mg/dL (ref 0–99)
Triglycerides: 123 mg/dL (ref 0–149)
VLDL Cholesterol Cal: 21 mg/dL (ref 5–40)

## 2021-07-10 LAB — T4, FREE: Free T4: 1.5 ng/dL (ref 0.82–1.77)

## 2021-07-10 LAB — VITAMIN B12: Vitamin B-12: 620 pg/mL (ref 232–1245)

## 2021-07-10 LAB — MAGNESIUM: Magnesium: 1.9 mg/dL (ref 1.6–2.3)

## 2021-07-10 LAB — TSH: TSH: 3.77 u[IU]/mL (ref 0.450–4.500)

## 2021-07-10 LAB — VITAMIN D 25 HYDROXY (VIT D DEFICIENCY, FRACTURES): Vit D, 25-Hydroxy: 58.3 ng/mL (ref 30.0–100.0)

## 2021-07-10 NOTE — Progress Notes (Signed)
Contacted via MyChart   Good evening Gracee, your labs have returned: - Kidney function, creatinine and eGFR, remains normal, as is liver function, AST and ALT. Calcium level a little elevated, we will recheck this next visit -- if taking calcium supplements cut back on these. - Cholesterol labs remain stable, continue your Atorvastatin. - CBC shows no anemia or infection. - Thyroid levels are normal. - Vitamin B12 and D + magnesium are normal.  Any questions? Keep being amazing!!  Thank you for allowing me to participate in your care.  I appreciate you. Kindest regards, Hiroko Tregre

## 2021-07-17 ENCOUNTER — Other Ambulatory Visit: Payer: Self-pay

## 2021-07-17 ENCOUNTER — Ambulatory Visit (INDEPENDENT_AMBULATORY_CARE_PROVIDER_SITE_OTHER): Payer: Medicare PPO

## 2021-07-17 DIAGNOSIS — I517 Cardiomegaly: Secondary | ICD-10-CM | POA: Diagnosis not present

## 2021-07-17 LAB — ECHOCARDIOGRAM COMPLETE
AR max vel: 3.58 cm2
AV Area VTI: 3.38 cm2
AV Area mean vel: 3.04 cm2
AV Mean grad: 5 mmHg
AV Peak grad: 8.4 mmHg
Ao pk vel: 1.45 m/s
Area-P 1/2: 3.17 cm2
S' Lateral: 2.7 cm

## 2021-07-17 MED ORDER — PERFLUTREN LIPID MICROSPHERE
1.0000 mL | INTRAVENOUS | Status: AC | PRN
  Administered 2021-07-17: 2 mL via INTRAVENOUS

## 2021-07-17 NOTE — Progress Notes (Signed)
Contacted via MyChart   Good evening [name], I have good news.  Your echo shows a good pump with ejection fraction at 60-65%. There is some mild impairment of relaxation of the heart and mild enlargement of left side of heart, but both are mild and goal is keeping blood pressure under good control.  Valves that were visualized are stable.  Any questions? Keep being stellar!!  Thank you for allowing me to participate in your care.  I appreciate you. Kindest regards, Linzee Depaul

## 2021-07-24 ENCOUNTER — Other Ambulatory Visit: Payer: Self-pay | Admitting: Unknown Physician Specialty

## 2021-07-24 DIAGNOSIS — J4520 Mild intermittent asthma, uncomplicated: Secondary | ICD-10-CM

## 2021-07-24 NOTE — Telephone Encounter (Signed)
rx was sent to pharmacy on 05/23/21 #90/4 ?Requested Prescriptions  ?Pending Prescriptions Disp Refills  ?? nebivolol (BYSTOLIC) 5 MG tablet [Pharmacy Med Name: NEBIVOLOL 5MG  TABLETS] 90 tablet 4  ?  Sig: TAKE 1 TABLET BY MOUTH EVERY DAY  ?  ? Cardiovascular: Beta Blockers 3 Passed - 07/24/2021  3:11 AM  ?  ?  Passed - Cr in normal range and within 360 days  ?  Creatinine, Ser  ?Date Value Ref Range Status  ?07/09/2021 0.87 0.57 - 1.00 mg/dL Final  ?   ?  ?  Passed - AST in normal range and within 360 days  ?  AST  ?Date Value Ref Range Status  ?07/09/2021 18 0 - 40 IU/L Final  ?   ?  ?  Passed - ALT in normal range and within 360 days  ?  ALT  ?Date Value Ref Range Status  ?07/09/2021 13 0 - 32 IU/L Final  ?   ?  ?  Passed - Last BP in normal range  ?  BP Readings from Last 1 Encounters:  ?07/09/21 114/68  ?   ?  ?  Passed - Last Heart Rate in normal range  ?  Pulse Readings from Last 1 Encounters:  ?07/09/21 67  ?   ?  ?  Passed - Valid encounter within last 6 months  ?  Recent Outpatient Visits   ?      ? 2 weeks ago Mild episode of recurrent major depressive disorder ([place])  ? [place], [place] T, NP  ? 1 month ago Morbid obesity ([place])  ? [place], [place] T, NP  ? 2 months ago Lab test positive for detection of COVID-19 virus  ? [place], [place] T, NP  ? 3 months ago Diverticulitis  ? [place], [place] T, NP  ? 3 months ago Exposure to the flu  ? [facility] [place], Henrine [place] T, NP  ?  ?  ? ?  ?  ?  ?Signed Prescriptions Disp Refills  ? SPIRIVA RESPIMAT 1.25 MCG/ACT AERS 4 g 3  ?  Sig: INHALE 2 PUFFS INTO THE LUNGS DAILY  ?  ? Pulmonology:  Anticholinergic Agents Passed - 07/24/2021  3:11 AM  ?  ?  Passed - Valid encounter within last 12 months  ?  Recent Outpatient Visits   ?      ? 2 weeks ago Mild episode of recurrent major depressive disorder ([place])  ? [place], [place] T, NP  ? 1 month ago  Morbid obesity ([place])  ? [place], [place] T, NP  ? 2 months ago Lab test positive for detection of COVID-19 virus  ? [place], [place] T, NP  ? 3 months ago Diverticulitis  ? [place], [place] T, NP  ? 3 months ago Exposure to the flu  ? [facility] [place], Henrine Screws T, NP  ?  ?  ? ?  ?  ?  ? ? ?

## 2021-07-24 NOTE — Telephone Encounter (Signed)
Requested Prescriptions  ?Pending Prescriptions Disp Refills  ?? SPIRIVA RESPIMAT 1.25 MCG/ACT AERS [Pharmacy Med Name: SPIRIVA RESPIMAT 1.25MCG IN 4GM 60D] 4 g 3  ?  Sig: INHALE 2 PUFFS INTO THE LUNGS DAILY  ?  ? Pulmonology:  Anticholinergic Agents Passed - 07/24/2021  3:11 AM  ?  ?  Passed - Valid encounter within last 12 months  ?  Recent Outpatient Visits   ?      ? 2 weeks ago Mild episode of recurrent major depressive disorder (HCC)  ? [facility] [place], [place] T, NP  ? 1 month ago Morbid obesity (HCC)  ? [facility] [place], [place] T, NP  ? 2 months ago Lab test positive for detection of COVID-19 virus  ? [facility] [place], [place] T, NP  ? 3 months ago Diverticulitis  ? [facility] [place], [place] T, NP  ? 3 months ago Exposure to the flu  ? [facility] [place], [place] T, NP  ?  ?  ? ?  ?  ?  ?? nebivolol (BYSTOLIC) 5 MG tablet [Pharmacy Med Name: NEBIVOLOL 5MG  TABLETS] 90 tablet 4  ?  Sig: TAKE 1 TABLET BY MOUTH EVERY DAY  ?  ? Cardiovascular: Beta Blockers 3 Passed - 07/24/2021  3:11 AM  ?  ?  Passed - Cr in normal range and within 360 days  ?  Creatinine, Ser  ?Date Value Ref Range Status  ?07/09/2021 0.87 0.57 - 1.00 mg/dL Final  ?   ?  ?  Passed - AST in normal range and within 360 days  ?  AST  ?Date Value Ref Range Status  ?07/09/2021 18 0 - 40 IU/L Final  ?   ?  ?  Passed - ALT in normal range and within 360 days  ?  ALT  ?Date Value Ref Range Status  ?07/09/2021 13 0 - 32 IU/L Final  ?   ?  ?  Passed - Last BP in normal range  ?  BP Readings from Last 1 Encounters:  ?07/09/21 114/68  ?   ?  ?  Passed - Last Heart Rate in normal range  ?  Pulse Readings from Last 1 Encounters:  ?07/09/21 67  ?   ?  ?  Passed - Valid encounter within last 6 months  ?  Recent Outpatient Visits   ?      ? 2 weeks ago Mild episode of recurrent major depressive disorder (HCC)  ? [facility] [place], [place] T, NP  ? 1 month ago Morbid obesity  (HCC)  ? [facility] [place], [place] T, NP  ? 2 months ago Lab test positive for detection of COVID-19 virus  ? [facility] [place], [place] T, NP  ? 3 months ago Diverticulitis  ? [facility] [place], [place] T, NP  ? 3 months ago Exposure to the flu  ? [facility] [place], [place] T, NP  ?  ?  ? ?  ?  ?  ? ? ?

## 2021-08-18 NOTE — Patient Instructions (Signed)
Fatigue ?If you have fatigue, you feel tired all the time and have a lack of energy or a lack of motivation. Fatigue may make it difficult to start or complete tasks because of exhaustion. In general, occasional or mild fatigue is often a normal response to activity or life. However, long-lasting (chronic) or extreme fatigue may be a symptom of a medical condition. ?Follow these instructions at home: ?General instructions ?Watch your fatigue for any changes. ?Go to bed and get up at the same time every day. ?Avoid fatigue by pacing yourself during the day and getting enough sleep at night. ?Maintain a healthy weight. ?Medicines ?Take over-the-counter and prescription medicines only as told by your health care provider. ?Take a multivitamin, if told by your health care provider.  ?Do not use herbal or dietary supplements unless they are approved by your health care provider. ?Activity ? ?Exercise regularly, as told by your health care provider. ?Use or practice techniques to help you relax, such as yoga, tai chi, meditation, or massage therapy. ?Eating and drinking ? ?Avoid heavy meals in the evening. ?Eat a well-balanced diet, which includes lean proteins, whole grains, plenty of fruits and vegetables, and low-fat dairy products. ?Avoid consuming too much caffeine. ?Avoid the use of alcohol. ?Drink enough fluid to keep your urine pale yellow. ?Lifestyle ?Change situations that cause you stress. Try to keep your work and personal schedule in balance. ?Do not use any products that contain nicotine or tobacco, such as cigarettes and e-cigarettes. If you need help quitting, ask your health care provider. ?Do not use drugs. ?Contact a health care provider if: ?Your fatigue does not get better. ?You have a fever. ?You suddenly lose or gain weight. ?You have headaches. ?You have trouble falling asleep or sleeping through the night. ?You feel angry, guilty, anxious, or sad. ?You are unable to have a bowel movement  (constipation). ?Your skin is dry. ?You have swelling in your legs or another part of your body. ?Get help right away if: ?You feel confused. ?Your vision is blurry. ?You feel faint or you pass out. ?You have a severe headache. ?You have severe pain in your abdomen, your back, or the area between your waist and hips (pelvis). ?You have chest pain, shortness of breath, or an irregular or fast heartbeat. ?You are unable to urinate, or you urinate less than normal. ?You have abnormal bleeding, such as bleeding from the rectum, vagina, nose, lungs, or nipples. ?You vomit blood. ?You have thoughts about hurting yourself or others. ?If you ever feel like you may hurt yourself or others, or have thoughts about taking your own life, get help right away. You can go to your nearest emergency department or call: ?Your local emergency services (911 in the [place] ?A suicide crisis helpline, such as the National Suicide Prevention Lifeline at 1-800-273-8255 or 988 in the [place] This is open 24 hours a day. ?Summary ?If you have fatigue, you feel tired all the time and have a lack of energy or a lack of motivation. ?Fatigue may make it difficult to start or complete tasks because of exhaustion. ?Long-lasting (chronic) or extreme fatigue may be a symptom of a medical condition. ?Exercise regularly, as told by your health care provider. ?Change situations that cause you stress. Try to keep your work and personal schedule in balance. ?This information is not intended to replace advice given to you by your health care provider. Make sure you discuss any questions you have with your health care provider. ?Document Revised:   11/29/2020 Document Reviewed: 03/16/2020 ?Elsevier Patient Education ? 2022 [employer] ? ?

## 2021-08-20 ENCOUNTER — Telehealth (INDEPENDENT_AMBULATORY_CARE_PROVIDER_SITE_OTHER): Payer: Medicare PPO | Admitting: Nurse Practitioner

## 2021-08-20 ENCOUNTER — Encounter: Payer: Self-pay | Admitting: Nurse Practitioner

## 2021-08-20 VITALS — BP 140/80 | HR 62 | Temp 97.1°F

## 2021-08-20 DIAGNOSIS — Z6841 Body Mass Index (BMI) 40.0 and over, adult: Secondary | ICD-10-CM

## 2021-08-20 DIAGNOSIS — E041 Nontoxic single thyroid nodule: Secondary | ICD-10-CM | POA: Diagnosis not present

## 2021-08-20 DIAGNOSIS — G9332 Myalgic encephalomyelitis/chronic fatigue syndrome: Secondary | ICD-10-CM

## 2021-08-20 DIAGNOSIS — E782 Mixed hyperlipidemia: Secondary | ICD-10-CM | POA: Diagnosis not present

## 2021-08-20 MED ORDER — ATORVASTATIN CALCIUM 20 MG PO TABS: 20.0000 mg | ORAL_TABLET | Freq: Every day | ORAL | 4 refills | Status: DC

## 2021-08-20 MED ORDER — FLUTICASONE-SALMETEROL 250-50 MCG/ACT IN AEPB: INHALATION_SPRAY | RESPIRATORY_TRACT | 4 refills | Status: DC

## 2021-08-20 NOTE — Assessment & Plan Note (Signed)
Followed by Dr. Tami Ribas.  Due to continued c/o fatigue will repeat thyroid labs outpatient, ?some level of subclinical thyroid disease due to chronic fatigue, cold intolerance, and constipation.  May benefit from trial of Levothyroxine in future.  At this time will repeat thyroid u/s for reassurance. ?

## 2021-08-20 NOTE — Progress Notes (Signed)
? ?BP 140/80   Pulse 62   Temp (!) 97.1 ?F (36.2 ?C) (Oral)   LMP  (LMP Unknown)   SpO2 97%   ? ?Subjective:  ? ? Patient ID: [name], female    DOB: Jun 14, 1940, 81 y.o.   MRN: [id number] ? ?HPI: ?[name] is a 81 y.o. female ? ?Chief Complaint  ?Patient presents with  ? Fatigue  ?  Patient is here for follow up on Fatigue. Patient states everything is about the same.   ? ?This visit was completed via video visit through MyChart due to the restrictions of the COVID-19 pandemic. All issues as above were discussed and addressed. Physical exam was done as above through visual confirmation on video through MyChart. If it was felt that the patient should be evaluated in the office, they were directed there. The patient verbally consented to this visit. ?Location of the patient: home ?Location of the provider: work ?Those involved with this call:  ?Provider: [name], DNP ?CMA: Irena Reichmann, CMA ?Front Desk/Registration: [employer]  ?Time spent on call:  21 minutes with patient face to face via video conference. More than 50% of this time was spent in counseling and coordination of care. 15 minutes total spent in review of patient's record and preparation of their chart.  ?I verified patient identity using two factors (patient name and date of birth). Patient consents verbally to being seen via telemedicine visit today.   ? ?FATIGUE ?Ongoing issue, was noted to have some stable cardiomegaly on recent chest imaging, an echo was obtained and this showed EF 60-65% with some Grade I diastolic dysfunction and mild LVH.  Aortic and mitral valves intact.  Labs overall reassuring recently, with exception of CA+ being mildly elevated and cholesterol levels above goal. ?Has ongoing fatigue, this has not changed.  Was going for thyroid u/s in past due to nodule -- biopsy negative in past. ?Duration:  chronic ?Severity: 7/10  ?Onset: gradual ?Context when symptoms started:  unknown ?Symptoms improve with rest: yes   ?Depressive symptoms: no ?Stress/anxiety: no ?Insomnia: gets 5-7 hours ?Daytime hypersomnolence:no ?Wakes feeling refreshed: yes ?History of sleep study: yes ?Dysnea on exertion:  no ?Orthopnea/PND: no ?Chest pain: no ?Chronic cough: no ?Lower extremity edema: yes ?Arthralgias: occasional ?Myalgias: no ?Weakness: no ?Rash: no  ? ?Relevant past medical, surgical, family and social history reviewed and updated as indicated. Interim medical history since our last visit reviewed. ?Allergies and medications reviewed and updated. ? ?Review of Systems  ?Constitutional:  Positive for fatigue. Negative for activity change, appetite change, chills and fever.  ?HENT: Negative.    ?Eyes:  Negative for pain and visual disturbance.  ?Respiratory:  Negative for cough, chest tightness, shortness of breath and wheezing.   ?Cardiovascular:  Negative for chest pain, palpitations and leg swelling.  ?Gastrointestinal: Negative.   ?Neurological:  Negative for dizziness, numbness and headaches.  ?Psychiatric/Behavioral: Negative.    ? ?Per HPI unless specifically indicated above ? ?   ?Objective:  ?  ?BP 140/80   Pulse 62   Temp (!) 97.1 ?F (36.2 ?C) (Oral)   LMP  (LMP Unknown)   SpO2 97%   ?Wt Readings from Last 3 Encounters:  ?06/04/21 (!) 340 lb (154.2 kg)  ?11/29/20 (!) 340 lb (154.2 kg)  ?07/12/20 (!) 320 lb (145.2 kg)  ?  ?Physical Exam ?Vitals and nursing note reviewed.  ?Constitutional:   ?   General: She is awake. She is not in acute distress. ?  Appearance: She is well-developed and well-groomed. She is obese. She is not ill-appearing or toxic-appearing.  ?HENT:  ?   Head: Normocephalic.  ?   Right Ear: Hearing normal.  ?   Left Ear: Hearing normal.  ?Eyes:  ?   General: Lids are normal.     ?   Right eye: No discharge.     ?   Left eye: No discharge.  ?   Conjunctiva/sclera: Conjunctivae normal.  ?Pulmonary:  ?   Effort: Pulmonary effort is normal. No accessory muscle usage or respiratory distress.  ?Musculoskeletal:  ?    Cervical back: Normal range of motion.  ?Neurological:  ?   Mental Status: She is alert and oriented to person, place, and time.  ?Psychiatric:     ?   Attention and Perception: Attention normal.     ?   Mood and Affect: Mood normal.     ?   Behavior: Behavior normal. Behavior is cooperative.     ?   Thought Content: Thought content normal.     ?   Judgment: Judgment normal.  ? ?Results for orders placed or performed in visit on 07/17/21  ?ECHOCARDIOGRAM COMPLETE  ?Result Value Ref Range  ? AR max vel 3.58 cm2  ? AV Peak grad 8.4 mmHg  ? Ao pk vel 1.45 m/s  ? S' Lateral 2.70 cm  ? Area-P 1/2 3.17 cm2  ? AV Area VTI 3.38 cm2  ? AV Mean grad 5.0 mmHg  ? AV Area mean vel 3.04 cm2  ? ?   ?Assessment & Plan:  ? ?Problem List Items Addressed This Visit   ? ?  ? Endocrine  ? Thyroid nodule  ?  Followed by Dr. Tami Ribas.  Due to continued c/o fatigue will repeat thyroid labs outpatient, ?some level of subclinical thyroid disease due to chronic fatigue, cold intolerance, and constipation.  May benefit from trial of Levothyroxine in future.  At this time will repeat thyroid u/s for reassurance. ?  ?  ? Relevant Orders  ? Thyroid peroxidase antibody  ? T4, free  ? TSH  ? US THYROID  ?  ? Nervous and Auditory  ? Chronic fatigue disorder  ?  Ongoing fatigue issues, ?related to subclinical hypothyroid vs related to weight.  Recent labs overall reassuring.  Will repeat thyroid ultrasound and check further labs outpatient to assess for other issues for long term fatigue. ?  ?  ? Relevant Orders  ? C-reactive protein  ? Sed Rate (ESR)  ? ANA w/Reflex if Positive  ? Alpha-1-antitrypsin  ? CK (Creatine Kinase)  ? US THYROID  ?  ? Other  ? BMI 50.0-59.9, adult [facility]) - Primary  ?  Recommend continued focus on healthy diet choices and regular physical activity (30 minutes 5 days a week).  Not weighed today, in electric W/C. ?  ?  ? Hyperlipidemia  ?  Chronic, ongoing.  Continue current medication regimen and adjust as needed.  Lipid panel  outpatient. ?  ?  ? Relevant Medications  ? atorvastatin (LIPITOR) 20 MG tablet  ? Other Relevant Orders  ? Lipid Panel w/o Chol/HDL Ratio  ? Morbid obesity ([place])  ?  BMI 59.28 with underlying HTN and ASTHMA.  Recommended eating smaller high protein, low fat meals more frequently and exercising 30 mins a day 5 times a week with a goal of 10-15lb weight loss in the next 3 months. Patient voiced their understanding and motivation to adhere to these recommendations. ?underlying cause  of fatigue. ? ?  ?  ?  ?I discussed the assessment and treatment plan with the patient. The patient was provided an opportunity to ask questions and all were answered. The patient agreed with the plan and demonstrated an understanding of the instructions. ? ? ?The patient was advised to call back or seek an in-person evaluation if the symptoms worsen or if the condition fails to improve as anticipated. ? ? ?I provided 21+ minutes of time during this encounter.  ? ?Follow up plan: ?Return in about 5 weeks (around 09/24/2021) for fatigue and thryoid + cholesterol. ? ? ? ? ? ?

## 2021-08-20 NOTE — Assessment & Plan Note (Signed)
Recommend continued focus on healthy diet choices and regular physical activity (30 minutes 5 days a week).  Not weighed today, in electric W/C. 

## 2021-08-20 NOTE — Progress Notes (Signed)
Pt scheduled  

## 2021-08-20 NOTE — Assessment & Plan Note (Signed)
Chronic, ongoing.  Continue current medication regimen and adjust as needed.  Lipid panel outpatient. ?

## 2021-08-20 NOTE — Assessment & Plan Note (Signed)
Ongoing fatigue issues, ?related to subclinical hypothyroid vs related to weight.  Recent labs overall reassuring.  Will repeat thyroid ultrasound and check further labs outpatient to assess for other issues for long term fatigue. ?

## 2021-08-20 NOTE — Assessment & Plan Note (Signed)
BMI 59.28 with underlying HTN and ASTHMA.  Recommended eating smaller high protein, low fat meals more frequently and exercising 30 mins a day 5 times a week with a goal of 10-15lb weight loss in the next 3 months. Patient voiced their understanding and motivation to adhere to these recommendations. ?underlying cause of fatigue. ? ?

## 2021-09-04 ENCOUNTER — Telehealth (INDEPENDENT_AMBULATORY_CARE_PROVIDER_SITE_OTHER): Payer: Medicare PPO | Admitting: Unknown Physician Specialty

## 2021-09-04 ENCOUNTER — Encounter: Payer: Self-pay | Admitting: Unknown Physician Specialty

## 2021-09-04 ENCOUNTER — Other Ambulatory Visit: Payer: Medicare PPO

## 2021-09-04 DIAGNOSIS — K5900 Constipation, unspecified: Secondary | ICD-10-CM | POA: Diagnosis not present

## 2021-09-04 DIAGNOSIS — L03116 Cellulitis of left lower limb: Secondary | ICD-10-CM | POA: Diagnosis not present

## 2021-09-04 MED ORDER — DOXYCYCLINE HYCLATE 100 MG PO TABS: 100.0000 mg | ORAL_TABLET | Freq: Two times a day (BID) | ORAL | 1 refills | Status: DC

## 2021-09-04 NOTE — Progress Notes (Addendum)
? ?LMP  (LMP Unknown)   ? ?Subjective:  ? ? Patient ID: LEANE LORING, female    DOB: July 03, 1940, 81 y.o.   MRN: [id number] ? ?HPI: ?ALANI SABBAGH is a 81 y.o. female ? ?Chief Complaint  ?Patient presents with  ? Cellulitis  ?  Patient states she is here for cellulitis on the back of her leg. Patient states she first noticed it on Sunday. Patient denies having any drainage from the area. Patient says she is having constantly having itching in the area.   ? ?This visit was completed via telephone due to the restrictions of the COVID-19 pandemic. All issues as above were discussed and addressed but no physical exam was performed. If it was felt that the patient should be evaluated in the office, they were directed there. The patient verbally consented to this visit. Patient was unable to complete an audio/visual visit due to Technical difficulties. ?Location of the patient: home ?Location of the provider: work ?Time spent on call:20 minutes on the phone with 10 minute chart review.  I verified patient identity using two factors (patient name and date of birth). Patient consents verbally to being seen via telemedicine visit today.   ?Pt is confident she has cellulitis on the back of her leg.  Due to a history of lymphedema, this is not uncommon for her.  Unable to view it or show to me.  She states it is warm to touch with intense itching. This is the same feeling she has gotten with cellulitis in the past.  No fever, nausea, or vomiting ? ?Constipation- ?Pt states she is having constipation.  This seems worse since making a diet change of drinking [employer] shakes but that is unclear.  Also stopped Ca supplements in the last week ? ?Relevant past medical, surgical, family and social history reviewed and updated as indicated. Interim medical history since our last visit reviewed. ?Allergies and medications reviewed and updated. ? ?Review of Systems  ?Constitutional: Negative.   ?Respiratory: Negative.    ?Cardiovascular:  Negative.   ?Neurological: Negative.   ? ?Per HPI unless specifically indicated above ? ?   ?Objective:  ?  ?LMP  (LMP Unknown)   ?Wt Readings from Last 3 Encounters:  ?06/04/21 (!) 340 lb (154.2 kg)  ?11/29/20 (!) 340 lb (154.2 kg)  ?07/12/20 (!) 320 lb (145.2 kg)  ?  ?Physical Exam ?Constitutional:   ?   General: She is not in acute distress. ?   Appearance: Normal appearance. She is well-developed.  ?Eyes:  ?   General: Lids are normal.  ?Abdominal:  ?   Palpations: There is no hepatomegaly or splenomegaly.  ?Skin: ?   Coloration: Skin is not pale.  ?   Findings: No rash.  ?Neurological:  ?   Mental Status: She is alert and oriented to person, place, and time.  ?Psychiatric:     ?   Behavior: Behavior normal.     ?   Thought Content: Thought content normal.     ?   Judgment: Judgment normal.  ? ? ?Results for orders placed or performed in visit on 07/17/21  ?ECHOCARDIOGRAM COMPLETE  ?Result Value Ref Range  ? AR max vel 3.58 cm2  ? AV Peak grad 8.4 mmHg  ? Ao pk vel 1.45 m/s  ? S' Lateral 2.70 cm  ? Area-P 1/2 3.17 cm2  ? AV Area VTI 3.38 cm2  ? AV Mean grad 5.0 mmHg  ? AV Area mean vel 3.04 cm2  ? ?   ?  Assessment & Plan:  ? ?Problem List Items Addressed This Visit   ?None ?Visit Diagnoses   ? ? Cellulitis of left lower extremity    -  Primary  ? Unable to see today, but symptoms consistent and a recurring problem.  Rx for Doxycycline x 1 week.  RTC if no improvement  ? Constipation, unspecified constipation type      ? Acute on chronic.  Discussed fiber and possibly Ca supplements contributing.  Already drinking a lot of water.  Add dried fruit such as prunes  ? ?  ?  ? ?Follow up plan: ?Return if symptoms worsen or fail to improve. ? ? ? ? ? ?

## 2021-09-17 ENCOUNTER — Other Ambulatory Visit: Payer: Medicare PPO

## 2021-09-24 ENCOUNTER — Telehealth (INDEPENDENT_AMBULATORY_CARE_PROVIDER_SITE_OTHER): Payer: Medicare PPO | Admitting: Nurse Practitioner

## 2021-09-24 ENCOUNTER — Encounter: Payer: Self-pay | Admitting: Nurse Practitioner

## 2021-09-24 VITALS — BP 137/68 | HR 69 | Temp 97.1°F

## 2021-09-24 DIAGNOSIS — R399 Unspecified symptoms and signs involving the genitourinary system: Secondary | ICD-10-CM

## 2021-09-24 DIAGNOSIS — M170 Bilateral primary osteoarthritis of knee: Secondary | ICD-10-CM | POA: Insufficient documentation

## 2021-09-24 DIAGNOSIS — L03115 Cellulitis of right lower limb: Secondary | ICD-10-CM | POA: Diagnosis not present

## 2021-09-24 NOTE — Assessment & Plan Note (Addendum)
Bilateral and chronic with bone on bone pathology per imaging at [facility] in 2020.  At this time recommend continue to use Voltaren gel at home as needed + alternate heat and ice.  Recommend Tylenol 1000 MG TID for pain (max dosing 3000 MG daily).  Will order home health for PT, which may benefit pain and assist with fall prevention. Discussed at length with patient.  Continue to use chair and walker as needed.  Would benefit from modest weight loss. ?

## 2021-09-24 NOTE — Progress Notes (Signed)
? ?BP 137/68   Pulse 69   Temp (!) 97.1 ?F (36.2 ?C) (Oral)   LMP  (LMP Unknown)   SpO2 96%   ? ?Subjective:  ? ? Patient ID: [name], female    DOB: 03-02-1941, 81 y.o.   MRN: [id number] ? ?HPI: ?[name] is a 81 y.o. female ? ?Chief Complaint  ?Patient presents with  ? Cellulitis  ? Urinary Tract Infection  ?  Patient states she is having pain when she emptying her bladder, urinary frequency and going often. Patient states she has been drinking Cranberry Juice Cocktail and Balance Complex supplement. Patient states she is currently taking the second round of Doxycycline. Patient says she thinks the UTI symptoms are lighten up.   ? Arthritis  ?  Patient is having Arthritic pain in her knees. Patient would like to discuss at today's visit.   ? ?This visit was completed via video visit through MyChart due to the restrictions of the COVID-19 pandemic. All issues as above were discussed and addressed. Physical exam was done as above through visual confirmation on video through MyChart. If it was felt that the patient should be evaluated in the office, they were directed there. The patient verbally consented to this visit. ?Location of the patient: home ?Location of the provider: work ?Those involved with this call:  ?Provider: Aura Dials, DNP ?CMA: Malen Gauze, CMA ?Front Desk/Registration: [employer]  ?Time spent on call:  21 minutes with patient face to face via video conference. More than 50% of this time was spent in counseling and coordination of care. 15 minutes total spent in review of patient's record and preparation of their chart.  ?I verified patient identity using two factors (patient name and date of birth). Patient consents verbally to being seen via telemedicine visit today.   ? ?SKIN INFECTION ?Started on Doxycycline for cellulitis 09/04/21 to right lower leg.  This has improved with no further symptoms. ?Duration: days ?Location: right leg ?Redness: no ?Swelling: no ?Oozing: no ?Pus:  no ?Fevers: no ?Nausea/vomiting: no ?Status: better ?Treatments attempted:antibiotics  ?Tetanus: UTD  ? ?URINARY SYMPTOMS ?Started to have urinary symptoms on Saturday morning, recently completed Doxycycline for cellulitis one week ago, which she took extra of to try to help UTI symptoms. ?Dysuria: yes ?Urinary frequency: yes ?Urgency: yes ?Small volume voids: yes ?Symptom severity: no ?Urinary incontinence: no ?Foul odor: no ?Hematuria: no ?Abdominal pain: no ?Back pain: no ?Suprapubic pain/pressure: no ?Flank pain: no ?Fever:  no ?Vomiting: no ?Relief with cranberry juice: no ?Status: stable ?Previous urinary tract infection: yes ?Recurrent urinary tract infection: no ?Sexual activity: No sexually active ?History of sexually transmitted disease: no ?Treatments attempted: antibiotics, cranberry, and increasing fluids   ? ?KNEE PAIN ?Pain to both knees ongoing, about the same.  Taking Move Free pills every other day, can not take every day due to high calcium level.  Last imaging was with ortho at [facility] in 2020, when she was told there was nothing that could be done -- bone on bone pathology both knees. ?Duration: chronic ?Involved knee: bilateral ?Mechanism of injury: unknown ?Location:anterior ?Onset: gradual ?Severity: 5/10  ?Quality:  pressure, sharp, aching ?Frequency: constant ?Radiation: no ?Aggravating factors: weight bearing, walking, bending, and movement  ?Alleviating factors: as below  ?Status: worse ?Treatments attempted: rest and APAP + Voltaren, Blue ?Relief with NSAIDs?:  No NSAIDs Taken ?Weakness with weight bearing or walking: yes ?Sensation of giving way: yes ?Locking: yes ?Popping: no ?Bruising: no ?Swelling: yes ?Redness:  no ?Paresthesias/decreased sensation: no ?Fevers: no  ? ?Relevant past medical, surgical, family and social history reviewed and updated as indicated. Interim medical history since our last visit reviewed. ?Allergies and medications reviewed and updated. ? ?Review of Systems   ?Constitutional:  Negative for activity change, appetite change, chills, fatigue and fever.  ?HENT: Negative.    ?Eyes:  Negative for pain and visual disturbance.  ?Respiratory:  Negative for cough, chest tightness, shortness of breath and wheezing.   ?Cardiovascular:  Negative for chest pain, palpitations and leg swelling.  ?Gastrointestinal: Negative.   ?Genitourinary:  Positive for dysuria, frequency and urgency. Negative for decreased urine volume, difficulty urinating, hematuria and vaginal discharge.  ?Musculoskeletal:  Positive for arthralgias.  ?Skin:  Negative for wound.  ?Neurological:  Negative for dizziness, numbness and headaches.  ?Psychiatric/Behavioral: Negative.    ? ?Per HPI unless specifically indicated above ? ?   ?Objective:  ?  ?BP 137/68   Pulse 69   Temp (!) 97.1 ?F (36.2 ?C) (Oral)   LMP  (LMP Unknown)   SpO2 96%   ?Wt Readings from Last 3 Encounters:  ?06/04/21 (!) 340 lb (154.2 kg)  ?11/29/20 (!) 340 lb (154.2 kg)  ?07/12/20 (!) 320 lb (145.2 kg)  ?  ?Physical Exam ?Vitals and nursing note reviewed.  ?Constitutional:   ?   General: She is awake. She is not in acute distress. ?   Appearance: She is well-developed. She is not ill-appearing.  ?HENT:  ?   Head: Normocephalic.  ?   Right Ear: Hearing normal.  ?   Left Ear: Hearing normal.  ?Eyes:  ?   General: Lids are normal.     ?   Right eye: No discharge.     ?   Left eye: No discharge.  ?   Conjunctiva/sclera: Conjunctivae normal.  ?Pulmonary:  ?   Effort: Pulmonary effort is normal. No accessory muscle usage or respiratory distress.  ?Musculoskeletal:  ?   Cervical back: Normal range of motion.  ?Neurological:  ?   Mental Status: She is alert and oriented to person, place, and time.  ?Psychiatric:     ?   Attention and Perception: Attention normal.     ?   Mood and Affect: Mood normal.     ?   Behavior: Behavior normal. Behavior is cooperative.     ?   Thought Content: Thought content normal.     ?   Judgment: Judgment normal.   ? ? ?Results for orders placed or performed in visit on 07/17/21  ?ECHOCARDIOGRAM COMPLETE  ?Result Value Ref Range  ? AR max vel 3.58 cm2  ? AV Peak grad 8.4 mmHg  ? Ao pk vel 1.45 m/s  ? S' Lateral 2.70 cm  ? Area-P 1/2 3.17 cm2  ? AV Area VTI 3.38 cm2  ? AV Mean grad 5.0 mmHg  ? AV Area mean vel 3.04 cm2  ? ?   ?Assessment & Plan:  ? ?Problem List Items Addressed This Visit   ? ?  ? Musculoskeletal and Integument  ? Osteoarthritis of both knees - Primary  ?  Bilateral and chronic with bone on bone pathology [facility].  At this time recommend continue to use Voltaren gel at home as needed + alternate heat and ice.  Recommend Tylenol 1000 MG TID for pain (max dosing 3000 MG daily).  Will order home health for PT, which may benefit pain and assist with fall prevention. Discussed at length  with patient.  Continue to use chair and walker as needed.  Would benefit from modest weight loss. ? ?  ?  ? ?Other Visit Diagnoses   ? ? Urinary symptom or sign      ? Will obtain outpatient urinalysis and urine culture.  Treat as needed.  Discussed with patient.  ? Relevant Orders  ? Urinalysis, Routine w reflex microscopic  ? Urine Culture  ? Cellulitis of right lower extremity      ? Improved at this time after treatment, no further intervention.  ? ?  ?  ?I discussed the assessment and treatment plan with the patient. The patient was provided an opportunity to ask questions and all were answered. The patient agreed with the plan and demonstrated an understanding of the instructions. ? ? ?The patient was advised to call back or seek an in-person evaluation if the symptoms worsen or if the condition fails to improve as anticipated. ? ? ?I provided 21+ minutes of time during this encounter.  ? ?Follow up plan: ?Return in about 8 weeks (around 11/19/2021) for KNEE PAIN. ? ? ? ? ? ?

## 2021-09-24 NOTE — Patient Instructions (Signed)
 Chronic Knee Pain, Adult Knee pain that lasts longer than 3 months is called chronic knee pain. You may have pain in one or both knees. Symptoms of chronic knee pain may also include swelling and stiffness. The most common cause is age-related wear and tear (osteoarthritis) of your knee joint. Many conditions can cause chronic knee pain. Treatment depends on the cause. The main treatments are physical therapy and weight loss. It may also be treated with medicines, injections, a knee sleeve or brace, and by using crutches. Rest, ice, pressure (compression), and elevation, also known as RICE therapy, may also be recommended. Follow these instructions at home: If you have a knee sleeve or brace:  Wear the knee sleeve or brace as told by your doctor. Take it off only as told by your doctor. Loosen it if your toes: Tingle. Become numb. Turn cold and blue. Keep it clean. If the sleeve or brace is not waterproof: Do not let it get wet. Ask your doctor if you may take it off when you take a bath or shower. If not, cover it with a watertight covering. Managing pain, stiffness, and swelling     If told, put heat on your knee. Do this as often as told by your doctor. Use the heat source that your doctor recommends, such as a moist heat pack or a heating pad. If you have a removable knee sleeve or brace, take it off as told by your doctor. Place a towel between your skin and the heat source. Leave the heat on for 20-30 minutes. Take off the heat if your skin turns bright red. This is very important. If you cannot feel pain, heat, or cold, you have a greater risk of getting burned. If told, put ice on your knee. To do this: If you have a removable knee sleeve or brace, take it off as told by your doctor. Put ice in a plastic bag. Place a towel between your skin and the bag. Leave the ice on for 20 minutes, 2-3 times a day. Take off the ice if your skin turns bright red. This is very important. If  you cannot feel pain, heat, or cold, you have a greater risk of damage to the area. Move your toes often. Raise the injured area above the level of your heart while you are sitting or lying down. Activity Avoid activities where both feet leave the ground at the same time (high-impact activities). Examples are running, jumping rope, and doing jumping jacks. Follow the exercise plan that your doctor makes for you. Your doctor may suggest that you: Avoid activities that make knee pain worse. You may need to change the exercises that you do, the sports that you participate in, or your job duties. Wear shoes with cushioned soles. Avoid sports that require running and sudden changes in direction. Do exercises or physical therapy. This is planned to match your needs and your abilities. Do exercises that increase your balance and strength, such as tai chi and yoga. Do not use your injured knee to support your body weight until your doctor says that you can. Use crutches as told by your doctor. Return to your normal activities when your doctor says that it is safe. General instructions Take over-the-counter and prescription medicines only as told by your doctor. If you are overweight, work with your doctor and a food expert (dietitian) to set goals to lose weight. Being overweight can make your knee hurt more. Do not smoke or use   products that contain nicotine or tobacco. If you need help quitting, ask your doctor. ?Keep all follow-up visits. ?Contact a doctor if: ?You have knee pain that is not getting better or gets worse. ?You are not able to do your exercises due to knee pain. ?Get help right away if: ?Your knee swells and the swelling gets worse. ?You cannot move your knee. ?You have very bad knee pain. ?Summary ?Knee pain that lasts more than 3 months is called chronic knee pain. ?The main treatments for chronic knee pain are physical therapy and weight loss. You may also need to take medicines, wear a  knee sleeve or brace, use crutches, and put ice or heat on your knee. ?Lose weight if you are overweight. Work with your doctor and a food expert (dietitian) to help you set goals to lose weight. Being overweight can make your knee hurt more. ?Follow the exercise plan that your doctor makes for you. ?This information is not intended to replace advice given to you by your health care provider. Make sure you discuss any questions you have with your health care provider. ?Document Revised: 10/20/2019 Document Reviewed: 10/20/2019 ?Elsevier Patient Education ? [place]. ? ?

## 2021-09-25 ENCOUNTER — Encounter: Payer: Self-pay | Admitting: Nurse Practitioner

## 2021-09-25 DIAGNOSIS — R399 Unspecified symptoms and signs involving the genitourinary system: Secondary | ICD-10-CM | POA: Diagnosis not present

## 2021-09-25 LAB — URINALYSIS, ROUTINE W REFLEX MICROSCOPIC
Bilirubin, UA: NEGATIVE
Glucose, UA: NEGATIVE
Ketones, UA: NEGATIVE
Nitrite, UA: POSITIVE — AB
RBC, UA: NEGATIVE
Specific Gravity, UA: 1.02 (ref 1.005–1.030)
Urobilinogen, Ur: 0.2 mg/dL (ref 0.2–1.0)
pH, UA: 7.5 (ref 5.0–7.5)

## 2021-09-25 LAB — MICROSCOPIC EXAMINATION

## 2021-09-25 NOTE — Progress Notes (Signed)
Contacted via MyChart ? ? ?Good evening [name], your urine has returned.  I am waiting on culture to see what this shows in regard to best treatment and bacteria present.  Urinalysis does look like infection is present though. I do not think the Doxycyline will offer much benefit for this.  Have you taken Keflex before?  Sometimes with penicillin allergy we do not give these as there may be cross reactivity, but some patients do fine with this family of medication.  Have you taken Macrobid?  Trying to figure out what would be best to start you on for UTI and then we can change if needed depending on culture results.  Let me know.  ?Keep being amazing!!  Thank you for allowing me to participate in your care.  I appreciate you. ?Kindest regards, ?Jamerson Vonbargen ?

## 2021-09-26 ENCOUNTER — Telehealth: Payer: Self-pay | Admitting: Nurse Practitioner

## 2021-09-26 NOTE — Telephone Encounter (Signed)
Dewana w/ [facility] calling to let [name] know the pt requested to wait in until May 22 to begin in home PT due to a death in the family. ? ?Cb [phone]  ext [phone] ?

## 2021-09-27 MED ORDER — NITROFURANTOIN MONOHYD MACRO 100 MG PO CAPS: 100.0000 mg | ORAL_CAPSULE | Freq: Two times a day (BID) | ORAL | 0 refills | Status: DC

## 2021-09-29 ENCOUNTER — Encounter: Payer: Self-pay | Admitting: Nurse Practitioner

## 2021-09-29 LAB — URINE CULTURE

## 2021-09-29 NOTE — Progress Notes (Signed)
Contacted via MyChart ? ? ?Good evening Latrish, so we have run into a problem.  The Macrobid is resistant to what is growing in your urine.  Everything you are allergic to works:( We could try something like Keflex or Vantin to treat this --- which is in the family as Cefazolin and Ceftriaxone.  These can sometimes cause cross reaction in patients who are allergic to penicillin, but this does not always happen.  I see you get rash with penicillin products.  Do you want to try Keflex or Vantin and see if rash presents with this? ?Keep being amazing!!  Thank you for allowing me to participate in your care.  I appreciate you. ?Kindest regards, ?Aster Screws ?

## 2021-10-01 MED ORDER — CEPHALEXIN 500 MG PO CAPS: 500.0000 mg | ORAL_CAPSULE | Freq: Three times a day (TID) | ORAL | 0 refills | Status: AC

## 2021-10-08 ENCOUNTER — Telehealth: Payer: Self-pay | Admitting: Nurse Practitioner

## 2021-10-08 NOTE — Telephone Encounter (Signed)
Copied from CRM [id number]. Topic: General - Other >> Oct 08, 2021 11:29 AM Traci Sermon wrote: Reason for CRM: Octaviano Glow from [facility] CB#: [phone], called in stating pt wanted to wait to start her PT until Wednesday May, 31.

## 2021-10-08 NOTE — Telephone Encounter (Signed)
Noted  

## 2021-10-17 ENCOUNTER — Encounter: Payer: Self-pay | Admitting: Nurse Practitioner

## 2021-10-17 ENCOUNTER — Telehealth (INDEPENDENT_AMBULATORY_CARE_PROVIDER_SITE_OTHER): Payer: Medicare PPO | Admitting: Internal Medicine

## 2021-10-17 ENCOUNTER — Encounter: Payer: Self-pay | Admitting: Internal Medicine

## 2021-10-17 DIAGNOSIS — N390 Urinary tract infection, site not specified: Secondary | ICD-10-CM | POA: Insufficient documentation

## 2021-10-17 MED ORDER — CEPHALEXIN 500 MG PO CAPS: 500.0000 mg | ORAL_CAPSULE | Freq: Two times a day (BID) | ORAL | 0 refills | Status: AC

## 2021-10-17 NOTE — Progress Notes (Unsigned)
LMP  (LMP Unknown)    Subjective:    Patient ID: [name], female    DOB: 29-May-1940, 81 y.o.   MRN: [id number]  No chief complaint on file.   HPI: [name] is a 81 y.o. female   This visit was completed via telephone due to the restrictions of the COVID-19 pandemic. All issues as above were discussed and addressed but no physical exam was performed. If it was felt that the patient should be evaluated in the office, they were directed there. The patient verbally consented to this visit. Patient was unable to complete an audio/visual visit due to Technical difficulties. Due to the catastrophic nature of the COVID-19 pandemic, this visit was done through audio contact only. Location of the patient: home Location of the provider: work Those involved with this call:  Provider: Loura Pardon, MD CMA: Tristan Schroeder, CMA Front Desk/Registration: [employer]  Time spent on call: 10 minutes on the phone discussing health concerns. 10 minutes total spent in review of patient's record and preparation of their chart.    Urinary Frequency  This is a recurrent (was rx with abx 1 week  keflex.) problem. The current episode started in the past 7 days. Associated symptoms include frequency. Pertinent negatives include no chills, discharge, flank pain, hematuria, hesitancy, nausea, possible pregnancy, sweats, urgency or vomiting. Associated symptoms comments: Having to go every 20 mins Stopped abx Wednesday last week .   No chief complaint on file.   Relevant past medical, surgical, family and social history reviewed and updated as indicated. Interim medical history since our last visit reviewed. Allergies and medications reviewed and updated.  Review of Systems  Constitutional:  Negative for chills.  Gastrointestinal:  Negative for nausea and vomiting.  Genitourinary:  Positive for frequency. Negative for flank pain, hematuria, hesitancy and urgency.   Per HPI unless specifically  indicated above     Objective:    LMP  (LMP Unknown)   Wt Readings from Last 3 Encounters:  06/04/21 (!) 340 lb (154.2 kg)  11/29/20 (!) 340 lb (154.2 kg)  07/12/20 (!) 320 lb (145.2 kg)    Physical Exam Unable to peform sec to virtual visit.   Results for orders placed or performed in visit on 09/24/21  Urine Culture   Specimen: Urine   UR  Result Value Ref Range   Urine Culture, Routine Final report (A)    Organism ID, Bacteria Proteus mirabilis (A)    Antimicrobial Susceptibility Comment   Microscopic Examination   Urine  Result Value Ref Range   WBC, UA 11-30 (A) 0 - 5 /hpf   RBC 0-2 0 - 2 /hpf   Epithelial Cells (non renal) 0-10 0 - 10 /hpf   Crystals Present (A) N/A   Crystal Type Triple Phosphate N/A   Mucus, UA Present (A) Not Estab.   Bacteria, UA Many (A) None seen/Few  Urinalysis, Routine w reflex microscopic  Result Value Ref Range   Specific Gravity, UA 1.020 1.005 - 1.030   pH, UA 7.5 5.0 - 7.5   Color, UA Yellow Yellow   Appearance Ur Cloudy (A) Clear   Leukocytes,UA 2+ (A) Negative   Protein,UA 1+ (A) Negative/Trace   Glucose, UA Negative Negative   Ketones, UA Negative Negative   RBC, UA Negative Negative   Bilirubin, UA Negative Negative   Urobilinogen, Ur 0.2 0.2 - 1.0 mg/dL   Nitrite, UA Positive (A) Negative   Microscopic Examination See below:  Current Outpatient Medications:    cephALEXin (KEFLEX) 500 MG capsule, Take 1 capsule (500 mg total) by mouth 2 (two) times daily for 5 days., Disp: 10 capsule, Rfl: 0   albuterol (PROVENTIL) (2.5 MG/3ML) 0.083% nebulizer solution, Take 3 mLs (2.5 mg total) by nebulization every 6 (six) hours as needed for wheezing or shortness of breath., Disp: 150 mL, Rfl: 1   albuterol (VENTOLIN HFA) 108 (90 Base) MCG/ACT inhaler, Inhale 2 puffs into the lungs every 6 (six) hours as needed for wheezing or shortness of breath., Disp: 18 g, Rfl: 2   Apple Cider Vinegar 500 MG TABS, Take 500 mg by mouth with  breakfast, with lunch, and with evening meal., Disp: , Rfl:    Ascorbic Acid (VITAMIN C) 1000 MG tablet, Take 1,000 mg by mouth daily., Disp: , Rfl:    aspirin EC 81 MG tablet, Take 81 mg by mouth every evening. Swallow whole., Disp: , Rfl:    atorvastatin (LIPITOR) 20 MG tablet, Take 1 tablet (20 mg total) by mouth daily., Disp: 90 tablet, Rfl: 4   BALANCED B COMPLEX CR PO, Take by mouth., Disp: , Rfl:    buPROPion (WELLBUTRIN SR) 150 MG 12 hr tablet, Take 1 tablet (150 mg total) by mouth 2 (two) times daily., Disp: 180 tablet, Rfl: 4   cetirizine (ZYRTEC) 10 MG tablet, Take 10 mg by mouth daily., Disp: , Rfl:    Cholecalciferol (VITAMIN D3) 125 MCG (5000 UT) TABS, Take 5,000 Units by mouth in the morning and at bedtime. Morning & afternoon, Disp: , Rfl:    docusate sodium (COLACE) 100 MG capsule, Take 100 mg by mouth 2 (two) times daily as needed (constipation)., Disp: , Rfl:    fluticasone-salmeterol (WIXELA INHUB) 250-50 MCG/ACT AEPB, INHALE 1 PUFF INTO THE LUNGS TWICE DAILY, Disp: 60 each, Rfl: 4   furosemide (LASIX) 40 MG tablet, TAKE 1 TABLET(40 MG) BY MOUTH DAILY AS NEEDED, Disp: 90 tablet, Rfl: 4   Glucosamine-Chondroitin (MOVE FREE PO), Take 1 tablet by mouth in the morning and at bedtime. After lunch & evening, Disp: , Rfl:    irbesartan (AVAPRO) 300 MG tablet, TAKE 1 TABLET(300 MG) BY MOUTH DAILY, Disp: 90 tablet, Rfl: 4   magnesium oxide (MAG-OX) 400 MG tablet, Take 400 mg by mouth 2 (two) times a day. After lunch & 1700, Disp: , Rfl:    mupirocin ointment (BACTROBAN) 2 %, Apply 1 application topically 2 (two) times daily., Disp: 22 g, Rfl: 2   nebivolol (BYSTOLIC) 5 MG tablet, Take 1 tablet (5 mg total) by mouth daily., Disp: 90 tablet, Rfl: 4   omeprazole (PRILOSEC) 20 MG capsule, TAKE 1 CAPSULE(20 MG) BY MOUTH DAILY, Disp: 90 capsule, Rfl: 4   QUERCETIN PO, Take 250 mg by mouth daily in the afternoon., Disp: , Rfl:    SPIRIVA RESPIMAT 1.25 MCG/ACT AERS, INHALE 2 PUFFS INTO THE LUNGS  DAILY, Disp: 4 g, Rfl: 3   traZODone (DESYREL) 50 MG tablet, TAKE 1 TABLET(50 MG) BY MOUTH AT BEDTIME AS NEEDED FOR SLEEP, Disp: 90 tablet, Rfl: 4   triamcinolone cream (KENALOG) 0.1 %, APPLY TOPICALLY TO THE AFFECTED AREA TWICE DAILY, Disp: 454 g, Rfl: 5   TURMERIC CURCUMIN PO, Take 1 tablet by mouth daily., Disp: , Rfl:    vitamin B-12 (CYANOCOBALAMIN) 500 MCG tablet, Take 500 mcg by mouth daily., Disp: , Rfl:    vitamin E 400 UNIT capsule, Take 400 Units by mouth daily., Disp: , Rfl:  Assessment & Plan:  UTI need to recheck UA.  pt is currently symptomatic for an Urinary tract infection(abd pain, burning etc), will cover with emperic abx, see med module for details.  encouraged to increase water/fluid intake.Signs and symptoms of emergency were discussed with the patient. The risks, benefits and side effects of treatment were discussed with the patient. The patient verbalized an understanding of plan, and was told to call the clinic/go to the ED if symptoms worsen at any point of time.   Problem List Items Addressed This Visit       Genitourinary   Urinary tract infection without hematuria - Primary   Relevant Medications   cephALEXin (KEFLEX) 500 MG capsule   Other Relevant Orders   Urine Culture   Urinalysis, Routine w reflex microscopic     Orders Placed This Encounter  Procedures   Urine Culture   Urinalysis, Routine w reflex microscopic     Meds ordered this encounter  Medications   cephALEXin (KEFLEX) 500 MG capsule    Sig: Take 1 capsule (500 mg total) by mouth 2 (two) times daily for 5 days.    Dispense:  10 capsule    Refill:  0     Follow up plan: No follow-ups on file.

## 2021-12-06 DIAGNOSIS — Z0100 Encounter for examination of eyes and vision without abnormal findings: Secondary | ICD-10-CM | POA: Diagnosis not present

## 2021-12-06 DIAGNOSIS — H43811 Vitreous degeneration, right eye: Secondary | ICD-10-CM | POA: Diagnosis not present

## 2021-12-25 ENCOUNTER — Other Ambulatory Visit: Payer: Self-pay | Admitting: Nurse Practitioner

## 2021-12-25 NOTE — Telephone Encounter (Signed)
Requested medication (s) are due for refill today: yes  Requested medication (s) are on the active medication list: yes  Last refill:  10/17/21 454 g with 5 RF  Future visit scheduled: no, just seen 10/17/21  Notes to clinic:  This medication can not be delegated, please assess.        Requested Prescriptions  Pending Prescriptions Disp Refills   triamcinolone cream (KENALOG) 0.1 % [Pharmacy Med Name: TRIAMCINOLONE 0.1% CREAM 454GM] 454 g 5    Sig: APPLY TOPICALLY TO THE AFFECTED AREA TWICE DAILY     Not Delegated - Dermatology:  Corticosteroids Failed - 12/25/2021 12:57 PM      Failed - This refill cannot be delegated      Passed - Valid encounter within last 12 months    Recent Outpatient Visits           2 months ago Urinary tract infection without hematuria, site unspecified   [facility] Vigg, Avanti, MD   3 months ago Primary osteoarthritis of both knees   [facility] [place], [place] T, NP   3 months ago Cellulitis of left lower extremity   [facility] Gabriel Cirri, NP   4 months ago BMI 50.0-59.9, adult (HCC)   [facility] Cannady, [name] T, NP   5 months ago Mild episode of recurrent major depressive disorder (HCC)   [facility] Cannady, [place] T, NP              Signed Prescriptions Disp Refills   irbesartan (AVAPRO) 300 MG tablet 90 tablet 1    Sig: TAKE 1 TABLET(300 MG) BY MOUTH DAILY     Cardiovascular:  Angiotensin Receptor Blockers Passed - 12/25/2021 12:57 PM      Passed - Cr in normal range and within 180 days    Creatinine, Ser  Date Value Ref Range Status  07/09/2021 0.87 0.57 - 1.00 mg/dL Final         Passed - K in normal range and within 180 days    Potassium  Date Value Ref Range Status  07/09/2021 4.5 3.5 - 5.2 mmol/L Final         Passed - Patient is not pregnant      Passed - Last BP in normal range    BP Readings from Last 1 Encounters:  09/24/21 137/68          Passed - Valid encounter within last 6 months    Recent Outpatient Visits           2 months ago Urinary tract infection without hematuria, site unspecified   [facility] Vigg, Avanti, MD   3 months ago Primary osteoarthritis of both knees   [facility] [place], [place] T, NP   3 months ago Cellulitis of left lower extremity   [facility] Gabriel Cirri, NP   4 months ago BMI 50.0-59.9, adult (HCC)   [facility] Cannady, [name] T, NP   5 months ago Mild episode of recurrent major depressive disorder (HCC)   [facility] [place], Dorie Rank, NP

## 2021-12-25 NOTE — Telephone Encounter (Signed)
Requested Prescriptions  Pending Prescriptions Disp Refills  . triamcinolone cream (KENALOG) 0.1 % [Pharmacy Med Name: TRIAMCINOLONE 0.1% CREAM 454GM] 454 g 5    Sig: APPLY TOPICALLY TO THE AFFECTED AREA TWICE DAILY     Not Delegated - Dermatology:  Corticosteroids Failed - 12/25/2021 12:57 PM      Failed - This refill cannot be delegated      Passed - Valid encounter within last 12 months    Recent Outpatient Visits          2 months ago Urinary tract infection without hematuria, site unspecified   [facility] Vigg, Avanti, MD   3 months ago Primary osteoarthritis of both knees   [facility] [place], [place] T, NP   3 months ago Cellulitis of left lower extremity   [facility] Gabriel Cirri, NP   4 months ago BMI 50.0-59.9, adult (HCC)   [facility] Cannady, [name] T, NP   5 months ago Mild episode of recurrent major depressive disorder (HCC)   [facility] Cannady, [name] T, NP             . irbesartan (AVAPRO) 300 MG tablet [Pharmacy Med Name: IRBESARTAN 300MG  TABLETS] 90 tablet 1    Sig: TAKE 1 TABLET(300 MG) BY MOUTH DAILY     Cardiovascular:  Angiotensin Receptor Blockers Passed - 12/25/2021 12:57 PM      Passed - Cr in normal range and within 180 days    Creatinine, Ser  Date Value Ref Range Status  07/09/2021 0.87 0.57 - 1.00 mg/dL Final         Passed - K in normal range and within 180 days    Potassium  Date Value Ref Range Status  07/09/2021 4.5 3.5 - 5.2 mmol/L Final         Passed - Patient is not pregnant      Passed - Last BP in normal range    BP Readings from Last 1 Encounters:  09/24/21 137/68         Passed - Valid encounter within last 6 months    Recent Outpatient Visits          2 months ago Urinary tract infection without hematuria, site unspecified   [facility] Vigg, Avanti, MD   3 months ago Primary osteoarthritis of both knees   [facility]  [place], [place] T, NP   3 months ago Cellulitis of left lower extremity   [facility] GRUMS, NP   4 months ago BMI 50.0-59.9, adult (HCC)   [facility] Cannady, [name] T, NP   5 months ago Mild episode of recurrent major depressive disorder (HCC)   [facility] [place], [place], NP

## 2022-01-12 ENCOUNTER — Other Ambulatory Visit: Payer: Self-pay | Admitting: Nurse Practitioner

## 2022-01-14 NOTE — Telephone Encounter (Signed)
Requested Prescriptions  Pending Prescriptions Disp Refills  . buPROPion (WELLBUTRIN SR) 150 MG 12 hr tablet [Pharmacy Med Name: BUPROPION SR 150MG  TABLETS (12 H)] 180 tablet 1    Sig: TAKE 1 TABLET(150 MG) BY MOUTH TWICE DAILY     Psychiatry: Antidepressants - bupropion Passed - 01/12/2022  7:07 AM      Passed - Cr in normal range and within 360 days    Creatinine, Ser  Date Value Ref Range Status  07/09/2021 0.87 0.57 - 1.00 mg/dL Final         Passed - AST in normal range and within 360 days    AST  Date Value Ref Range Status  07/09/2021 18 0 - 40 IU/L Final         Passed - ALT in normal range and within 360 days    ALT  Date Value Ref Range Status  07/09/2021 13 0 - 32 IU/L Final         Passed - Completed PHQ-2 or PHQ-9 in the last 360 days      Passed - Last BP in normal range    BP Readings from Last 1 Encounters:  09/24/21 137/68         Passed - Valid encounter within last 6 months    Recent Outpatient Visits          2 months ago Urinary tract infection without hematuria, site unspecified   [facility] Vigg, Avanti, MD   3 months ago Primary osteoarthritis of both knees   [facility] [place], [place] T, NP   4 months ago Cellulitis of left lower extremity   [facility] GRUMS, NP   4 months ago BMI 50.0-59.9, adult (HCC)   [facility] Cannady, [name] T, NP   6 months ago Mild episode of recurrent major depressive disorder (HCC)   [facility] [place], [place], NP

## 2022-02-07 DIAGNOSIS — L300 Nummular dermatitis: Secondary | ICD-10-CM | POA: Diagnosis not present

## 2022-02-07 DIAGNOSIS — D1801 Hemangioma of skin and subcutaneous tissue: Secondary | ICD-10-CM | POA: Diagnosis not present

## 2022-02-07 DIAGNOSIS — L821 Other seborrheic keratosis: Secondary | ICD-10-CM | POA: Diagnosis not present

## 2022-02-07 DIAGNOSIS — L570 Actinic keratosis: Secondary | ICD-10-CM | POA: Diagnosis not present

## 2022-03-18 ENCOUNTER — Encounter (INDEPENDENT_AMBULATORY_CARE_PROVIDER_SITE_OTHER): Payer: Self-pay

## 2022-03-19 ENCOUNTER — Telehealth (INDEPENDENT_AMBULATORY_CARE_PROVIDER_SITE_OTHER): Payer: Medicare PPO | Admitting: Nurse Practitioner

## 2022-03-19 ENCOUNTER — Encounter: Payer: Self-pay | Admitting: Nurse Practitioner

## 2022-03-19 VITALS — HR 65

## 2022-03-19 DIAGNOSIS — I10 Essential (primary) hypertension: Secondary | ICD-10-CM

## 2022-03-19 DIAGNOSIS — I890 Lymphedema, not elsewhere classified: Secondary | ICD-10-CM | POA: Diagnosis not present

## 2022-03-19 DIAGNOSIS — M170 Bilateral primary osteoarthritis of knee: Secondary | ICD-10-CM | POA: Diagnosis not present

## 2022-03-19 DIAGNOSIS — G4733 Obstructive sleep apnea (adult) (pediatric): Secondary | ICD-10-CM

## 2022-03-19 DIAGNOSIS — E782 Mixed hyperlipidemia: Secondary | ICD-10-CM

## 2022-03-19 DIAGNOSIS — F3340 Major depressive disorder, recurrent, in remission, unspecified: Secondary | ICD-10-CM | POA: Diagnosis not present

## 2022-03-19 MED ORDER — BUPROPION HCL ER (SR) 150 MG PO TB12
ORAL_TABLET | ORAL | 4 refills | Status: DC
Start: 2022-03-19 — End: 2022-03-19

## 2022-03-19 MED ORDER — BUPROPION HCL ER (XL) 300 MG PO TB24: 300.0000 mg | ORAL_TABLET | Freq: Every day | ORAL | 4 refills | Status: DC

## 2022-03-19 MED ORDER — KETOCONAZOLE 2 % EX CREA: 1.0000 | TOPICAL_CREAM | Freq: Every day | CUTANEOUS | 1 refills | Status: AC

## 2022-03-19 NOTE — Assessment & Plan Note (Signed)
Chronic, ongoing.  Continue current medication regimen and adjust as needed.  Lipid panel next visit.    

## 2022-03-19 NOTE — Progress Notes (Signed)
Pulse 65   LMP  (LMP Unknown)   SpO2 99% Comment: room air   Subjective:    Patient ID: [name], female    DOB: [birth year], 81 y.o.   MRN: [id number]  HPI: [name] is a 81 y.o. female  Chief Complaint  Patient presents with   Medication Management    Patient would like to discuss some medications and questions she has.    PT Treatment    Patient says PT called her to get her set up to start PT, but it was during the time she had a UTI and could not get set up and would discuss with provider at today's visit.    This visit was completed via video visit through MyChart due to the restrictions of the COVID-19 pandemic. All issues as above were discussed and addressed. Physical exam was done as above through visual confirmation on video through MyChart. If it was felt that the patient should be evaluated in the office, they were directed there. The patient verbally consented to this visit. Location of the patient: home Location of the provider: work Those involved with this call:  Provider: Aura Dials, DNP CMA: Malen Gauze, CMA Front Desk/Registration: [employer]  Time spent on call:  21 minutes with patient face to face via video conference. More than 50% of this time was spent in counseling and coordination of care. 15 minutes total spent in review of patient's record and preparation of their chart.  I verified patient identity using two factors (patient name and date of birth). Patient consents verbally to being seen via telemedicine visit today.    HYPERTENSION / HYPERLIPIDEMIA Continues on Furosemide, ASA, Avapro, Bystolic, and Atorvastatin. Follows with cardiology and last visit was 11/29/20 -- no recent visits.     Has chronic lymphedema and was going for massages -- would like referral to [facility] for a lymphedema specialist.  Taking Tylenol twice a day.  Uses compression pumps at home.  Has seen vascular in past, last 06/07/19.  Had a fall two weeks ago, having  ongoing right shoulder pain since.  She would like a new referral to physical therapy as missed last.   Satisfied with current treatment? yes Duration of hypertension: chronic BP monitoring frequency: not in last month BP range: 120-130/80 -- occasional 150 on top number BP medication side effects: no Duration of hyperlipidemia: chronic Cholesterol medication side effects: no Cholesterol supplements: none Medication compliance: good compliance Aspirin: no Recent stressors: no Recurrent headaches: no Visual changes: no Palpitations: no Dyspnea: occasional at baseline Chest pain: no Lower extremity edema: baseline Dizzy/lightheaded: no    COPD Continues Wixela & Spiriva and Albuterol as needed.    Uses CPAP 100% at home. Last saw Dr. Belia Heman with pulmonary - 10/15/2018, no return since.   COPD status: stable Satisfied with current treatment?: yes Oxygen use: no Dyspnea frequency: occasional Cough frequency: stable Rescue inhaler frequency: occasional Limitation of activity: no Productive cough: none Last Spirometry: unknown Pneumovax: Up to Date Influenza: Up to Date   DEPRESSION Continues on Wellbutrin and Trazodone 50 MG at bedtime as needed.  Has not used Trazodone in a long time. Mood status: stable Satisfied with current treatment?: yes Symptom severity: mild  Duration of current treatment : chronic Side effects: no Medication compliance: good compliance Psychotherapy/counseling: none Depressed mood: no Anxious mood: no Anhedonia: no Significant weight loss or gain: no Insomnia: yes hard to fall asleep Fatigue: no Feelings of worthlessness or guilt: no Impaired  concentration/indecisiveness: no Suicidal ideations: no Hopelessness: no Crying spells: no    03/19/2022    9:37 AM 09/04/2021    8:32 AM 06/04/2021   12:09 PM 05/24/2021   10:08 AM 10/17/2020    3:46 PM  Depression screen PHQ 2/9  Decreased Interest 0 0 0 0 0  Down, Depressed, Hopeless 0 0 0 0 0   PHQ - 2 Score 0 0 0 0 0  Altered sleeping 0 0 0  1  Tired, decreased energy 3 1 2  3   Change in appetite 0 0 0  0  Feeling bad or failure about yourself  0 0 0  0  Trouble concentrating 0 0 0  0  Moving slowly or fidgety/restless 0 0 0  0  Suicidal thoughts 0 0 0  0  PHQ-9 Score 3 1 2  4   Difficult doing work/chores Not difficult at all Not difficult at all Somewhat difficult  Not difficult at all       03/19/2022   10:12 AM 07/09/2021    4:36 PM 10/17/2020    3:48 PM 03/09/2018    9:24 AM  GAD 7 : Generalized Anxiety Score  Nervous, Anxious, on Edge 0 0 1 0  Control/stop worrying 0 0 3 1  Worry too much - different things 0 0 3 1  Trouble relaxing 0 0 0 0  Restless 0 0 0 0  Easily annoyed or irritable 0 0 0 0  Afraid - awful might happen 0 0 0 0  Total GAD 7 Score 0 0 7 2  Anxiety Difficulty Not difficult at all Not difficult at all Not difficult at all    Relevant past medical, surgical, family and social history reviewed and updated as indicated. Interim medical history since our last visit reviewed. Allergies and medications reviewed and updated.  Review of Systems  Constitutional:  Negative for activity change, appetite change, chills, fatigue and fever.  HENT: Negative.    Eyes:  Negative for pain and visual disturbance.  Respiratory:  Negative for cough, chest tightness, shortness of breath and wheezing.   Cardiovascular:  Negative for chest pain, palpitations and leg swelling.  Gastrointestinal: Negative.   Neurological:  Negative for dizziness, numbness and headaches.  Psychiatric/Behavioral: Negative.      Per HPI unless specifically indicated above     Objective:    Pulse 65   LMP  (LMP Unknown)   SpO2 99% Comment: room air  Wt Readings from Last 3 Encounters:  06/04/21 (!) 340 lb (154.2 kg)  11/29/20 (!) 340 lb (154.2 kg)  07/12/20 (!) 320 lb (145.2 kg)    Physical Exam Vitals and nursing note reviewed.  Constitutional:      General: She is awake.  She is not in acute distress.    Appearance: She is well-developed and well-groomed. She is obese. She is not ill-appearing or toxic-appearing.  HENT:     Head: Normocephalic.     Right Ear: Hearing normal.     Left Ear: Hearing normal.  Eyes:     General: Lids are normal.        Right eye: No discharge.        Left eye: No discharge.     Conjunctiva/sclera: Conjunctivae normal.  Pulmonary:     Effort: Pulmonary effort is normal. No accessory muscle usage or respiratory distress.  Musculoskeletal:     Cervical back: Normal range of motion.  Neurological:     Mental Status: She is alert and  oriented to person, place, and time.  Psychiatric:        Attention and Perception: Attention normal.        Mood and Affect: Mood normal.        Behavior: Behavior normal. Behavior is cooperative.        Thought Content: Thought content normal.        Judgment: Judgment normal.    Results for orders placed or performed in visit on 09/24/21  Urine Culture   Specimen: Urine   UR  Result Value Ref Range   Urine Culture, Routine Final report (A)    Organism ID, Bacteria Proteus mirabilis (A)    Antimicrobial Susceptibility Comment   Microscopic Examination   Urine  Result Value Ref Range   WBC, UA 11-30 (A) 0 - 5 /hpf   RBC, Urine 0-2 0 - 2 /hpf   Epithelial Cells (non renal) 0-10 0 - 10 /hpf   Crystals Present (A) N/A   Crystal Type Triple Phosphate N/A   Mucus, UA Present (A) Not Estab.   Bacteria, UA Many (A) None seen/Few  Urinalysis, Routine w reflex microscopic  Result Value Ref Range   Specific Gravity, UA 1.020 1.005 - 1.030   pH, UA 7.5 5.0 - 7.5   Color, UA Yellow Yellow   Appearance Ur Cloudy (A) Clear   Leukocytes,UA 2+ (A) Negative   Protein,UA 1+ (A) Negative/Trace   Glucose, UA Negative Negative   Ketones, UA Negative Negative   RBC, UA Negative Negative   Bilirubin, UA Negative Negative   Urobilinogen, Ur 0.2 0.2 - 1.0 mg/dL   Nitrite, UA Positive (A) Negative    Microscopic Examination See below:       Assessment & Plan:   Problem List Items Addressed This Visit       Cardiovascular and Mediastinum   Essential hypertension, benign    Chronic, ongoing with BP at goal recent visits, some elevations at home occasionally. Continue current medication regimen and adjust as needed + collaboration with cardiology -- recommend she return for follow-up.  Recommend she monitor BP at least a few mornings a week at home and document.  DASH diet at home.  Labs: obtain next visit.  Refills as needed.         Respiratory   OSA (obstructive sleep apnea)    Chronic, stable.  Continue 100% use of CPAP at night.        Musculoskeletal and Integument   Osteoarthritis of both knees    Chronic, ongoing. Referral placed to Home health PT as requested.      Relevant Orders   Ambulatory referral to Home Health     Other   Depression - Primary    Chronic, stable.  Will stop twice day Wellbutrin and change to Wellbutrin XL 300 MG to minimize medication burden, tolerates well.  Denies SI/HI.  Refills sent as needed.      Relevant Medications   buPROPion (WELLBUTRIN XL) 300 MG 24 hr tablet   Hyperlipidemia    Chronic, ongoing.  Continue current medication regimen and adjust as needed.  Lipid panel next visit.      Lymphedema    Chronic, ongoing.  Recommend use of Tylenol as needed for pain, max 3000 MG total daily dosing.  Avoid Ibuprofen or NSAID products.  Continue using compression wraps daily and massage therapy.  Will have her return to vascular -- she wishes to go to [facility], referral placed.  Referral place for PT home health.  Relevant Orders   Ambulatory referral to Vascular Surgery   Ambulatory referral to Home Health   Morbid obesity (HCC)    BMI 59.28 with underlying HTN and ASTHMA.  Recommended eating smaller high protein, low fat meals more frequently and exercising 30 mins a day 5 times a week with a goal of 10-15lb weight loss in the next  3 months. Patient voiced their understanding and motivation to adhere to these recommendations.        I discussed the assessment and treatment plan with the patient. The patient was provided an opportunity to ask questions and all were answered. The patient agreed with the plan and demonstrated an understanding of the instructions.   The patient was advised to call back or seek an in-person evaluation if the symptoms worsen or if the condition fails to improve as anticipated.   I provided 21+ minutes of time during this encounter.    Follow up plan: Return in about 4 months (around 07/10/2022) for Depression, Anxiety, HTN/HLD, COPD, OSA.

## 2022-03-19 NOTE — Progress Notes (Signed)
Pt scheduled  

## 2022-03-19 NOTE — Assessment & Plan Note (Signed)
Chronic, ongoing.  Recommend use of Tylenol as needed for pain, max 3000 MG total daily dosing.  Avoid Ibuprofen or NSAID products.  Continue using compression wraps daily and massage therapy.  Will have her return to vascular -- she wishes to go to [facility], referral placed.  Referral place for PT home health.

## 2022-03-19 NOTE — Assessment & Plan Note (Signed)
Chronic, stable.  Will stop twice day Wellbutrin and change to Wellbutrin XL 300 MG to minimize medication burden, tolerates well.  Denies SI/HI.  Refills sent as needed.

## 2022-03-19 NOTE — Assessment & Plan Note (Addendum)
Chronic, ongoing with BP at goal recent visits, some elevations at home occasionally. Continue current medication regimen and adjust as needed + collaboration with cardiology -- recommend she return for follow-up.  Recommend she monitor BP at least a few mornings a week at home and document.  DASH diet at home.  Labs: obtain next visit.  Refills as needed.

## 2022-03-19 NOTE — Assessment & Plan Note (Signed)
BMI 59.28 with underlying HTN and ASTHMA.  Recommended eating smaller high protein, low fat meals more frequently and exercising 30 mins a day 5 times a week with a goal of 10-15lb weight loss in the next 3 months. Patient voiced their understanding and motivation to adhere to these recommendations.

## 2022-03-19 NOTE — Assessment & Plan Note (Signed)
Chronic, ongoing. Referral placed to Home health PT as requested.

## 2022-03-19 NOTE — Patient Instructions (Signed)
Major Depressive Disorder, Adult Major depressive disorder (MDD) is a mental health condition. People with this disorder feel very sad, hopeless, and lose interest in things. Symptoms last most of the day, almost every day, for 2 weeks. MDD can affect: Relationships. Work and school. Things you usually like to do. What are the causes? The cause of MDD is not known. What increases the risk? Having family members with depression. Being female. Family problems. Alcohol or drug misuse. A lot of stress in your life, such as from: Living without basic needs such as food and housing. Being treated poorly because of race, sex, or religion (discrimination). Things that caused you pain as a child, especially if you lost a parent or were abused. Health and mental problems that you have had for a long time. What are the signs or symptoms? The main symptoms of this condition are: Being sad all the time. Being grouchy (irritable) all the time. Not enjoying the things you usually like. Sleeping too much or too little. Eating too much or too little. Feeling tired. Other symptoms include: Gaining or losing weight, without knowing why. Being restless and weak. Feeling hopeless, worthless, or guilty. Trouble thinking or making decisions. Thoughts of hurting yourself or others, or thoughts of ending your life. Spending a lot of time alone. Being unable to do daily tasks. If you have very bad MDD, you may: Believe things that are not true. Hear, see, taste, or feel things that are not there. Have mild depression that lasts for at least 2 years. Feel very sad and hopeless. Have trouble speaking or moving. Feel very sad during some seasons. How is this treated? Talk therapy. This teaches you about thoughts, feelings, and actions and how to change them. This can also help you to talk with others. This can be done with members of your family. Medicines. Lifestyle changes. You may need to: Limit  alcohol use. Stop using drugs, if you use them. Exercise. Get plenty of sleep. Eat healthy. Spend more time outdoors. Brain stimulation. This may be done when symptoms are very bad or have not gotten better. Follow these instructions at home: Alcohol use Do not drink alcohol if: Your health care provider tells you not to drink. You are pregnant, may be pregnant, or are planning to become pregnant. If you drink alcohol: Limit how much you use to: 0-1 drink a day for women. 0-2 drinks a day for men. Know how much alcohol is in your drink. In the [place] one drink equals one 12 oz bottle of beer (355 mL), one 5 oz glass of wine (148 mL), or one 1 oz glass of hard liquor (44 mL). Activity Exercise as told by your doctor. Spend time outdoors. Make time to do the things you enjoy. Find ways to deal with stress. Try to: Meditate. Do deep breathing. Spend time in nature. Keep a journal. Return to your normal activities when your doctor says that it is safe. General instructions  Take over-the-counter and prescription medicines only as told by your doctor. Talk to your doctor about: Alcohol use. It can affect medicines. Any drug use. Eat healthy foods. Get a lot of sleep. Think about joining a support group. Ask your doctor about that. Keep all follow-up visits. Your doctor will need to check on your mood, behavior, and medicines, and change your treatment as needed. Where to find more information: National Alliance on Mental Illness: nami.org National Institute of Mental Health: nimh.nih.gov American Psychiatric Association: psychiatry.org Contact a doctor   if: You feel worse. You get new symptoms. Get help right away if: You hurt yourself on purpose (self-harm). You have thoughts about hurting yourself or others. You see, hear, taste, smell, or feel things that are not there. Get help right away if you feel like you may hurt yourself or others, or have thoughts about taking  your own life. Go to your nearest emergency room or: Call 911. Call the National Suicide Prevention Lifeline at 1-800-273-8255 or 988. This is open 24 hours a day. Text the Crisis Text Line at 741741. This information is not intended to replace advice given to you by your health care provider. Make sure you discuss any questions you have with your health care provider. Document Revised: 09/11/2021 Document Reviewed: 09/11/2021 Elsevier Patient Education  2023 Elsevier Inc.  

## 2022-03-19 NOTE — Assessment & Plan Note (Signed)
Chronic, stable.  Continue 100% use of CPAP at night. 

## 2022-03-22 ENCOUNTER — Telehealth: Payer: Self-pay | Admitting: Nurse Practitioner

## 2022-03-22 NOTE — Telephone Encounter (Signed)
Stephanie from [facility] called in states patient moved appt for OT Evaluation from this week to next week, 11/06

## 2022-03-23 NOTE — Telephone Encounter (Signed)
Noted  

## 2022-03-25 ENCOUNTER — Telehealth: Payer: Self-pay | Admitting: Nurse Practitioner

## 2022-03-25 NOTE — Telephone Encounter (Signed)
Copied from [place] [id number]. Topic: Quick Communication - Home Health Verbal Orders >> Mar 25, 2022  3:09 PM Everette C wrote: Caller/Agency: Leory Plowman  Callback Number: [phone] Requesting OT/PT/Skilled Nursing/Social Work/Speech Therapy: OT Frequency: 1w6

## 2022-03-26 NOTE — Telephone Encounter (Signed)
Spoke with Colletta Maryland and provided her with verbal OK for patient's orders. Colletta Maryland verbalized understanding and has no further questions at this time.

## 2022-04-24 ENCOUNTER — Other Ambulatory Visit: Payer: Self-pay | Admitting: Nurse Practitioner

## 2022-04-24 NOTE — Telephone Encounter (Signed)
Requested Prescriptions  Pending Prescriptions Disp Refills   omeprazole (PRILOSEC) 20 MG capsule [Pharmacy Med Name: OMEPRAZOLE 20MG  CAPSULES] 90 capsule 4    Sig: TAKE 1 CAPSULE(20 MG) BY MOUTH DAILY     Gastroenterology: Proton Pump Inhibitors Passed - 04/24/2022 12:36 PM      Passed - Valid encounter within last 12 months    Recent Outpatient Visits           1 month ago Recurrent major depressive disorder, in remission (HCC)   [facility] Cannady, [name] T, NP   6 months ago Urinary tract infection without hematuria, site unspecified   [facility] Vigg, Avanti, MD   7 months ago Primary osteoarthritis of both knees   [facility] [place], Eldridge T, NP   7 months ago Cellulitis of left lower extremity   [facility], NP   8 months ago BMI 50.0-59.9, adult (HCC)   [facility] [place], [place], NP       Future Appointments             In 2 months Cannady, Dorie Rank, NP Dorie Rank, [facility]

## 2022-05-16 ENCOUNTER — Other Ambulatory Visit: Payer: Self-pay | Admitting: Nurse Practitioner

## 2022-05-17 NOTE — Telephone Encounter (Signed)
Requested Prescriptions  Pending Prescriptions Disp Refills   irbesartan (AVAPRO) 300 MG tablet [Pharmacy Med Name: IRBESARTAN 300MG  TABLETS] 90 tablet 0    Sig: TAKE 1 TABLET(300 MG) BY MOUTH DAILY     Cardiovascular:  Angiotensin Receptor Blockers Failed - 05/16/2022  8:09 AM      Failed - Cr in normal range and within 180 days    Creatinine, Ser  Date Value Ref Range Status  07/09/2021 0.87 0.57 - 1.00 mg/dL Final         Failed - K in normal range and within 180 days    Potassium  Date Value Ref Range Status  07/09/2021 4.5 3.5 - 5.2 mmol/L Final         Passed - Patient is not pregnant      Passed - Last BP in normal range    BP Readings from Last 1 Encounters:  09/24/21 137/68         Passed - Valid encounter within last 6 months    Recent Outpatient Visits           1 month ago Recurrent major depressive disorder, in remission (HCC)   [facility] Cannady, [name] T, NP   7 months ago Urinary tract infection without hematuria, site unspecified   [facility] Vigg, Avanti, MD   7 months ago Primary osteoarthritis of both knees   [facility] [place], [place] T, NP   8 months ago Cellulitis of left lower extremity   [facility], NP   9 months ago BMI 50.0-59.9, adult (HCC)   [facility] [place], [place], NP       Future Appointments             In 1 month Cannady, Dorie Rank, NP Dorie Rank, [facility]

## 2022-06-25 ENCOUNTER — Other Ambulatory Visit: Payer: Self-pay | Admitting: Nurse Practitioner

## 2022-06-26 NOTE — Telephone Encounter (Signed)
Rx written for 15 months- will fill- expired 05/23/21 Requested Prescriptions  Pending Prescriptions Disp Refills   nebivolol (BYSTOLIC) 5 MG tablet [Pharmacy Med Name: NEBIVOLOL 5MG  TABLETS] 90 tablet 4    Sig: TAKE 1 TABLET(5 MG) BY MOUTH DAILY     Cardiovascular: Beta Blockers 3 Passed - 06/25/2022 10:11 AM      Passed - Cr in normal range and within 360 days    Creatinine, Ser  Date Value Ref Range Status  07/09/2021 0.87 0.57 - 1.00 mg/dL Final         Passed - AST in normal range and within 360 days    AST  Date Value Ref Range Status  07/09/2021 18 0 - 40 IU/L Final         Passed - ALT in normal range and within 360 days    ALT  Date Value Ref Range Status  07/09/2021 13 0 - 32 IU/L Final         Passed - Last BP in normal range    BP Readings from Last 1 Encounters:  09/24/21 137/68         Passed - Last Heart Rate in normal range    Pulse Readings from Last 1 Encounters:  03/19/22 65         Passed - Valid encounter within last 6 months    Recent Outpatient Visits           3 months ago Recurrent major depressive disorder, in remission ([place])   [place], [place] T, NP   8 months ago Urinary tract infection without hematuria, site unspecified   [place] Vigg, Avanti, MD   9 months ago Primary osteoarthritis of both knees   [place] Deputy, [place] T, NP   9 months ago Cellulitis of left lower extremity   [place] Kathrine Haddock, NP   10 months ago BMI 50.0-59.9, adult [facility])   [place], Barbaraann Faster, NP       Future Appointments             In 2 weeks Cannady, Barbaraann Faster, NP [place], [facility]

## 2022-07-04 ENCOUNTER — Telehealth (INDEPENDENT_AMBULATORY_CARE_PROVIDER_SITE_OTHER): Payer: Medicare PPO | Admitting: Nurse Practitioner

## 2022-07-04 ENCOUNTER — Encounter: Payer: Self-pay | Admitting: Nurse Practitioner

## 2022-07-04 VITALS — BP 135/62 | HR 63

## 2022-07-04 DIAGNOSIS — G4733 Obstructive sleep apnea (adult) (pediatric): Secondary | ICD-10-CM | POA: Diagnosis not present

## 2022-07-04 DIAGNOSIS — F32A Depression, unspecified: Secondary | ICD-10-CM | POA: Diagnosis not present

## 2022-07-04 DIAGNOSIS — I10 Essential (primary) hypertension: Secondary | ICD-10-CM

## 2022-07-04 DIAGNOSIS — J4540 Moderate persistent asthma, uncomplicated: Secondary | ICD-10-CM

## 2022-07-04 DIAGNOSIS — I872 Venous insufficiency (chronic) (peripheral): Secondary | ICD-10-CM

## 2022-07-04 DIAGNOSIS — I890 Lymphedema, not elsewhere classified: Secondary | ICD-10-CM

## 2022-07-04 DIAGNOSIS — F3340 Major depressive disorder, recurrent, in remission, unspecified: Secondary | ICD-10-CM

## 2022-07-04 DIAGNOSIS — Z6841 Body Mass Index (BMI) 40.0 and over, adult: Secondary | ICD-10-CM

## 2022-07-04 DIAGNOSIS — I517 Cardiomegaly: Secondary | ICD-10-CM | POA: Diagnosis not present

## 2022-07-04 DIAGNOSIS — E785 Hyperlipidemia, unspecified: Secondary | ICD-10-CM

## 2022-07-04 DIAGNOSIS — E782 Mixed hyperlipidemia: Secondary | ICD-10-CM

## 2022-07-04 DIAGNOSIS — I700 Atherosclerosis of aorta: Secondary | ICD-10-CM | POA: Diagnosis not present

## 2022-07-04 MED ORDER — ROSUVASTATIN CALCIUM 20 MG PO TABS: 20.0000 mg | ORAL_TABLET | Freq: Every day | ORAL | 4 refills | Status: DC

## 2022-07-04 MED ORDER — DOXYCYCLINE HYCLATE 100 MG PO TABS: 100.0000 mg | ORAL_TABLET | Freq: Two times a day (BID) | ORAL | 0 refills | Status: DC

## 2022-07-04 NOTE — Patient Instructions (Signed)
Cellulitis, Adult  Cellulitis is a skin infection. The infected area is often warm, red, swollen, and sore. It occurs most often in the arms and lower legs. It is very important to get treated for this condition. What are the causes? This condition is caused by bacteria. The bacteria enter through a break in the skin, such as a cut, burn, insect bite, open sore, or crack. What increases the risk? This condition is more likely to occur in people who: Have a weak body defense system (immune system). Have open cuts, burns, bites, or scrapes on the skin. Are older than 82 years of age. Have a blood sugar problem (diabetes). Have a long-lasting (chronic) liver disease (cirrhosis) or kidney disease. Are very overweight (obese). Have a skin problem, such as: Itchy rash (eczema). Slow movement of blood in the veins (venous stasis). Fluid buildup below the skin (edema). Have been treated with high-energy rays (radiation). Use IV drugs. What are the signs or symptoms? Symptoms of this condition include: Skin that is: Red. Streaking. Spotting. Swollen. Sore or painful when you touch it. Warm. A fever. Chills. Blisters. How is this diagnosed? This condition is diagnosed based on: Medical history. Physical exam. Blood tests. Imaging tests. How is this treated? Treatment for this condition may include: Medicines to treat infections or allergies. Home care, such as: Rest. Placing cold or warm cloths (compresses) on the skin. Hospital care, if the condition is very bad. Follow these instructions at home: Medicines Take over-the-counter and prescription medicines only as told by your doctor. If you were prescribed an antibiotic medicine, take it as told by your doctor. Do not stop taking it even if you start to feel better. General instructions  Drink enough fluid to keep your pee (urine) pale yellow. Do not touch or rub the infected area. Raise (elevate) the infected area above  the level of your heart while you are sitting or lying down. Place cold or warm cloths on the area as told by your doctor. Keep all follow-up visits as told by your doctor. This is important. Contact a doctor if: You have a fever. You do not start to get better after 1-2 days of treatment. Your bone or joint under the infected area starts to hurt after the skin has healed. Your infection comes back. This can happen in the same area or another area. You have a swollen bump in the area. You have new symptoms. You feel ill and have muscle aches and pains. Get help right away if: Your symptoms get worse. You feel very sleepy. You throw up (vomit) or have watery poop (diarrhea) for a long time. You see red streaks coming from the area. Your red area gets larger. Your red area turns dark in color. These symptoms may represent a serious problem that is an emergency. Do not wait to see if the symptoms will go away. Get medical help right away. Call your local emergency services (911 in the [place] Do not drive yourself to the hospital. Summary Cellulitis is a skin infection. The area is often warm, red, swollen, and sore. This condition is treated with medicines, rest, and cold and warm cloths. Take all medicines only as told by your doctor. Tell your doctor if symptoms do not start to get better after 1-2 days of treatment. This information is not intended to replace advice given to you by your health care provider. Make sure you discuss any questions you have with your health care provider. Document Revised: 02/14/2021 Document  Reviewed: 02/15/2021 Elsevier Patient Education  2023 [employer]  

## 2022-07-04 NOTE — Assessment & Plan Note (Signed)
Chronic.  Noted on CT scan 09/10/2017.  Recommend continue ASA and statin daily for prevention.

## 2022-07-04 NOTE — Assessment & Plan Note (Signed)
Chronic, ongoing.  Will change to Rosuvastatin which may have less side effects.  Discussed with patient.

## 2022-07-04 NOTE — Assessment & Plan Note (Signed)
Chronic, stable.  Denies SI/HI.  Continue current medication regimen and adjust as needed.

## 2022-07-04 NOTE — Assessment & Plan Note (Addendum)
Chronic, ongoing.  Recommend use of Tylenol as needed for pain, max 3000 MG total daily dosing.  Avoid Ibuprofen or NSAID products.  Continue using compression wraps daily and massage therapy.  Will send in Doxycycline for current cellulitis present.

## 2022-07-04 NOTE — Assessment & Plan Note (Signed)
Chronic, stable.  Continue 100% use of CPAP at night.

## 2022-07-04 NOTE — Assessment & Plan Note (Signed)
Chronic, stable.  Continue current medication regimen and adjust as needed.  May benefit from return to pulmonary in upcoming months, will further discuss next visit.

## 2022-07-04 NOTE — Assessment & Plan Note (Signed)
BMI 59.28 last visit with underlying HTN and ASTHMA.  Recommended eating smaller high protein, low fat meals more frequently and exercising 30 mins a day 5 times a week with a goal of 10-15lb weight loss in the next 3 months. Patient voiced their understanding and motivation to adhere to these recommendations.

## 2022-07-04 NOTE — Progress Notes (Signed)
BP 135/62   Pulse 63   LMP  (LMP Unknown)    Subjective:    Patient ID: [name], female    DOB: July 10, 1940, 82 y.o.   MRN: [id number]  HPI: [name] is a 82 y.o. female  Chief Complaint  Patient presents with   Cellulitis   Chills    Has been having chills about 15 min to 1 hr after she eats.   COPD   Depression   Sleep Apnea   Bladder issue    Last week she noticed that when she urinated that her bladder did not seem to empty all the way. Did research on internet and found that if she stopped her statin it would stop so she stopped taking the statin and is now back to normal per patient.   This visit was completed via video visit through MyChart due to the restrictions of the COVID-19 pandemic. All issues as above were discussed and addressed. Physical exam was done as above through visual confirmation on video through MyChart. If it was felt that the patient should be evaluated in the office, they were directed there. The patient verbally consented to this visit. Location of the patient: home Location of the provider: work Those involved with this call:  Provider: [name], DNP CMA: Jillyn Hidden Front Desk/Registration: Alejandro Mulling  Time spent on call:  21 minutes with patient face to face via video conference. More than 50% of this time was spent in counseling and coordination of care. 15 minutes total spent in review of patient's record and preparation of their chart.  I verified patient identity using two factors (patient name and date of birth). Patient consents verbally to being seen via telemedicine visit today.    HYPERTENSION / HYPERLIPIDEMIA Continues on Furosemide, ASA, Avapro, Bystolic. Follows with cardiology and last visit was 11/29/20 -- no recent visits, is thinking about returning for visit.  She has stopped Atorvastatin as was having issues with urination where she could not empty fully -- she read online that this can be side effect.     Has  chronic lymphedema and was going for massages -- has not seen massage therapist since August as the provider's husband is very sick. Taking Tylenol twice a day.  Uses compression pumps at home.  Has seen vascular in past, last 06/07/19, has not returned.  She feels she is having some cellulitis issues at this time to right side -- feels heat to area and redness.  Has been treated in past for this, Doxycycline offers benefit. Satisfied with current treatment? yes Duration of hypertension: chronic BP monitoring frequency: not in last month BP range: 120-130/80 BP medication side effects: no Duration of hyperlipidemia: chronic Cholesterol medication side effects: no Cholesterol supplements: none Medication compliance: good compliance Aspirin: no Recent stressors: no Recurrent headaches: no Visual changes: no Palpitations: no Dyspnea: occasional at baseline Chest pain: no Lower extremity edema: baseline Dizzy/lightheaded: occasional  COPD Continues Wixela & Spiriva and Albuterol as needed.  Uses Tessalon for cough as needed.  Is not currently taking Zyrtec.  She is noticing that she has chills about 15 minutes to 1 hours after she eats.  It is often, but not every time.  Chills last less then 5 minutes.    Uses CPAP 100% at home. Last saw Dr. Mortimer Fries with pulmonary - 10/15/2018, has not returned since that time. COPD status: stable Satisfied with current treatment?: yes Oxygen use: no Dyspnea frequency: occasional Cough frequency: no  Rescue inhaler frequency: once ever other week Limitation of activity: no Productive cough: none Last Spirometry: with pulmonary Pneumovax: Up to Date Influenza: Up to Date   DEPRESSION Continues on Wellbutrin and Trazodone 50 MG at bedtime as needed. Does not take Trazodone very often. Mood status: stable Satisfied with current treatment?: yes Symptom severity: mild  Duration of current treatment : chronic Side effects: no Medication compliance: good  compliance Psychotherapy/counseling: none Depressed mood: no Anxious mood: no Anhedonia: no Significant weight loss or gain: no Insomnia: yes hard to fall asleep Fatigue: no Feelings of worthlessness or guilt: no Impaired concentration/indecisiveness: no Suicidal ideations: no Hopelessness: no Crying spells: no    07/04/2022   10:38 AM 03/19/2022    9:37 AM 09/04/2021    8:32 AM 06/04/2021   12:09 PM 05/24/2021   10:08 AM  Depression screen PHQ 2/9  Decreased Interest 0 0 0 0 0  Down, Depressed, Hopeless 0 0 0 0 0  PHQ - 2 Score 0 0 0 0 0  Altered sleeping 0 0 0 0   Tired, decreased energy 2 3 1 2   $ Change in appetite 0 0 0 0   Feeling bad or failure about yourself  0 0 0 0   Trouble concentrating 0 0 0 0   Moving slowly or fidgety/restless 0 0 0 0   Suicidal thoughts 0 0 0 0   PHQ-9 Score 2 3 1 2   $ Difficult doing work/chores Not difficult at all Not difficult at all Not difficult at all Somewhat difficult        07/04/2022   10:39 AM 03/19/2022   10:12 AM 07/09/2021    4:36 PM 10/17/2020    3:48 PM  GAD 7 : Generalized Anxiety Score  Nervous, Anxious, on Edge 0 0 0 1  Control/stop worrying 1 0 0 3  Worry too much - different things 3 0 0 3  Trouble relaxing 0 0 0 0  Restless 0 0 0 0  Easily annoyed or irritable 0 0 0 0  Afraid - awful might happen 0 0 0 0  Total GAD 7 Score 4 0 0 7  Anxiety Difficulty Not difficult at all Not difficult at all Not difficult at all Not difficult at all   Relevant past medical, surgical, family and social history reviewed and updated as indicated. Interim medical history since our last visit reviewed. Allergies and medications reviewed and updated.  Review of Systems  Constitutional:  Negative for activity change, appetite change, chills, fatigue and fever.  HENT: Negative.    Eyes:  Negative for pain and visual disturbance.  Respiratory:  Negative for cough, chest tightness, shortness of breath and wheezing.   Cardiovascular:   Negative for chest pain, palpitations and leg swelling.  Gastrointestinal: Negative.   Neurological:  Negative for dizziness, numbness and headaches.  Psychiatric/Behavioral: Negative.     Per HPI unless specifically indicated above     Objective:    BP 135/62   Pulse 63   LMP  (LMP Unknown)   Wt Readings from Last 3 Encounters:  06/04/21 (!) 340 lb (154.2 kg)  11/29/20 (!) 340 lb (154.2 kg)  07/12/20 (!) 320 lb (145.2 kg)    Physical Exam Vitals and nursing note reviewed.  Constitutional:      General: She is awake. She is not in acute distress.    Appearance: She is well-developed and well-groomed. She is obese. She is not ill-appearing or toxic-appearing.  HENT:  Head: Normocephalic.     Right Ear: Hearing normal.     Left Ear: Hearing normal.  Eyes:     General: Lids are normal.        Right eye: No discharge.        Left eye: No discharge.     Conjunctiva/sclera: Conjunctivae normal.  Pulmonary:     Effort: Pulmonary effort is normal. No accessory muscle usage or respiratory distress.  Musculoskeletal:     Cervical back: Normal range of motion.  Neurological:     Mental Status: She is alert and oriented to person, place, and time.  Psychiatric:        Attention and Perception: Attention normal.        Mood and Affect: Mood normal.        Behavior: Behavior normal. Behavior is cooperative.        Thought Content: Thought content normal.        Judgment: Judgment normal.    Results for orders placed or performed in visit on 09/24/21  Urine Culture   Specimen: Urine   UR  Result Value Ref Range   Urine Culture, Routine Final report (A)    Organism ID, Bacteria Proteus mirabilis (A)    Antimicrobial Susceptibility Comment   Microscopic Examination   Urine  Result Value Ref Range   WBC, UA 11-30 (A) 0 - 5 /hpf   RBC, Urine 0-2 0 - 2 /hpf   Epithelial Cells (non renal) 0-10 0 - 10 /hpf   Crystals Present (A) N/A   Crystal Type Triple Phosphate N/A    Mucus, UA Present (A) Not Estab.   Bacteria, UA Many (A) None seen/Few  Urinalysis, Routine w reflex microscopic  Result Value Ref Range   Specific Gravity, UA 1.020 1.005 - 1.030   pH, UA 7.5 5.0 - 7.5   Color, UA Yellow Yellow   Appearance Ur Cloudy (A) Clear   Leukocytes,UA 2+ (A) Negative   Protein,UA 1+ (A) Negative/Trace   Glucose, UA Negative Negative   Ketones, UA Negative Negative   RBC, UA Negative Negative   Bilirubin, UA Negative Negative   Urobilinogen, Ur 0.2 0.2 - 1.0 mg/dL   Nitrite, UA Positive (A) Negative   Microscopic Examination See below:       Assessment & Plan:   Problem List Items Addressed This Visit       Cardiovascular and Mediastinum   Aortic atherosclerosis (HCC)    Chronic.  Noted on CT scan 09/10/2017.  Recommend continue ASA and statin daily for prevention.      Relevant Medications   rosuvastatin (CRESTOR) 20 MG tablet   Cardiomegaly    Chronic, recommend she schedule follow-up with vascular.  Continue current BP regimen.      Relevant Medications   rosuvastatin (CRESTOR) 20 MG tablet   Chronic venous insufficiency    Chronic, ongoing.  Recommend use of Tylenol as needed for pain, max 3000 MG total daily dosing.  Continue using compression wraps daily and massage therapy.  Will have her return to vascular as needed.      Relevant Medications   rosuvastatin (CRESTOR) 20 MG tablet   Essential hypertension, benign - Primary    Chronic, stable with BP at goal on home readings. Continue current medication regimen and adjust as needed + collaboration with cardiology -- recommend she return for follow-up.  Recommend she monitor BP at least a few mornings a week at home and document.  DASH diet at home.  Labs: obtain next visit.  Refills as needed.       Relevant Medications   rosuvastatin (CRESTOR) 20 MG tablet     Respiratory   Asthma    Chronic, stable.  Continue current medication regimen and adjust as needed.  May benefit from return  to pulmonary in upcoming months, will further discuss next visit.      OSA (obstructive sleep apnea)    Chronic, stable.  Continue 100% use of CPAP at night.        Other   BMI 50.0-59.9, adult [facility])    Refer to morbid obesity plan of care.      Depression    Chronic, stable.  Denies SI/HI.  Continue current medication regimen and adjust as needed.      Hyperlipidemia    Chronic, ongoing.  Will change to Rosuvastatin which may have less side effects.  Discussed with patient.      Relevant Medications   rosuvastatin (CRESTOR) 20 MG tablet   Lymphedema    Chronic, ongoing.  Recommend use of Tylenol as needed for pain, max 3000 MG total daily dosing.  Avoid Ibuprofen or NSAID products.  Continue using compression wraps daily and massage therapy.  Will send in Doxycycline for current cellulitis present.      Morbid obesity ([place])    BMI 59.28 last visit with underlying HTN and ASTHMA.  Recommended eating smaller high protein, low fat meals more frequently and exercising 30 mins a day 5 times a week with a goal of 10-15lb weight loss in the next 3 months. Patient voiced their understanding and motivation to adhere to these recommendations.        I discussed the assessment and treatment plan with the patient. The patient was provided an opportunity to ask questions and all were answered. The patient agreed with the plan and demonstrated an understanding of the instructions.   The patient was advised to call back or seek an in-person evaluation if the symptoms worsen or if the condition fails to improve as anticipated.   I provided 21+ minutes of time during this encounter.    Follow up plan: Return in about 18 days (around 07/22/2022) for HTN/HLD, MEMORY, MOOD.

## 2022-07-04 NOTE — Assessment & Plan Note (Signed)
Chronic, stable with BP at goal on home readings. Continue current medication regimen and adjust as needed + collaboration with cardiology -- recommend she return for follow-up.  Recommend she monitor BP at least a few mornings a week at home and document.  DASH diet at home.  Labs: obtain next visit.  Refills as needed.

## 2022-07-04 NOTE — Assessment & Plan Note (Signed)
Chronic, recommend she schedule follow-up with vascular.  Continue current BP regimen.

## 2022-07-04 NOTE — Assessment & Plan Note (Signed)
Refer to morbid obesity plan of care.

## 2022-07-04 NOTE — Assessment & Plan Note (Signed)
Chronic, ongoing.  Recommend use of Tylenol as needed for pain, max 3000 MG total daily dosing.  Continue using compression wraps daily and massage therapy.  Will have her return to vascular as needed.

## 2022-07-05 NOTE — Progress Notes (Signed)
Appt. has been made for March 8 at 1:20 Left message for patient to call if this appt. time does not work.

## 2022-07-10 ENCOUNTER — Ambulatory Visit: Payer: Self-pay | Admitting: *Deleted

## 2022-07-10 NOTE — Telephone Encounter (Signed)
  Chief Complaint: cough requesting medication Symptoms: hx COPD. Coughing spells, productive at times yellow sputum. Wheezing and uses inhaler with relief. O 2 sat at 97% RA.  Frequency: last night  Pertinent Negatives: Patient denies chest pain no difficulty breathing no fever Disposition: []$ ED /[]$ Urgent Care (no appt availability in office) / [x]$ Appointment(In office/virtual)/ []$  [facility]/ []$ Home Care/ []$ Refused Recommended Disposition /[]$ [facility]/ []$  Follow-up with PCP Additional Notes:   Recommended to do at home test for covid to R/O. Appt My Chart VV scheduled for tomorrow. Patient reports she is unable to come in due to mobility issues. Requesting if medication can be called in today and she would still want to keep appt. Please advise.    Reason for Disposition  [1] Known COPD or other severe lung disease (i.e., bronchiectasis, cystic fibrosis, lung surgery) AND [2] worsening symptoms (i.e., increased sputum purulence or amount, increased breathing difficulty  Answer Assessment - Initial Assessment Questions 1. ONSET: "When did the cough begin?"      Last night  2. SEVERITY: "How bad is the cough today?"      Coughing spells - often  3. SPUTUM: "Describe the color of your sputum" (none, dry cough; clear, white, yellow, green)     Yellow  4. HEMOPTYSIS: "Are you coughing up any blood?" If so ask: "How much?" (flecks, streaks, tablespoons, etc.)     na 5. DIFFICULTY BREATHING: "Are you having difficulty breathing?" If Yes, ask: "How bad is it?" (e.g., mild, moderate, severe)    - MILD: No SOB at rest, mild SOB with walking, speaks normally in sentences, can lie down, no retractions, pulse < 100.    - MODERATE: SOB at rest, SOB with minimal exertion and prefers to sit, cannot lie down flat, speaks in phrases, mild retractions, audible wheezing, pulse 100-120.    - SEVERE: Very SOB at rest, speaks in single words, struggling to breathe, sitting hunched  forward, retractions, pulse > 120      Coughing throughout the night  denies SOB.  6. FEVER: "Do you have a fever?" If Yes, ask: "What is your temperature, how was it measured, and when did it start?"     na 7. CARDIAC HISTORY: "Do you have any history of heart disease?" (e.g., heart attack, congestive heart failure)      na 8. LUNG HISTORY: "Do you have any history of lung disease?"  (e.g., pulmonary embolus, asthma, emphysema)     Hx COPD  9. PE RISK FACTORS: "Do you have a history of blood clots?" (or: recent major surgery, recent prolonged travel, bedridden)     na 10. OTHER SYMPTOMS: "Do you have any other symptoms?" (e.g., runny nose, wheezing, chest pain)       Wheezing and using inhaler cough congestion O2 sat at 97% RA 11. PREGNANCY: "Is there any chance you are pregnant?" "When was your last menstrual period?"       na 12. TRAVEL: "Have you traveled out of the country in the last month?" (e.g., travel history, exposures)       na  Protocols used: Cough - Acute Productive-A-AH

## 2022-07-10 NOTE — Telephone Encounter (Signed)
Patient made aware of Provider's recommendations and verbalized understanding.

## 2022-07-11 ENCOUNTER — Telehealth (INDEPENDENT_AMBULATORY_CARE_PROVIDER_SITE_OTHER): Payer: Medicare PPO | Admitting: Nurse Practitioner

## 2022-07-11 ENCOUNTER — Encounter: Payer: Self-pay | Admitting: Nurse Practitioner

## 2022-07-11 DIAGNOSIS — J4541 Moderate persistent asthma with (acute) exacerbation: Secondary | ICD-10-CM | POA: Diagnosis not present

## 2022-07-11 MED ORDER — DOXYCYCLINE HYCLATE 100 MG PO TABS: 100.0000 mg | ORAL_TABLET | Freq: Two times a day (BID) | ORAL | 0 refills | Status: AC

## 2022-07-11 MED ORDER — HYDROCOD POLI-CHLORPHE POLI ER 10-8 MG/5ML PO SUER: 5.0000 mL | Freq: Every evening | ORAL | 0 refills | Status: DC | PRN

## 2022-07-11 MED ORDER — PREDNISONE 20 MG PO TABS: 40.0000 mg | ORAL_TABLET | Freq: Every day | ORAL | 0 refills | Status: AC

## 2022-07-11 NOTE — Assessment & Plan Note (Signed)
Acute exacerbation at this time.  Covid negative at home.  Will extend her current Doxycycline for 4 more days and send in Prednisone 40 MG daily for 5 days + Tussionex.  Continue inhaler regimen at home and if worsening needs in person visit for lung check.  Recommend: Your symptoms and exam findings are most consistent with a viral upper respiratory infection. - Increased rest - Increasing Fluids - Acetaminophen / ibuprofen as needed for fever/pain.  - Salt water gargling, chloraseptic spray and throat lozenges - Mucinex.  - Humidifying the air.

## 2022-07-11 NOTE — Progress Notes (Signed)
LMP  (LMP Unknown)   SpO2 96%    Subjective:    Patient ID: [name], female    DOB: 04-17-1941, 82 y.o.   MRN: [id number]  HPI: [name] is a 82 y.o. female  Chief Complaint  Patient presents with   Cough    Started on Monday has been taking Emerg C and Mucinex.which as been helping. Patient is also taking ABX Doxy since last Thursday for cellulitis   COPD   This visit was completed via telephone due to the restrictions of the COVID-19 pandemic. All issues as above were discussed and addressed but no physical exam was performed. If it was felt that the patient should be evaluated in the office, they were directed there. The patient verbally consented to this visit. Patient was unable to complete an audio/visual visit due to Technical difficulties", "Lack of internet. Due to the catastrophic nature of the COVID-19 pandemic, this visit was done through audio contact only. Location of the patient: home Location of the provider: home Those involved with this call:  Provider: [name], DNP CMA: Frazier Butt, CMA Front Desk/Registration: [employer]  Time spent on call:  21 minutes on the phone discussing health concerns. 15 minutes total spent in review of patient's record and preparation of their chart.  I verified patient identity using two factors (patient name and date of birth). Patient consents verbally to being seen via telemedicine visit today.    UPPER RESPIRATORY TRACT INFECTION Has been very sick, that started over weekend. Developed headache and started coughing.  After started Doxycycline started to notice phlegm production.  Could not sleep at night due to coughing.  Tested negative for Covid at home.  Has underlying asthma, is using her nebulizer and inhalers. Fever: no Cough: yes Shortness of breath: no Wheezing: yes Chest pain: no Chest tightness: yes Chest congestion: yes Nasal congestion: yes Runny nose: no Post nasal drip: no Sneezing: no Sore  throat: yes Swollen glands: no Sinus pressure: yes Headache:yes, not today Face pain: no Toothache: no Ear pain: yes bilateral off and on Ear pressure: yes bilateral Eyes red/itching:no Eye drainage/crusting: no  Vomiting: no Rash: no Fatigue: yes Sick contacts: yes Strep contacts: no  Context: fluctuating Recurrent sinusitis: no Relief with OTC cold/cough medications: yes  Treatments attempted: inhalers and Emerge C    Relevant past medical, surgical, family and social history reviewed and updated as indicated. Interim medical history since our last visit reviewed. Allergies and medications reviewed and updated.  Review of Systems  Constitutional:  Positive for fatigue. Negative for activity change, appetite change, chills and fever.  HENT:  Positive for congestion, ear pain, sinus pressure, sinus pain and sore throat. Negative for ear discharge, facial swelling, postnasal drip, rhinorrhea, sneezing and voice change.   Respiratory:  Positive for cough, chest tightness and wheezing. Negative for shortness of breath.   Cardiovascular:  Negative for chest pain, palpitations and leg swelling.  Gastrointestinal: Negative.   Endocrine: Negative.   Musculoskeletal:  Negative for myalgias.  Neurological:  Positive for headaches. Negative for dizziness and numbness.  Psychiatric/Behavioral: Negative.      Per HPI unless specifically indicated above     Objective:    LMP  (LMP Unknown)   SpO2 96%   Wt Readings from Last 3 Encounters:  06/04/21 (!) 340 lb (154.2 kg)  11/29/20 (!) 340 lb (154.2 kg)  07/12/20 (!) 320 lb (145.2 kg)    Physical Exam  Unable to perform due  to technical issues this morning with patient.  Results for orders placed or performed in visit on 09/24/21  Urine Culture   Specimen: Urine   UR  Result Value Ref Range   Urine Culture, Routine Final report (A)    Organism ID, Bacteria Proteus mirabilis (A)    Antimicrobial Susceptibility Comment    Microscopic Examination   Urine  Result Value Ref Range   WBC, UA 11-30 (A) 0 - 5 /hpf   RBC, Urine 0-2 0 - 2 /hpf   Epithelial Cells (non renal) 0-10 0 - 10 /hpf   Crystals Present (A) N/A   Crystal Type Triple Phosphate N/A   Mucus, UA Present (A) Not Estab.   Bacteria, UA Many (A) None seen/Few  Urinalysis, Routine w reflex microscopic  Result Value Ref Range   Specific Gravity, UA 1.020 1.005 - 1.030   pH, UA 7.5 5.0 - 7.5   Color, UA Yellow Yellow   Appearance Ur Cloudy (A) Clear   Leukocytes,UA 2+ (A) Negative   Protein,UA 1+ (A) Negative/Trace   Glucose, UA Negative Negative   Ketones, UA Negative Negative   RBC, UA Negative Negative   Bilirubin, UA Negative Negative   Urobilinogen, Ur 0.2 0.2 - 1.0 mg/dL   Nitrite, UA Positive (A) Negative   Microscopic Examination See below:       Assessment & Plan:   Problem List Items Addressed This Visit       Respiratory   Asthma exacerbation - Primary    Acute exacerbation at this time.  Covid negative at home.  Will extend her current Doxycycline for 4 more days and send in Prednisone 40 MG daily for 5 days + Tussionex.  Continue inhaler regimen at home and if worsening needs in person visit for lung check.  Recommend: Your symptoms and exam findings are most consistent with a viral upper respiratory infection. - Increased rest - Increasing Fluids - Acetaminophen / ibuprofen as needed for fever/pain.  - Salt water gargling, chloraseptic spray and throat lozenges - Mucinex.  - Humidifying the air.       Relevant Medications   predniSONE (DELTASONE) 20 MG tablet    I discussed the assessment and treatment plan with the patient. The patient was provided an opportunity to ask questions and all were answered. The patient agreed with the plan and demonstrated an understanding of the instructions.   The patient was advised to call back or seek an in-person evaluation if the symptoms worsen or if the condition fails to  improve as anticipated.   I provided 21+ minutes of time during this encounter.    Follow up plan: Return if symptoms worsen or fail to improve.

## 2022-07-11 NOTE — Patient Instructions (Signed)
Asthma Triggers and Exacerbation In this video, you will learn about exposures and other factors that can lead to asthma attacks and trouble controlling asthma. To view the content, go to this web address: [email or web address]  This video will expire on: 01/23/2024. If you need access to this video following this date, please reach out to the healthcare provider who assigned it to you. This information is not intended to replace advice given to you by your health care provider. Make sure you discuss any questions you have with your health care provider. Elsevier Patient Education  [place].

## 2022-07-12 ENCOUNTER — Ambulatory Visit: Payer: Medicare PPO | Admitting: Nurse Practitioner

## 2022-07-15 ENCOUNTER — Ambulatory Visit (INDEPENDENT_AMBULATORY_CARE_PROVIDER_SITE_OTHER): Payer: Medicare PPO

## 2022-07-15 VITALS — Wt 340.0 lb

## 2022-07-15 DIAGNOSIS — Z0000 Encounter for general adult medical examination without abnormal findings: Secondary | ICD-10-CM

## 2022-07-15 NOTE — Patient Instructions (Signed)
[name] , Thank you for taking time to come for your Medicare Wellness Visit. I appreciate your ongoing commitment to your health goals. Please review the following plan we discussed and let me know if I can assist you in the future.   These are the goals we discussed:  Goals       "I have a problem with my pulse dropping and I get dizzy" (pt-stated)      Current Barriers:  Hx bradycardia, HTN, multiple medications changes made to attempt to control BP without dropping HR Patient is currently taking irbesartan 150 mg QAM, nebivolol 2.5 mg at noon, irbesartan 150 mg in the afternoon, and nebivolol 2.5 mg at bedtime. Most stable BP/HR in a while  Reports that she is particularly concerned with her HR dropping, as she was with her husband when he had a heart attack and it dropped to the 30s Per chart review, no specific indication for beta blocker therapy Last cardiology visit with Dr. Rockey [name] on 08/12/2016, he noted that she "likely doesn't need beta blocker therapy"  Pharmacist Clinical Goal(s):  Over the next 90 days, patient will work with PharmD and PCP to address needs related to optimal hypertension management  Interventions: Congratulated patient on maintenance of goal BP/HR with current regimen. Encouraged to continue checking, and to try to not worry as much about lower HR  Patient Self Care Activities:  Self administers medications as prescribed Calls pharmacy for medication refills  Monitor blood pressure 2-3 times weekly and will monitor pulse  Please see past updates related to this goal by clicking on the "Past Updates" button in the selected goal        "I take a lot of medications and I'm not sure I'm using them correctly" (pt-stated)      Current Barriers:  Knowledge Deficits related to appropriate medication management   Patient previously taking diphenhydramine every evening to help with leg itching/sleep; she then tried melatonin, but had visual hallucinations At last  primary care provider appointment, trazodone 25 mg QHS PRN sleep was initiated. Patient notes that she took it every night when she was out of town, and continues to take fairly regularly. She feels it has been extremely beneficial, and she notes she is "much less tearful". She wonders if she can take it every night.  Pharmacist Clinical Goal(s):   Over the next 90 days, patient will work with PCP and CCM team to improve appropriate use of safe and effective medications  Interventions: Discussed with patient that daily low dose trazodone is safe, if she is finding it beneficial. Will notify primary care provider [name] to determine how/when she would like to follow up with patient about her mood   Patient Self Care Activities:  Does not self-administer prescription medications exactly as prescribed   Please see past updates related to this goal by clicking on the "Past Updates" button in the selected goal        "My lyphedema is bad, my legs are itchy and swollen" (pt-stated)      Current Barriers:  Patient reports "leg muscle pain" that has gotten worse due to not being about to receive massage therapy. Described as "sore and painful - like working out for hours" and "feels like someone is pulling on my veins." It wakes her up at night and she has to adjust positions in order to sleep.  Patient states that her pain has gotten better since she stopped the B-12 and is ultilizing her  furosemide more often   Pharmacist Clinical Goal(s):  Over the next 30 days, patient will work with PCP and CCM team to address needs related to management of lymphedema  Interventions: Encouraged continued use of furosemide as prescribed and to report her use to PCP at next visit to determine if lab work is warrented  Patient Self Care Activities:  Patient wears compression hose daily as directed Patient will continue to work with CCM RN [name] for strategies to reduce pain and improve lymphedema    Please see past updates related to this goal by clicking on the "Past Updates" button in the selected goal        DIET - EAT MORE FRUITS AND VEGETABLES      I need to take better care of myself (pt-stated)      Current Barriers:  Knowledge Deficits related to maintaining optimal health with lymphedema and hypertension Chronic Disease Management support and education needs related to weight management, lymphedema and hypertension  Nurse Case Manager Clinical Goal(s):  Over the next 90 days, patient will work with [name] to address needs related to Weight Management, Lymphedema, and Hypertension Over the next 90 days, the patient will demonstrate ongoing self health care management ability as evidenced by decreased weight, and increaseed adherence to self care activities*  Interventions:  Evaluation of current treatment plan related to hypertension medication regimen and patient's adherence to plan as established by provider. Provided education to patient re: Lymphedema management, weight management Reviewed medications with patient and discussed new added meds and med times Provided patient with verbal educational materials related to lymphedema Advised patient, providing education and rationale, to check b/p qday and record, calling PCP for findings outside established parameters.   Discussed weight management strategies and encouraged patient to begin using [name] which she stated she had lots of success with in the past. Encouraged patient to use her compression hose which she admitted to "getting slack on" Discussed recent feelings of depression related to the loss of her spouse of 9 years and how this has affected her health.   Patient Self Care Activities:  Currently UNABLE TO independently Manage weight, lymphedema, and hypertension effectively  Initial goal documentation       Increase water intake      Recommend drinking at least 6-8 glasses of water a day       Patient Stated      04/17/2020, remain as active as she can      Patient Stated      More mobility        SW-"I need additional support right now." (pt-stated)      Current Barriers:  Financial constraints ADL IADL limitations Mental Health Symptoms Social Isolation Lacks knowledge of community resource: grief support resources within the area  Clinical Social Work Clinical Goal(s):  Over the next 120 days, client will work with SW to address concerns related to lack of self-care and ongoing grief over spouse  Interventions: Patient interviewed and appropriate assessments performed Provided mental health counseling regarding loss of spouse. Patient denies wanting grief counseling referral at this time. States that she has a large support network and has recently retired. Patient reports spending a lot of time crafting and painting to help cope.  LCSW provided emotional support and reflective listening throughout entire session.  Provided patient with information about [facility] in case she changes her mind. Patient reports that her daughter just got her a nice lift chair that massages. She reports  that her family is extremely supportive. She moved out of her residence and into her daughter's residence after her spouse passed. The household includes: patient, daughter, son in law and their two children. Patient reports that her grandchild assist her with laundry. She shares that her son in law works from home and is able to assist her whenever needed. Patient reports that she is actively working on her self-care by socializing with friends and doing water paint. She reports that she has been paying extra attention to her body. She reports that she has been making a blessings list every night and will re-read them in the morning to provide her with a positive outlook. Positive reinforcement provided.  Patient shares that her energy levels have been low. Self-care education  provided.  Patient reports no recent falls.  LCSW provided patient with Home Care Providers contact number in the case that she wishes to place herself on the wait list for C.H.O[facility] services as she feels that she may eventually need an aide.  Discussed plans with patient for ongoing care management follow up and provided patient with direct contact information for care management team Advised patient to to contact CCM LCSW if any urgent concerns arise.  Assisted patient/caregiver with obtaining information about health plan benefits  Patient Self Care Activities:  Attends all scheduled provider appointments Calls provider office for new concerns or questions  Please see past updates related to this goal by clicking on the "Past Updates" button in the selected goal          This is a list of the screening recommended for you and due dates:  Health Maintenance  Topic Date Due   Zoster (Shingles) Vaccine (1 of 2) Never done   Pneumonia Vaccine (2 of 2 - PPSV23 or PCV20) 09/01/2014   Medicare Annual Wellness Visit  07/16/2023   DTaP/Tdap/Td vaccine (2 - Tdap) 09/01/2023   DEXA scan (bone density measurement)  Completed   HPV Vaccine  Aged Out   Flu Shot  Discontinued   COVID-19 Vaccine  Discontinued    Advanced directives: no  Conditions/risks identified: none  Next appointment: Follow up in one year for your annual wellness visit 07/21/23 @ 9:15 am by phone   Preventive Care 65 Years and Older, Female Preventive care refers to lifestyle choices and visits with your health care provider that can promote health and wellness. What does preventive care include? A yearly physical exam. This is also called an annual well check. Dental exams once or twice a year. Routine eye exams. Ask your health care provider how often you should have your eyes checked. Personal lifestyle choices, including: Daily care of your teeth and gums. Regular physical activity. Eating a healthy  diet. Avoiding tobacco and drug use. Limiting alcohol use. Practicing safe sex. Taking low-dose aspirin every day. Taking vitamin and mineral supplements as recommended by your health care provider. What happens during an annual well check? The services and screenings done by your health care provider during your annual well check will depend on your age, overall health, lifestyle risk factors, and family history of disease. Counseling  Your health care provider may ask you questions about your: Alcohol use. Tobacco use. Drug use. Emotional well-being. Home and relationship well-being. Sexual activity. Eating habits. History of falls. Memory and ability to understand (cognition). Work and work Statistician. Reproductive health. Screening  You may have the following tests or measurements: Height, weight, and BMI. Blood pressure. Lipid and cholesterol levels. These may be  checked every 5 years, or more frequently if you are over 50 years old. Skin check. Lung cancer screening. You may have this screening every year starting at age 55 if you have a 30-pack-year history of smoking and currently smoke or have quit within the past 15 years. Fecal occult blood test (FOBT) of the stool. You may have this test every year starting at age 48. Flexible sigmoidoscopy or colonoscopy. You may have a sigmoidoscopy every 5 years or a colonoscopy every 10 years starting at age 43. Hepatitis C blood test. Hepatitis B blood test. Sexually transmitted disease (STD) testing. Diabetes screening. This is done by checking your blood sugar (glucose) after you have not eaten for a while (fasting). You may have this done every 1-3 years. Bone density scan. This is done to screen for osteoporosis. You may have this done starting at age 51. Mammogram. This may be done every 1-2 years. Talk to your health care provider about how often you should have regular mammograms. Talk with your health care provider about  your test results, treatment options, and if necessary, the need for more tests. Vaccines  Your health care provider may recommend certain vaccines, such as: Influenza vaccine. This is recommended every year. Tetanus, diphtheria, and acellular pertussis (Tdap, Td) vaccine. You may need a Td booster every 10 years. Zoster vaccine. You may need this after age 29. Pneumococcal 13-valent conjugate (PCV13) vaccine. One dose is recommended after age 19. Pneumococcal polysaccharide (PPSV23) vaccine. One dose is recommended after age 72. Talk to your health care provider about which screenings and vaccines you need and how often you need them. This information is not intended to replace advice given to you by your health care provider. Make sure you discuss any questions you have with your health care provider. Document Released: 06/02/2015 Document Revised: 01/24/2016 Document Reviewed: 03/07/2015 Elsevier Interactive Patient Education  2017 [place] Prevention in the Home Falls can cause injuries. They can happen to people of all ages. There are many things you can do to make your home safe and to help prevent falls. What can I do on the outside of my home? Regularly fix the edges of walkways and driveways and fix any cracks. Remove anything that might make you trip as you walk through a door, such as a raised step or threshold. Trim any bushes or trees on the path to your home. Use bright outdoor lighting. Clear any walking paths of anything that might make someone trip, such as rocks or tools. Regularly check to see if handrails are loose or broken. Make sure that both sides of any steps have handrails. Any raised decks and porches should have guardrails on the edges. Have any leaves, snow, or ice cleared regularly. Use sand or salt on walking paths during winter. Clean up any spills in your garage right away. This includes oil or grease spills. What can I do in the bathroom? Use  night lights. Install grab bars by the toilet and in the tub and shower. Do not use towel bars as grab bars. Use non-skid mats or decals in the tub or shower. If you need to sit down in the shower, use a plastic, non-slip stool. Keep the floor dry. Clean up any water that spills on the floor as soon as it happens. Remove soap buildup in the tub or shower regularly. Attach bath mats securely with double-sided non-slip rug tape. Do not have throw rugs and other things on the floor that  can make you trip. What can I do in the bedroom? Use night lights. Make sure that you have a light by your bed that is easy to reach. Do not use any sheets or blankets that are too big for your bed. They should not hang down onto the floor. Have a firm chair that has side arms. You can use this for support while you get dressed. Do not have throw rugs and other things on the floor that can make you trip. What can I do in the kitchen? Clean up any spills right away. Avoid walking on wet floors. Keep items that you use a lot in easy-to-reach places. If you need to reach something above you, use a strong step stool that has a grab bar. Keep electrical cords out of the way. Do not use floor polish or wax that makes floors slippery. If you must use wax, use non-skid floor wax. Do not have throw rugs and other things on the floor that can make you trip. What can I do with my stairs? Do not leave any items on the stairs. Make sure that there are handrails on both sides of the stairs and use them. Fix handrails that are broken or loose. Make sure that handrails are as long as the stairways. Check any carpeting to make sure that it is firmly attached to the stairs. Fix any carpet that is loose or worn. Avoid having throw rugs at the top or bottom of the stairs. If you do have throw rugs, attach them to the floor with carpet tape. Make sure that you have a light switch at the top of the stairs and the bottom of the  stairs. If you do not have them, ask someone to add them for you. What else can I do to help prevent falls? Wear shoes that: Do not have high heels. Have rubber bottoms. Are comfortable and fit you well. Are closed at the toe. Do not wear sandals. If you use a stepladder: Make sure that it is fully opened. Do not climb a closed stepladder. Make sure that both sides of the stepladder are locked into place. Ask someone to hold it for you, if possible. Clearly mark and make sure that you can see: Any grab bars or handrails. First and last steps. Where the edge of each step is. Use tools that help you move around (mobility aids) if they are needed. These include: Canes. Walkers. Scooters. Crutches. Turn on the lights when you go into a dark area. Replace any light bulbs as soon as they burn out. Set up your furniture so you have a clear path. Avoid moving your furniture around. If any of your floors are uneven, fix them. If there are any pets around you, be aware of where they are. Review your medicines with your doctor. Some medicines can make you feel dizzy. This can increase your chance of falling. Ask your doctor what other things that you can do to help prevent falls. This information is not intended to replace advice given to you by your health care provider. Make sure you discuss any questions you have with your health care provider. Document Released: 03/02/2009 Document Revised: 10/12/2015 Document Reviewed: 06/10/2014 Elsevier Interactive Patient Education  2017 [employer].

## 2022-07-15 NOTE — Progress Notes (Signed)
I connected with  [name] on 07/15/22 by a audio enabled telemedicine application and verified that I am speaking with the correct person using two identifiers.  Patient Location: Home  Provider Location: Office/Clinic  I discussed the limitations of evaluation and management by telemedicine. The patient expressed understanding and agreed to proceed.  Subjective:   [name] is a 82 y.o. female who presents for Medicare Annual (Subsequent) preventive examination.  Review of Systems     Cardiac Risk Factors include: advanced age (>50mn, >>78women);hypertension;obesity (BMI >30kg/m2);sedentary lifestyle     Objective:    There were no vitals filed for this visit. There is no height or weight on file to calculate BMI.     07/15/2022    9:24 AM 05/24/2021   10:06 AM 07/27/2020    6:54 AM 04/17/2020    2:40 PM 04/12/2019    2:37 PM 01/14/2018    4:55 PM 01/01/2018    1:13 PM  Advanced Directives  Does Patient Have a Medical Advance Directive? No No No No No No No  Would patient like information on creating a medical advance directive? No - Patient declined No - Patient declined No - Patient declined    Yes (MAU/Ambulatory/Procedural Areas - Information given)    Current Medications (verified) Outpatient Encounter Medications as of 07/15/2022  Medication Sig   albuterol (PROVENTIL) (2.5 MG/3ML) 0.083% nebulizer solution Take 3 mLs (2.5 mg total) by nebulization every 6 (six) hours as needed for wheezing or shortness of breath.   albuterol (VENTOLIN HFA) 108 (90 Base) MCG/ACT inhaler Inhale 2 puffs into the lungs every 6 (six) hours as needed for wheezing or shortness of breath.   Apple Cider Vinegar 500 MG TABS Take 500 mg by mouth with breakfast, with lunch, and with evening meal.   Ascorbic Acid (VITAMIN C) 1000 MG tablet Take 1,000 mg by mouth daily.   aspirin EC 81 MG tablet Take 81 mg by mouth every evening. Swallow whole.   BALANCED B COMPLEX CR PO Take by mouth.    buPROPion (WELLBUTRIN XL) 300 MG 24 hr tablet Take 1 tablet (300 mg total) by mouth daily.   cetirizine (ZYRTEC) 10 MG tablet Take 10 mg by mouth daily.   chlorpheniramine-HYDROcodone (TUSSIONEX) 10-8 MG/5ML Take 5 mLs by mouth at bedtime as needed for cough.   Cholecalciferol (VITAMIN D3) 125 MCG (5000 UT) TABS Take 5,000 Units by mouth in the morning and at bedtime. Morning & afternoon   docusate sodium (COLACE) 100 MG capsule Take 100 mg by mouth 2 (two) times daily as needed (constipation).   doxycycline (VIBRA-TABS) 100 MG tablet Take 1 tablet (100 mg total) by mouth 2 (two) times daily for 4 days.   fluticasone-salmeterol (WIXELA INHUB) 250-50 MCG/ACT AEPB INHALE 1 PUFF INTO THE LUNGS TWICE DAILY   furosemide (LASIX) 40 MG tablet TAKE 1 TABLET(40 MG) BY MOUTH DAILY AS NEEDED   Glucosamine-Chondroitin (MOVE FREE PO) Take 1 tablet by mouth in the morning and at bedtime. After lunch & evening   irbesartan (AVAPRO) 300 MG tablet TAKE 1 TABLET(300 MG) BY MOUTH DAILY   ketoconazole (NIZORAL) 2 % cream Apply 1 Application topically daily.   magnesium oxide (MAG-OX) 400 MG tablet Take 400 mg by mouth 2 (two) times a day. After lunch & 1700   mupirocin ointment (BACTROBAN) 2 % Apply 1 application topically 2 (two) times daily.   nebivolol (BYSTOLIC) 5 MG tablet TAKE 1 TABLET(5 MG) BY MOUTH DAILY   omeprazole (  PRILOSEC) 20 MG capsule TAKE 1 CAPSULE(20 MG) BY MOUTH DAILY   predniSONE (DELTASONE) 20 MG tablet Take 2 tablets (40 mg total) by mouth daily with breakfast for 5 days.   QUERCETIN PO Take 250 mg by mouth daily in the afternoon.   rosuvastatin (CRESTOR) 20 MG tablet Take 1 tablet (20 mg total) by mouth daily.   SPIRIVA RESPIMAT 1.25 MCG/ACT AERS INHALE 2 PUFFS INTO THE LUNGS DAILY   traZODone (DESYREL) 50 MG tablet TAKE 1 TABLET(50 MG) BY MOUTH AT BEDTIME AS NEEDED FOR SLEEP   triamcinolone cream (KENALOG) 0.1 % APPLY TOPICALLY TO THE AFFECTED AREA TWICE DAILY   TURMERIC CURCUMIN PO Take 1  tablet by mouth daily.   vitamin E 400 UNIT capsule Take 400 Units by mouth daily.   vitamin B-12 (CYANOCOBALAMIN) 500 MCG tablet Take 500 mcg by mouth daily. (Patient not taking: Reported on 07/15/2022)   No facility-administered encounter medications on file as of 07/15/2022.    Allergies (verified) Benicar [olmesartan], Latex, Ace inhibitors, Codeine, Ciprofloxacin, Diovan [valsartan], Flagyl [metronidazole], Lotensin [benazepril hcl], Penicillins, and Sulfa antibiotics   History: Past Medical History:  Diagnosis Date   Allergy    Anemia    vitamin d deficiency   Arthritis    Asthma    Chronic fatigue syndrome    COPD (chronic obstructive pulmonary disease) ([place])    COVID 05/2020   Diverticulosis    Dyspnea    Fractured fibula    GERD (gastroesophageal reflux disease)    Gout    Headache    migraines   History of blood transfusion    Hypertension    Lymphedema 2004   Neuromuscular disorder ([place])    Pneumonia    Shingles    Sleep apnea    cpap   Past Surgical History:  Procedure Laterality Date   ABDOMINAL HYSTERECTOMY  1974   BIOPSY THYROID     CATARACT EXTRACTION W/PHACO Left 07/27/2020   Procedure: CATARACT EXTRACTION PHACO AND INTRAOCULAR LENS PLACEMENT ([place]) LEFT DIABETIC;  Surgeon: Eulogio Bear, MD;  Location: [facility] ORS;  Service: Ophthalmology;  Laterality: Left;  Lot [id number] H [place]; 01:04.8 CDE: 8.86   CHOLECYSTECTOMY  2005   Family History  Problem Relation Age of Onset   Heart disease Mother    Hyperlipidemia Mother    Diabetes Father    Alcoholism Father    Cancer Brother    Hypertension Son    Stroke Maternal Grandmother    Social History   Socioeconomic History   Marital status: Widowed    Spouse name: Not on file   Number of children: Not on file   Years of education: 14 years   Highest education level: Some college, no degree  Occupational History   Not on file  Tobacco Use   Smoking status: Never   Smokeless tobacco: Never   Vaping Use   Vaping Use: Never used  Substance and Sexual Activity   Alcohol use: No    Alcohol/week: 0.0 standard drinks of alcohol   Drug use: No   Sexual activity: Not Currently  Other Topics Concern   Not on file  Social History Narrative   Card making club.       Working full time at [employer] Strain: [place]  (07/15/2022)   Overall Financial Resource Strain (CARDIA)    Difficulty of Paying Living Expenses: Not hard at all  Food Insecurity: No Food Insecurity (  07/15/2022)   Hunger Vital Sign    Worried About Running Out of Food in the Last Year: Never true    [place] in the Last Year: Never true  Transportation Needs: No Transportation Needs (07/15/2022)   PRAPARE - Hydrologist (Medical): No    Lack of Transportation (Non-Medical): No  Physical Activity: Insufficiently Active (07/15/2022)   Exercise Vital Sign    Days of Exercise per Week: 4 days    Minutes of Exercise per Session: 20 min  Stress: No Stress Concern Present (07/15/2022)   [place]    Feeling of Stress : Not at all  Social Connections: Socially Isolated (07/15/2022)   Social Connection and Isolation Panel [NHANES]    Frequency of Communication with Friends and Family: More than three times a week    Frequency of Social Gatherings with Friends and Family: More than three times a week    Attends Religious Services: Never    Marine scientist or Organizations: No    Attends Archivist Meetings: Never    Marital Status: Widowed    Tobacco Counseling Counseling given: Not Answered   Clinical Intake:  Pre-visit preparation completed: Yes        Nutritional Risks: None Diabetes: No  How often do you need to have someone help you when you read instructions, pamphlets, or other written materials from your doctor  or pharmacy?: 1 - Never  Diabetic?no  Interpreter Needed?: No  Information entered by :: Kirke Shaggy, LPN   Activities of Daily Living    07/15/2022    9:24 AM  In your present state of health, do you have any difficulty performing the following activities:  Hearing? 0  Vision? 0  Difficulty concentrating or making decisions? 0  Walking or climbing stairs? 1  Dressing or bathing? 0  Doing errands, shopping? 0  Preparing Food and eating ? N  Using the Toilet? N  In the past six months, have you accidently leaked urine? N  Do you have problems with loss of bowel control? N  Managing your Medications? N  Managing your Finances? N  Housekeeping or managing your Housekeeping? N    Patient Care Team: Venita Lick, NP as PCP - General (Nurse Practitioner) Flora Lipps, MD as Consulting Physician (Pulmonary Disease) Minna Merritts, MD as Consulting Physician (Cardiology) Greg Cutter, LCSW as Social Worker (Licensed Clinical Social Worker) Minor, Dalbert Garnet, RN (Inactive) as Case Manager  Indicate any recent Medical Services you may have received from other than [facility] providers in the past year (date may be approximate).     Assessment:   This is a routine wellness examination for Alawna.  Hearing/Vision screen Hearing Screening - Comments:: No aids Vision Screening - Comments:: Wears glasses- [facility]  Dietary issues and exercise activities discussed: Current Exercise Habits: Home exercise routine, Type of exercise: stretching, Time (Minutes): 20, Frequency (Times/Week): 4, Weekly Exercise (Minutes/Week): 80, Intensity: Mild   Goals Addressed             This Visit's Progress    DIET - EAT MORE FRUITS AND VEGETABLES         Depression Screen    07/15/2022    9:22 AM 07/04/2022   10:38 AM 03/19/2022    9:37 AM 09/04/2021    8:32 AM 06/04/2021   12:09 PM 05/24/2021   10:08 AM 10/17/2020    3:46  PM  PHQ 2/9 Scores  PHQ - 2 Score 0 0 0 0 0 0 0  PHQ- 9  Score 0 '2 3 1 2  4    '$ Fall Risk    07/15/2022    9:24 AM 07/04/2022   10:38 AM 05/24/2021    9:54 AM 04/17/2020    2:41 PM 04/12/2019    2:25 PM  Fall Risk   Falls in the past year? 0 0 0 0 1  Number falls in past yr: 0 0 0  1  Injury with Fall? 0 0 0  1  Risk for fall due to : No Fall Risks No Fall Risks  Impaired mobility;Impaired balance/gait;Medication side effect History of fall(s);Impaired mobility  Risk for fall due to: Comment   power chair transferred    Follow up Falls prevention discussed;Falls evaluation completed Falls evaluation completed Falls evaluation completed;Falls prevention discussed Falls evaluation completed;Education provided;Falls prevention discussed     FALL RISK PREVENTION PERTAINING TO THE HOME:  Any stairs in or around the home? Yes  If so, are there any without handrails? No  Home free of loose throw rugs in walkways, pet beds, electrical cords, etc? Yes  Adequate lighting in your home to reduce risk of falls? Yes   ASSISTIVE DEVICES UTILIZED TO PREVENT FALLS:  Life alert? No  Use of a cane, walker or w/c? Yes - a power chair Grab bars in the bathroom? Yes  Shower chair or bench in shower? Yes  Elevated toilet seat or a handicapped toilet? Yes    Cognitive Function:        07/15/2022    9:29 AM 04/17/2020    2:49 PM 01/01/2018    1:20 PM 11/08/2016    8:41 AM  6CIT Screen  What Year? 0 points 0 points 0 points 0 points  What month? 0 points 0 points 0 points 0 points  What time? 0 points 0 points 0 points 0 points  Count back from 20 0 points 0 points 0 points 0 points  Months in reverse 0 points 0 points 0 points 0 points  Repeat phrase 0 points 0 points 2 points 0 points  Total Score 0 points 0 points 2 points 0 points    Immunizations Immunization History  Administered Date(s) Administered   Influenza, High Dose Seasonal PF 02/21/2016, 02/12/2017   Influenza,inj,Quad PF,6+ Mos 02/22/2015   Influenza-Unspecified 02/16/2014    Pneumococcal Conjugate-13 08/31/2013   Td 08/31/2013   Zoster, Live 11/12/2011    TDAP status: Up to date  Flu Vaccine status: Due, Education has been provided regarding the importance of this vaccine. Advised may receive this vaccine at local pharmacy or Health Dept. Aware to provide a copy of the vaccination record if obtained from local pharmacy or Health Dept. Verbalized acceptance and understanding.  Pneumococcal vaccine status: Due, Education has been provided regarding the importance of this vaccine. Advised may receive this vaccine at local pharmacy or Health Dept. Aware to provide a copy of the vaccination record if obtained from local pharmacy or Health Dept. Verbalized acceptance and understanding.  Covid-19 vaccine status: Declined, Education has been provided regarding the importance of this vaccine but patient still declined. Advised may receive this vaccine at local pharmacy or Health Dept.or vaccine clinic. Aware to provide a copy of the vaccination record if obtained from local pharmacy or Health Dept. Verbalized acceptance and understanding.  Qualifies for Shingles Vaccine? Yes   Zostavax completed Yes   Shingrix Completed?: No.  Education has been provided regarding the importance of this vaccine. Patient has been advised to call insurance company to determine out of pocket expense if they have not yet received this vaccine. Advised may also receive vaccine at local pharmacy or Health Dept. Verbalized acceptance and understanding.  Screening Tests Health Maintenance  Topic Date Due   Zoster Vaccines- Shingrix (1 of 2) Never done   Pneumonia Vaccine 28+ Years old (2 of 2 - PPSV23 or PCV20) 09/01/2014   Medicare Annual Wellness (AWV)  07/16/2023   DTaP/Tdap/Td (2 - Tdap) 09/01/2023   DEXA SCAN  Completed   HPV VACCINES  Aged Out   INFLUENZA VACCINE  Discontinued   COVID-19 Vaccine  Discontinued    Health Maintenance  Health Maintenance Due  Topic Date Due   Zoster  Vaccines- Shingrix (1 of 2) Never done   Pneumonia Vaccine 46+ Years old (2 of 2 - PPSV23 or PCV20) 09/01/2014    Colorectal cancer screening: No longer required.   Mammogram status: No longer required due to age.  Declined referral for BDS  Lung Cancer Screening: (Low Dose CT Chest recommended if Age 88-80 years, 30 pack-year currently smoking OR have quit w/in 15years.) does not qualify.    Additional Screening:  Hepatitis C Screening: does not qualify; Completed no  Vision Screening: Recommended annual ophthalmology exams for early detection of glaucoma and other disorders of the eye. Is the patient up to date with their annual eye exam?  Yes  Who is the provider or what is the name of the office in which the patient attends annual eye exams? [place] If pt is not established with a provider, would they like to be referred to a provider to establish care? No .   Dental Screening: Recommended annual dental exams for proper oral hygiene  Community Resource Referral / Chronic Care Management: CRR required this visit?  No   CCM required this visit?  No      Plan:     I have personally reviewed and noted the following in the patient's chart:   Medical and social history Use of alcohol, tobacco or illicit drugs  Current medications and supplements including opioid prescriptions. Patient is not currently taking opioid prescriptions. Functional ability and status Nutritional status Physical activity Advanced directives List of other physicians Hospitalizations, surgeries, and ER visits in previous 12 months Vitals Screenings to include cognitive, depression, and falls Referrals and appointments  In addition, I have reviewed and discussed with patient certain preventive protocols, quality metrics, and best practice recommendations. A written personalized care plan for preventive services as well as general preventive health recommendations were provided to patient.      Dionisio David, LPN   [id number]   Nurse Notes: none

## 2022-07-26 ENCOUNTER — Ambulatory Visit: Payer: Medicare PPO | Admitting: Nurse Practitioner

## 2022-07-26 DIAGNOSIS — G9332 Myalgic encephalomyelitis/chronic fatigue syndrome: Secondary | ICD-10-CM

## 2022-07-26 DIAGNOSIS — F5101 Primary insomnia: Secondary | ICD-10-CM

## 2022-07-26 DIAGNOSIS — I517 Cardiomegaly: Secondary | ICD-10-CM

## 2022-07-26 DIAGNOSIS — Z23 Encounter for immunization: Secondary | ICD-10-CM

## 2022-07-26 DIAGNOSIS — E782 Mixed hyperlipidemia: Secondary | ICD-10-CM

## 2022-07-26 DIAGNOSIS — G4733 Obstructive sleep apnea (adult) (pediatric): Secondary | ICD-10-CM

## 2022-07-26 DIAGNOSIS — E559 Vitamin D deficiency, unspecified: Secondary | ICD-10-CM

## 2022-07-26 DIAGNOSIS — I700 Atherosclerosis of aorta: Secondary | ICD-10-CM

## 2022-07-26 DIAGNOSIS — E538 Deficiency of other specified B group vitamins: Secondary | ICD-10-CM

## 2022-07-26 DIAGNOSIS — J4541 Moderate persistent asthma with (acute) exacerbation: Secondary | ICD-10-CM

## 2022-07-26 DIAGNOSIS — F3340 Major depressive disorder, recurrent, in remission, unspecified: Secondary | ICD-10-CM

## 2022-07-26 DIAGNOSIS — K219 Gastro-esophageal reflux disease without esophagitis: Secondary | ICD-10-CM

## 2022-07-26 DIAGNOSIS — Z6841 Body Mass Index (BMI) 40.0 and over, adult: Secondary | ICD-10-CM

## 2022-07-26 DIAGNOSIS — E041 Nontoxic single thyroid nodule: Secondary | ICD-10-CM

## 2022-07-26 DIAGNOSIS — I890 Lymphedema, not elsewhere classified: Secondary | ICD-10-CM

## 2022-07-26 DIAGNOSIS — I10 Essential (primary) hypertension: Secondary | ICD-10-CM

## 2022-08-18 NOTE — Patient Instructions (Incomplete)

## 2022-08-21 ENCOUNTER — Ambulatory Visit: Payer: Medicare PPO | Admitting: Nurse Practitioner

## 2022-08-21 ENCOUNTER — Encounter: Payer: Self-pay | Admitting: Nurse Practitioner

## 2022-08-21 DIAGNOSIS — E559 Vitamin D deficiency, unspecified: Secondary | ICD-10-CM

## 2022-08-21 DIAGNOSIS — E041 Nontoxic single thyroid nodule: Secondary | ICD-10-CM | POA: Diagnosis not present

## 2022-08-21 DIAGNOSIS — I890 Lymphedema, not elsewhere classified: Secondary | ICD-10-CM | POA: Diagnosis not present

## 2022-08-21 DIAGNOSIS — E538 Deficiency of other specified B group vitamins: Secondary | ICD-10-CM | POA: Diagnosis not present

## 2022-08-21 DIAGNOSIS — I10 Essential (primary) hypertension: Secondary | ICD-10-CM | POA: Diagnosis not present

## 2022-08-21 DIAGNOSIS — E782 Mixed hyperlipidemia: Secondary | ICD-10-CM | POA: Diagnosis not present

## 2022-08-21 DIAGNOSIS — F3341 Major depressive disorder, recurrent, in partial remission: Secondary | ICD-10-CM

## 2022-08-21 DIAGNOSIS — K219 Gastro-esophageal reflux disease without esophagitis: Secondary | ICD-10-CM | POA: Diagnosis not present

## 2022-08-21 DIAGNOSIS — J4540 Moderate persistent asthma, uncomplicated: Secondary | ICD-10-CM

## 2022-08-21 DIAGNOSIS — M25511 Pain in right shoulder: Secondary | ICD-10-CM

## 2022-08-21 DIAGNOSIS — F5101 Primary insomnia: Secondary | ICD-10-CM

## 2022-08-21 DIAGNOSIS — I517 Cardiomegaly: Secondary | ICD-10-CM | POA: Diagnosis not present

## 2022-08-21 DIAGNOSIS — I700 Atherosclerosis of aorta: Secondary | ICD-10-CM | POA: Diagnosis not present

## 2022-08-21 DIAGNOSIS — Z7409 Other reduced mobility: Secondary | ICD-10-CM

## 2022-08-21 DIAGNOSIS — I872 Venous insufficiency (chronic) (peripheral): Secondary | ICD-10-CM

## 2022-08-21 DIAGNOSIS — Z6841 Body Mass Index (BMI) 40.0 and over, adult: Secondary | ICD-10-CM | POA: Diagnosis not present

## 2022-08-21 DIAGNOSIS — G4733 Obstructive sleep apnea (adult) (pediatric): Secondary | ICD-10-CM

## 2022-08-21 DIAGNOSIS — R399 Unspecified symptoms and signs involving the genitourinary system: Secondary | ICD-10-CM

## 2022-08-21 DIAGNOSIS — G8929 Other chronic pain: Secondary | ICD-10-CM

## 2022-08-21 MED ORDER — NEBIVOLOL HCL 5 MG PO TABS: ORAL_TABLET | ORAL | 4 refills | Status: DC

## 2022-08-21 MED ORDER — IRBESARTAN 300 MG PO TABS: ORAL_TABLET | ORAL | 4 refills | Status: DC

## 2022-08-21 MED ORDER — FLUTICASONE-SALMETEROL 250-50 MCG/ACT IN AEPB: INHALATION_SPRAY | RESPIRATORY_TRACT | 4 refills | Status: DC

## 2022-08-21 MED ORDER — SPIRIVA RESPIMAT 1.25 MCG/ACT IN AERS: 2.0000 | INHALATION_SPRAY | Freq: Every day | RESPIRATORY_TRACT | 3 refills | Status: DC

## 2022-08-21 NOTE — Assessment & Plan Note (Signed)
Chronic, stable.  Continue 100% use of CPAP at night. 

## 2022-08-21 NOTE — Assessment & Plan Note (Signed)
BMI 59.28 last visit with underlying HTN and ASTHMA.  Recommended eating smaller high protein, low fat meals more frequently and exercising 30 mins a day 5 times a week with a goal of 10-15lb weight loss in the next 3 months. Patient voiced their understanding and motivation to adhere to these recommendations.  

## 2022-08-21 NOTE — Progress Notes (Signed)
BP 128/74 (BP Location: Right Arm, Patient Position: Sitting, Cuff Size: Large)   Pulse 66   Temp 98.3 F (36.8 C) (Oral)   LMP  (LMP Unknown)   SpO2 97%    Subjective:    Patient ID: [name], female    DOB: 1940-12-18, 83 y.o.   MRN: [id number]  HPI: [name] is a 82 y.o. female  Chief Complaint  Patient presents with   Hypertension   Hyperlipidemia   Mood   Memory Loss   Shoulder Pain   bladder issue    Has no pressure for past month   HYPERTENSION / HYPERLIPIDEMIA Continues on Furosemide, ASA, Avapro, Bystolic. Followed cardiology in past, last visit was 11/29/20 -- no recent visits, is thinking about returning for visit.  She stopped Atorvastatin months back as was having issues with urination where she could not empty fully -- she read online that this can be side effect.  We changed to Rosuvastatin, this caused rash to arms.  Continues to have some urinary urgency and occasional feeling of heavy flow.  Continues B12 and Vit D supplements at home for history of low levels and concerns for memory loss in past.   Chronic lymphedema present and was going for massages -- has not seen massage therapist since August 2023 as the provider's husband is very sick. Taking Tylenol twice a day.  Uses compression pumps at home.  Has seen vascular in past, last 06/07/19, has not returned.   Satisfied with current treatment? yes Duration of hypertension: chronic BP monitoring frequency: a few days a week BP range: 110-120/70 range at home BP medication side effects: no Duration of hyperlipidemia: chronic Cholesterol medication side effects: no Cholesterol supplements: none Medication compliance: good compliance Aspirin: no Recent stressors: no Recurrent headaches: no Visual changes: no Palpitations: no Dyspnea: occasional at baseline Chest pain: no Lower extremity edema: baseline Dizzy/lightheaded: none    07/15/2022    9:29 AM 04/17/2020    2:49 PM 01/01/2018    1:20 PM  11/08/2016    8:41 AM  6CIT Screen  What Year? 0 points 0 points 0 points 0 points  What month? 0 points 0 points 0 points 0 points  What time? 0 points 0 points 0 points 0 points  Count back from 20 0 points 0 points 0 points 0 points  Months in reverse 0 points 0 points 0 points 0 points  Repeat phrase 0 points 0 points 2 points 0 points  Total Score 0 points 0 points 2 points 0 points   COPD Continues Wixela & Spiriva and Albuterol as needed. Uses CPAP 100% at home. Last saw Dr. Mortimer Fries with pulmonary - 10/15/2018, has not returned since that time.  Has been wheezing more with increased pollen. COPD status: stable Satisfied with current treatment?: yes Oxygen use: no Dyspnea frequency: occasional Cough frequency: no Rescue inhaler frequency: twice a day at present Limitation of activity: no Productive cough: none Last Spirometry: with pulmonary Pneumovax: Up to Date Influenza: Up to Date  SHOULDER PAIN Having right shoulder pain, feels it diffusely.  Been present for months, late fall/ early winter. Does play games on her phone often.   Is also noticing spasms to left upper thigh on and off, right leg no longer does this.  Has been ongoing for months.  Tylenol helps with this, however she stopped taking due to concerns for kidneys.   Duration: months Involved shoulder: right Mechanism of injury: unknown Location: diffuse Onset:sudden Severity:  4/10  Quality:  dull, aching, and throbbing Frequency: constant Radiation: no Aggravating factors: lifting and movement  Alleviating factors:  OT, physical therapy, and rest  Status: stable Treatments attempted: rest  Relief with NSAIDs?:  No NSAIDs Taken Weakness: no Numbness: no Decreased grip strength: no Redness: no Swelling: no Bruising: no Fevers: no    DEPRESSION Continues on Wellbutrin and Trazodone 50 MG at bedtime as needed. Does not take Trazodone very often -- very seldom. Mood status: stable Satisfied with  current treatment?: yes Symptom severity: mild  Duration of current treatment : chronic Side effects: no Medication compliance: good compliance Psychotherapy/counseling: none Depressed mood: no Anxious mood: no Anhedonia: no Significant weight loss or gain: no Insomnia: yes hard to fall asleep Fatigue: no Feelings of worthlessness or guilt: no Impaired concentration/indecisiveness: no Suicidal ideations: no Hopelessness: no Crying spells: no    08/21/2022   11:10 AM 07/15/2022    9:22 AM 07/04/2022   10:38 AM 03/19/2022    9:37 AM 09/04/2021    8:32 AM  Depression screen PHQ 2/9  Decreased Interest 0 0 0 0 0  Down, Depressed, Hopeless 0 0 0 0 0  PHQ - 2 Score 0 0 0 0 0  Altered sleeping 0 0 0 0 0  Tired, decreased energy 2 0 2 3 1   Change in appetite 0 0 0 0 0  Feeling bad or failure about yourself  0 0 0 0 0  Trouble concentrating 0 0 0 0 0  Moving slowly or fidgety/restless 0 0 0 0 0  Suicidal thoughts 0 0 0 0 0  PHQ-9 Score 2 0 2 3 1   Difficult doing work/chores Not difficult at all Not difficult at all Not difficult at all Not difficult at all Not difficult at all       08/21/2022   11:10 AM 07/04/2022   10:39 AM 03/19/2022   10:12 AM 07/09/2021    4:36 PM  GAD 7 : Generalized Anxiety Score  Nervous, Anxious, on Edge 0 0 0 0  Control/stop worrying 0 1 0 0  Worry too much - different things 0 3 0 0  Trouble relaxing 0 0 0 0  Restless 0 0 0 0  Easily annoyed or irritable 0 0 0 0  Afraid - awful might happen 0 0 0 0  Total GAD 7 Score 0 4 0 0  Anxiety Difficulty Not difficult at all Not difficult at all Not difficult at all Not difficult at all    Relevant past medical, surgical, family and social history reviewed and updated as indicated. Interim medical history since our last visit reviewed. Allergies and medications reviewed and updated.  Review of Systems  Constitutional:  Negative for activity change, appetite change, diaphoresis, fatigue and fever.   Respiratory:  Positive for cough (occasional at baseline) and wheezing (occasional). Negative for chest tightness and shortness of breath.   Cardiovascular:  Negative for chest pain, palpitations and leg swelling.  Gastrointestinal: Negative.   Endocrine: Negative for cold intolerance, heat intolerance, polydipsia, polyphagia and polyuria.  Musculoskeletal:  Positive for arthralgias.  Neurological: Negative.   Psychiatric/Behavioral: Negative.      Per HPI unless specifically indicated above     Objective:    BP 128/74 (BP Location: Right Arm, Patient Position: Sitting, Cuff Size: Large)   Pulse 66   Temp 98.3 F (36.8 C) (Oral)   LMP  (LMP Unknown)   SpO2 97%   Wt Readings from Last 3 Encounters:  07/15/22 Marland Kitchen)  340 lb (154.2 kg)  06/04/21 (!) 340 lb (154.2 kg)  11/29/20 (!) 340 lb (154.2 kg)    Physical Exam Vitals and nursing note reviewed.  Constitutional:      General: She is awake. She is not in acute distress.    Appearance: She is well-developed and well-groomed. She is obese. She is not ill-appearing or toxic-appearing.  HENT:     Head: Normocephalic.     Right Ear: Hearing and external ear normal.     Left Ear: Hearing and external ear normal.  Eyes:     General: Lids are normal.        Right eye: No discharge.        Left eye: No discharge.     Conjunctiva/sclera: Conjunctivae normal.     Pupils: Pupils are equal, round, and reactive to light.  Neck:     Thyroid: No thyromegaly.     Vascular: No carotid bruit.  Cardiovascular:     Rate and Rhythm: Normal rate and regular rhythm.     Heart sounds: Normal heart sounds. No murmur heard.    No gallop.  Pulmonary:     Effort: Pulmonary effort is normal. No accessory muscle usage or respiratory distress.     Breath sounds: Wheezing present. No decreased breath sounds or rhonchi.     Comments: Occasional expiratory wheeze noted, but overall clear. Abdominal:     General: Bowel sounds are normal. There is no  distension.     Palpations: Abdomen is soft.     Tenderness: There is no abdominal tenderness.  Musculoskeletal:     Right shoulder: No swelling or tenderness. Normal range of motion. Normal strength.     Left shoulder: Normal.     Cervical back: Normal range of motion and neck supple.     Right lower leg: 2+ Edema present.     Left lower leg: 2+ Edema present.  Lymphadenopathy:     Cervical: No cervical adenopathy.  Skin:    General: Skin is warm and dry.  Neurological:     Mental Status: She is alert and oriented to person, place, and time.  Psychiatric:        Attention and Perception: Attention normal.        Mood and Affect: Mood normal.        Speech: Speech normal.        Behavior: Behavior normal. Behavior is cooperative.        Thought Content: Thought content normal.    Results for orders placed or performed in visit on 09/24/21  Urine Culture   Specimen: Urine   UR  Result Value Ref Range   Urine Culture, Routine Final report (A)    Organism ID, Bacteria Proteus mirabilis (A)    Antimicrobial Susceptibility Comment   Microscopic Examination   Urine  Result Value Ref Range   WBC, UA 11-30 (A) 0 - 5 /hpf   RBC, Urine 0-2 0 - 2 /hpf   Epithelial Cells (non renal) 0-10 0 - 10 /hpf   Crystals Present (A) N/A   Crystal Type Triple Phosphate N/A   Mucus, UA Present (A) Not Estab.   Bacteria, UA Many (A) None seen/Few  Urinalysis, Routine w reflex microscopic  Result Value Ref Range   Specific Gravity, UA 1.020 1.005 - 1.030   pH, UA 7.5 5.0 - 7.5   Color, UA Yellow Yellow   Appearance Ur Cloudy (A) Clear   Leukocytes,UA 2+ (A) Negative   Protein,UA  1+ (A) Negative/Trace   Glucose, UA Negative Negative   Ketones, UA Negative Negative   RBC, UA Negative Negative   Bilirubin, UA Negative Negative   Urobilinogen, Ur 0.2 0.2 - 1.0 mg/dL   Nitrite, UA Positive (A) Negative   Microscopic Examination See below:       Assessment & Plan:   Problem List Items  Addressed This Visit       Cardiovascular and Mediastinum   Aortic atherosclerosis    Chronic.  Noted on CT scan 09/10/2017.  Recommend continue ASA and statin daily for prevention.      Relevant Medications   irbesartan (AVAPRO) 300 MG tablet   nebivolol (BYSTOLIC) 5 MG tablet   Other Relevant Orders   Comprehensive metabolic panel   Lipid Panel w/o Chol/HDL Ratio   Cardiomegaly    Chronic, recommend she schedule follow-up with cardiology.  Continue current BP regimen.      Relevant Medications   irbesartan (AVAPRO) 300 MG tablet   nebivolol (BYSTOLIC) 5 MG tablet   Chronic venous insufficiency    Chronic, ongoing.  Recommend use of Tylenol as needed for pain, max 3000 MG total daily dosing.  Continue using compression wraps daily and massage therapy.  Will have her return to vascular as needed.      Relevant Medications   irbesartan (AVAPRO) 300 MG tablet   nebivolol (BYSTOLIC) 5 MG tablet   Essential hypertension, benign    Chronic, stable.  BP at goal on recheck today and on home readings recently. Continue current medication regimen and adjust as needed + collaboration with cardiology -- recommend she return for follow-up.  Recommend she monitor BP at least a few mornings a week at home and document.  DASH diet at home.  Labs: CBC, CMP, TSH, urine ALB.  Refills as needed.       Relevant Medications   irbesartan (AVAPRO) 300 MG tablet   nebivolol (BYSTOLIC) 5 MG tablet   Other Relevant Orders   TSH   Comprehensive metabolic panel   Microalbumin, Urine Waived     Respiratory   Asthma in adult, moderate persistent, uncomplicated    Chronic, stable at this time.  Continue current inhaler regimen and adjust as needed.  Refills sent.  Treat exacerbation as needed.  Pollen is a trigger for her.  Obtain Alpha 1 testing, has not been obtained in past and would be beneficial with her history.      Relevant Medications   fluticasone-salmeterol (WIXELA INHUB) 250-50 MCG/ACT  AEPB   Tiotropium Bromide Monohydrate (SPIRIVA RESPIMAT) 1.25 MCG/ACT AERS   Other Relevant Orders   Alpha-1-antitrypsin   OSA (obstructive sleep apnea)    Chronic, stable.  Continue 100% use of CPAP at night.        Digestive   GERD without esophagitis    Chronic, stable with good control.  Continue current medication regimen and adjust as needed.  Check mag level yearly.  Risks of PPI use were discussed with patient including bone loss, C. Diff diarrhea, pneumonia, infections, CKD, electrolyte abnormalities. Verbalizes understanding and chooses to continue the medication.       Relevant Orders   Magnesium     Endocrine   Thyroid nodule    Followed by Dr. Tami Ribas in past.  Recheck thyroid labs today.      Relevant Medications   nebivolol (BYSTOLIC) 5 MG tablet   Other Relevant Orders   T4, free   Thyroid peroxidase antibody   TSH  Other   BMI 50.0-59.9, adult    Refer to morbid obesity plan of care.      Chronic right shoulder pain    Chronic, suspect some OA present.  Recommend she return to taking Tylenol 1000 MG TID as needed.  Educated her on this regimen and that it is processed via liver.  Voltaren gel as needed and continue to perform OT stretches at home.      Depression    Chronic, stable.  Denies SI/HI.  Continue current medication regimen and adjust as needed.      Hyperlipidemia    Chronic, ongoing.  Did not tolerate Atorvastatin or Rosuvastatin.  Recheck labs today and discuss possible Zetia trial if elevations or monitor labs only due to advanced age and to minimize medications.      Relevant Medications   irbesartan (AVAPRO) 300 MG tablet   nebivolol (BYSTOLIC) 5 MG tablet   Other Relevant Orders   Comprehensive metabolic panel   Lipid Panel w/o Chol/HDL Ratio   Insomnia    Chronic, ongoing.  Benefit from Trazodone at night as needed.  Continue current medication regimen and adjust as needed.  Continue focus on sleep hygiene.      Lymphedema     Chronic, ongoing.  Recommend use of Tylenol as needed for pain, max 3000 MG total daily dosing.  Avoid Ibuprofen or NSAID products.  Continue using compression wraps daily and massage therapy.        Relevant Orders   C-reactive protein   Sed Rate (ESR)   Mobility impaired    Currently uses power chair.  PT and OT as needed, recently had this and offered benefit.  May need home primary care in future, which we discussed.      Morbid obesity - Primary    BMI 59.28 last visit with underlying HTN and ASTHMA.  Recommended eating smaller high protein, low fat meals more frequently and exercising 30 mins a day 5 times a week with a goal of 10-15lb weight loss in the next 3 months. Patient voiced their understanding and motivation to adhere to these recommendations.       Vitamin B12 deficiency    Ongoing, continue daily supplement and recheck B12 level today.      Relevant Orders   CBC with Differential/Platelet   Vitamin B12   Vitamin D deficiency    Ongoing and stable.  Continue daily supplement and recheck level today.      Relevant Orders   VITAMIN D 25 Hydroxy (Vit-D Deficiency, Fractures)   Other Visit Diagnoses     Urinary symptom or sign       UA outpatient and treat as needed.   Relevant Orders   Urinalysis, Routine w reflex microscopic        Follow up plan: Return in about 6 months (around 02/20/2023).

## 2022-08-21 NOTE — Assessment & Plan Note (Signed)
Chronic, stable.  Denies SI/HI.  Continue current medication regimen and adjust as needed. 

## 2022-08-21 NOTE — Assessment & Plan Note (Signed)
Chronic.  Noted on CT scan 09/10/2017.  Recommend continue ASA and statin daily for prevention. 

## 2022-08-21 NOTE — Assessment & Plan Note (Signed)
Chronic, stable with good control.  Continue current medication regimen and adjust as needed.  Check mag level yearly.  Risks of PPI use were discussed with patient including bone loss, C. Diff diarrhea, pneumonia, infections, CKD, electrolyte abnormalities. Verbalizes understanding and chooses to continue the medication.

## 2022-08-21 NOTE — Assessment & Plan Note (Signed)
Chronic, ongoing.  Benefit from Trazodone at night as needed.  Continue current medication regimen and adjust as needed.  Continue focus on sleep hygiene.

## 2022-08-21 NOTE — Assessment & Plan Note (Signed)
Chronic, recommend she schedule follow-up with cardiology.  Continue current BP regimen.

## 2022-08-21 NOTE — Assessment & Plan Note (Signed)
Ongoing and stable.  Continue daily supplement and recheck level today.

## 2022-08-21 NOTE — Assessment & Plan Note (Signed)
Chronic, stable.  BP at goal on recheck today and on home readings recently. Continue current medication regimen and adjust as needed + collaboration with cardiology -- recommend she return for follow-up.  Recommend she monitor BP at least a few mornings a week at home and document.  DASH diet at home.  Labs: CBC, CMP, TSH, urine ALB.  Refills as needed.

## 2022-08-21 NOTE — Assessment & Plan Note (Signed)
Refer to morbid obesity plan of care. 

## 2022-08-21 NOTE — Assessment & Plan Note (Signed)
Chronic, ongoing.  Recommend use of Tylenol as needed for pain, max 3000 MG total daily dosing.  Continue using compression wraps daily and massage therapy.  Will have her return to vascular as needed. 

## 2022-08-21 NOTE — Assessment & Plan Note (Addendum)
Chronic, ongoing.  Recommend use of Tylenol as needed for pain, max 3000 MG total daily dosing.  Avoid Ibuprofen or NSAID products.  Continue using compression wraps daily and massage therapy.

## 2022-08-21 NOTE — Assessment & Plan Note (Signed)
Followed by Dr. Tami Ribas in past.  Recheck thyroid labs today.

## 2022-08-21 NOTE — Assessment & Plan Note (Signed)
Ongoing, continue daily supplement and recheck B12 level today. 

## 2022-08-21 NOTE — Assessment & Plan Note (Signed)
Chronic, ongoing.  Did not tolerate Atorvastatin or Rosuvastatin.  Recheck labs today and discuss possible Zetia trial if elevations or monitor labs only due to advanced age and to minimize medications.

## 2022-08-21 NOTE — Assessment & Plan Note (Addendum)
Chronic, stable at this time.  Continue current inhaler regimen and adjust as needed.  Refills sent.  Treat exacerbation as needed.  Pollen is a trigger for her.  Obtain Alpha 1 testing, has not been obtained in past and would be beneficial with her history.

## 2022-08-21 NOTE — Assessment & Plan Note (Signed)
Currently uses power chair.  PT and OT as needed, recently had this and offered benefit.  May need home primary care in future, which we discussed.

## 2022-08-21 NOTE — Assessment & Plan Note (Signed)
Chronic, suspect some OA present.  Recommend she return to taking Tylenol 1000 MG TID as needed.  Educated her on this regimen and that it is processed via liver.  Voltaren gel as needed and continue to perform OT stretches at home.

## 2022-08-22 LAB — CBC WITH DIFFERENTIAL/PLATELET
Basophils Absolute: 0 10*3/uL (ref 0.0–0.2)
Basos: 0 %
EOS (ABSOLUTE): 0 10*3/uL (ref 0.0–0.4)
Eos: 0 %
Hematocrit: 43.4 % (ref 34.0–46.6)
Hemoglobin: 14.3 g/dL (ref 11.1–15.9)
Immature Grans (Abs): 0 10*3/uL (ref 0.0–0.1)
Immature Granulocytes: 0 %
Lymphocytes Absolute: 1.7 10*3/uL (ref 0.7–3.1)
Lymphs: 35 %
MCH: 30.4 pg (ref 26.6–33.0)
MCHC: 32.9 g/dL (ref 31.5–35.7)
MCV: 92 fL (ref 79–97)
Monocytes Absolute: 0.4 10*3/uL (ref 0.1–0.9)
Monocytes: 9 %
Neutrophils Absolute: 2.6 10*3/uL (ref 1.4–7.0)
Neutrophils: 56 %
Platelets: 234 10*3/uL (ref 150–450)
RBC: 4.71 x10E6/uL (ref 3.77–5.28)
RDW: 13.4 % (ref 11.7–15.4)
WBC: 4.7 10*3/uL (ref 3.4–10.8)

## 2022-08-22 LAB — COMPREHENSIVE METABOLIC PANEL
ALT: 13 IU/L (ref 0–32)
AST: 15 IU/L (ref 0–40)
Albumin/Globulin Ratio: 1.7 (ref 1.2–2.2)
Albumin: 4 g/dL (ref 3.7–4.7)
Alkaline Phosphatase: 86 IU/L (ref 44–121)
BUN/Creatinine Ratio: 20 (ref 12–28)
BUN: 19 mg/dL (ref 8–27)
Bilirubin Total: 0.6 mg/dL (ref 0.0–1.2)
CO2: 19 mmol/L — ABNORMAL LOW (ref 20–29)
Calcium: 10.2 mg/dL (ref 8.7–10.3)
Chloride: 103 mmol/L (ref 96–106)
Creatinine, Ser: 0.94 mg/dL (ref 0.57–1.00)
Globulin, Total: 2.3 g/dL (ref 1.5–4.5)
Glucose: 90 mg/dL (ref 70–99)
Potassium: 4.7 mmol/L (ref 3.5–5.2)
Sodium: 139 mmol/L (ref 134–144)
Total Protein: 6.3 g/dL (ref 6.0–8.5)
eGFR: 61 mL/min/{1.73_m2} (ref >59)

## 2022-08-22 LAB — LIPID PANEL W/O CHOL/HDL RATIO
Cholesterol, Total: 222 mg/dL — ABNORMAL HIGH (ref 100–199)
HDL: 79 mg/dL (ref >39)
LDL Chol Calc (NIH): 121 mg/dL — ABNORMAL HIGH (ref 0–99)
Triglycerides: 127 mg/dL (ref 0–149)
VLDL Cholesterol Cal: 22 mg/dL (ref 5–40)

## 2022-08-22 LAB — ALPHA-1-ANTITRYPSIN: A-1 Antitrypsin: 167 mg/dL (ref 101–187)

## 2022-08-22 LAB — TSH: TSH: 3.36 u[IU]/mL (ref 0.450–4.500)

## 2022-08-22 LAB — VITAMIN B12: Vitamin B-12: 574 pg/mL (ref 232–1245)

## 2022-08-22 LAB — THYROID PEROXIDASE ANTIBODY: Thyroperoxidase Ab SerPl-aCnc: <9 IU/mL (ref 0–34)

## 2022-08-22 LAB — VITAMIN D 25 HYDROXY (VIT D DEFICIENCY, FRACTURES): Vit D, 25-Hydroxy: 68.6 ng/mL (ref 30.0–100.0)

## 2022-08-22 LAB — SEDIMENTATION RATE: Sed Rate: 19 mm/hr (ref 0–40)

## 2022-08-22 LAB — C-REACTIVE PROTEIN: CRP: <1 mg/L (ref 0–10)

## 2022-08-22 LAB — T4, FREE: Free T4: 1.49 ng/dL (ref 0.82–1.77)

## 2022-08-22 LAB — MAGNESIUM: Magnesium: 1.8 mg/dL (ref 1.6–2.3)

## 2022-08-22 NOTE — Progress Notes (Signed)
Contacted via MyChart   Good morning [name], your labs have returned and overall they look great.  The only thing that is elevated are your cholesterol levels -- I know you have not tolerate statins.  We could consider a trial of Zetia, which is not a statin and may help lower these OR you could take some fish oil daily and work on diet.  What are your thoughts on this?   - Kidney function, creatinine and eGFR, remains normal, as is liver function, AST and ALT.  - Remainder of labs fabulous!!  Any questions? Keep being amazing!!  Thank you for allowing me to participate in your care.  I appreciate you. Kindest regards, Wilmarie Sparlin

## 2022-08-23 ENCOUNTER — Encounter: Payer: Self-pay | Admitting: Nurse Practitioner

## 2022-08-26 MED ORDER — ATORVASTATIN CALCIUM 10 MG PO TABS
10.0000 mg | ORAL_TABLET | Freq: Every day | ORAL | 3 refills | Status: DC
Start: 2022-08-26 — End: 2023-02-21

## 2022-11-06 ENCOUNTER — Other Ambulatory Visit: Payer: Self-pay | Admitting: Nurse Practitioner

## 2022-11-06 DIAGNOSIS — J4540 Moderate persistent asthma, uncomplicated: Secondary | ICD-10-CM

## 2022-11-07 ENCOUNTER — Encounter: Payer: Self-pay | Admitting: Nurse Practitioner

## 2022-11-07 NOTE — Telephone Encounter (Signed)
Requested Prescriptions  Pending Prescriptions Disp Refills   SPIRIVA RESPIMAT 1.25 MCG/ACT AERS [Pharmacy Med Name: SPIRIVA RESPIMAT 1.25MCG IN 4GM 60D] 4 g 3    Sig: INHALE 2 PUFFS INTO THE LUNGS DAILY     Pulmonology:  Anticholinergic Agents Passed - 11/06/2022  3:07 PM      Passed - Valid encounter within last 12 months    Recent Outpatient Visits           2 months ago Morbid obesity (HCC)   [place] [facility] [place], [place] T, NP   3 months ago Moderate persistent asthma with exacerbation   [place] [facility] [place], [place] T, NP   4 months ago Essential hypertension, benign   [place] [facility] [place], [place] T, NP   7 months ago Recurrent major depressive disorder, in remission [facility])   [place] [facility] [place], Corrie Dandy T, NP   1 year ago Urinary tract infection without hematuria, site unspecified   [place] [facility] [place], Avanti, MD       Future Appointments             In 3 months [place], Dorie Rank, NP [facility]

## 2023-01-29 ENCOUNTER — Ambulatory Visit: Payer: Self-pay

## 2023-01-29 NOTE — Telephone Encounter (Signed)
Called and notified patient of [name]'s message. Patient verbalized understanding and is going to keep an eye on her HR and BP.

## 2023-01-29 NOTE — Telephone Encounter (Signed)
Patient stated that she was going to think about that for now. Wants to know what to do about medication with her BP being elevated today.

## 2023-01-29 NOTE — Telephone Encounter (Signed)
Chief Complaint: Low HR 40's this morning, should take BP medications Symptoms: No symptoms Frequency: Low HR only this morning when woke up Pertinent Negatives: Patient denies other symptoms Disposition: [] ED /[] Urgent Care (no appt availability in office) / [] Appointment(In office/virtual)/ []  [place] [facility]/ [] Home Care/ [] Refused Recommended Disposition /[] [facility]/ [x]  Follow-up with PCP Additional Notes: Patient says that she woke up at 0500 this morning and at 0700 she checked her oxygen level and the HR was 40's. She says she felt fine, no symptoms with it being that low. She says she started drinking water around 0930 and the HR started climbing up. She says she normally runs in the 50's, but never 40's and this was the first time. She says she felt like she was dehydrated because the skin on her hand when pinched up it stayed up and now it's not. She says now the HR is 56. She says she normally take her BP medications around 1100, but has not taken them today because she wasn't sure if they cause her HR to drop lower. I advised the nebivolol says it will lower the HR. I asked is she able to check her BP right now. She says she's not in the room where the monitor is that she would have to get up to the motorized chair. Advised not to worry about checking at this time, but hold off on taking until she hears back from [facility] nurse with the recommendations. She says she would also like to know what to do to get the doctor visits at home, Corrie Dandy mentioned it to her. Advised I will send all this to [facility] and expect a call back from someone regarding what is recommended. She verbalized understanding.   Reason for Disposition  Palpitations  Answer Assessment - Initial Assessment Questions 1. DESCRIPTION: "Please describe your heart rate or heartbeat that you are having" (e.g., fast/slow, regular/irregular, skipped or extra beats, "palpitations")     When woke up this morning  around 0500, took HR around 0700 and it was in the 40's, Now HR 56 2. ONSET: "When did it start?" (Minutes, hours or days)      Noticed it today at 0700 3. DURATION: "How long does it last" (e.g., seconds, minutes, hours)     Started drinking water at 0930 and HR started climbing 4. PATTERN "Does it come and go, or has it been constant since it started?"  "Does it get worse with exertion?"   "Are you feeling it now?"     Feeling fine now at 56 pulse  5. HEART RATE: "Can you tell me your heart rate?" "How many beats in 15 seconds?"  (Note: not all patients can do this)       56 6. RECURRENT SYMPTOM: "Have you ever had this before?" If Yes, ask: "When was the last time?" and "What happened that time?"      Never been in the 40's, usually stays in the 50's 7. CARDIAC HISTORY: "Do you have any history of heart disease?" (e.g., heart attack, angina, bypass surgery, angioplasty, arrhythmia)      No 8. OTHER SYMPTOMS: "Do you have any other symptoms?" (e.g., dizziness, chest pain, sweating, difficulty breathing)      No symptoms  Protocols used: Heart Rate and Heartbeat Questions-A-AH

## 2023-01-29 NOTE — Telephone Encounter (Signed)
Called and spoke with patient. Advised patient of [name]'s message patient states that she did not take her medication today and her BP was 158/96. States her pulse has been around 64 today, as low as 45/46 when she first woke up. States she thought she was dehydrated because last night, she picked her skin on the back of her hand and states it stood up. Patient states she has already drank 4 bottle of water today and her pulse has gotten better as the day has gone on. Patient states she is unsure of what she should do since her BP was elevated.

## 2023-02-03 ENCOUNTER — Telehealth: Payer: Self-pay | Admitting: Nurse Practitioner

## 2023-02-03 NOTE — Telephone Encounter (Signed)
Pt is calling in requesting to speak with [name]'s nurse. Pt says she is no longer taking one of the BP medications and wanted to inform the nurse of this change.

## 2023-02-04 NOTE — Telephone Encounter (Signed)
Called and spoke to patient. She states that she has not been taking the Nebivolol for the past week. States her pulse has been in the 60's and BP has been in the 130's so she is going to stay off of it. Patient states she has also been taking the fluid pill and states she has lost tons of fluid. Patient states she just wanted to let us know about her medications.   Routing to provider as an Financial planner.

## 2023-02-10 ENCOUNTER — Other Ambulatory Visit: Payer: Self-pay | Admitting: Nurse Practitioner

## 2023-02-11 NOTE — Telephone Encounter (Signed)
Requested medication (s) are due for refill today: yes  Requested medication (s) are on the active medication list: yes  Last refill:  12/26/21  Future visit scheduled: yes  Notes to clinic:  Unable to refill per protocol, cannot delegate.      Requested Prescriptions  Pending Prescriptions Disp Refills   triamcinolone cream (KENALOG) 0.1 % [Pharmacy Med Name: TRIAMCINOLONE 0.1% CREAM 454GM] 454 g 5    Sig: APPLY TO THE AFFECTED AREA(S) TWICE DAILY     Not Delegated - Dermatology:  Corticosteroids Failed - 02/10/2023  2:49 PM      Failed - This refill cannot be delegated      Passed - Valid encounter within last 12 months    Recent Outpatient Visits           5 months ago Morbid obesity (HCC)   [place] [facility] [place], [place] T, NP   7 months ago Moderate persistent asthma with exacerbation   [place] [facility] [place], Corrie Dandy T, NP   7 months ago Essential hypertension, benign   [place] [facility] [place], [place] T, NP   10 months ago Recurrent major depressive disorder, in remission (HCC)   [place] [facility] [place], Corrie Dandy T, NP   1 year ago Urinary tract infection without hematuria, site unspecified   [place] [facility] [place], Avanti, MD       Future Appointments             In 1 week [place], Dorie Rank, NP  [facility], [facility]

## 2023-02-16 NOTE — Patient Instructions (Signed)

## 2023-02-21 ENCOUNTER — Ambulatory Visit: Payer: Medicare PPO | Admitting: Nurse Practitioner

## 2023-02-21 ENCOUNTER — Encounter: Payer: Self-pay | Admitting: Nurse Practitioner

## 2023-02-21 ENCOUNTER — Telehealth (INDEPENDENT_AMBULATORY_CARE_PROVIDER_SITE_OTHER): Payer: Medicare PPO | Admitting: Nurse Practitioner

## 2023-02-21 DIAGNOSIS — I890 Lymphedema, not elsewhere classified: Secondary | ICD-10-CM

## 2023-02-21 DIAGNOSIS — F32A Depression, unspecified: Secondary | ICD-10-CM

## 2023-02-21 DIAGNOSIS — E785 Hyperlipidemia, unspecified: Secondary | ICD-10-CM

## 2023-02-21 DIAGNOSIS — J4540 Moderate persistent asthma, uncomplicated: Secondary | ICD-10-CM

## 2023-02-21 DIAGNOSIS — F3340 Major depressive disorder, recurrent, in remission, unspecified: Secondary | ICD-10-CM

## 2023-02-21 DIAGNOSIS — E782 Mixed hyperlipidemia: Secondary | ICD-10-CM

## 2023-02-21 DIAGNOSIS — I10 Essential (primary) hypertension: Secondary | ICD-10-CM | POA: Diagnosis not present

## 2023-02-21 DIAGNOSIS — I872 Venous insufficiency (chronic) (peripheral): Secondary | ICD-10-CM | POA: Diagnosis not present

## 2023-02-21 DIAGNOSIS — Z6841 Body Mass Index (BMI) 40.0 and over, adult: Secondary | ICD-10-CM

## 2023-02-21 MED ORDER — BUPROPION HCL ER (XL) 300 MG PO TB24: 300.0000 mg | ORAL_TABLET | Freq: Every day | ORAL | 4 refills | Status: DC

## 2023-02-21 MED ORDER — BENZONATATE 100 MG PO CAPS: 100.0000 mg | ORAL_CAPSULE | Freq: Three times a day (TID) | ORAL | 2 refills | Status: DC | PRN

## 2023-02-21 MED ORDER — ATORVASTATIN CALCIUM 20 MG PO TABS
20.0000 mg | ORAL_TABLET | Freq: Every day | ORAL | 4 refills | Status: DC
Start: 2023-02-21 — End: 2023-06-17

## 2023-02-21 NOTE — Assessment & Plan Note (Signed)
Refer to morbid obesity plan of care. 

## 2023-02-21 NOTE — Progress Notes (Signed)
Called patient  to schedule appt she stated that she will get her daughter to call ans schedule appt. Left CRM for [facility].

## 2023-02-21 NOTE — Assessment & Plan Note (Signed)
Chronic, stable.  Denies SI/HI.  Continue current medication regimen and adjust as needed.

## 2023-02-21 NOTE — Assessment & Plan Note (Signed)
Chronic, ongoing.  Recommend use of Tylenol as needed for pain, max 3000 MG total daily dosing.  Avoid Ibuprofen or NSAID products.  Continue using compression wraps daily and massage therapy.

## 2023-02-21 NOTE — Progress Notes (Signed)
LMP  (LMP Unknown)    Subjective:    Patient ID: [name], female    DOB: [birth year], 82 y.o.   MRN: [id number]  HPI: [name] is a 82 y.o. female  Chief Complaint  Patient presents with   Follow-up    6 month f/u   Virtual Visit via Video Note  I connected with [name] on 02/21/23 at  1:40 PM EDT by a video enabled telemedicine application and verified that I am speaking with the correct person using two identifiers.  Location: Patient: home Provider: work   I discussed the limitations of evaluation and management by telemedicine and the availability of in person appointments. The patient expressed understanding and agreed to proceed.  I discussed the assessment and treatment plan with the patient. The patient was provided an opportunity to ask questions and all were answered. The patient agreed with the plan and demonstrated an understanding of the instructions.   The patient was advised to call back or seek an in-person evaluation if the symptoms worsen or if the condition fails to improve as anticipated.  I provided 21 minutes of non-face-to-face time during this encounter.   Marjie Skiff, NP   HYPERTENSION / HYPERLIPIDEMIA Continues on Furosemide (has not taken in two days), ASA, Avapro.  Bystolic was too low and we stopped this, felt bad with this. Last saw cardiology, last visit was 11/29/20.  She is taking Atorvastatin, should be 20 MG pill.  Chronic lymphedema. Taking Tylenol twice a day.  Uses compression pumps at home.  Saw vascular in past, last 06/07/19, has not returned.   Satisfied with current treatment? yes Duration of hypertension: chronic BP monitoring frequency: not in last month BP range: 130/70 if Lasix on board, BP without this is higher BP medication side effects: no Duration of hyperlipidemia: chronic Cholesterol medication side effects: no Cholesterol supplements: none Medication compliance: good compliance Aspirin: no Recent  stressors: no Recurrent headaches: no Visual changes: no Palpitations: no Dyspnea: occasional at baseline Chest pain: no Lower extremity edema: baseline Dizzy/lightheaded: occasional  COPD Taking Wixela & Spiriva and Albuterol as needed.  Uses Tessalon for cough as needed.  Does not take Zyrtec daily.     Uses CPAP 100% at home. Last saw Dr. Belia Heman with pulmonary - 10/15/2018, has not returned since. COPD status: stable Satisfied with current treatment?: yes Oxygen use: no Dyspnea frequency: occasional at baseline Cough frequency: no Rescue inhaler frequency: every day Limitation of activity: no Productive cough: none Last Spirometry: with pulmonary Pneumovax: Up to Date Influenza: Up to Date   DEPRESSION Continues on Wellbutrin and Trazodone 50 MG at bedtime as needed -- has been sleeping well lately and rarely takes.  Mood status: stable Satisfied with current treatment?: yes Symptom severity: mild  Duration of current treatment : chronic Side effects: no Medication compliance: good compliance Psychotherapy/counseling: none Depressed mood: no Anxious mood: no Anhedonia: no Significant weight loss or gain: no Insomnia: yes hard to fall asleep Fatigue: no Feelings of worthlessness or guilt: no Impaired concentration/indecisiveness: no Suicidal ideations: no Hopelessness: no Crying spells: no    02/21/2023    2:07 PM 08/21/2022   11:10 AM 07/15/2022    9:22 AM 07/04/2022   10:38 AM 03/19/2022    9:37 AM  Depression screen PHQ 2/9  Decreased Interest 0 0 0 0 0  Down, Depressed, Hopeless 0 0 0 0 0  PHQ - 2 Score 0 0 0 0 0  Altered sleeping  1 0 0 0 0  Tired, decreased energy 1 2 0 2 3  Change in appetite 0 0 0 0 0  Feeling bad or failure about yourself  0 0 0 0 0  Trouble concentrating 0 0 0 0 0  Moving slowly or fidgety/restless 0 0 0 0 0  Suicidal thoughts 0 0 0 0 0  PHQ-9 Score 2 2 0 2 3  Difficult doing work/chores Not difficult at all Not difficult at all Not  difficult at all Not difficult at all Not difficult at all       02/21/2023    2:08 PM 08/21/2022   11:10 AM 07/04/2022   10:39 AM 03/19/2022   10:12 AM  GAD 7 : Generalized Anxiety Score  Nervous, Anxious, on Edge 0 0 0 0  Control/stop worrying 0 0 1 0  Worry too much - different things 0 0 3 0  Trouble relaxing 0 0 0 0  Restless 0 0 0 0  Easily annoyed or irritable 0 0 0 0  Afraid - awful might happen 0 0 0 0  Total GAD 7 Score 0 0 4 0  Anxiety Difficulty Not difficult at all Not difficult at all Not difficult at all Not difficult at all   Relevant past medical, surgical, family and social history reviewed and updated as indicated. Interim medical history since our last visit reviewed. Allergies and medications reviewed and updated.  Review of Systems  Constitutional:  Negative for activity change, appetite change, chills, fatigue and fever.  HENT: Negative.    Eyes:  Negative for pain and visual disturbance.  Respiratory:  Negative for cough, chest tightness, shortness of breath and wheezing.   Cardiovascular:  Negative for chest pain, palpitations and leg swelling.  Gastrointestinal: Negative.   Neurological:  Negative for dizziness, numbness and headaches.  Psychiatric/Behavioral: Negative.     Per HPI unless specifically indicated above     Objective:    LMP  (LMP Unknown)   Wt Readings from Last 3 Encounters:  07/15/22 (!) 340 lb (154.2 kg)  06/04/21 (!) 340 lb (154.2 kg)  11/29/20 (!) 340 lb (154.2 kg)    Physical Exam Vitals and nursing note reviewed.  Constitutional:      General: She is awake. She is not in acute distress.    Appearance: She is well-developed and well-groomed. She is obese. She is not ill-appearing or toxic-appearing.  HENT:     Head: Normocephalic.     Right Ear: Hearing normal.     Left Ear: Hearing normal.  Eyes:     General: Lids are normal.        Right eye: No discharge.        Left eye: No discharge.     Conjunctiva/sclera:  Conjunctivae normal.  Pulmonary:     Effort: Pulmonary effort is normal. No accessory muscle usage or respiratory distress.  Musculoskeletal:     Cervical back: Normal range of motion.  Neurological:     Mental Status: She is alert and oriented to person, place, and time.  Psychiatric:        Attention and Perception: Attention normal.        Mood and Affect: Mood normal.        Behavior: Behavior normal. Behavior is cooperative.        Thought Content: Thought content normal.        Judgment: Judgment normal.    Results for orders placed or performed in visit on 08/21/22  T4, free  Result Value Ref Range   Free T4 1.49 0.82 - 1.77 ng/dL  Thyroid peroxidase antibody  Result Value Ref Range   Thyroperoxidase Ab SerPl-aCnc <9 0 - 34 IU/mL  TSH  Result Value Ref Range   TSH 3.360 0.450 - 4.500 uIU/mL  CBC with Differential/Platelet  Result Value Ref Range   WBC 4.7 3.4 - 10.8 x10E3/uL   RBC 4.71 3.77 - 5.28 x10E6/uL   Hemoglobin 14.3 11.1 - 15.9 g/dL   Hematocrit 52.8 41.3 - 46.6 %   MCV 92 79 - 97 fL   MCH 30.4 26.6 - 33.0 pg   MCHC 32.9 31.5 - 35.7 g/dL   RDW 24.4 01.0 - 27.2 %   Platelets 234 150 - 450 x10E3/uL   Neutrophils 56 Not Estab. %   Lymphs 35 Not Estab. %   Monocytes 9 Not Estab. %   Eos 0 Not Estab. %   Basos 0 Not Estab. %   Neutrophils Absolute 2.6 1.4 - 7.0 x10E3/uL   Lymphocytes Absolute 1.7 0.7 - 3.1 x10E3/uL   Monocytes Absolute 0.4 0.1 - 0.9 x10E3/uL   EOS (ABSOLUTE) 0.0 0.0 - 0.4 x10E3/uL   Basophils Absolute 0.0 0.0 - 0.2 x10E3/uL   Immature Granulocytes 0 Not Estab. %   Immature Grans (Abs) 0.0 0.0 - 0.1 x10E3/uL  Comprehensive metabolic panel  Result Value Ref Range   Glucose 90 70 - 99 mg/dL   BUN 19 8 - 27 mg/dL   Creatinine, Ser 5.36 0.57 - 1.00 mg/dL   eGFR 61 >64 QI/HKV/4.25   BUN/Creatinine Ratio 20 12 - 28   Sodium 139 134 - 144 mmol/L   Potassium 4.7 3.5 - 5.2 mmol/L   Chloride 103 96 - 106 mmol/L   CO2 19 (L) 20 - 29 mmol/L    Calcium 10.2 8.7 - 10.3 mg/dL   Total Protein 6.3 6.0 - 8.5 g/dL   Albumin 4.0 3.7 - 4.7 g/dL   Globulin, Total 2.3 1.5 - 4.5 g/dL   Albumin/Globulin Ratio 1.7 1.2 - 2.2   Bilirubin Total 0.6 0.0 - 1.2 mg/dL   Alkaline Phosphatase 86 44 - 121 IU/L   AST 15 0 - 40 IU/L   ALT 13 0 - 32 IU/L  Lipid Panel w/o Chol/HDL Ratio  Result Value Ref Range   Cholesterol, Total 222 (H) 100 - 199 mg/dL   Triglycerides 956 0 - 149 mg/dL   HDL 79 >38 mg/dL   VLDL Cholesterol Cal 22 5 - 40 mg/dL   LDL Chol Calc (NIH) 756 (H) 0 - 99 mg/dL  Magnesium  Result Value Ref Range   Magnesium 1.8 1.6 - 2.3 mg/dL  VITAMIN D 25 Hydroxy (Vit-D Deficiency, Fractures)  Result Value Ref Range   Vit D, 25-Hydroxy 68.6 30.0 - 100.0 ng/mL  Vitamin B12  Result Value Ref Range   Vitamin B-12 574 232 - 1,245 pg/mL  C-reactive protein  Result Value Ref Range   CRP <1 0 - 10 mg/L  Sed Rate (ESR)  Result Value Ref Range   Sed Rate 19 0 - 40 mm/hr  Alpha-1-antitrypsin  Result Value Ref Range   A-1 Antitrypsin 167 101 - 187 mg/dL      Assessment & Plan:   Problem List Items Addressed This Visit       Cardiovascular and Mediastinum   Chronic venous insufficiency    Chronic, ongoing.  Recommend use of Tylenol as needed for pain, max 3000 MG total daily dosing.  Continue using compression wraps daily and massage therapy.  Will have her return to vascular as needed.      Relevant Medications   atorvastatin (LIPITOR) 20 MG tablet   Essential hypertension, benign - Primary    Chronic, stable.  BP at goal if takes Lasix, if misses this BP increases. Continue current medication regimen and adjust as needed + collaboration with cardiology -- recommend she return for follow-up.  Recommend she monitor BP at least a few mornings a week at home and document.  DASH diet at home.  Labs: CBC, CMP, TSH, urine ALB at next visit.  Refills as needed.       Relevant Medications   atorvastatin (LIPITOR) 20 MG tablet      Respiratory   Asthma in adult, moderate persistent, uncomplicated    Chronic, stable at this time.  Continue current inhaler regimen and adjust as needed.  Refills sent.  Treat exacerbation as needed.  Pollen is a trigger for her.         Other   BMI 50.0-59.9, adult [facility])    Refer to morbid obesity plan of care.      Depression    Chronic, stable.  Denies SI/HI.  Continue current medication regimen and adjust as needed.      Relevant Medications   buPROPion (WELLBUTRIN XL) 300 MG 24 hr tablet   Hyperlipidemia    Chronic, ongoing.  Did not tolerate Rosuvastatin, continue Atorvastatin.  Recheck labs next visit and discuss possible Zetia trial if elevations or monitor labs only due to advanced age and to minimize medications.      Relevant Medications   atorvastatin (LIPITOR) 20 MG tablet   Lymphedema    Chronic, ongoing.  Recommend use of Tylenol as needed for pain, max 3000 MG total daily dosing.  Avoid Ibuprofen or NSAID products.  Continue using compression wraps daily and massage therapy.        Morbid obesity (HCC)    BMI 59.28 last visit with underlying HTN and ASTHMA.  Recommended eating smaller high protein, low fat meals more frequently and exercising 30 mins a day 5 times a week with a goal of 10-15lb weight loss in the next 3 months. Patient voiced their understanding and motivation to adhere to these recommendations.        Follow up plan: Return in about 6 months (around 08/22/2023) for Annual Check-up.

## 2023-02-21 NOTE — Assessment & Plan Note (Signed)
BMI 59.28 last visit with underlying HTN and ASTHMA.  Recommended eating smaller high protein, low fat meals more frequently and exercising 30 mins a day 5 times a week with a goal of 10-15lb weight loss in the next 3 months. Patient voiced their understanding and motivation to adhere to these recommendations.

## 2023-02-21 NOTE — Assessment & Plan Note (Signed)
Chronic, stable at this time.  Continue current inhaler regimen and adjust as needed.  Refills sent.  Treat exacerbation as needed.  Pollen is a trigger for her.

## 2023-02-21 NOTE — Assessment & Plan Note (Signed)
Chronic, ongoing.  Did not tolerate Rosuvastatin, continue Atorvastatin.  Recheck labs next visit and discuss possible Zetia trial if elevations or monitor labs only due to advanced age and to minimize medications.

## 2023-02-21 NOTE — Assessment & Plan Note (Addendum)
Chronic, stable.  BP at goal if takes Lasix, if misses this BP increases. Continue current medication regimen and adjust as needed + collaboration with cardiology -- recommend she return for follow-up.  Recommend she monitor BP at least a few mornings a week at home and document.  DASH diet at home.  Labs: CBC, CMP, TSH, urine ALB at next visit.  Refills as needed.

## 2023-02-21 NOTE — Assessment & Plan Note (Signed)
Chronic, ongoing.  Recommend use of Tylenol as needed for pain, max 3000 MG total daily dosing.  Continue using compression wraps daily and massage therapy.  Will have her return to vascular as needed. 

## 2023-02-26 ENCOUNTER — Ambulatory Visit: Payer: Self-pay | Admitting: *Deleted

## 2023-02-26 ENCOUNTER — Telehealth: Payer: Self-pay | Admitting: Internal Medicine

## 2023-02-26 NOTE — Telephone Encounter (Signed)
Noted. Will await call back

## 2023-02-26 NOTE — Telephone Encounter (Signed)
Called and notified patient of [name]'s message.

## 2023-02-26 NOTE — Telephone Encounter (Signed)
  Chief Complaint: Follow up BP reading after taking her BP medication.   It was elevated prior to taking her medication.    See previous call from earlier today. Symptoms: See notes for BP readings and her question regarding her calcium level. Frequency: This morning Pertinent Negatives: Patient denies N/A Disposition: [] ED /[] Urgent Care (no appt availability in office) / [] Appointment(In office/virtual)/ []  [place] [facility]/ [] Home Care/ [] Refused Recommended Disposition /[] [place] [facility]/ [x]  Follow-up with PCP Additional Notes: Called in her BP readings as requested from an earlier call today.

## 2023-02-26 NOTE — Telephone Encounter (Signed)
  Reason for Disposition  [1] Follow-up call to recent contact AND [2] information only call, no triage required  Answer Assessment - Initial Assessment Questions 1. REASON FOR CALL or QUESTION: "What is your reason for calling today?" or "How can I best help you?" or "What question do you have that I can help answer?"     Pt had called in earlier because her BP was elevated before taking her morning BP pill.   She was instructed to call back in with her readings after taking her BP medication.  An hour after taking it the reading was 165/73.   She checked it again later and it was 146/72.   Pulse staying between 75-100.  She feels the medication is depleting during the day and at night it has worn off.   The mornings is when the readings are elevated.     She's going to check her BP during the night tonight and call in the readings tomorrow.  She also wanted to know if an elevated calcium level would cause her BP to be elevated?  Protocols used: Information Only Call - No Triage-A-AH

## 2023-02-26 NOTE — Telephone Encounter (Signed)
Pt's daughter Tresa Endo will be calling to make an appointment to be seen to get a new cpap order. She was last seen in 2020

## 2023-02-26 NOTE — Telephone Encounter (Signed)
  Chief Complaint: hypertension Symptoms: recent change in medication- discontinued Nebivolol due to decreased pulse, patient has noticed BP is rising- she has not taken any of her BP medication today- advised her to take it- she will send message with reading in 1-2 hours- but she reports she has seen elevation since she stopped medication on Friday Frequency: reading from today- but patient reports it has been elevated for a few days- pulse is good now Pertinent Negatives: Patient denies blurred vision, chest pain, difficulty breathing, headache, weakness Disposition: [] ED /[] Urgent Care (no appt availability in office) / [] Appointment(In office/virtual)/ []  [place] [facility]/ [] Home Care/ [] Refused Recommended Disposition /[] [place] [facility]/ [x]  Follow-up with PCP Additional Notes: Patient had VV on Friday and Nebivolol was discontinued- pulse is good- but BP is elevated- patient would like to have advice from provider

## 2023-02-26 NOTE — Telephone Encounter (Signed)
Reason for Disposition  [1] Systolic BP  >= 180 OR Diastolic >= 110 AND [2] missed most recent dose of blood pressure medication  Answer Assessment - Initial Assessment Questions 1. BLOOD PRESSURE: "What is the blood pressure?" "Did you take at least two measurements 5 minutes apart?"     183/84, 162/113, P 78 2. ONSET: "When did you take your blood pressure?"     Today- 10:10 am, 10:28 3. HOW: "How did you take your blood pressure?" (e.g., automatic home BP monitor, visiting nurse)     Automatic cuff- arm 4. HISTORY: "Do you have a history of high blood pressure?"     Yes 5. MEDICINES: "Are you taking any medicines for blood pressure?" "Have you missed any doses recently?"     Recent change in medication- discontinued medication to correct pulse  6. OTHER SYMPTOMS: "Do you have any symptoms?" (e.g., blurred vision, chest pain, difficulty breathing, headache, weakness)     no  Protocols used: Blood Pressure - High-A-AH

## 2023-03-03 ENCOUNTER — Ambulatory Visit: Payer: Self-pay

## 2023-03-03 DIAGNOSIS — Z7409 Other reduced mobility: Secondary | ICD-10-CM

## 2023-03-03 NOTE — Telephone Encounter (Signed)
  Chief Complaint: bilateral soles of feet reddened Symptoms: "deep"chills Frequency: Sat Pertinent Negatives: Patient denies increased swelling Disposition: [] ED /[] Urgent Care (no appt availability in office) / [] Appointment(In office/virtual)/ []  [place] [facility]/ [] Home Care/ [] Refused Recommended Disposition /[] [place] [facility]/ [x]  Follow-up with PCP Additional Notes: refused to make appt with a provider other than [name]. No appts until Friday.  Reason for Disposition  [1] Red area or streak [2] large (> 2 in. or 5 cm)  Answer Assessment - Initial Assessment Questions 1. ONSET: "When did the swelling start?" (e.g., minutes, hours, days)     Redness Sat  2. LOCATION: "What part of the leg is swollen?"  "Are both legs swollen or just one leg?"     Both  3. SEVERITY: "How bad is the swelling?" (e.g., localized; mild, moderate, severe)   - Localized: Small area of swelling localized to one leg.   - MILD pedal edema: Swelling limited to foot and ankle, pitting edema < 1/4 inch (6 mm) deep, rest and elevation eliminate most or all swelling.   - MODERATE edema: Swelling of lower leg to knee, pitting edema > 1/4 inch (6 mm) deep, rest and elevation only partially reduce swelling.   - SEVERE edema: Swelling extends above knee, facial or hand swelling present.      mild 4. REDNESS: "Does the swelling look red or infected?"     Yes soles red and painful  5. PAIN: "Is the swelling painful to touch?" If Yes, ask: "How painful is it?"   (Scale 1-10; mild, moderate or severe)     no 6. FEVER: "Do you have a fever?" If Yes, ask: "What is it, how was it measured, and when did it start?"      chills 7. CAUSE: "What do you think is causing the leg swelling?"     Has swelling to feet at baseline  9. RECURRENT SYMPTOM: "Have you had leg swelling before?" If Yes, ask: "When was the last time?" "What happened that time?"     yes 10. OTHER SYMPTOMS: "Do you have any other symptoms?"  (e.g., chest pain, difficulty breathing)       no  Protocols used: Leg Swelling and Edema-A-AH

## 2023-03-03 NOTE — Telephone Encounter (Signed)
Called patient to get her scheduled however she stated that she can not make it into the office.  She asked that you get a nurse to come out to her home.  She said that you both had discussed it however she was not for it then.  Now she said that she has no choice while on the phone she started crying.

## 2023-03-04 ENCOUNTER — Encounter: Payer: Self-pay | Admitting: Nurse Practitioner

## 2023-03-04 ENCOUNTER — Telehealth (INDEPENDENT_AMBULATORY_CARE_PROVIDER_SITE_OTHER): Payer: Medicare PPO | Admitting: Nurse Practitioner

## 2023-03-04 VITALS — BP 158/61 | HR 60

## 2023-03-04 DIAGNOSIS — Z7409 Other reduced mobility: Secondary | ICD-10-CM | POA: Diagnosis not present

## 2023-03-04 DIAGNOSIS — I10 Essential (primary) hypertension: Secondary | ICD-10-CM | POA: Diagnosis not present

## 2023-03-04 DIAGNOSIS — I890 Lymphedema, not elsewhere classified: Secondary | ICD-10-CM

## 2023-03-04 MED ORDER — DOXYCYCLINE HYCLATE 100 MG PO TABS: 100.0000 mg | ORAL_TABLET | Freq: Two times a day (BID) | ORAL | 0 refills | Status: AC

## 2023-03-04 NOTE — Addendum Note (Signed)
Addended by: Aura Dials T on: 03/04/2023 12:52 PM   Modules accepted: Orders

## 2023-03-04 NOTE — Progress Notes (Signed)
BP (!) 158/61   Pulse 60   LMP  (LMP Unknown)    Subjective:    Patient ID: [name], female    DOB: 1940-09-18, 82 y.o.   MRN: [id number]  HPI: [name] is a 82 y.o. female  Chief Complaint  Patient presents with   Hypertension   Virtual Visit via Video Note  I connected with DRUSILLA WAMPOLE on 03/04/23 at  2:20 PM EDT by a video enabled telemedicine application and verified that I am speaking with the correct person using two identifiers.  Location: Patient: home Provider: work   I discussed the limitations of evaluation and management by telemedicine and the availability of in person appointments. The patient expressed understanding and agreed to proceed.  I discussed the assessment and treatment plan with the patient. The patient was provided an opportunity to ask questions and all were answered. The patient agreed with the plan and demonstrated an understanding of the instructions.   The patient was advised to call back or seek an in-person evaluation if the symptoms worsen or if the condition fails to improve as anticipated.  I provided 21 minutes of non-face-to-face time during this encounter.   Marjie Skiff, NP   HYPERTENSION without Chronic Kidney Disease Taking Nebivolol, Avapro, and Lasix.  Son-in-law is with her all day in home.  Notices when she gets up to walk and returns to chair her blood pressure is elevated, but after resting a few minutes her blood pressure is normal.  Is noticing some cellulitis to lower legs with her lymphedema, wonders if she needs abx therapy.  Is no longer able to leave home to attend visits in primary care office due to mobility. Hypertension status: stable  Satisfied with current treatment? yes Duration of hypertension: chronic BP monitoring frequency:  daily BP range:  BP medication side effects:  no Medication compliance: good compliance Aspirin: yes Recurrent headaches: no Visual changes: no Palpitations:  no Dyspnea: no Chest pain: no Lower extremity edema: no Dizzy/lightheaded: no   Relevant past medical, surgical, family and social history reviewed and updated as indicated. Interim medical history since our last visit reviewed. Allergies and medications reviewed and updated.  Review of Systems  Constitutional:  Negative for activity change, appetite change, chills, fatigue and fever.  HENT: Negative.    Eyes:  Negative for pain and visual disturbance.  Respiratory:  Negative for cough, chest tightness, shortness of breath and wheezing.   Cardiovascular:  Positive for leg swelling. Negative for chest pain and palpitations.  Gastrointestinal: Negative.   Neurological:  Negative for dizziness, numbness and headaches.  Psychiatric/Behavioral: Negative.     Per HPI unless specifically indicated above     Objective:    BP (!) 158/61   Pulse 60   LMP  (LMP Unknown)   Wt Readings from Last 3 Encounters:  07/15/22 (!) 340 lb (154.2 kg)  06/04/21 (!) 340 lb (154.2 kg)  11/29/20 (!) 340 lb (154.2 kg)    Physical Exam Vitals and nursing note reviewed.  Constitutional:      General: She is awake. She is not in acute distress.    Appearance: Normal appearance. She is well-developed and well-groomed. She is obese. She is not ill-appearing or toxic-appearing.  HENT:     Head: Normocephalic.     Right Ear: Hearing normal.     Left Ear: Hearing normal.  Eyes:     General: Lids are normal.  Right eye: No discharge.        Left eye: No discharge.     Conjunctiva/sclera: Conjunctivae normal.  Pulmonary:     Effort: Pulmonary effort is normal. No accessory muscle usage or respiratory distress.  Musculoskeletal:     Cervical back: Normal range of motion.  Neurological:     Mental Status: She is alert and oriented to person, place, and time.  Psychiatric:        Attention and Perception: Attention normal.        Mood and Affect: Mood normal.        Behavior: Behavior normal.  Behavior is cooperative.        Thought Content: Thought content normal.        Judgment: Judgment normal.     Results for orders placed or performed in visit on 08/21/22  T4, free  Result Value Ref Range   Free T4 1.49 0.82 - 1.77 ng/dL  Thyroid peroxidase antibody  Result Value Ref Range   Thyroperoxidase Ab SerPl-aCnc <9 0 - 34 IU/mL  TSH  Result Value Ref Range   TSH 3.360 0.450 - 4.500 uIU/mL  CBC with Differential/Platelet  Result Value Ref Range   WBC 4.7 3.4 - 10.8 x10E3/uL   RBC 4.71 3.77 - 5.28 x10E6/uL   Hemoglobin 14.3 11.1 - 15.9 g/dL   Hematocrit 40.9 81.1 - 46.6 %   MCV 92 79 - 97 fL   MCH 30.4 26.6 - 33.0 pg   MCHC 32.9 31.5 - 35.7 g/dL   RDW 91.4 78.2 - 95.6 %   Platelets 234 150 - 450 x10E3/uL   Neutrophils 56 Not Estab. %   Lymphs 35 Not Estab. %   Monocytes 9 Not Estab. %   Eos 0 Not Estab. %   Basos 0 Not Estab. %   Neutrophils Absolute 2.6 1.4 - 7.0 x10E3/uL   Lymphocytes Absolute 1.7 0.7 - 3.1 x10E3/uL   Monocytes Absolute 0.4 0.1 - 0.9 x10E3/uL   EOS (ABSOLUTE) 0.0 0.0 - 0.4 x10E3/uL   Basophils Absolute 0.0 0.0 - 0.2 x10E3/uL   Immature Granulocytes 0 Not Estab. %   Immature Grans (Abs) 0.0 0.0 - 0.1 x10E3/uL  Comprehensive metabolic panel  Result Value Ref Range   Glucose 90 70 - 99 mg/dL   BUN 19 8 - 27 mg/dL   Creatinine, Ser 2.13 0.57 - 1.00 mg/dL   eGFR 61 >08 MV/HQI/6.96   BUN/Creatinine Ratio 20 12 - 28   Sodium 139 134 - 144 mmol/L   Potassium 4.7 3.5 - 5.2 mmol/L   Chloride 103 96 - 106 mmol/L   CO2 19 (L) 20 - 29 mmol/L   Calcium 10.2 8.7 - 10.3 mg/dL   Total Protein 6.3 6.0 - 8.5 g/dL   Albumin 4.0 3.7 - 4.7 g/dL   Globulin, Total 2.3 1.5 - 4.5 g/dL   Albumin/Globulin Ratio 1.7 1.2 - 2.2   Bilirubin Total 0.6 0.0 - 1.2 mg/dL   Alkaline Phosphatase 86 44 - 121 IU/L   AST 15 0 - 40 IU/L   ALT 13 0 - 32 IU/L  Lipid Panel w/o Chol/HDL Ratio  Result Value Ref Range   Cholesterol, Total 222 (H) 100 - 199 mg/dL   Triglycerides  295 0 - 149 mg/dL   HDL 79 >28 mg/dL   VLDL Cholesterol Cal 22 5 - 40 mg/dL   LDL Chol Calc (NIH) 413 (H) 0 - 99 mg/dL  Magnesium  Result Value Ref Range  Magnesium 1.8 1.6 - 2.3 mg/dL  VITAMIN D 25 Hydroxy (Vit-D Deficiency, Fractures)  Result Value Ref Range   Vit D, 25-Hydroxy 68.6 30.0 - 100.0 ng/mL  Vitamin B12  Result Value Ref Range   Vitamin B-12 574 232 - 1,245 pg/mL  C-reactive protein  Result Value Ref Range   CRP <1 0 - 10 mg/L  Sed Rate (ESR)  Result Value Ref Range   Sed Rate 19 0 - 40 mm/hr  Alpha-1-antitrypsin  Result Value Ref Range   A-1 Antitrypsin 167 101 - 187 mg/dL      Assessment & Plan:   Problem List Items Addressed This Visit       Cardiovascular and Mediastinum   Essential hypertension, benign - Primary    Chronic, stable.  BP at goal if takes Lasix, if misses this BP increases or if is active. Continue current medication regimen and adjust as needed + collaboration with cardiology -- recommend she return for follow-up.  Recommend she monitor BP at least a few mornings a week at home and document.  DASH diet at home.  Labs: CBC, CMP, TSH, urine ALB at next visit.  Refills as needed.       Relevant Medications   nebivolol (BYSTOLIC) 5 MG tablet     Other   Lymphedema    Chronic, ongoing.  Recommend use of Tylenol as needed for pain, max 3000 MG total daily dosing.  Avoid Ibuprofen or NSAID products.  Continue using compression wraps daily and massage therapy.  Currently reports some cellulitis present, will send in Doxycycline which works well for her.      Mobility impaired    Have placed urgent referral to Marletta Lor NP to start home based primary care.        Follow up plan: Return if symptoms worsen or fail to improve.

## 2023-03-04 NOTE — Assessment & Plan Note (Signed)
Chronic, ongoing.  Recommend use of Tylenol as needed for pain, max 3000 MG total daily dosing.  Avoid Ibuprofen or NSAID products.  Continue using compression wraps daily and massage therapy.  Currently reports some cellulitis present, will send in Doxycycline which works well for her.

## 2023-03-04 NOTE — Assessment & Plan Note (Signed)
Chronic, stable.  BP at goal if takes Lasix, if misses this BP increases or if is active. Continue current medication regimen and adjust as needed + collaboration with cardiology -- recommend she return for follow-up.  Recommend she monitor BP at least a few mornings a week at home and document.  DASH diet at home.  Labs: CBC, CMP, TSH, urine ALB at next visit.  Refills as needed.

## 2023-03-04 NOTE — Assessment & Plan Note (Signed)
Have placed urgent referral to Marletta Lor NP to start home based primary care.

## 2023-03-04 NOTE — Patient Instructions (Signed)
Lymphedema  Lymphedema is swelling that happens when an abnormal amount of lymph collects in the soft tissues under your skin. Lymph is fluid that moves through your lymphatic system. This system: Is part of your body's defense system, also called your immune system. Filters germs and waste from tissues in your body to your bloodstream. Lymphedema happens when your lymphatic system is blocked. This keeps lymph from draining as it should and leads to swelling. What are the causes? The cause of lymphedema depends on which type you have. Primary lymphedema is when you're born without lymph vessels or with lymph vessels that aren't normal. Secondary lymphedema is more common. It happens when lymph vessels are blocked or damaged from: Infection. Injury. Radiation therapy. Cancer. Scar tissue that forms. Surgery. What are the signs or symptoms? A swollen arm, leg, feet, toes, or fingers. A heavy or tight feeling in the swollen area. Skin that turns red near the swollen area. Not being able to move your arm or leg. Your arm or leg is sensitive to touch. Discomfort in your arm or leg. How is this diagnosed? Lymphedema may be diagnosed based on: Your symptoms and medical history. A physical exam. Bioimpedance spectroscopy. This test uses painless electrical currents. They help measure fluid levels in your body. Imaging tests, such as: MRI or CT scan. Duplex ultrasound. This test uses sound waves to make pictures on a screen. The pictures show your blood vessels and blood flow. Lymphoscintigraphy. In this test, a low dose of radioactive substance is given through a needle that goes through your skin. The substance traces the flow of lymph through your lymph vessels. Lymphangiography. In this test, a contrast dye is put into your lymph vessel. The dye helps show if the vessel is blocked. How is this treated?  If another condition is causing your lymphedema, that condition will be treated.  For example, antibiotics may be used to treat infection. Treatment for lymphedema depends on the cause. Treatment may include: Complete decongestive therapy (CDT). This lowers fluid buildup. CDT includes: Pressure (compression) wrapping of the area. Manual lymph drainage. This helps lymph drain out of your arm or leg. Certain exercises. These help fluid move out of your arm or leg. Compression. This puts pressure on your arm or leg to lower swelling. It includes: Compression stockings or sleeves. Special bandage wraps. Surgery. This is normally done only for severe cases that don't get better with other treatments. Follow these instructions at home: Self-care Your swollen area is more likely to get hurt or infected. To help prevent infection: Keep the area clean and dry. Use creams or lotions that your health care provider says are okay. These keep your skin moist. Protect your skin from cuts. Use gloves when you cook or garden. Do not walk barefoot. If you shave the area, use an Neurosurgeon. Do not wear tight clothes, shoes, or jewelry. Eat a healthy diet. Eat a lot of fruits and vegetables. Activity Do exercises as told by your provider. Do not sit with your legs crossed. When you can, keep the swollen leg or foot raised above the level of your heart. Avoid using an arm with lymphedema to carry things. General instructions Wear compression stockings or sleeves as told by your provider. Note any changes in size of the swollen arm or leg. You may be told to measure it at set times and track the results. Take over-the-counter and prescription medicines only as told by your provider. If you were prescribed antibiotics, use them  as told by your provider. Do not stop using the antibiotic even if you start to feel better or if your condition improves. Do not use heating pads or ice packs on the swollen area. Avoid having your swollen arm or leg used for: Blood draws. IVs. Blood  pressure checks. Contact a health care provider if: You get new swelling in your arm or leg all of a sudden. Fluid leaks from the skin of your swollen arm or leg. You have a cut that doesn't heal. The swollen area hurts or turns red. You get purple spots, a rash, blisters, or sores on your swollen arm or leg. You have a fever or chills. This information is not intended to replace advice given to you by your health care provider. Make sure you discuss any questions you have with your health care provider. Document Revised: 07/31/2022 Document Reviewed: 07/31/2022 Elsevier Patient Education  2024 [employer].

## 2023-03-04 NOTE — Telephone Encounter (Signed)
Yes ma'am from what I can understand with the conversation that we had.  I can call her a little later in the morning to clarify if you would like me to.

## 2023-03-05 NOTE — Telephone Encounter (Signed)
Called and informed patient that provider put in for a referral for primary care in home care.  Stated that she should be receiving a phone call soon to help her get started.  Pt verbalized understanding.

## 2023-03-07 ENCOUNTER — Ambulatory Visit: Payer: Medicare PPO | Admitting: Nurse Practitioner

## 2023-03-11 ENCOUNTER — Other Ambulatory Visit: Payer: Self-pay | Admitting: Nurse Practitioner

## 2023-03-11 NOTE — Telephone Encounter (Signed)
Medication Refill - Medication: Triamcinolone Acetonide in a jar 454 grams y Has the patient contacted their pharmacy? Yes.   (Agent: If no, request that the patient contact the pharmacy for the refill. If patient does not wish to contact the pharmacy document the reason why and proceed with request.) (Agent: If yes, when and what did the pharmacy advise?)  Preferred Pharmacy (with phone number or street name): [facility] [address] Has the patient been seen for an appointment in the last year OR does the patient have an upcoming appointment? Yes.    Agent: Please be advised that RX refills may take up to 3 business days. We ask that you follow-up with your pharmacy.

## 2023-03-12 NOTE — Telephone Encounter (Signed)
Requested medication (s) are due for refill today: NO  Requested medication (s) are on the active medication list: YES  Last refill:  02/11/23 454 g 5 RF  Future visit scheduled: no  Notes to clinic:  med not delegated to NT to refuse/refill   Requested Prescriptions  Pending Prescriptions Disp Refills   triamcinolone cream (KENALOG) 0.1 % 454 g 5    Sig: APPLY TO THE AFFECTED AREA(S) TWICE DAILY     Not Delegated - Dermatology:  Corticosteroids Failed - 03/11/2023  1:32 PM      Failed - This refill cannot be delegated      Passed - Valid encounter within last 12 months    Recent Outpatient Visits           1 week ago Essential hypertension, benign   [place] [facility] [place], Corrie Dandy T, NP   2 weeks ago Essential hypertension, benign   [place] [facility] [place], [place] T, NP   6 months ago Morbid obesity (HCC)   [place] [facility] [place], [place] T, NP   8 months ago Moderate persistent asthma with exacerbation   [place] [facility] [place], [place] T, NP   8 months ago Essential hypertension, benign   [place] [facility] [place], Dorie Rank, NP

## 2023-03-14 ENCOUNTER — Other Ambulatory Visit: Payer: Self-pay | Admitting: Nurse Practitioner

## 2023-03-17 MED ORDER — TRIAMCINOLONE ACETONIDE 0.1 % EX CREA: TOPICAL_CREAM | CUTANEOUS | 5 refills | Status: AC

## 2023-03-17 NOTE — Telephone Encounter (Signed)
Requested Prescriptions  Pending Prescriptions Disp Refills   fluticasone-salmeterol (WIXELA INHUB) 250-50 MCG/ACT AEPB [Pharmacy Med Name: WIXELA INHUB DISKUS 250/50MCG 60S] 60 each 0    Sig: INHALE 1 PUFF INTO THE LUNGS TWICE DAILY     Pulmonology:  Combination Products Passed - 03/14/2023 12:08 PM      Passed - Valid encounter within last 12 months    Recent Outpatient Visits           1 week ago Essential hypertension, benign   [place] [facility] [place], Daniel T, NP   3 weeks ago Essential hypertension, benign   [place] [facility] [place], [place] T, NP   6 months ago Morbid obesity (HCC)   [place] [facility] [place], [place] T, NP   8 months ago Moderate persistent asthma with exacerbation   [place] [facility] [place], Corrie Dandy T, NP   8 months ago Essential hypertension, benign    [facility] [place], Dorie Rank, NP

## 2023-03-26 ENCOUNTER — Encounter: Payer: Self-pay | Admitting: Nurse Practitioner

## 2023-06-14 NOTE — Patient Instructions (Signed)

## 2023-06-17 ENCOUNTER — Encounter: Payer: Self-pay | Admitting: Nurse Practitioner

## 2023-06-17 ENCOUNTER — Telehealth: Payer: Medicare PPO | Admitting: Nurse Practitioner

## 2023-06-17 VITALS — BP 131/77 | HR 90 | Ht 63.5 in

## 2023-06-17 DIAGNOSIS — Z6841 Body Mass Index (BMI) 40.0 and over, adult: Secondary | ICD-10-CM

## 2023-06-17 DIAGNOSIS — G4733 Obstructive sleep apnea (adult) (pediatric): Secondary | ICD-10-CM

## 2023-06-17 DIAGNOSIS — E782 Mixed hyperlipidemia: Secondary | ICD-10-CM

## 2023-06-17 DIAGNOSIS — G4700 Insomnia, unspecified: Secondary | ICD-10-CM

## 2023-06-17 DIAGNOSIS — I890 Lymphedema, not elsewhere classified: Secondary | ICD-10-CM

## 2023-06-17 DIAGNOSIS — I700 Atherosclerosis of aorta: Secondary | ICD-10-CM

## 2023-06-17 DIAGNOSIS — E785 Hyperlipidemia, unspecified: Secondary | ICD-10-CM

## 2023-06-17 DIAGNOSIS — J4540 Moderate persistent asthma, uncomplicated: Secondary | ICD-10-CM

## 2023-06-17 DIAGNOSIS — Z7409 Other reduced mobility: Secondary | ICD-10-CM

## 2023-06-17 DIAGNOSIS — I10 Essential (primary) hypertension: Secondary | ICD-10-CM | POA: Diagnosis not present

## 2023-06-17 DIAGNOSIS — F5101 Primary insomnia: Secondary | ICD-10-CM

## 2023-06-17 DIAGNOSIS — E559 Vitamin D deficiency, unspecified: Secondary | ICD-10-CM

## 2023-06-17 DIAGNOSIS — E538 Deficiency of other specified B group vitamins: Secondary | ICD-10-CM

## 2023-06-17 DIAGNOSIS — F3340 Major depressive disorder, recurrent, in remission, unspecified: Secondary | ICD-10-CM

## 2023-06-17 DIAGNOSIS — F32A Depression, unspecified: Secondary | ICD-10-CM | POA: Diagnosis not present

## 2023-06-17 MED ORDER — ALBUTEROL SULFATE (2.5 MG/3ML) 0.083% IN NEBU: 2.5000 mg | INHALATION_SOLUTION | Freq: Four times a day (QID) | RESPIRATORY_TRACT | 3 refills | Status: AC | PRN

## 2023-06-17 MED ORDER — NEBIVOLOL HCL 5 MG PO TABS: 5.0000 mg | ORAL_TABLET | Freq: Every day | ORAL | 2 refills | Status: DC

## 2023-06-17 MED ORDER — BENZONATATE 100 MG PO CAPS: 100.0000 mg | ORAL_CAPSULE | Freq: Three times a day (TID) | ORAL | 2 refills | Status: DC | PRN

## 2023-06-17 MED ORDER — TRAZODONE HCL 50 MG PO TABS: ORAL_TABLET | ORAL | 4 refills | Status: AC

## 2023-06-17 MED ORDER — IRBESARTAN 300 MG PO TABS: ORAL_TABLET | ORAL | 4 refills | Status: DC

## 2023-06-17 MED ORDER — BUPROPION HCL ER (XL) 300 MG PO TB24: 300.0000 mg | ORAL_TABLET | Freq: Every day | ORAL | 4 refills | Status: DC

## 2023-06-17 MED ORDER — FUROSEMIDE 40 MG PO TABS: ORAL_TABLET | ORAL | 4 refills | Status: DC

## 2023-06-17 MED ORDER — ALBUTEROL SULFATE HFA 108 (90 BASE) MCG/ACT IN AERS: 2.0000 | INHALATION_SPRAY | Freq: Four times a day (QID) | RESPIRATORY_TRACT | 4 refills | Status: DC | PRN

## 2023-06-17 MED ORDER — SPIRIVA RESPIMAT 1.25 MCG/ACT IN AERS: 2.0000 | INHALATION_SPRAY | Freq: Every day | RESPIRATORY_TRACT | 3 refills | Status: DC

## 2023-06-17 MED ORDER — ATORVASTATIN CALCIUM 20 MG PO TABS: 20.0000 mg | ORAL_TABLET | Freq: Every day | ORAL | 4 refills | Status: AC

## 2023-06-17 MED ORDER — OMEPRAZOLE 20 MG PO CPDR: DELAYED_RELEASE_CAPSULE | ORAL | 4 refills | Status: DC

## 2023-06-17 MED ORDER — FLUTICASONE-SALMETEROL 250-50 MCG/ACT IN AEPB: 1.0000 | INHALATION_SPRAY | Freq: Two times a day (BID) | RESPIRATORY_TRACT | 4 refills | Status: DC

## 2023-06-17 NOTE — Assessment & Plan Note (Signed)
BMI 59.28 last visit with underlying HTN and ASTHMA.  Recommended eating smaller high protein, low fat meals more frequently and exercising 30 mins a day 5 times a week with a goal of 10-15lb weight loss in the next 3 months. Patient voiced their understanding and motivation to adhere to these recommendations.

## 2023-06-17 NOTE — Assessment & Plan Note (Signed)
Chronic, ongoing.  Benefit from Trazodone at night as needed.  Continue current medication regimen and adjust as needed.  Continue focus on sleep hygiene.

## 2023-06-17 NOTE — Assessment & Plan Note (Signed)
She is homebound and unable to attend in office visits.  Did place referral in past to homebound provider, however she wishes to stay with PCP at present.  She agrees to 6 month virtual visits + outpatient labs.  Discussed with her that if gets to time where she can not perform virtual visits or outpatient labs, then will recommend homebound provider.

## 2023-06-17 NOTE — Assessment & Plan Note (Signed)
Ongoing and stable.  Continue daily supplement and recheck level outpatient.

## 2023-06-17 NOTE — Assessment & Plan Note (Signed)
Refer to morbid obesity plan of care.

## 2023-06-17 NOTE — Assessment & Plan Note (Signed)
Chronic, ongoing.  Did not tolerate Rosuvastatin, continue Atorvastatin.  Recheck labs outpatient and discuss possible Zetia trial if elevations or monitor labs only due to advanced age and to minimize medications.

## 2023-06-17 NOTE — Assessment & Plan Note (Addendum)
Ongoing, continue daily supplement and recheck B12 level outpatient.

## 2023-06-17 NOTE — Progress Notes (Signed)
Appointment has been made

## 2023-06-17 NOTE — Progress Notes (Signed)
BP 131/77   Pulse 90   Ht 5' 3.5" (1.613 m)   LMP  (LMP Unknown)   SpO2 98%   BMI 59.28 kg/m    Subjective:    Patient ID: [name], female    DOB: 11/26/1940, 83 y.o.   MRN: [id number]  HPI: [name] is a 83 y.o. female  Chief Complaint  Patient presents with   Hypertension   Hyperlipidemia   Depression   COPD   Virtual Visit via Video Note  I connected with DEMESHIA SHERBURNE on 06/17/23 at 10:00 AM EST by a video enabled telemedicine application and verified that I am speaking with the correct person using two identifiers. Due to mobility issues is unable to leave home for visits.  She does agree with having annual labs outpatient.  Prefers to stay with current PCP at this time but is aware if gets to where she can not perform virtual visits or obtain annual labs will need to switch over to a home bound provider.  Location: Patient: home Provider: work   I discussed the limitations of evaluation and management by telemedicine and the availability of in person appointments. The patient expressed understanding and agreed to proceed.  I discussed the assessment and treatment plan with the patient. The patient was provided an opportunity to ask questions and all were answered. The patient agreed with the plan and demonstrated an understanding of the instructions.   The patient was advised to call back or seek an in-person evaluation if the symptoms worsen or if the condition fails to improve as anticipated.  I provided 21 minutes of non-face-to-face time during this encounter.   Marjie Skiff, NP   HYPERTENSION / HYPERLIPIDEMIA Continues on Furosemide (takes only as needed), ASA, Avapro, Bystolic, and Atorvastatin. Saw  cardiology in past, last visit was 11/29/20.   Chronic lymphedema. Taking Tylenol twice a day.  Uses compression pumps at home.  Vascular seen in past, last 06/07/19, has not returned.  Does report some incontinence issues, mainly when out in the car with  the movement.  Using under pads.  Continues on B12 and D for history of low levels. Satisfied with current treatment? yes Duration of hypertension: chronic BP monitoring frequency: not in last month BP range: around 130/70 range BP medication side effects: no Duration of hyperlipidemia: chronic Cholesterol medication side effects: no Cholesterol supplements: none Medication compliance: good compliance Aspirin: no Recent stressors: no Recurrent headaches: no Visual changes: no Palpitations: no Dyspnea: occasional if moves around a lot Chest pain: no Lower extremity edema: baseline with lymphedema Dizzy/lightheaded: no  COPD Uses Wixela & Spiriva and Albuterol as needed + Tessalon for cough as needed.  Has a lot of congestion in morning and sinuses are bothering her, this has been present since around Thanksgiving.  Seems to be getting worse.  Has not been taking Flonase or Zyrtec.  Uses CPAP 100% at home. Saw Dr. Belia Heman with pulmonary in past, last 10/15/2018, has not returned since. COPD status: stable Satisfied with current treatment?: yes Oxygen use: no Dyspnea frequency: occasional at baseline Cough frequency: no Rescue inhaler frequency: daily Limitation of activity: no Productive cough: none Last Spirometry: with pulmonary Pneumovax: Up to Date Influenza: Up to Date   DEPRESSION Takes Wellbutrin and Trazodone 50 MG at bedtime as needed. Used to be able to handle everything, but can no longer do this which is frustration.  Daughter and family live with her, which helps her with daily tasks.  Mood status: stable Satisfied with current treatment?: yes Symptom severity: mild  Duration of current treatment : chronic Side effects: no Medication compliance: good compliance Psychotherapy/counseling: none Depressed mood: no Anxious mood: no Anhedonia: no Significant weight loss or gain: no Insomnia: yes hard to fall asleep Fatigue: no Feelings of worthlessness or guilt:  no Impaired concentration/indecisiveness: no Suicidal ideations: no Hopelessness: no Crying spells: no    06/17/2023   10:07 AM 02/21/2023    2:07 PM 08/21/2022   11:10 AM 07/15/2022    9:22 AM 07/04/2022   10:38 AM  Depression screen PHQ 2/9  Decreased Interest 0 0 0 0 0  Down, Depressed, Hopeless 0 0 0 0 0  PHQ - 2 Score 0 0 0 0 0  Altered sleeping 1 1 0 0 0  Tired, decreased energy 2 1 2  0 2  Change in appetite 0 0 0 0 0  Feeling bad or failure about yourself  0 0 0 0 0  Trouble concentrating 0 0 0 0 0  Moving slowly or fidgety/restless 0 0 0 0 0  Suicidal thoughts 0 0 0 0 0  PHQ-9 Score 3 2 2  0 2  Difficult doing work/chores Not difficult at all Not difficult at all Not difficult at all Not difficult at all Not difficult at all       06/17/2023   10:08 AM 02/21/2023    2:08 PM 08/21/2022   11:10 AM 07/04/2022   10:39 AM  GAD 7 : Generalized Anxiety Score  Nervous, Anxious, on Edge 1 0 0 0  Control/stop worrying 3 0 0 1  Worry too much - different things 3 0 0 3  Trouble relaxing 0 0 0 0  Restless 0 0 0 0  Easily annoyed or irritable 0 0 0 0  Afraid - awful might happen 0 0 0 0  Total GAD 7 Score 7 0 0 4  Anxiety Difficulty Not difficult at all Not difficult at all Not difficult at all Not difficult at all   Relevant past medical, surgical, family and social history reviewed and updated as indicated. Interim medical history since our last visit reviewed. Allergies and medications reviewed and updated.  Review of Systems  Constitutional:  Negative for activity change, appetite change, chills, fatigue and fever.  HENT: Negative.    Eyes:  Negative for pain and visual disturbance.  Respiratory:  Negative for cough, chest tightness, shortness of breath and wheezing.   Cardiovascular:  Negative for chest pain, palpitations and leg swelling.  Gastrointestinal: Negative.   Neurological:  Negative for dizziness, numbness and headaches.  Psychiatric/Behavioral: Negative.      Per HPI unless specifically indicated above     Objective:    BP 131/77   Pulse 90   Ht 5' 3.5" (1.613 m)   LMP  (LMP Unknown)   SpO2 98%   BMI 59.28 kg/m   Wt Readings from Last 3 Encounters:  07/15/22 (!) 340 lb (154.2 kg)  06/04/21 (!) 340 lb (154.2 kg)  11/29/20 (!) 340 lb (154.2 kg)    Physical Exam Vitals and nursing note reviewed.  Constitutional:      General: She is awake. She is not in acute distress.    Appearance: She is well-developed and well-groomed. She is obese. She is not ill-appearing or toxic-appearing.  HENT:     Head: Normocephalic.     Right Ear: Hearing normal.     Left Ear: Hearing normal.  Eyes:     General:  Lids are normal.        Right eye: No discharge.        Left eye: No discharge.     Conjunctiva/sclera: Conjunctivae normal.  Pulmonary:     Effort: Pulmonary effort is normal. No accessory muscle usage or respiratory distress.  Musculoskeletal:     Cervical back: Normal range of motion.  Neurological:     Mental Status: She is alert and oriented to person, place, and time.  Psychiatric:        Attention and Perception: Attention normal.        Mood and Affect: Mood normal.        Behavior: Behavior normal. Behavior is cooperative.        Thought Content: Thought content normal.        Judgment: Judgment normal.    Results for orders placed or performed in visit on 08/21/22  T4, free   Collection Time: 08/21/22 11:14 AM  Result Value Ref Range   Free T4 1.49 0.82 - 1.77 ng/dL  Thyroid peroxidase antibody   Collection Time: 08/21/22 11:14 AM  Result Value Ref Range   Thyroperoxidase Ab SerPl-aCnc <9 0 - 34 IU/mL  TSH   Collection Time: 08/21/22 11:14 AM  Result Value Ref Range   TSH 3.360 0.450 - 4.500 uIU/mL  CBC with Differential/Platelet   Collection Time: 08/21/22 11:14 AM  Result Value Ref Range   WBC 4.7 3.4 - 10.8 x10E3/uL   RBC 4.71 3.77 - 5.28 x10E6/uL   Hemoglobin 14.3 11.1 - 15.9 g/dL   Hematocrit 82.9 56.2 -  46.6 %   MCV 92 79 - 97 fL   MCH 30.4 26.6 - 33.0 pg   MCHC 32.9 31.5 - 35.7 g/dL   RDW 13.0 86.5 - 78.4 %   Platelets 234 150 - 450 x10E3/uL   Neutrophils 56 Not Estab. %   Lymphs 35 Not Estab. %   Monocytes 9 Not Estab. %   Eos 0 Not Estab. %   Basos 0 Not Estab. %   Neutrophils Absolute 2.6 1.4 - 7.0 x10E3/uL   Lymphocytes Absolute 1.7 0.7 - 3.1 x10E3/uL   Monocytes Absolute 0.4 0.1 - 0.9 x10E3/uL   EOS (ABSOLUTE) 0.0 0.0 - 0.4 x10E3/uL   Basophils Absolute 0.0 0.0 - 0.2 x10E3/uL   Immature Granulocytes 0 Not Estab. %   Immature Grans (Abs) 0.0 0.0 - 0.1 x10E3/uL  Comprehensive metabolic panel   Collection Time: 08/21/22 11:14 AM  Result Value Ref Range   Glucose 90 70 - 99 mg/dL   BUN 19 8 - 27 mg/dL   Creatinine, Ser 6.96 0.57 - 1.00 mg/dL   eGFR 61 >29 BM/WUX/3.24   BUN/Creatinine Ratio 20 12 - 28   Sodium 139 134 - 144 mmol/L   Potassium 4.7 3.5 - 5.2 mmol/L   Chloride 103 96 - 106 mmol/L   CO2 19 (L) 20 - 29 mmol/L   Calcium 10.2 8.7 - 10.3 mg/dL   Total Protein 6.3 6.0 - 8.5 g/dL   Albumin 4.0 3.7 - 4.7 g/dL   Globulin, Total 2.3 1.5 - 4.5 g/dL   Albumin/Globulin Ratio 1.7 1.2 - 2.2   Bilirubin Total 0.6 0.0 - 1.2 mg/dL   Alkaline Phosphatase 86 44 - 121 IU/L   AST 15 0 - 40 IU/L   ALT 13 0 - 32 IU/L  Lipid Panel w/o Chol/HDL Ratio   Collection Time: 08/21/22 11:14 AM  Result Value Ref Range   Cholesterol, Total 222 (  H) 100 - 199 mg/dL   Triglycerides 841 0 - 149 mg/dL   HDL 79 >32 mg/dL   VLDL Cholesterol Cal 22 5 - 40 mg/dL   LDL Chol Calc (NIH) 440 (H) 0 - 99 mg/dL  Magnesium   Collection Time: 08/21/22 11:14 AM  Result Value Ref Range   Magnesium 1.8 1.6 - 2.3 mg/dL  VITAMIN D 25 Hydroxy (Vit-D Deficiency, Fractures)   Collection Time: 08/21/22 11:14 AM  Result Value Ref Range   Vit D, 25-Hydroxy 68.6 30.0 - 100.0 ng/mL  Vitamin B12   Collection Time: 08/21/22 11:14 AM  Result Value Ref Range   Vitamin B-12 574 232 - 1,245 pg/mL  C-reactive  protein   Collection Time: 08/21/22 11:14 AM  Result Value Ref Range   CRP <1 0 - 10 mg/L  Sed Rate (ESR)   Collection Time: 08/21/22 11:14 AM  Result Value Ref Range   Sed Rate 19 0 - 40 mm/hr  Alpha-1-antitrypsin   Collection Time: 08/21/22 11:14 AM  Result Value Ref Range   A-1 Antitrypsin 167 101 - 187 mg/dL      Assessment & Plan:   Problem List Items Addressed This Visit       Cardiovascular and Mediastinum   Aortic atherosclerosis (HCC) - Primary   Chronic.  Noted on CT scan 09/10/2017.  Recommend continue ASA and statin daily for prevention.      Relevant Medications   atorvastatin (LIPITOR) 20 MG tablet   furosemide (LASIX) 40 MG tablet   irbesartan (AVAPRO) 300 MG tablet   nebivolol (BYSTOLIC) 5 MG tablet   Essential hypertension, benign   Chronic, stable.  BP at goal on her home checks. Continue current medication regimen and adjust as needed + collaboration with cardiology -- recommend she return for follow-up with them.  Recommend she monitor BP at least a few mornings a week at home and document.  DASH diet at home.  Labs: CBC, CMP, TSH.  Refills as needed.       Relevant Medications   atorvastatin (LIPITOR) 20 MG tablet   furosemide (LASIX) 40 MG tablet   irbesartan (AVAPRO) 300 MG tablet   nebivolol (BYSTOLIC) 5 MG tablet     Respiratory   Asthma in adult, moderate persistent, uncomplicated   Chronic, stable at this time.  Continue current inhaler regimen and adjust as needed.  Refills sent.  Treat exacerbations as needed.  Pollen is a trigger for her. Start Flonase and recommend she start taking her Zyrtec daily.      Relevant Medications   albuterol (VENTOLIN HFA) 108 (90 Base) MCG/ACT inhaler   albuterol (PROVENTIL) (2.5 MG/3ML) 0.083% nebulizer solution   fluticasone-salmeterol (WIXELA INHUB) 250-50 MCG/ACT AEPB   Tiotropium Bromide Monohydrate (SPIRIVA RESPIMAT) 1.25 MCG/ACT AERS   OSA (obstructive sleep apnea)   Chronic, stable.  Continue 100%  use of CPAP at night.        Other   BMI 50.0-59.9, adult [facility])   Refer to morbid obesity plan of care.      Depression   Chronic, stable.  Denies SI/HI.  Continue current medication regimen and adjust as needed.      Relevant Medications   buPROPion (WELLBUTRIN XL) 300 MG 24 hr tablet   traZODone (DESYREL) 50 MG tablet   Hyperlipidemia   Chronic, ongoing.  Did not tolerate Rosuvastatin, continue Atorvastatin.  Recheck labs outpatient and discuss possible Zetia trial if elevations or monitor labs only due to advanced age and to minimize medications.  Relevant Medications   atorvastatin (LIPITOR) 20 MG tablet   furosemide (LASIX) 40 MG tablet   irbesartan (AVAPRO) 300 MG tablet   nebivolol (BYSTOLIC) 5 MG tablet   Insomnia   Chronic, ongoing.  Benefit from Trazodone at night as needed.  Continue current medication regimen and adjust as needed.  Continue focus on sleep hygiene.      Lymphedema   Chronic, ongoing.  Recommend use of Tylenol as needed for pain, max 3000 MG total daily dosing.  Avoid Ibuprofen or NSAID products.  Continue using compression wraps daily and massage therapy.  Recommend return to vascular for a visit when possible.  Treat cellulitis as needed.      Relevant Medications   furosemide (LASIX) 40 MG tablet   Mobility impaired   She is homebound and unable to attend in office visits.  Did place referral in past to homebound provider, however she wishes to stay with PCP at present.  She agrees to 6 month virtual visits + outpatient labs.  Discussed with her that if gets to time where she can not perform virtual visits or outpatient labs, then will recommend homebound provider.      Morbid obesity (HCC)   BMI 59.28 last visit with underlying HTN and ASTHMA.  Recommended eating smaller high protein, low fat meals more frequently and exercising 30 mins a day 5 times a week with a goal of 10-15lb weight loss in the next 3 months. Patient voiced their  understanding and motivation to adhere to these recommendations.       Vitamin B12 deficiency   Ongoing, continue daily supplement and recheck B12 level outpatient.      Vitamin D deficiency   Ongoing and stable.  Continue daily supplement and recheck level outpatient.        Follow up plan: Return in about 6 months (around 12/15/2023) for COPD, HTN/HLD, MOOD virtual follow-up + in April or May for labs outpatient.

## 2023-06-17 NOTE — Assessment & Plan Note (Signed)
Chronic, stable at this time.  Continue current inhaler regimen and adjust as needed.  Refills sent.  Treat exacerbations as needed.  Pollen is a trigger for her. Start Flonase and recommend she start taking her Zyrtec daily.

## 2023-06-17 NOTE — Assessment & Plan Note (Signed)
Chronic, stable.  Continue 100% use of CPAP at night.

## 2023-06-17 NOTE — Assessment & Plan Note (Signed)
Chronic.  Noted on CT scan 09/10/2017.  Recommend continue ASA and statin daily for prevention.

## 2023-06-17 NOTE — Assessment & Plan Note (Signed)
Chronic, stable.  BP at goal on her home checks. Continue current medication regimen and adjust as needed + collaboration with cardiology -- recommend she return for follow-up with them.  Recommend she monitor BP at least a few mornings a week at home and document.  DASH diet at home.  Labs: CBC, CMP, TSH.  Refills as needed.

## 2023-06-17 NOTE — Assessment & Plan Note (Signed)
Chronic, ongoing.  Recommend use of Tylenol as needed for pain, max 3000 MG total daily dosing.  Avoid Ibuprofen or NSAID products.  Continue using compression wraps daily and massage therapy.  Recommend return to vascular for a visit when possible.  Treat cellulitis as needed.

## 2023-06-17 NOTE — Assessment & Plan Note (Signed)
Chronic, stable.  Denies SI/HI.  Continue current medication regimen and adjust as needed.

## 2023-07-31 ENCOUNTER — Ambulatory Visit (INDEPENDENT_AMBULATORY_CARE_PROVIDER_SITE_OTHER): Payer: Medicare PPO | Admitting: Emergency Medicine

## 2023-07-31 VITALS — Ht 63.5 in | Wt 320.0 lb

## 2023-07-31 DIAGNOSIS — Z0000 Encounter for general adult medical examination without abnormal findings: Secondary | ICD-10-CM | POA: Diagnosis not present

## 2023-07-31 NOTE — Patient Instructions (Addendum)
 [name] , Thank you for taking time to come for your Medicare Wellness Visit. I appreciate your ongoing commitment to your health goals. Please review the following plan we discussed and let me know if I can assist you in the future.   Referrals/Orders/Follow-Ups/Clinician Recommendations: Get another pneumonia vaccine at your convenience. Discuss with [name] the need to get a tetanus and shingles vaccine  This is a list of the screening recommended for you and due dates:  Health Maintenance  Topic Date Due   Zoster (Shingles) Vaccine (1 of 2) 07/23/1990   Flu Shot  08/18/2023*   Pneumonia Vaccine (2 of 2 - PPSV23 or PCV20) 03/03/2024*   DTaP/Tdap/Td vaccine (2 - Tdap) 09/01/2023   Medicare Annual Wellness Visit  07/30/2024   DEXA scan (bone density measurement)  Completed   HPV Vaccine  Aged Out   COVID-19 Vaccine  Discontinued  *Topic was postponed. The date shown is not the original due date.    Advanced directives: (Copy Requested) Please bring a copy of your health care power of attorney and living will to the office to be added to your chart at your convenience. You can mail to [facility] [address] [place], [place] [address] or email to [email or web address]  Next Medicare Annual Wellness Visit scheduled for next year: Yes, 08/12/24 @ 10:40am (video visit)

## 2023-07-31 NOTE — Progress Notes (Signed)
 Subjective:   [name] is a 83 y.o. who presents for a Medicare Wellness preventive visit.  Visit Complete: Virtual I connected with  [name] on 07/31/23 by a audio enabled telemedicine application and verified that I am speaking with the correct person using two identifiers.  Patient Location: Home  Provider Location: Home Office  I discussed the limitations of evaluation and management by telemedicine. The patient expressed understanding and agreed to proceed.  Vital Signs: Because this visit was a virtual/telehealth visit, some criteria may be missing or patient reported. Any vitals not documented were not able to be obtained and vitals that have been documented are patient reported.  VideoDeclined- This patient declined Librarian, academic. Therefore the visit was completed with audio only.  Persons Participating in Visit: Patient.  AWV Questionnaire: No: Patient Medicare AWV questionnaire was not completed prior to this visit.  Cardiac Risk Factors include: advanced age (>18men, >15 women);hypertension;obesity (BMI >30kg/m2);sedentary lifestyle;dyslipidemia;Other (see comment), Risk factor comments: OSA (cpap)     Objective:    Today's Vitals   07/31/23 1040 07/31/23 1041  Weight: (!) 320 lb (145.2 kg)   Height: 5' 3.5" (1.613 m)   PainSc:  8    Body mass index is 55.8 kg/m.     07/31/2023   11:02 AM 07/15/2022    9:24 AM 05/24/2021   10:06 AM 07/27/2020    6:54 AM 04/17/2020    2:40 PM 04/12/2019    2:37 PM 01/14/2018    4:55 PM  Advanced Directives  Does Patient Have a Medical Advance Directive? Yes No No No No No No  Type of Estate agent of [place];Living will        Does patient want to make changes to medical advance directive? No - Patient declined        Copy of Healthcare Power of Attorney in Chart? No - copy requested        Would patient like information on creating a medical advance directive?  No -  Patient declined No - Patient declined No - Patient declined       Current Medications (verified) Outpatient Encounter Medications as of 07/31/2023  Medication Sig   albuterol (PROVENTIL) (2.5 MG/3ML) 0.083% nebulizer solution Take 3 mLs (2.5 mg total) by nebulization every 6 (six) hours as needed for wheezing or shortness of breath.   albuterol (VENTOLIN HFA) 108 (90 Base) MCG/ACT inhaler Inhale 2 puffs into the lungs every 6 (six) hours as needed for wheezing or shortness of breath.   Apple Cider Vinegar 500 MG TABS Take 500 mg by mouth with breakfast, with lunch, and with evening meal.   Ascorbic Acid (VITAMIN C) 1000 MG tablet Take 1,000 mg by mouth daily.   aspirin EC 81 MG tablet Take 81 mg by mouth every evening. Swallow whole.   atorvastatin (LIPITOR) 20 MG tablet Take 1 tablet (20 mg total) by mouth daily.   benzonatate (TESSALON PERLES) 100 MG capsule Take 1 capsule (100 mg total) by mouth 3 (three) times daily as needed.   buPROPion (WELLBUTRIN XL) 300 MG 24 hr tablet Take 1 tablet (300 mg total) by mouth daily.   cetirizine (ZYRTEC) 10 MG tablet Take 10 mg by mouth daily.   Cholecalciferol (VITAMIN D3) 125 MCG (5000 UT) TABS Take 5,000 Units by mouth in the morning and at bedtime. Morning & afternoon   Coenzyme Q10 (COQ10 PO) Take 1 tablet by mouth 2 (two) times daily. Includes beets  Docosahexaenoic Acid (DHA PO) Take 2 tablets by mouth daily. Fish Oil   docusate sodium (COLACE) 100 MG capsule Take 100 mg by mouth 2 (two) times daily as needed (constipation).   fluticasone-salmeterol (WIXELA INHUB) 250-50 MCG/ACT AEPB Inhale 1 puff into the lungs in the morning and at bedtime.   furosemide (LASIX) 40 MG tablet TAKE 1 TABLET(40 MG) BY MOUTH DAILY AS NEEDED   guaiFENesin (MUCINEX) 600 MG 12 hr tablet Take 600 mg by mouth daily.   irbesartan (AVAPRO) 300 MG tablet TAKE 1 TABLET(300 MG) BY MOUTH DAILY   ketoconazole (NIZORAL) 2 % cream Apply 1 Application topically daily.   magnesium  oxide (MAG-OX) 400 MG tablet Take 400 mg by mouth 2 (two) times a day. After lunch & 1700   mupirocin ointment (BACTROBAN) 2 % Apply 1 application topically 2 (two) times daily.   nebivolol (BYSTOLIC) 5 MG tablet Take 1 tablet (5 mg total) by mouth daily.   omeprazole (PRILOSEC) 20 MG capsule TAKE 1 CAPSULE(20 MG) BY MOUTH DAILY   OVER THE COUNTER MEDICATION Lymph MD 1 tablet twice daily   Tiotropium Bromide Monohydrate (SPIRIVA RESPIMAT) 1.25 MCG/ACT AERS Inhale 2 puffs into the lungs daily.   traZODone (DESYREL) 50 MG tablet TAKE 1 TABLET(50 MG) BY MOUTH AT BEDTIME AS NEEDED FOR SLEEP   triamcinolone cream (KENALOG) 0.1 % APPLY TO THE AFFECTED AREA(S) TWICE DAILY   TURMERIC CURCUMIN PO Take 1 tablet by mouth daily.   vitamin B-12 (CYANOCOBALAMIN) 500 MCG tablet Take 500 mcg by mouth daily.   vitamin E 400 UNIT capsule Take 400 Units by mouth daily.   No facility-administered encounter medications on file as of 07/31/2023.    Allergies (verified) Benicar [olmesartan], Latex, Rosuvastatin, Ace inhibitors, Codeine, Ciprofloxacin, Diovan [valsartan], Flagyl [metronidazole], Lotensin [benazepril hcl], Penicillins, and Sulfa antibiotics   History: Past Medical History:  Diagnosis Date   Allergy    Anemia    vitamin d deficiency   Arthritis    Asthma    Chronic fatigue syndrome    COPD (chronic obstructive pulmonary disease) (HCC)    COVID 05/2020   Diverticulosis    Dyspnea    Fractured fibula    GERD (gastroesophageal reflux disease)    Gout    Headache    migraines   History of blood transfusion    Hypertension    Lymphedema 2004   Neuromuscular disorder (HCC)    Pneumonia    Shingles    Sleep apnea    cpap   Past Surgical History:  Procedure Laterality Date   ABDOMINAL HYSTERECTOMY  1974   BIOPSY THYROID     CATARACT EXTRACTION W/PHACO Left 07/27/2020   Procedure: CATARACT EXTRACTION PHACO AND INTRAOCULAR LENS PLACEMENT (IOC) LEFT DIABETIC;  Surgeon: Nevada Crane,  MD;  Location: [facility] ORS;  Service: Ophthalmology;  Laterality: Left;  Lot #[id number] H [place]; 01:04.8 CDE: 8.86   CHOLECYSTECTOMY  2005   Family History  Problem Relation Age of Onset   Heart disease Mother    Hyperlipidemia Mother    Diabetes Father    Alcoholism Father    Cancer Brother    Hypertension Son    Stroke Maternal Grandmother    Social History   Socioeconomic History   Marital status: Widowed    Spouse name: Not on file   Number of children: 4   Years of education: 14 years   Highest education level: Some college, no degree  Occupational History   Not on file  Tobacco Use  Smoking status: Never    Passive exposure: Past   Smokeless tobacco: Never  Vaping Use   Vaping status: Never Used  Substance and Sexual Activity   Alcohol use: No    Alcohol/week: 0.0 standard drinks of alcohol   Drug use: No   Sexual activity: Not Currently  Other Topics Concern   Not on file  Social History Narrative   Card making club.       Social Drivers of Corporate investment banker Strain: Low Risk  (07/31/2023)   Overall Financial Resource Strain (CARDIA)    Difficulty of Paying Living Expenses: Not hard at all  Food Insecurity: No Food Insecurity (07/31/2023)   Hunger Vital Sign    Worried About Running Out of Food in the Last Year: Never true    Ran Out of Food in the Last Year: Never true  Transportation Needs: No Transportation Needs (07/31/2023)   PRAPARE - Administrator, Civil Service (Medical): No    Lack of Transportation (Non-Medical): No  Physical Activity: Insufficiently Active (07/31/2023)   Exercise Vital Sign    Days of Exercise per Week: 4 days    Minutes of Exercise per Session: 10 min  Stress: No Stress Concern Present (07/31/2023)   [employer] of Occupational Health - Occupational Stress Questionnaire    Feeling of Stress : Only a little  Social Connections: Moderately Isolated (07/31/2023)   Social Connection and Isolation Panel  [NHANES]    Frequency of Communication with Friends and Family: More than three times a week    Frequency of Social Gatherings with Friends and Family: More than three times a week    Attends Religious Services: Never    Database administrator or Organizations: Yes    Attends Banker Meetings: Never    Marital Status: Widowed    Tobacco Counseling Counseling given: Not Answered    Clinical Intake:  Pre-visit preparation completed: Yes  Pain : 0-10 Pain Score: 8  Pain Type: Chronic pain Pain Location: Wrist Pain Orientation: Right Pain Descriptors / Indicators: Aching     BMI - recorded: 55.8 Nutritional Status: BMI > 30  Obese Nutritional Risks: None Diabetes: No  How often do you need to have someone help you when you read instructions, pamphlets, or other written materials from your doctor or pharmacy?: 1 - Never  Interpreter Needed?: No  Information entered by :: Tora Kindred, CMA   Activities of Daily Living     07/31/2023   10:44 AM  In your present state of health, do you have any difficulty performing the following activities:  Hearing? 0  Vision? 0  Difficulty concentrating or making decisions? 0  Walking or climbing stairs? 1  Comment uses power chair  Dressing or bathing? 0  Doing errands, shopping? 1  Comment don't drive, relies on family  Preparing Food and eating ? N  Using the Toilet? N  In the past six months, have you accidently leaked urine? Y  Comment wears pad  Do you have problems with loss of bowel control? N  Managing your Medications? N  Managing your Finances? N  Housekeeping or managing your Housekeeping? Y  Comment family does vacuuming and deep cleaning    Patient Care Team: Marjie Skiff, NP as PCP - General (Nurse Practitioner) Erin Fulling, MD as Consulting Physician (Pulmonary Disease) Antonieta Iba, MD as Consulting Physician (Cardiology) Gustavus Bryant, LCSW as Social Worker (Licensed Clinical  Social Worker) Minor, Ma Rings  S, RN (Inactive) as Case Pension scheme manager, [facility] (Optometry)  Indicate any recent Medical Services you may have received from other than [facility] providers in the past year (date may be approximate).     Assessment:   This is a routine wellness examination for Rosario.  Hearing/Vision screen Hearing Screening - Comments:: No hearing loss Vision Screening - Comments:: Gets eye exams, [facility] [place]   Goals Addressed             This Visit's Progress    Patient Stated       Try to move more       Depression Screen     07/31/2023   10:59 AM 06/17/2023   10:07 AM 02/21/2023    2:07 PM 08/21/2022   11:10 AM 07/15/2022    9:22 AM 07/04/2022   10:38 AM 03/19/2022    9:37 AM  PHQ 2/9 Scores  PHQ - 2 Score 0 0 0 0 0 0 0  PHQ- 9 Score 1 3 2 2  0 2 3    Fall Risk     07/31/2023   11:08 AM 06/17/2023   10:09 AM 08/21/2022   11:10 AM 07/15/2022    9:24 AM 07/04/2022   10:38 AM  Fall Risk   Falls in the past year? 0 0 0 0 0  Number falls in past yr: 0 0 0 0 0  Injury with Fall? 0 0 0 0 0  Risk for fall due to : No Fall Risks Impaired balance/gait;Impaired mobility No Fall Risks No Fall Risks No Fall Risks  Follow up Falls prevention discussed;Falls evaluation completed Falls evaluation completed Falls evaluation completed Falls prevention discussed;Falls evaluation completed Falls evaluation completed    MEDICARE RISK AT HOME:  Medicare Risk at Home Any stairs in or around the home?: No (wheelchair ramp) If so, are there any without handrails?: No Home free of loose throw rugs in walkways, pet beds, electrical cords, etc?: Yes Adequate lighting in your home to reduce risk of falls?: Yes Life alert?: No Use of a cane, walker or w/c?: Yes (power chair) Grab bars in the bathroom?: Yes Shower chair or bench in shower?: Yes Elevated toilet seat or a handicapped toilet?: Yes  TIMED UP AND GO:  Was the test performed?  No  Cognitive  Function: 6CIT completed        07/31/2023   11:12 AM 07/15/2022    9:29 AM 04/17/2020    2:49 PM 01/01/2018    1:20 PM 11/08/2016    8:41 AM  6CIT Screen  What Year? 0 points 0 points 0 points 0 points 0 points  What month? 0 points 0 points 0 points 0 points 0 points  What time? 0 points 0 points 0 points 0 points 0 points  Count back from 20 0 points 0 points 0 points 0 points 0 points  Months in reverse 0 points 0 points 0 points 0 points 0 points  Repeat phrase 0 points 0 points 0 points 2 points 0 points  Total Score 0 points 0 points 0 points 2 points 0 points    Immunizations Immunization History  Administered Date(s) Administered   Influenza, High Dose Seasonal PF 02/21/2016, 02/12/2017   Influenza,inj,Quad PF,6+ Mos 02/22/2015   Influenza-Unspecified 02/16/2014   Pneumococcal Conjugate-13 08/31/2013   Td 08/31/2013   Zoster, Live 11/12/2011    Screening Tests Health Maintenance  Topic Date Due   Zoster Vaccines- Shingrix (1 of 2) 07/23/1990  INFLUENZA VACCINE  08/18/2023 (Originally 12/19/2022)   Pneumonia Vaccine 4+ Years old (2 of 2 - PPSV23 or PCV20) 03/03/2024 (Originally 10/26/2013)   DTaP/Tdap/Td (2 - Tdap) 09/01/2023   Medicare Annual Wellness (AWV)  07/30/2024   DEXA SCAN  Completed   HPV VACCINES  Aged Out   COVID-19 Vaccine  Discontinued    Health Maintenance  Health Maintenance Due  Topic Date Due   Zoster Vaccines- Shingrix (1 of 2) 07/23/1990   Health Maintenance Items Addressed: See Nurse Notes  Additional Screening:  Vision Screening: Recommended annual ophthalmology exams for early detection of glaucoma and other disorders of the eye.  Dental Screening: Recommended annual dental exams for proper oral hygiene  Community Resource Referral / Chronic Care Management: CRR required this visit?  No   CCM required this visit?  No     Plan:     I have personally reviewed and noted the following in the patient's chart:   Medical and  social history Use of alcohol, tobacco or illicit drugs  Current medications and supplements including opioid prescriptions. Patient is not currently taking opioid prescriptions. Functional ability and status Nutritional status Physical activity Advanced directives List of other physicians Hospitalizations, surgeries, and ER visits in previous 12 months Vitals Screenings to include cognitive, depression, and falls Referrals and appointments  In addition, I have reviewed and discussed with patient certain preventive protocols, quality metrics, and best practice recommendations. A written personalized care plan for preventive services as well as general preventive health recommendations were provided to patient.     Tora Kindred, CMA   07/31/2023   After Visit Summary: (MyChart) Due to this being a telephonic visit, the after visit summary with patients personalized plan was offered to patient via MyChart   Notes:  Needs pneumonia vaccine Declined flu vaccine Declined DEXA scan MMG and colon not recommended due to age Wants to discuss Tdap and shingles vaccine with provider prior to getting.

## 2023-09-14 ENCOUNTER — Other Ambulatory Visit: Payer: Self-pay | Admitting: Nurse Practitioner

## 2023-09-16 NOTE — Telephone Encounter (Signed)
 Too soon for refill, last refill 06/17/23 for 90 and 4 refills.  Requested Prescriptions  Pending Prescriptions Disp Refills   irbesartan  (AVAPRO ) 300 MG tablet [Pharmacy Med Name: IRBESARTAN  300MG  TABLETS] 90 tablet 4    Sig: TAKE 1 TABLET(300 MG) BY MOUTH DAILY     Cardiovascular:  Angiotensin Receptor Blockers Failed - 09/16/2023 12:57 PM      Failed - Cr in normal range and within 180 days    Creatinine, Ser  Date Value Ref Range Status  08/21/2022 0.94 0.57 - 1.00 mg/dL Final         Failed - K in normal range and within 180 days    Potassium  Date Value Ref Range Status  08/21/2022 4.7 3.5 - 5.2 mmol/L Final         Passed - Patient is not pregnant      Passed - Last BP in normal range    BP Readings from Last 1 Encounters:  06/17/23 131/77         Passed - Valid encounter within last 6 months    Recent Outpatient Visits   None     Future Appointments             In 3 months Cannady, [name] T, NP [place] [facility], [facility]

## 2023-10-18 ENCOUNTER — Other Ambulatory Visit: Payer: Self-pay | Admitting: Nurse Practitioner

## 2023-10-20 NOTE — Telephone Encounter (Signed)
 Rx 06/17/23 #90 4RF- 1 year supply Requested Prescriptions  Pending Prescriptions Disp Refills   omeprazole  (PRILOSEC) 20 MG capsule [Pharmacy Med Name: OMEPRAZOLE  20MG  CAPSULES] 90 capsule 4    Sig: TAKE 1 CAPSULE(20 MG) BY MOUTH DAILY     Gastroenterology: Proton Pump Inhibitors Passed - 10/20/2023 12:51 PM      Passed - Valid encounter within last 12 months    Recent Outpatient Visits   None     Future Appointments             In 2 months Cannady, [name] T, NP [place] [facility], [facility]

## 2023-10-21 ENCOUNTER — Ambulatory Visit: Payer: Self-pay | Admitting: Nurse Practitioner

## 2023-10-21 NOTE — Telephone Encounter (Signed)
  Chief Complaint: Rx Refill Additional Notes: Pt calling for omeprazole  refill. PAS transferred to triage d/t ongoing symptoms. Upon further investigation, pt states that she has heartburn over the weekend d/t not having Rx and daughter bought Rx OTC. Pt states that med provides relief but wanting a prescription so that insurance can cover. Pt denies any acute issues at this time.   Copied from CRM [id number]. Topic: Clinical - Prescription Issue >> Oct 21, 2023  1:58 PM Donald Frost wrote: Reason for CRM:  The patient called in stating she spoke with her pharmacy and they would not refill her medication,  omeprazole  (PRILOSEC) 20 MG capsule even though refills were put on her prescription from 06/17/23.  Please assist patient further as this helps with her abdominal pain and digestion and she needs it as soon as possible Reason for Disposition  [1] Prescription refill request for NON-ESSENTIAL medicine (i.e., no harm to patient if med not taken) AND [2] triager unable to refill per department policy  Answer Assessment - Initial Assessment Questions 1. DRUG NAME: "What medicine do you need to have refilled?"     Omeprazole  20 mg 2. REFILLS REMAINING: "How many refills are remaining?" (Note: The label on the medicine or pill bottle will show how many refills are remaining. If there are no refills remaining, then a renewal may be needed.)     0 3. EXPIRATION DATE: "What is the expiration date?" (Note: The label states when the prescription will expire, and thus can no longer be refilled.)     N/a 4. PRESCRIBING HCP: "Who prescribed it?" Reason: If prescribed by specialist, call should be referred to that group.     [name], NP 5. SYMPTOMS: "Do you have any symptoms?"     Heartburn - ran out of omeprazole , reports provides relief 6. PREGNANCY: "Is there any chance that you are pregnant?" "When was your last menstrual period?"     N/a  Protocols used: Medication Refill and Renewal Call-A-AH

## 2023-10-22 ENCOUNTER — Telehealth: Payer: Self-pay

## 2023-10-22 ENCOUNTER — Telehealth (INDEPENDENT_AMBULATORY_CARE_PROVIDER_SITE_OTHER): Admitting: Nurse Practitioner

## 2023-10-22 ENCOUNTER — Encounter: Payer: Self-pay | Admitting: Nurse Practitioner

## 2023-10-22 DIAGNOSIS — J45909 Unspecified asthma, uncomplicated: Secondary | ICD-10-CM

## 2023-10-22 DIAGNOSIS — I890 Lymphedema, not elsewhere classified: Secondary | ICD-10-CM | POA: Diagnosis not present

## 2023-10-22 DIAGNOSIS — Z6841 Body Mass Index (BMI) 40.0 and over, adult: Secondary | ICD-10-CM

## 2023-10-22 DIAGNOSIS — L03115 Cellulitis of right lower limb: Secondary | ICD-10-CM | POA: Insufficient documentation

## 2023-10-22 DIAGNOSIS — M25511 Pain in right shoulder: Secondary | ICD-10-CM

## 2023-10-22 DIAGNOSIS — I10 Essential (primary) hypertension: Secondary | ICD-10-CM

## 2023-10-22 DIAGNOSIS — G8929 Other chronic pain: Secondary | ICD-10-CM | POA: Diagnosis not present

## 2023-10-22 DIAGNOSIS — Z7409 Other reduced mobility: Secondary | ICD-10-CM

## 2023-10-22 DIAGNOSIS — K219 Gastro-esophageal reflux disease without esophagitis: Secondary | ICD-10-CM

## 2023-10-22 MED ORDER — DOXYCYCLINE HYCLATE 100 MG PO TABS: 100.0000 mg | ORAL_TABLET | Freq: Two times a day (BID) | ORAL | 0 refills | Status: AC

## 2023-10-22 MED ORDER — ALBUTEROL SULFATE HFA 108 (90 BASE) MCG/ACT IN AERS: 2.0000 | INHALATION_SPRAY | Freq: Four times a day (QID) | RESPIRATORY_TRACT | 4 refills | Status: DC | PRN

## 2023-10-22 MED ORDER — METHYLPREDNISOLONE 4 MG PO TBPK: ORAL_TABLET | ORAL | 0 refills | Status: DC

## 2023-10-22 NOTE — Assessment & Plan Note (Signed)
 She is homebound and unable to attend in office visits.  Did place referral in past to homebound provider, however she wishes to stay with PCP at present.  She agrees to 6 month virtual visits + outpatient labs.  Discussed with her that if gets to time where she can not perform virtual visits or outpatient labs, then will recommend homebound provider.

## 2023-10-22 NOTE — Patient Instructions (Signed)
 Joint Pain  Joint pain can be caused by many things. It may go away if you follow instructions from your health care provider for taking care of yourself at home. Sometimes, you may need more treatment. Joint pain can be caused by: Bruises at the area of the joint. An injury caused by movements that are repeated. Wear and tear on the joint as you get older. Buildup of uric acid crystals in the joint. This is also called gout. Irritation and swelling of the joint. Types of arthritis. Infections of the joint or of the bone. Your provider may tell you to take pain medicine or wear an elastic bandage, sling, or splint. If your joint pain continues, you may need lab or imaging tests to find the cause of your joint pain. Follow these instructions at home: If you have an elastic bandage, sling, or splint that can be taken off: Wear the bandage, sling, or splint as told by your provider. Take it off only if your provider says you can. Check the skin under and around it every day. Tell your provider if you see problems. Loosen it if your fingers or toes tingle, are numb, or turn cold and blue. Keep it clean and dry. Ask your provider if you should remove it before bathing. If the bandage, sling, or splint is not waterproof: Do not let it get wet. Cover it when you take a bath or shower. Use a cover that does not let any water in. Managing pain, stiffness, and swelling     If told, put ice on the area. If you have an elastic bandage, sling, or splint that you can take off, remove it as told. Put ice in a plastic bag. Place a towel between your skin and the bag. Leave the ice on for 20 minutes, 2-3 times a day. If told, put heat on the area. Do this as often as told. Use the heat source that your provider recommends, such as a moist heat pack or a heating pad. Place a towel between your skin and the heat source. Leave the heat on for 20-30 minutes. If your skin turns bright red, take off the  ice or heat right away to prevent skin damage. The risk of damage is higher if you can't feel pain, heat, or cold. Move your fingers or toes often to reduce stiffness and swelling. Raise the injured area above the level of your heart while you're sitting or lying down. Use a pillow to support the painful area as needed. Activity Rest the painful joint as told. Do not do things that cause pain or make pain worse. Begin exercising or stretching the affected area as told by your provider. Return to normal activities when you are told. Ask what things are safe for you to do. General instructions Take your medicines as told by your provider. Treatment may include medicines for pain and swelling that are taken by mouth or applied to the skin. Do not smoke, vape, or use products with nicotine or tobacco in them. If you need help quitting, talk with your provider. Keep all follow-up visits. Your provider will want to check on your condition. Contact a health care provider if: You have pain that does not get better with medicine. Your joint pain does not improve within 3 days. You have more bruising or swelling. You have a fever. You lose 10 lb (4.5 kg) or more without trying. Get help right away if: You cannot move the joint. Your fingers  or toes tingle, become numb, or turn cold and blue. You have a fever along with a joint that's red, warm, and swollen. This information is not intended to replace advice given to you by your health care provider. Make sure you discuss any questions you have with your health care provider. Document Revised: 02/06/2023 Document Reviewed: 07/19/2022 Elsevier Patient Education  2024 [employer].

## 2023-10-22 NOTE — Telephone Encounter (Signed)
 Too soon for refill, refilled 06/17/23 for 90 and 4 RF.  Requested Prescriptions  Pending Prescriptions Disp Refills   omeprazole  (PRILOSEC) 20 MG capsule 90 capsule 4    Sig: TAKE 1 CAPSULE(20 MG) BY MOUTH DAILY     Gastroenterology: Proton Pump Inhibitors Passed - 10/22/2023  2:42 PM      Passed - Valid encounter within last 12 months    Recent Outpatient Visits           Today Morbid obesity (HCC)   [place] [facility] Dane, Lavelle Posey, NP       Future Appointments             In 2 months Cannady, [name] T, NP [place] [facility], [facility]

## 2023-10-22 NOTE — Assessment & Plan Note (Signed)
 BMI 59.28 last visit with underlying HTN and ASTHMA, she is unable to weigh self at home and due to mobility is difficult to come to office.  We did work on having primary care at home in past, but she did not want to utilize that as of yet.  Recommended eating smaller high protein, low fat meals more frequently and exercising 30 mins a day 5 times a week with a goal of 10-15lb weight loss in the next 3 months. Patient voiced their understanding and motivation to adhere to these recommendations.

## 2023-10-22 NOTE — Assessment & Plan Note (Signed)
 Treated last in October 2024 for this, does well with Doxycycline  and Prednisone .  Will send in these for treatment and recommend she monitor closely.  If any worsening to alert provider.

## 2023-10-22 NOTE — Progress Notes (Addendum)
 BP (!) 129/59   Pulse 86   LMP  (LMP Unknown)   SpO2 98%    Subjective:    Patient ID: Nathen Balder, female    DOB: 1940/10/12, 83 y.o.   MRN: [id number]  HPI: ASHLEYMARIE GRANDERSON is a 83 y.o. female  Chief Complaint  Patient presents with   Arthritis   Gastroesophageal Reflux   Cellulitis    Patient states she thinks she has cellulitis on the back of her right leg   Virtual Visit via Video Note  I connected with DONELDA MAILHOT on 10/22/23 at  8:00 AM EDT by a video enabled telemedicine application and verified that I am speaking with the correct person using two identifiers.  Location: Patient: home Provider: work   I discussed the limitations of evaluation and management by telemedicine and the availability of in person appointments. The patient expressed understanding and agreed to proceed.  I discussed the assessment and treatment plan with the patient. The patient was provided an opportunity to ask questions and all were answered. The patient agreed with the plan and demonstrated an understanding of the instructions.   The patient was advised to call back or seek an in-person evaluation if the symptoms worsen or if the condition fails to improve as anticipated.  I provided 25 minutes of non-face-to-face time during this encounter.   Primus Gritton T Dyonna Jaspers, NP   GERD Continues to take Omeprazole , but reports pharmacy has said they do not have refills. GERD control status: stable Satisfied with current treatment? yes Heartburn frequency: occasional, not often Medication side effects: no  Medication compliance: stable Previous GERD medications: as above Dysphagia: no Odynophagia:  no Hematemesis: no Blood in stool: no EGD: yes   SKIN INFECTION Reports concerns for cellulitis to back of right leg and up towards right buttock.  Area is warm to touch per patient. Started yesterday.  Duration: days Location: as above History of trauma in area: no Pain: no Quality: no Redness:  yes Swelling: no Oozing: no Pus: no Fevers: no Nausea/vomiting: no Status: stable Treatments attempted:none  Tetanus: UTD   ARTHRALGIAS / JOINT ACHES Complains of joint pain to arms and left and right shoulder.  Right shoulder is the worse. Cannot raise right arm above shoulder level.  Has been present for 2 months. Duration: months Pain: yes Symmetric: yes  8/10 Quality: dull, aching, and throbbing Frequency: constant Context:  stable Decreased function/range of motion: yes Erythema: yes Swelling: no Heat or warmth: yes Morning stiffness: no Aggravating factors: movement Alleviating factors: ointments she has been using, but these stopped working  Relief with NSAIDs?: No NSAIDs Taken Treatments attempted:  as above  Involved Joints:     Hands: no    Wrists: no    Elbows: yes bilateral    Shoulders: yes bilateral    Back: yes     Hips: no    Knees: no    Ankles: no    Feet: no  Relevant past medical, surgical, family and social history reviewed and updated as indicated. Interim medical history since our last visit reviewed. Allergies and medications reviewed and updated.  Review of Systems  Constitutional:  Negative for activity change, appetite change, chills, fatigue and fever.  HENT: Negative.    Eyes:  Negative for pain and visual disturbance.  Respiratory:  Negative for cough, chest tightness, shortness of breath and wheezing.   Cardiovascular:  Positive for leg swelling. Negative for chest pain and palpitations.  Gastrointestinal: Negative.  Musculoskeletal:  Positive for arthralgias.  Skin:  Negative for wound.  Neurological:  Negative for dizziness, numbness and headaches.  Psychiatric/Behavioral: Negative.      Per HPI unless specifically indicated above     Objective:     BP (!) 129/59   Pulse 86   LMP  (LMP Unknown)   SpO2 98%   Wt Readings from Last 3 Encounters:  07/31/23 (!) 320 lb (145.2 kg)  07/15/22 (!) 340 lb (154.2 kg)  06/04/21  (!) 340 lb (154.2 kg)    Physical Exam Vitals and nursing note reviewed.  Constitutional:      General: She is awake. She is not in acute distress.    Appearance: She is well-developed and well-groomed. She is obese. She is not ill-appearing or toxic-appearing.  HENT:     Head: Normocephalic.     Right Ear: Hearing normal.     Left Ear: Hearing normal.  Eyes:     General: Lids are normal.        Right eye: No discharge.        Left eye: No discharge.     Conjunctiva/sclera: Conjunctivae normal.  Pulmonary:     Effort: Pulmonary effort is normal. No accessory muscle usage or respiratory distress.  Musculoskeletal:     Cervical back: Normal range of motion.  Neurological:     Mental Status: She is alert and oriented to person, place, and time.  Psychiatric:        Attention and Perception: Attention normal.        Mood and Affect: Mood normal.        Behavior: Behavior normal. Behavior is cooperative.        Thought Content: Thought content normal.        Judgment: Judgment normal.     Results for orders placed or performed in visit on 08/21/22  T4, free   Collection Time: 08/21/22 11:14 AM  Result Value Ref Range   Free T4 1.49 0.82 - 1.77 ng/dL  Thyroid  peroxidase antibody   Collection Time: 08/21/22 11:14 AM  Result Value Ref Range   Thyroperoxidase Ab SerPl-aCnc <9 0 - 34 IU/mL  TSH   Collection Time: 08/21/22 11:14 AM  Result Value Ref Range   TSH 3.360 0.450 - 4.500 uIU/mL  CBC with Differential/Platelet   Collection Time: 08/21/22 11:14 AM  Result Value Ref Range   WBC 4.7 3.4 - 10.8 x10E3/uL   RBC 4.71 3.77 - 5.28 x10E6/uL   Hemoglobin 14.3 11.1 - 15.9 g/dL   Hematocrit 91.4 78.2 - 46.6 %   MCV 92 79 - 97 fL   MCH 30.4 26.6 - 33.0 pg   MCHC 32.9 31.5 - 35.7 g/dL   RDW 95.6 21.3 - 08.6 %   Platelets 234 150 - 450 x10E3/uL   Neutrophils 56 Not Estab. %   Lymphs 35 Not Estab. %   Monocytes 9 Not Estab. %   Eos 0 Not Estab. %   Basos 0 Not Estab. %    Neutrophils Absolute 2.6 1.4 - 7.0 x10E3/uL   Lymphocytes Absolute 1.7 0.7 - 3.1 x10E3/uL   Monocytes Absolute 0.4 0.1 - 0.9 x10E3/uL   EOS (ABSOLUTE) 0.0 0.0 - 0.4 x10E3/uL   Basophils Absolute 0.0 0.0 - 0.2 x10E3/uL   Immature Granulocytes 0 Not Estab. %   Immature Grans (Abs) 0.0 0.0 - 0.1 x10E3/uL  Comprehensive metabolic panel   Collection Time: 08/21/22 11:14 AM  Result Value Ref Range   Glucose 90  70 - 99 mg/dL   BUN 19 8 - 27 mg/dL   Creatinine, Ser 1.61 0.57 - 1.00 mg/dL   eGFR 61 >09 UE/AVW/0.98   BUN/Creatinine Ratio 20 12 - 28   Sodium 139 134 - 144 mmol/L   Potassium 4.7 3.5 - 5.2 mmol/L   Chloride 103 96 - 106 mmol/L   CO2 19 (L) 20 - 29 mmol/L   Calcium  10.2 8.7 - 10.3 mg/dL   Total Protein 6.3 6.0 - 8.5 g/dL   Albumin 4.0 3.7 - 4.7 g/dL   Globulin, Total 2.3 1.5 - 4.5 g/dL   Albumin/Globulin Ratio 1.7 1.2 - 2.2   Bilirubin Total 0.6 0.0 - 1.2 mg/dL   Alkaline Phosphatase 86 44 - 121 IU/L   AST 15 0 - 40 IU/L   ALT 13 0 - 32 IU/L  Lipid Panel w/o Chol/HDL Ratio   Collection Time: 08/21/22 11:14 AM  Result Value Ref Range   Cholesterol, Total 222 (H) 100 - 199 mg/dL   Triglycerides 119 0 - 149 mg/dL   HDL 79 >14 mg/dL   VLDL Cholesterol Cal 22 5 - 40 mg/dL   LDL Chol Calc (NIH) 782 (H) 0 - 99 mg/dL  Magnesium   Collection Time: 08/21/22 11:14 AM  Result Value Ref Range   Magnesium 1.8 1.6 - 2.3 mg/dL  VITAMIN D  25 Hydroxy (Vit-D Deficiency, Fractures)   Collection Time: 08/21/22 11:14 AM  Result Value Ref Range   Vit D, 25-Hydroxy 68.6 30.0 - 100.0 ng/mL  Vitamin B12   Collection Time: 08/21/22 11:14 AM  Result Value Ref Range   Vitamin B-12 574 232 - 1,245 pg/mL  C-reactive protein   Collection Time: 08/21/22 11:14 AM  Result Value Ref Range   CRP <1 0 - 10 mg/L  Sed Rate (ESR)   Collection Time: 08/21/22 11:14 AM  Result Value Ref Range   Sed Rate 19 0 - 40 mm/hr  Alpha-1-antitrypsin   Collection Time: 08/21/22 11:14 AM  Result Value Ref Range    A-1 Antitrypsin 167 101 - 187 mg/dL      Assessment & Plan:   Problem List Items Addressed This Visit       Digestive   GERD without esophagitis   Chronic, stable with good control.  Continue current medication regimen and adjust as needed.  Check mag level yearly.  Risks of PPI use were discussed with patient including bone loss, C. Diff diarrhea, pneumonia, infections, CKD, electrolyte abnormalities. Verbalizes understanding and chooses to continue the medication. Difficulty attending office visits due to mobility, she is agreeable to labs in office though.  We attempted to get primary care in home, but she declined and will consider in future.         Other   Morbid obesity (HCC) - Primary   BMI 59.28 last visit with underlying HTN and ASTHMA, she is unable to weigh self at home and due to mobility is difficult to come to office.  We did work on having primary care at home in past, but she did not want to utilize that as of yet.  Recommended eating smaller high protein, low fat meals more frequently and exercising 30 mins a day 5 times a week with a goal of 10-15lb weight loss in the next 3 months. Patient voiced their understanding and motivation to adhere to these recommendations.       Mobility impaired   She is homebound and unable to attend in office visits.  Did place  referral in past to homebound provider, however she wishes to stay with PCP at present.  She agrees to 6 month virtual visits + outpatient labs.  Discussed with her that if gets to time where she can not perform virtual visits or outpatient labs, then will recommend homebound provider.      Relevant Orders   Ambulatory referral to Home Health   Lymphedema   Chronic, ongoing.  Recommend use of Tylenol as needed for pain, max 3000 MG total daily dosing.  Avoid Ibuprofen or NSAID products.  Continue using compression wraps daily and massage therapy.  Recommend return to vascular for a visit when possible.  Treat  cellulitis as needed.  Difficulty attending office visits due to mobility, she is agreeable to labs in office though.  We attempted to get primary care in home, but she declined and will consider in future.      Relevant Orders   Ambulatory referral to Home Health   Chronic right shoulder pain   Chronic, suspect some OA present.  Recommend she return to taking Tylenol 1000 MG TID as needed.  Educated her on this regimen and that it is processed via liver.  Voltaren gel as needed and continue to perform OT stretches at home.  Home health ordered to work on mobility and strength.      Relevant Medications   methylPREDNISolone  (MEDROL  DOSEPAK) 4 MG TBPK tablet   Other Relevant Orders   Ambulatory referral to Home Health   Cellulitis of right leg   Treated last in October 2024 for this, does well with Doxycycline  and Prednisone .  Will send in these for treatment and recommend she monitor closely.  If any worsening to alert provider.      BMI 50.0-59.9, adult [facility])   Refer to morbid obesity plan of care.        Follow up plan: Return if symptoms worsen or fail to improve.

## 2023-10-22 NOTE — Telephone Encounter (Unsigned)
 Copied from CRM [id number]. Topic: Clinical - Prescription Issue >> Oct 22, 2023  4:00 PM Sophia H wrote: Reason for CRM: Patient called in regarding omeprazole  (PRILOSEC) 20 MG capsule, states got a message from the pharmacy stating that it was denied and she is wondering why. Please advise, best contact # [phone]

## 2023-10-22 NOTE — Assessment & Plan Note (Signed)
 Refer to morbid obesity plan of care.

## 2023-10-22 NOTE — Assessment & Plan Note (Addendum)
 Chronic, ongoing.  Recommend use of Tylenol as needed for pain, max 3000 MG total daily dosing.  Avoid Ibuprofen or NSAID products.  Continue using compression wraps daily and massage therapy.  Recommend return to vascular for a visit when possible.  Treat cellulitis as needed.  Difficulty attending office visits due to mobility, she is agreeable to labs in office though.  We attempted to get primary care in home, but she declined and will consider in future.

## 2023-10-22 NOTE — Progress Notes (Signed)
 Lab appt scheduled.

## 2023-10-22 NOTE — Assessment & Plan Note (Signed)
 Chronic, suspect some OA present.  Recommend she return to taking Tylenol 1000 MG TID as needed.  Educated her on this regimen and that it is processed via liver.  Voltaren gel as needed and continue to perform OT stretches at home.  Home health ordered to work on mobility and strength.

## 2023-10-22 NOTE — Assessment & Plan Note (Signed)
 Chronic, stable with good control.  Continue current medication regimen and adjust as needed.  Check mag level yearly.  Risks of PPI use were discussed with patient including bone loss, C. Diff diarrhea, pneumonia, infections, CKD, electrolyte abnormalities. Verbalizes understanding and chooses to continue the medication. Difficulty attending office visits due to mobility, she is agreeable to labs in office though.  We attempted to get primary care in home, but she declined and will consider in future.

## 2023-10-23 NOTE — Telephone Encounter (Signed)
 Called and spoke with pharmacy. Medication is filled and ready to be picked up. Called and notified patient of this.

## 2023-11-14 ENCOUNTER — Other Ambulatory Visit

## 2023-11-15 ENCOUNTER — Other Ambulatory Visit: Payer: Self-pay | Admitting: Nurse Practitioner

## 2023-11-29 NOTE — Patient Instructions (Signed)
 Be Involved in Caring For Your Health:  Taking Medications When medications are taken as directed, they can greatly improve your health. But if they are not taken as prescribed, they may not work. In some cases, not taking them correctly can be harmful. To help ensure your treatment remains effective and safe, understand your medications and how to take them. Bring your medications to each visit for review by your provider.  Your lab results, notes, and after visit summary will be available on My Chart. We strongly encourage you to use this feature. If lab results are abnormal the clinic will contact you with the appropriate steps. If the clinic does not contact you assume the results are satisfactory. You can always view your results on My Chart. If you have questions regarding your health or results, please contact the clinic during office hours. You can also ask questions on My Chart.  We at [facility] are grateful that you chose us  to provide your care. We strive to provide evidence-based and compassionate care and are always looking for feedback. If you get a survey from the clinic please complete this so we can hear your opinions.  Hypertension, Adult High blood pressure (hypertension) is when the force of blood pumping through the arteries is too strong. The arteries are the blood vessels that carry blood from the heart throughout the body. Hypertension forces the heart to work harder to pump blood and may cause arteries to become narrow or stiff. Untreated or uncontrolled hypertension can lead to a heart attack, heart failure, a stroke, kidney disease, and other problems. A blood pressure reading consists of a higher number over a lower number. Ideally, your blood pressure should be below 120/80. The first (top) number is called the systolic pressure. It is a measure of the pressure in your arteries as your heart beats. The second (bottom) number is called the diastolic pressure.  It is a measure of the pressure in your arteries as the heart relaxes. What are the causes? The exact cause of this condition is not known. There are some conditions that result in high blood pressure. What increases the risk? Certain factors may make you more likely to develop high blood pressure. Some of these risk factors are under your control, including: Smoking. Not getting enough exercise or physical activity. Being overweight. Having too much fat, sugar, calories, or salt (sodium) in your diet. Drinking too much alcohol. Other risk factors include: Having a personal history of heart disease, diabetes, high cholesterol, or kidney disease. Stress. Having a family history of high blood pressure and high cholesterol. Having obstructive sleep apnea. Age. The risk increases with age. What are the signs or symptoms? High blood pressure may not cause symptoms. Very high blood pressure (hypertensive crisis) may cause: Headache. Fast or irregular heartbeats (palpitations). Shortness of breath. Nosebleed. Nausea and vomiting. Vision changes. Severe chest pain, dizziness, and seizures. How is this diagnosed? This condition is diagnosed by measuring your blood pressure while you are seated, with your arm resting on a flat surface, your legs uncrossed, and your feet flat on the floor. The cuff of the blood pressure monitor will be placed directly against the skin of your upper arm at the level of your heart. Blood pressure should be measured at least twice using the same arm. Certain conditions can cause a difference in blood pressure between your right and left arms. If you have a high blood pressure reading during one visit or you have normal blood  pressure with other risk factors, you may be asked to: Return on a different day to have your blood pressure checked again. Monitor your blood pressure at home for 1 week or longer. If you are diagnosed with hypertension, you may have other blood  or imaging tests to help your health care provider understand your overall risk for other conditions. How is this treated? This condition is treated by making healthy lifestyle changes, such as eating healthy foods, exercising more, and reducing your alcohol intake. You may be referred for counseling on a healthy diet and physical activity. Your health care provider may prescribe medicine if lifestyle changes are not enough to get your blood pressure under control and if: Your systolic blood pressure is above 130. Your diastolic blood pressure is above 80. Your personal target blood pressure may vary depending on your medical conditions, your age, and other factors. Follow these instructions at home: Eating and drinking  Eat a diet that is high in fiber and potassium, and low in sodium, added sugar, and fat. An example of this eating plan is called the DASH diet. DASH stands for Dietary Approaches to Stop Hypertension. To eat this way: Eat plenty of fresh fruits and vegetables. Try to fill one half of your plate at each meal with fruits and vegetables. Eat whole grains, such as whole-wheat pasta, brown rice, or whole-grain bread. Fill about one fourth of your plate with whole grains. Eat or drink low-fat dairy products, such as skim milk or low-fat yogurt. Avoid fatty cuts of meat, processed or cured meats, and poultry with skin. Fill about one fourth of your plate with lean proteins, such as fish, chicken without skin, beans, eggs, or tofu. Avoid pre-made and processed foods. These tend to be higher in sodium, added sugar, and fat. Reduce your daily sodium intake. Many people with hypertension should eat less than 1,500 mg of sodium a day. Do not drink alcohol if: Your health care provider tells you not to drink. You are pregnant, may be pregnant, or are planning to become pregnant. If you drink alcohol: Limit how much you have to: 0-1 drink a day for women. 0-2 drinks a day for men. Know  how much alcohol is in your drink. In the [place] one drink equals one 12 oz bottle of beer (355 mL), one 5 oz glass of wine (148 mL), or one 1 oz glass of hard liquor (44 mL). Lifestyle  Work with your health care provider to maintain a healthy body weight or to lose weight. Ask what an ideal weight is for you. Get at least 30 minutes of exercise that causes your heart to beat faster (aerobic exercise) most days of the week. Activities may include walking, swimming, or biking. Include exercise to strengthen your muscles (resistance exercise), such as Pilates or lifting weights, as part of your weekly exercise routine. Try to do these types of exercises for 30 minutes at least 3 days a week. Do not use any products that contain nicotine or tobacco. These products include cigarettes, chewing tobacco, and vaping devices, such as e-cigarettes. If you need help quitting, ask your health care provider. Monitor your blood pressure at home as told by your health care provider. Keep all follow-up visits. This is important. Medicines Take over-the-counter and prescription medicines only as told by your health care provider. Follow directions carefully. Blood pressure medicines must be taken as prescribed. Do not skip doses of blood pressure medicine. Doing this puts you at risk for problems  and can make the medicine less effective. Ask your health care provider about side effects or reactions to medicines that you should watch for. Contact a health care provider if you: Think you are having a reaction to a medicine you are taking. Have headaches that keep coming back (recurring). Feel dizzy. Have swelling in your ankles. Have trouble with your vision. Get help right away if you: Develop a severe headache or confusion. Have unusual weakness or numbness. Feel faint. Have severe pain in your chest or abdomen. Vomit repeatedly. Have trouble breathing. These symptoms may be an emergency. Get help right  away. Call 911. Do not wait to see if the symptoms will go away. Do not drive yourself to the hospital. Summary Hypertension is when the force of blood pumping through your arteries is too strong. If this condition is not controlled, it may put you at risk for serious complications. Your personal target blood pressure may vary depending on your medical conditions, your age, and other factors. For most people, a normal blood pressure is less than 120/80. Hypertension is treated with lifestyle changes, medicines, or a combination of both. Lifestyle changes include losing weight, eating a healthy, low-sodium diet, exercising more, and limiting alcohol. This information is not intended to replace advice given to you by your health care provider. Make sure you discuss any questions you have with your health care provider. Document Revised: 03/13/2021 Document Reviewed: 03/13/2021 Elsevier Patient Education  2024 [employer].

## 2023-12-01 ENCOUNTER — Encounter: Payer: Self-pay | Admitting: Nurse Practitioner

## 2023-12-01 ENCOUNTER — Telehealth (INDEPENDENT_AMBULATORY_CARE_PROVIDER_SITE_OTHER): Admitting: Nurse Practitioner

## 2023-12-01 DIAGNOSIS — I890 Lymphedema, not elsewhere classified: Secondary | ICD-10-CM

## 2023-12-01 DIAGNOSIS — Z6841 Body Mass Index (BMI) 40.0 and over, adult: Secondary | ICD-10-CM

## 2023-12-01 DIAGNOSIS — I10 Essential (primary) hypertension: Secondary | ICD-10-CM | POA: Diagnosis not present

## 2023-12-01 DIAGNOSIS — Z7409 Other reduced mobility: Secondary | ICD-10-CM

## 2023-12-01 DIAGNOSIS — R000 Tachycardia, unspecified: Secondary | ICD-10-CM | POA: Diagnosis not present

## 2023-12-01 MED ORDER — NEBIVOLOL HCL 10 MG PO TABS: 10.0000 mg | ORAL_TABLET | Freq: Every day | ORAL | 3 refills | Status: DC

## 2023-12-01 NOTE — Assessment & Plan Note (Addendum)
 Chronic, stable.  BP at goal on her home checks. Refer to tachycardia plan of care for medication changes.  Collaboration with cardiology -- recommend she return for follow-up with them.  Recommend she monitor BP at least a few mornings a week at home and document.  DASH diet at home.  Labs: unable to obtain. Will place referral to home bound care provider, Mliss Shine NP which patient would like to pursue.

## 2023-12-01 NOTE — Assessment & Plan Note (Signed)
 BMI 59.28 last visit with underlying HTN and ASTHMA, she is unable to weigh self at home and due to mobility is difficult to come to office.  We did work on having primary care at home in past, but she did not want to utilize that as of yet.  Recommended eating smaller high protein, low fat meals more frequently and exercising 30 mins a day 5 times a week with a goal of 10-15lb weight loss in the next 3 months. Patient voiced their understanding and motivation to adhere to these recommendations.

## 2023-12-01 NOTE — Assessment & Plan Note (Signed)
 Chronic, ongoing.  Recommend use of Tylenol as needed for pain, max 3000 MG total daily dosing.  Avoid Ibuprofen or NSAID products.  Continue using compression wraps daily and massage therapy.  Recommend return to vascular for a visit when possible.  Difficulty attending office visits due to mobility, she is agreeable to labs in office though.  We will attempt to get primary care in home, referral placed to Mliss Shine NP.

## 2023-12-01 NOTE — Progress Notes (Signed)
 BP (!) 128/59   Pulse 100   LMP  (LMP Unknown)   SpO2 95%    Subjective:    Patient ID: [name], female    DOB: [birth year], 83 y.o.   MRN: [id number]  HPI: [name] is a 83 y.o. female  Chief Complaint  Patient presents with   Heart Rate Problem    Patient states she has been getting heart rate readings between 81 and 110. States she gets these readings at rest.    Virtual Visit via Video Note  I connected with TORII ROYSE on 12/01/23 at  4:20 PM EDT by a video enabled telemedicine application and verified that I am speaking with the correct person using two identifiers.  Location: Patient: home Provider: work   I discussed the limitations of evaluation and management by telemedicine and the availability of in person appointments. The patient expressed understanding and agreed to proceed.  I discussed the assessment and treatment plan with the patient. The patient was provided an opportunity to ask questions and all were answered. The patient agreed with the plan and demonstrated an understanding of the instructions.   The patient was advised to call back or seek an in-person evaluation if the symptoms worsen or if the condition fails to improve as anticipated.  I provided 25 minutes of non-face-to-face time during this encounter.   Mirriam Vadala T Vana Arif, NP   HYPERTENSION without Chronic Kidney Disease and HEART RATE CONTROL Taking Avapro  and Bystolic . She reports her heart rate has been kind of crazy.  This has been ongoing since she came off steroid, about 4 weeks ago.  Pulse has been ranging from 81 to 110.  Seems to be on the high end more than the low end.  This is at rest.  Has not been into office since 08/21/22 due to lymphedema and difficulty leaving the home.  She would like the referral to home based care placed again.  We had lengthy discussion on importance of this for overall safe healthcare, as needs hands on assessment and labs. Hypertension status: stable   Satisfied with current treatment? yes Duration of hypertension: chronic BP monitoring frequency:  occasional BP range:  BP medication side effects:  no Medication compliance: good compliance Aspirin: no Recurrent headaches: no Visual changes: no Palpitations: no Dyspnea: no Chest pain: no Lower extremity edema: yes has lymphedema Dizzy/lightheaded: a couple very slight ones after movement   Relevant past medical, surgical, family and social history reviewed and updated as indicated. Interim medical history since our last visit reviewed. Allergies and medications reviewed and updated.  Review of Systems  Constitutional:  Negative for activity change, appetite change, diaphoresis, fatigue and fever.  Respiratory:  Positive for shortness of breath (at baseline). Negative for cough, chest tightness and wheezing.   Cardiovascular:  Positive for leg swelling (at baseline). Negative for chest pain and palpitations.  Gastrointestinal: Negative.   Endocrine: Negative.   Neurological: Negative.   Psychiatric/Behavioral: Negative.      Per HPI unless specifically indicated above     Objective:    BP (!) 128/59   Pulse 100   LMP  (LMP Unknown)   SpO2 95%   Wt Readings from Last 3 Encounters:  07/31/23 (!) 320 lb (145.2 kg)  07/15/22 (!) 340 lb (154.2 kg)  06/04/21 (!) 340 lb (154.2 kg)    Physical Exam Vitals and nursing note reviewed.  Constitutional:      General: She is awake. She is not  in acute distress.    Appearance: She is well-developed and well-groomed. She is obese. She is not ill-appearing or toxic-appearing.  HENT:     Head: Normocephalic.     Right Ear: Hearing normal.     Left Ear: Hearing normal.  Eyes:     General: Lids are normal.        Right eye: No discharge.        Left eye: No discharge.     Conjunctiva/sclera: Conjunctivae normal.  Pulmonary:     Effort: Pulmonary effort is normal. No accessory muscle usage or respiratory distress.   Musculoskeletal:     Cervical back: Normal range of motion.  Neurological:     Mental Status: She is alert and oriented to person, place, and time.  Psychiatric:        Attention and Perception: Attention normal.        Mood and Affect: Mood normal.        Behavior: Behavior normal. Behavior is cooperative.        Thought Content: Thought content normal.        Judgment: Judgment normal.     Results for orders placed or performed in visit on 08/21/22  T4, free   Collection Time: 08/21/22 11:14 AM  Result Value Ref Range   Free T4 1.49 0.82 - 1.77 ng/dL  Thyroid  peroxidase antibody   Collection Time: 08/21/22 11:14 AM  Result Value Ref Range   Thyroperoxidase Ab SerPl-aCnc <9 0 - 34 IU/mL  TSH   Collection Time: 08/21/22 11:14 AM  Result Value Ref Range   TSH 3.360 0.450 - 4.500 uIU/mL  CBC with Differential/Platelet   Collection Time: 08/21/22 11:14 AM  Result Value Ref Range   WBC 4.7 3.4 - 10.8 x10E3/uL   RBC 4.71 3.77 - 5.28 x10E6/uL   Hemoglobin 14.3 11.1 - 15.9 g/dL   Hematocrit 56.5 65.9 - 46.6 %   MCV 92 79 - 97 fL   MCH 30.4 26.6 - 33.0 pg   MCHC 32.9 31.5 - 35.7 g/dL   RDW 86.5 88.2 - 84.5 %   Platelets 234 150 - 450 x10E3/uL   Neutrophils 56 Not Estab. %   Lymphs 35 Not Estab. %   Monocytes 9 Not Estab. %   Eos 0 Not Estab. %   Basos 0 Not Estab. %   Neutrophils Absolute 2.6 1.4 - 7.0 x10E3/uL   Lymphocytes Absolute 1.7 0.7 - 3.1 x10E3/uL   Monocytes Absolute 0.4 0.1 - 0.9 x10E3/uL   EOS (ABSOLUTE) 0.0 0.0 - 0.4 x10E3/uL   Basophils Absolute 0.0 0.0 - 0.2 x10E3/uL   Immature Granulocytes 0 Not Estab. %   Immature Grans (Abs) 0.0 0.0 - 0.1 x10E3/uL  Comprehensive metabolic panel   Collection Time: 08/21/22 11:14 AM  Result Value Ref Range   Glucose 90 70 - 99 mg/dL   BUN 19 8 - 27 mg/dL   Creatinine, Ser 9.05 0.57 - 1.00 mg/dL   eGFR 61 >40 fO/fpw/8.26   BUN/Creatinine Ratio 20 12 - 28   Sodium 139 134 - 144 mmol/L   Potassium 4.7 3.5 - 5.2 mmol/L    Chloride 103 96 - 106 mmol/L   CO2 19 (L) 20 - 29 mmol/L   Calcium  10.2 8.7 - 10.3 mg/dL   Total Protein 6.3 6.0 - 8.5 g/dL   Albumin 4.0 3.7 - 4.7 g/dL   Globulin, Total 2.3 1.5 - 4.5 g/dL   Albumin/Globulin Ratio 1.7 1.2 - 2.2   Bilirubin  Total 0.6 0.0 - 1.2 mg/dL   Alkaline Phosphatase 86 44 - 121 IU/L   AST 15 0 - 40 IU/L   ALT 13 0 - 32 IU/L  Lipid Panel w/o Chol/HDL Ratio   Collection Time: 08/21/22 11:14 AM  Result Value Ref Range   Cholesterol, Total 222 (H) 100 - 199 mg/dL   Triglycerides 872 0 - 149 mg/dL   HDL 79 >60 mg/dL   VLDL Cholesterol Cal 22 5 - 40 mg/dL   LDL Chol Calc (NIH) 878 (H) 0 - 99 mg/dL  Magnesium   Collection Time: 08/21/22 11:14 AM  Result Value Ref Range   Magnesium 1.8 1.6 - 2.3 mg/dL  VITAMIN D  25 Hydroxy (Vit-D Deficiency, Fractures)   Collection Time: 08/21/22 11:14 AM  Result Value Ref Range   Vit D, 25-Hydroxy 68.6 30.0 - 100.0 ng/mL  Vitamin B12   Collection Time: 08/21/22 11:14 AM  Result Value Ref Range   Vitamin B-12 574 232 - 1,245 pg/mL  C-reactive protein   Collection Time: 08/21/22 11:14 AM  Result Value Ref Range   CRP <1 0 - 10 mg/L  Sed Rate (ESR)   Collection Time: 08/21/22 11:14 AM  Result Value Ref Range   Sed Rate 19 0 - 40 mm/hr  Alpha-1-antitrypsin   Collection Time: 08/21/22 11:14 AM  Result Value Ref Range   A-1 Antitrypsin 167 101 - 187 mg/dL      Assessment & Plan:   Problem List Items Addressed This Visit       Cardiovascular and Mediastinum   Essential hypertension, benign   Chronic, stable.  BP at goal on her home checks. Refer to tachycardia plan of care for medication changes.  Collaboration with cardiology -- recommend she return for follow-up with them.  Recommend she monitor BP at least a few mornings a week at home and document.  DASH diet at home.  Labs: unable to obtain. Will place referral to home bound care provider, Mliss Shine NP which patient would like to pursue.         Relevant  Medications   nebivolol  (BYSTOLIC ) 10 MG tablet   Other Relevant Orders   Ambulatory Referral to Primary Care     Other   Tachycardia   Has noticed this for past month.  Will increase Bystolic  to 10 MG daily, educated her on this change, but recommend she return to 5 MG dosing if HR consistently trends to <60.  Continue Avapro  at current dose. Will place referral to home bound care provider, Mliss Shine NP which patient would like to pursue.  Would benefit hands on assessment.      Morbid obesity (HCC) - Primary   BMI 59.28 last visit with underlying HTN and ASTHMA, she is unable to weigh self at home and due to mobility is difficult to come to office.  We did work on having primary care at home in past, but she did not want to utilize that as of yet.  Recommended eating smaller high protein, low fat meals more frequently and exercising 30 mins a day 5 times a week with a goal of 10-15lb weight loss in the next 3 months. Patient voiced their understanding and motivation to adhere to these recommendations.       Mobility impaired   She is homebound and unable to attend in office visits. Will place referral to home bound care provider, Mliss Shine NP which patient would like to pursue.  We discussed that it is difficult  to ensure patient safety as cannot perform hands on exam or obtain labs which would be beneficial.      Relevant Orders   Ambulatory Referral to Primary Care   Lymphedema   Chronic, ongoing.  Recommend use of Tylenol as needed for pain, max 3000 MG total daily dosing.  Avoid Ibuprofen or NSAID products.  Continue using compression wraps daily and massage therapy.  Recommend return to vascular for a visit when possible.  Difficulty attending office visits due to mobility, she is agreeable to labs in office though.  We will attempt to get primary care in home, referral placed to Mliss Shine NP.      Relevant Orders   Ambulatory Referral to Primary Care   BMI 50.0-59.9, [facility])    Refer to morbid obesity plan of care.        Follow up plan: Return for as scheduled in August.

## 2023-12-01 NOTE — Assessment & Plan Note (Signed)
 Has noticed this for past month.  Will increase Bystolic  to 10 MG daily, educated her on this change, but recommend she return to 5 MG dosing if HR consistently trends to <60.  Continue Avapro  at current dose. Will place referral to home bound care provider, Mliss Shine NP which patient would like to pursue.  Would benefit hands on assessment.

## 2023-12-01 NOTE — Assessment & Plan Note (Signed)
 Refer to morbid obesity plan of care.

## 2023-12-01 NOTE — Assessment & Plan Note (Signed)
 She is homebound and unable to attend in office visits. Will place referral to home bound care provider, Mliss Shine NP which patient would like to pursue.  We discussed that it is difficult to ensure patient safety as cannot perform hands on exam or obtain labs which would be beneficial.

## 2023-12-02 ENCOUNTER — Other Ambulatory Visit

## 2023-12-19 NOTE — Patient Instructions (Signed)
 COPD and Physical Activity Chronic obstructive pulmonary disease (COPD) is a long-term, or chronic, condition that affects the lungs. COPD is a general term that can be used to describe many problems that cause inflammation of the lungs and limit airflow. These conditions include chronic bronchitis and emphysema. The main symptom of COPD is shortness of breath, which makes it harder to do even simple tasks. This can also make it harder to exercise and stay active. Talk with your health care provider about treatments to help you breathe better and actions you can take to prevent breathing problems during physical activity. What are the benefits of exercising when you have COPD? Exercising regularly is an important part of a healthy lifestyle. You can still exercise and do physical activities even though you have COPD. Exercise and physical activity improve your shortness of breath by increasing blood flow (circulation). This causes your heart to pump more oxygen through your body. Moderate exercise can: Improve oxygen use. Increase your energy level. Help with shortness of breath. Strengthen your breathing muscles. Improve heart health. Help with sleep. Improve your self-esteem and feelings of self-worth. Lower depression, stress, and anxiety. Exercise can benefit everyone with COPD. The severity of your disease may affect how hard you can exercise, especially at first, but everyone can benefit. Talk with your health care provider about how much exercise is safe for you, and which activities and exercises are safe for you. What actions can I take to prevent breathing problems during physical activity? Sign up for a pulmonary rehabilitation program. This type of program may include: Education about lung diseases. Exercise classes that teach you how to exercise and be more active while improving your breathing. This usually involves: Exercise using your lower extremities, such as a stationary  bicycle. About 30 minutes of exercise, 2 to 5 times per week, for 6 to 12 weeks. Strength training, such as push-ups or leg lifts. Nutrition education. Group classes in which you can talk with others who also have COPD and learn ways to manage stress. If you use an oxygen tank, you should use it while you exercise. Work with your health care provider to adjust your oxygen for your physical activity. Your resting flow rate is different from your flow rate during physical activity. How to manage your breathing while exercising While you are exercising: Take slow breaths. Pace yourself, and do nottry to go too fast. Purse your lips while breathing out. Pursing your lips is similar to a kissing or whistling position. If doing exercise that uses a quick burst of effort, such as weight lifting: Breathe in before starting the exercise. Breathe out during the hardest part of the exercise, such as raising the weights. Where to find support You can find support for exercising with COPD from: Your health care provider. A pulmonary rehabilitation program. Your local health department or community health programs. Support groups, either online or in-person. Your health care provider may be able to recommend support groups. Where to find more information You can find more information about exercising with COPD from: American Lung Association: lung.org COPD Foundation: copdfoundation.org Contact a health care provider if: Your symptoms get worse. You have nausea. You have a fever. You want to start a new exercise program or a new activity. Get help right away if: You have chest pain. You cannot breathe. These symptoms may represent a serious problem that is an emergency. Do not wait to see if the symptoms will go away. Get medical help right away. Call  your local emergency services (911 in the [place] Do not drive yourself to the hospital. Summary COPD is a general term that can be used to describe  many different lung problems that cause lung inflammation and limit airflow. This includes chronic bronchitis and emphysema. Exercise and physical activity improve your shortness of breath by increasing blood flow (circulation). This causes your heart to provide more oxygen to your body. Contact your health care provider before starting any exercise program or new activity. Ask your health care provider what exercises and activities are safe for you. This information is not intended to replace advice given to you by your health care provider. Make sure you discuss any questions you have with your health care provider. Document Revised: 03/21/2023 Document Reviewed: 03/21/2023 Elsevier Patient Education  2024 [employer].

## 2023-12-23 ENCOUNTER — Telehealth: Payer: Self-pay | Admitting: Nurse Practitioner

## 2023-12-23 ENCOUNTER — Encounter: Payer: Self-pay | Admitting: Nurse Practitioner

## 2023-12-23 VITALS — BP 116/71 | HR 91

## 2023-12-23 DIAGNOSIS — I700 Atherosclerosis of aorta: Secondary | ICD-10-CM

## 2023-12-23 DIAGNOSIS — F5101 Primary insomnia: Secondary | ICD-10-CM | POA: Diagnosis not present

## 2023-12-23 DIAGNOSIS — Z6841 Body Mass Index (BMI) 40.0 and over, adult: Secondary | ICD-10-CM

## 2023-12-23 DIAGNOSIS — I10 Essential (primary) hypertension: Secondary | ICD-10-CM

## 2023-12-23 DIAGNOSIS — F3340 Major depressive disorder, recurrent, in remission, unspecified: Secondary | ICD-10-CM | POA: Diagnosis not present

## 2023-12-23 DIAGNOSIS — Z7409 Other reduced mobility: Secondary | ICD-10-CM

## 2023-12-23 DIAGNOSIS — I872 Venous insufficiency (chronic) (peripheral): Secondary | ICD-10-CM | POA: Diagnosis not present

## 2023-12-23 DIAGNOSIS — E782 Mixed hyperlipidemia: Secondary | ICD-10-CM | POA: Diagnosis not present

## 2023-12-23 NOTE — Assessment & Plan Note (Signed)
 She is homebound and unable to attend in office visits. Placed referral to home bound care provider last visit, Mliss Shine NP, which patient would like to pursue but has not heard from her.  Will check on referral as patient unable to leave home and would benefit hands on assessment and labs. We discussed that it is difficult to ensure patient safety as cannot perform hands on exam or obtain labs which would be beneficial.

## 2023-12-23 NOTE — Assessment & Plan Note (Signed)
 Chronic, ongoing.  Recommend use of Tylenol as needed for pain, max 3000 MG total daily dosing.  Continue using compression wraps daily and massage therapy.  Will have her return to vascular as needed. Placed referral to home bound care provider last visit, Mliss Shine NP, which patient would like to pursue but has not heard from her.  Will check on referral as patient unable to leave home and would benefit hands on assessment and labs.

## 2023-12-23 NOTE — Assessment & Plan Note (Signed)
 Refer to morbid obesity plan of care.

## 2023-12-23 NOTE — Assessment & Plan Note (Signed)
 BMI 59.28 last visit with underlying HTN and ASTHMA, she is unable to weigh self at home and due to mobility is difficult to come to office.  We are working on getting primary care into her home. Recommended eating smaller high protein, low fat meals more frequently and exercising 30 mins a day 5 times a week with a goal of 10-15lb weight loss in the next 3 months. Patient voiced their understanding and motivation to adhere to these recommendations.

## 2023-12-23 NOTE — Assessment & Plan Note (Signed)
 Chronic, ongoing.  Benefit from Trazodone at night as needed.  Continue current medication regimen and adjust as needed.  Continue focus on sleep hygiene.

## 2023-12-23 NOTE — Assessment & Plan Note (Signed)
 Chronic, stable.  BP at goal on her home checks. Collaboration with cardiology in past -- recommend she return for follow-up with them.  Recommend she monitor BP at least a few mornings a week at home and document.  DASH diet at home.  Labs: unable to obtain. Placed referral to home bound care provider last visit, Barbara Shine NP, which patient would like to pursue but has not heard from her.  Will check on referral as patient unable to leave home and would benefit hands on assessment and labs.

## 2023-12-23 NOTE — Assessment & Plan Note (Signed)
 Chronic, stable.  Denies SI/HI.  Continue current medication regimen and adjust as needed.

## 2023-12-23 NOTE — Assessment & Plan Note (Signed)
 Chronic, ongoing.  Did not tolerate Rosuvastatin , continue Atorvastatin .  Recheck labs outpatient and discuss possible Zetia trial if elevations or monitor labs only due to advanced age and to minimize medications. Placed referral to home bound care provider last visit, Mliss Shine NP, which patient would like to pursue but has not heard from her.  Will check on referral as patient unable to leave home and would benefit hands on assessment and labs.

## 2023-12-23 NOTE — Assessment & Plan Note (Signed)
 Chronic.  Noted on CT scan 09/10/2017.  Recommend continue ASA and statin daily for prevention.

## 2023-12-23 NOTE — Progress Notes (Signed)
 BP 116/71   Pulse 91   LMP  (LMP Unknown)   SpO2 96%    Subjective:    Patient ID: [name], female    DOB: [birth year], 83 y.o.   MRN: [id number]  HPI: [name] is a 83 y.o. female  Chief Complaint  Patient presents with   COPD   Depression   Hyperlipidemia   Hypertension   Virtual Visit via Video Note  I connected with [name] on 12/23/23 at 10:40 AM EDT by a video enabled telemedicine application and verified that I am speaking with the correct person using two identifiers. Due to mobility issues is unable to leave home for visits.  She does agree with having annual labs outpatient.  Prefers to stay with current PCP at this time but is aware if gets to where she can not perform virtual visits or obtain annual labs will need to switch over to a home bound provider.  Location: Patient: home Provider: work   I discussed the limitations of evaluation and management by telemedicine and the availability of in person appointments. The patient expressed understanding and agreed to proceed.  I discussed the assessment and treatment plan with the patient. The patient was provided an opportunity to ask questions and all were answered. The patient agreed with the plan and demonstrated an understanding of the instructions.   The patient was advised to call back or seek an in-person evaluation if the symptoms worsen or if the condition fails to improve as anticipated.  I provided 21 minutes of non-face-to-face time during this encounter.   Allianna Beaubien T Ayeden Gladman, NP   HYPERTENSION / HYPERLIPIDEMIA Taking ASA, Avapro , Bystolic , and Atorvastatin  + takes Lasix  as needed. Did follow with cardiology in past, last visit was 11/29/20.  Reports her tachycardia has been improved since increasing Bystolic .  Has chronic lymphedema which does effect mobility. Takes Tylenol twice a day.  Uses compression pumps at home. Vascular seen last 06/07/19, has not returned.  Continues on B12 and D for  history of low levels. Satisfied with current treatment? yes Duration of hypertension: chronic BP monitoring frequency: regularly BP range: around 130/70 range BP medication side effects: no Duration of hyperlipidemia: chronic Cholesterol medication side effects: no Cholesterol supplements: none Medication compliance: good compliance Aspirin: no Recent stressors: no Recurrent headaches: no Visual changes: no Palpitations: no Dyspnea: occasional when moving around Chest pain: no Lower extremity edema: baseline with her lymphedema Dizzy/lightheaded: no  ASTHMA Using Wixela & Spiriva  + Albuterol  as needed. Zyrtec taken for allergies. Reports an aggravating cough and a lot of mucus, takes Tussin and this clears up.  Uses CPAP 100% at home. Saw Dr. Isaiah with pulmonary in past, last 10/15/2018, has not returned since. COPD status: stable Satisfied with current treatment?: yes Oxygen use: no Dyspnea frequency: occasional at baseline Cough frequency: no Rescue inhaler frequency: daily Limitation of activity: no Productive cough: none Last Spirometry: with pulmonary Pneumovax: Up to Date Influenza: Up to Date   DEPRESSION Continues Wellbutrin  daily and Trazodone  at bedtime as needed (has not taken in months). Lives with her daughter and family.  At recent visit a referral to home primary care was placed as is not able to leave home for visits due to mobility. Mood status: stable Satisfied with current treatment?: yes Symptom severity: mild  Duration of current treatment : chronic Side effects: no Medication compliance: good compliance Psychotherapy/counseling: none Depressed mood: no Anxious mood: no Anhedonia: no Significant weight loss or gain:  no Insomnia: no Fatigue: no Feelings of worthlessness or guilt: no Impaired concentration/indecisiveness: no Suicidal ideations: no Hopelessness: no Crying spells: no    12/23/2023   10:36 AM 07/31/2023   10:59 AM 06/17/2023    10:07 AM 02/21/2023    2:07 PM 08/21/2022   11:10 AM  Depression screen PHQ 2/9  Decreased Interest 0 0 0 0 0  Down, Depressed, Hopeless 0 0 0 0 0  PHQ - 2 Score 0 0 0 0 0  Altered sleeping 0 0 1 1 0  Tired, decreased energy 1 1 2 1 2   Change in appetite 0 0 0 0 0  Feeling bad or failure about yourself  0 0 0 0 0  Trouble concentrating 0 0 0 0 0  Moving slowly or fidgety/restless 0 0 0 0 0  Suicidal thoughts 0 0 0 0 0  PHQ-9 Score 1 1 3 2 2   Difficult doing work/chores Not difficult at all Not difficult at all Not difficult at all Not difficult at all Not difficult at all       12/23/2023   10:37 AM 06/17/2023   10:08 AM 02/21/2023    2:08 PM 08/21/2022   11:10 AM  GAD 7 : Generalized Anxiety Score  Nervous, Anxious, on Edge 0 1 0 0  Control/stop worrying 0 3 0 0  Worry too much - different things 0 3 0 0  Trouble relaxing 0 0 0 0  Restless 0 0 0 0  Easily annoyed or irritable 0 0 0 0  Afraid - awful might happen 0 0 0 0  Total GAD 7 Score 0 7 0 0  Anxiety Difficulty Not difficult at all Not difficult at all Not difficult at all Not difficult at all   Relevant past medical, surgical, family and social history reviewed and updated as indicated. Interim medical history since our last visit reviewed. Allergies and medications reviewed and updated.  Review of Systems  Constitutional:  Negative for activity change, appetite change, chills, fatigue and fever.  HENT: Negative.    Eyes:  Negative for pain and visual disturbance.  Respiratory:  Negative for cough, chest tightness, shortness of breath and wheezing.   Cardiovascular:  Negative for chest pain, palpitations and leg swelling.  Gastrointestinal: Negative.   Neurological:  Negative for dizziness, numbness and headaches.  Psychiatric/Behavioral: Negative.     Per HPI unless specifically indicated above     Objective:    BP 116/71   Pulse 91   LMP  (LMP Unknown)   SpO2 96%   Wt Readings from Last 3 Encounters:  07/31/23  (!) 320 lb (145.2 kg)  07/15/22 (!) 340 lb (154.2 kg)  06/04/21 (!) 340 lb (154.2 kg)    Physical Exam Vitals and nursing note reviewed.  Constitutional:      General: She is awake. She is not in acute distress.    Appearance: She is well-developed and well-groomed. She is obese. She is not ill-appearing or toxic-appearing.  HENT:     Head: Normocephalic.     Right Ear: Hearing normal.     Left Ear: Hearing normal.  Eyes:     General: Lids are normal.        Right eye: No discharge.        Left eye: No discharge.     Conjunctiva/sclera: Conjunctivae normal.  Pulmonary:     Effort: Pulmonary effort is normal. No accessory muscle usage or respiratory distress.  Musculoskeletal:     Cervical  back: Normal range of motion.  Neurological:     Mental Status: She is alert and oriented to person, place, and time.  Psychiatric:        Attention and Perception: Attention normal.        Mood and Affect: Mood normal.        Behavior: Behavior normal. Behavior is cooperative.        Thought Content: Thought content normal.        Judgment: Judgment normal.    Results for orders placed or performed in visit on 08/21/22  T4, free   Collection Time: 08/21/22 11:14 AM  Result Value Ref Range   Free T4 1.49 0.82 - 1.77 ng/dL  Thyroid  peroxidase antibody   Collection Time: 08/21/22 11:14 AM  Result Value Ref Range   Thyroperoxidase Ab SerPl-aCnc <9 0 - 34 IU/mL  TSH   Collection Time: 08/21/22 11:14 AM  Result Value Ref Range   TSH 3.360 0.450 - 4.500 uIU/mL  CBC with Differential/Platelet   Collection Time: 08/21/22 11:14 AM  Result Value Ref Range   WBC 4.7 3.4 - 10.8 x10E3/uL   RBC 4.71 3.77 - 5.28 x10E6/uL   Hemoglobin 14.3 11.1 - 15.9 g/dL   Hematocrit 56.5 65.9 - 46.6 %   MCV 92 79 - 97 fL   MCH 30.4 26.6 - 33.0 pg   MCHC 32.9 31.5 - 35.7 g/dL   RDW 86.5 88.2 - 84.5 %   Platelets 234 150 - 450 x10E3/uL   Neutrophils 56 Not Estab. %   Lymphs 35 Not Estab. %   Monocytes 9  Not Estab. %   Eos 0 Not Estab. %   Basos 0 Not Estab. %   Neutrophils Absolute 2.6 1.4 - 7.0 x10E3/uL   Lymphocytes Absolute 1.7 0.7 - 3.1 x10E3/uL   Monocytes Absolute 0.4 0.1 - 0.9 x10E3/uL   EOS (ABSOLUTE) 0.0 0.0 - 0.4 x10E3/uL   Basophils Absolute 0.0 0.0 - 0.2 x10E3/uL   Immature Granulocytes 0 Not Estab. %   Immature Grans (Abs) 0.0 0.0 - 0.1 x10E3/uL  Comprehensive metabolic panel   Collection Time: 08/21/22 11:14 AM  Result Value Ref Range   Glucose 90 70 - 99 mg/dL   BUN 19 8 - 27 mg/dL   Creatinine, Ser 9.05 0.57 - 1.00 mg/dL   eGFR 61 >40 fO/fpw/8.26   BUN/Creatinine Ratio 20 12 - 28   Sodium 139 134 - 144 mmol/L   Potassium 4.7 3.5 - 5.2 mmol/L   Chloride 103 96 - 106 mmol/L   CO2 19 (L) 20 - 29 mmol/L   Calcium  10.2 8.7 - 10.3 mg/dL   Total Protein 6.3 6.0 - 8.5 g/dL   Albumin 4.0 3.7 - 4.7 g/dL   Globulin, Total 2.3 1.5 - 4.5 g/dL   Albumin/Globulin Ratio 1.7 1.2 - 2.2   Bilirubin Total 0.6 0.0 - 1.2 mg/dL   Alkaline Phosphatase 86 44 - 121 IU/L   AST 15 0 - 40 IU/L   ALT 13 0 - 32 IU/L  Lipid Panel w/o Chol/HDL Ratio   Collection Time: 08/21/22 11:14 AM  Result Value Ref Range   Cholesterol, Total 222 (H) 100 - 199 mg/dL   Triglycerides 872 0 - 149 mg/dL   HDL 79 >60 mg/dL   VLDL Cholesterol Cal 22 5 - 40 mg/dL   LDL Chol Calc (NIH) 878 (H) 0 - 99 mg/dL  Magnesium   Collection Time: 08/21/22 11:14 AM  Result Value Ref Range  Magnesium 1.8 1.6 - 2.3 mg/dL  VITAMIN D  25 Hydroxy (Vit-D Deficiency, Fractures)   Collection Time: 08/21/22 11:14 AM  Result Value Ref Range   Vit D, 25-Hydroxy 68.6 30.0 - 100.0 ng/mL  Vitamin B12   Collection Time: 08/21/22 11:14 AM  Result Value Ref Range   Vitamin B-12 574 232 - 1,245 pg/mL  C-reactive protein   Collection Time: 08/21/22 11:14 AM  Result Value Ref Range   CRP <1 0 - 10 mg/L  Sed Rate (ESR)   Collection Time: 08/21/22 11:14 AM  Result Value Ref Range   Sed Rate 19 0 - 40 mm/hr  Alpha-1-antitrypsin    Collection Time: 08/21/22 11:14 AM  Result Value Ref Range   A-1 Antitrypsin 167 101 - 187 mg/dL      Assessment & Plan:   Problem List Items Addressed This Visit       Cardiovascular and Mediastinum   Essential hypertension, benign   Chronic, stable.  BP at goal on her home checks. Collaboration with cardiology in past -- recommend she return for follow-up with them.  Recommend she monitor BP at least a few mornings a week at home and document.  DASH diet at home.  Labs: unable to obtain. Placed referral to home bound care provider last visit, Mliss Shine NP, which patient would like to pursue but has not heard from her.  Will check on referral as patient unable to leave home and would benefit hands on assessment and labs.       Chronic venous insufficiency   Chronic, ongoing.  Recommend use of Tylenol as needed for pain, max 3000 MG total daily dosing.  Continue using compression wraps daily and massage therapy.  Will have her return to vascular as needed. Placed referral to home bound care provider last visit, Mliss Shine NP, which patient would like to pursue but has not heard from her.  Will check on referral as patient unable to leave home and would benefit hands on assessment and labs.      Aortic atherosclerosis (HCC)   Chronic.  Noted on CT scan 09/10/2017.  Recommend continue ASA and statin daily for prevention.        Other   Morbid obesity (HCC)   BMI 59.28 last visit with underlying HTN and ASTHMA, she is unable to weigh self at home and due to mobility is difficult to come to office.  We are working on getting primary care into her home. Recommended eating smaller high protein, low fat meals more frequently and exercising 30 mins a day 5 times a week with a goal of 10-15lb weight loss in the next 3 months. Patient voiced their understanding and motivation to adhere to these recommendations.       Mobility impaired   She is homebound and unable to attend in office visits.  Placed referral to home bound care provider last visit, Mliss Shine NP, which patient would like to pursue but has not heard from her.  Will check on referral as patient unable to leave home and would benefit hands on assessment and labs. We discussed that it is difficult to ensure patient safety as cannot perform hands on exam or obtain labs which would be beneficial.      Insomnia   Chronic, ongoing.  Benefit from Trazodone  at night as needed.  Continue current medication regimen and adjust as needed.  Continue focus on sleep hygiene.      Hyperlipidemia   Chronic, ongoing.  Did not  tolerate Rosuvastatin , continue Atorvastatin .  Recheck labs outpatient and discuss possible Zetia trial if elevations or monitor labs only due to advanced age and to minimize medications. Placed referral to home bound care provider last visit, Mliss Shine NP, which patient would like to pursue but has not heard from her.  Will check on referral as patient unable to leave home and would benefit hands on assessment and labs.      Depression - Primary   Chronic, stable.  Denies SI/HI.  Continue current medication regimen and adjust as needed.      BMI 50.0-59.9, adult [facility])   Refer to morbid obesity plan of care.         Follow up plan: Return in about 6 months (around 06/24/2024) for HTN/HLD, MOOD, ASTHMA.

## 2023-12-24 NOTE — Progress Notes (Signed)
 Scheduled

## 2024-02-16 NOTE — Patient Instructions (Signed)
 Cellulitis, Adult    Cellulitis is a skin infection. The infected area is often warm, red, swollen, and sore. It occurs most often on the legs, feet, and toes, but can happen on any part of the body.  This condition can be life-threatening without treatment. It is very important to get treated right away.  What are the causes?  This condition is caused by bacteria. The bacteria enter through a break in the skin, such as:  A cut.  A burn.  A bug bite.  An animal bite.  An open sore.  A crack.  What increases the risk?  Having a weak body's defense system (immune system).  Being older than 83 years old.  Having a blood sugar problem (diabetes).  Having a long-term liver disease (cirrhosis) or kidney disease.  Being very overweight (obese).  Having a skin problem, such as:  An itchy rash.  A rash caused by a fungus.  A rash with blisters.  Slow movement of blood in the veins (venous stasis).  Fluid buildup below the skin (edema).  This condition is more likely to occur in people who:  Have open cuts, burns, bites, or scrapes on the skin.  Have been treated with high-energy rays (radiation).  Use IV drugs.  What are the signs or symptoms?  Skin that:  Looks red or purple, or slightly darker than your usual skin color.  Has streaks.  Has spots.  Is swollen.  Is sore or painful when you touch it.  Is warm.  A fever.  Chills.  Blisters.  Tiredness (fatigue).  How is this treated?  Medicines to treat infections or allergies.  Rest.  Placing cold or warm cloths on the skin.  Staying in the hospital, if the condition is very bad. You may need medicines through an IV.  Follow these instructions at home:  Medicines  Take over-the-counter and prescription medicines only as told by your doctor.  If you were prescribed antibiotics, take them as told by your doctor. Do not stop using them even if you start to feel better.  General instructions  Drink enough fluid to keep your pee (urine) pale yellow.  Do not touch or rub the  infected area.  Raise (elevate) the infected area above the level of your heart while you are sitting or lying down.  Return to your normal activities when your doctor says that it is safe.  Place cold or warm cloths on the area as told by your doctor.  Keep all follow-up visits. Your doctor will need to make sure that a more serious infection is not developing.  Contact a doctor if:  You have a fever.  You do not start to get better after 1-2 days of treatment.  Your bone or joint under the infected area starts to hurt after the skin has healed.  Your infection comes back in the same area or another area. Signs of this may include:  You have a swollen bump in the area.  Your red area gets larger, turns dark in color, or hurts more.  You have more fluid coming from the wound.  Pus or a bad smell develops in your infected area.  You have more pain.  You feel sick and have muscle aches and weakness.  You develop vomiting or watery poop that will not go away.  Get help right away if:  You see red streaks coming from the area.  You notice the skin turns purple or black and falls  off.  These symptoms may be an emergency. Get help right away. Call 911.  Do not wait to see if the symptoms will go away.  Do not drive yourself to the hospital.  This information is not intended to replace advice given to you by your health care provider. Make sure you discuss any questions you have with your health care provider.  Document Revised: 01/01/2022 Document Reviewed: 01/01/2022  Elsevier Patient Education  2024 [employer].

## 2024-02-18 ENCOUNTER — Telehealth: Admitting: Nurse Practitioner

## 2024-02-18 ENCOUNTER — Encounter: Payer: Self-pay | Admitting: Nurse Practitioner

## 2024-02-18 DIAGNOSIS — I890 Lymphedema, not elsewhere classified: Secondary | ICD-10-CM

## 2024-02-18 DIAGNOSIS — L03317 Cellulitis of buttock: Secondary | ICD-10-CM

## 2024-02-18 DIAGNOSIS — Z7409 Other reduced mobility: Secondary | ICD-10-CM

## 2024-02-18 DIAGNOSIS — J4540 Moderate persistent asthma, uncomplicated: Secondary | ICD-10-CM

## 2024-02-18 DIAGNOSIS — F331 Major depressive disorder, recurrent, moderate: Secondary | ICD-10-CM

## 2024-02-18 MED ORDER — DOXYCYCLINE HYCLATE 100 MG PO TABS: 100.0000 mg | ORAL_TABLET | Freq: Two times a day (BID) | ORAL | 0 refills | Status: AC

## 2024-02-18 MED ORDER — METHYLPREDNISOLONE 4 MG PO TBPK: ORAL_TABLET | ORAL | 0 refills | Status: DC

## 2024-02-18 NOTE — Assessment & Plan Note (Signed)
 Chronic, ongoing.  Recommend use of Tylenol as needed for pain, max 3000 MG total daily dosing.  Avoid Ibuprofen or NSAID products.  Continue using compression wraps daily and massage therapy.  Recommend return to vascular for a visit when possible.  Is unable to attend office visit due to chronic health issues and needs home based primary care, last referral did not go through as the provider was having family issues and is putting her practice on hold.  Will continue to look for resources to assist with this.

## 2024-02-18 NOTE — Assessment & Plan Note (Signed)
 Acute to posterior buttock -- skin intact per patient.  At this time will treat with Doxycycline  100 MG BID x 7 days and steroid taper which has worked well in past.  Monitor closely for skin breakdown and if presents alert provider immediately.  Denies s/s infection. To monitor temperature and skin closely.

## 2024-02-18 NOTE — Progress Notes (Signed)
 LMP  (LMP Unknown)    Subjective:    Patient ID: [name], female    DOB: 03-09-1941, 83 y.o.   MRN: [id number]  HPI: [name] is a 83 y.o. female  Chief Complaint  Patient presents with   Cellulitis    Has been a problem for 4-5 days, prior treatment has been creams that were given, itchy, burning   Virtual Visit via Video Note  I connected with [name] on 02/18/24 at  4:20 PM EDT by a video enabled telemedicine application and verified that I am speaking with the correct person using two identifiers.  Location: Patient: home Provider: work   I discussed the limitations of evaluation and management by telemedicine and the availability of in person appointments. The patient expressed understanding and agreed to proceed.  I discussed the assessment and treatment plan with the patient. The patient was provided an opportunity to ask questions and all were answered. The patient agreed with the plan and demonstrated an understanding of the instructions.   The patient was advised to call back or seek an in-person evaluation if the symptoms worsen or if the condition fails to improve as anticipated.  I provided 25 minutes of non-face-to-face time during this encounter.   Julio Storr T Anevay Campanella, NP   SKIN INFECTION Has underlying lymphedema. Last treated for cellulitis on 10/22/23 with Doxycycline  and steroid taper. For 4-5 days. Duration: days Location: on the right buttock area History of trauma in area: no Pain: aggravating on occasion Quality: no Severity: mild Redness: yes Swelling: yes Oozing: no Pus: no Fevers: no Nausea/vomiting: no Status: worse Treatments attempted:warm compresses  Tetanus: UTD   Relevant past medical, surgical, family and social history reviewed and updated as indicated. Interim medical history since our last visit reviewed. Allergies and medications reviewed and updated.  Review of Systems  Constitutional:  Negative for activity change,  appetite change, chills, fatigue and fever.  HENT: Negative.    Eyes:  Negative for pain and visual disturbance.  Respiratory:  Negative for cough, chest tightness, shortness of breath and wheezing.   Cardiovascular:  Positive for leg swelling. Negative for chest pain and palpitations.  Gastrointestinal: Negative.   Skin:  Negative for wound.  Neurological:  Negative for dizziness, numbness and headaches.  Psychiatric/Behavioral: Negative.      Per HPI unless specifically indicated above     Objective:    LMP  (LMP Unknown)   Wt Readings from Last 3 Encounters:  07/31/23 (!) 320 lb (145.2 kg)  07/15/22 (!) 340 lb (154.2 kg)  06/04/21 (!) 340 lb (154.2 kg)    Physical Exam Vitals and nursing note reviewed.  Constitutional:      General: She is awake. She is not in acute distress.    Appearance: She is well-developed. She is not ill-appearing.  HENT:     Head: Normocephalic.     Right Ear: Hearing normal.     Left Ear: Hearing normal.  Eyes:     General: Lids are normal.        Right eye: No discharge.        Left eye: No discharge.     Conjunctiva/sclera: Conjunctivae normal.  Pulmonary:     Effort: Pulmonary effort is normal. No accessory muscle usage or respiratory distress.  Musculoskeletal:     Cervical back: Normal range of motion.  Neurological:     Mental Status: She is alert and oriented to person, place, and time.  Psychiatric:  Attention and Perception: Attention normal.        Mood and Affect: Mood normal.        Behavior: Behavior normal. Behavior is cooperative.        Thought Content: Thought content normal.        Judgment: Judgment normal.     Results for orders placed or performed in visit on 08/21/22  T4, free   Collection Time: 08/21/22 11:14 AM  Result Value Ref Range   Free T4 1.49 0.82 - 1.77 ng/dL  Thyroid  peroxidase antibody   Collection Time: 08/21/22 11:14 AM  Result Value Ref Range   Thyroperoxidase Ab SerPl-aCnc <9 0 - 34  IU/mL  TSH   Collection Time: 08/21/22 11:14 AM  Result Value Ref Range   TSH 3.360 0.450 - 4.500 uIU/mL  CBC with Differential/Platelet   Collection Time: 08/21/22 11:14 AM  Result Value Ref Range   WBC 4.7 3.4 - 10.8 x10E3/uL   RBC 4.71 3.77 - 5.28 x10E6/uL   Hemoglobin 14.3 11.1 - 15.9 g/dL   Hematocrit 56.5 65.9 - 46.6 %   MCV 92 79 - 97 fL   MCH 30.4 26.6 - 33.0 pg   MCHC 32.9 31.5 - 35.7 g/dL   RDW 86.5 88.2 - 84.5 %   Platelets 234 150 - 450 x10E3/uL   Neutrophils 56 Not Estab. %   Lymphs 35 Not Estab. %   Monocytes 9 Not Estab. %   Eos 0 Not Estab. %   Basos 0 Not Estab. %   Neutrophils Absolute 2.6 1.4 - 7.0 x10E3/uL   Lymphocytes Absolute 1.7 0.7 - 3.1 x10E3/uL   Monocytes Absolute 0.4 0.1 - 0.9 x10E3/uL   EOS (ABSOLUTE) 0.0 0.0 - 0.4 x10E3/uL   Basophils Absolute 0.0 0.0 - 0.2 x10E3/uL   Immature Granulocytes 0 Not Estab. %   Immature Grans (Abs) 0.0 0.0 - 0.1 x10E3/uL  Comprehensive metabolic panel   Collection Time: 08/21/22 11:14 AM  Result Value Ref Range   Glucose 90 70 - 99 mg/dL   BUN 19 8 - 27 mg/dL   Creatinine, Ser 9.05 0.57 - 1.00 mg/dL   eGFR 61 >40 fO/fpw/8.26   BUN/Creatinine Ratio 20 12 - 28   Sodium 139 134 - 144 mmol/L   Potassium 4.7 3.5 - 5.2 mmol/L   Chloride 103 96 - 106 mmol/L   CO2 19 (L) 20 - 29 mmol/L   Calcium  10.2 8.7 - 10.3 mg/dL   Total Protein 6.3 6.0 - 8.5 g/dL   Albumin 4.0 3.7 - 4.7 g/dL   Globulin, Total 2.3 1.5 - 4.5 g/dL   Albumin/Globulin Ratio 1.7 1.2 - 2.2   Bilirubin Total 0.6 0.0 - 1.2 mg/dL   Alkaline Phosphatase 86 44 - 121 IU/L   AST 15 0 - 40 IU/L   ALT 13 0 - 32 IU/L  Lipid Panel w/o Chol/HDL Ratio   Collection Time: 08/21/22 11:14 AM  Result Value Ref Range   Cholesterol, Total 222 (H) 100 - 199 mg/dL   Triglycerides 872 0 - 149 mg/dL   HDL 79 >60 mg/dL   VLDL Cholesterol Cal 22 5 - 40 mg/dL   LDL Chol Calc (NIH) 878 (H) 0 - 99 mg/dL  Magnesium   Collection Time: 08/21/22 11:14 AM  Result Value Ref  Range   Magnesium 1.8 1.6 - 2.3 mg/dL  VITAMIN D  25 Hydroxy (Vit-D Deficiency, Fractures)   Collection Time: 08/21/22 11:14 AM  Result Value Ref Range   Vit D,  25-Hydroxy 68.6 30.0 - 100.0 ng/mL  Vitamin B12   Collection Time: 08/21/22 11:14 AM  Result Value Ref Range   Vitamin B-12 574 232 - 1,245 pg/mL  C-reactive protein   Collection Time: 08/21/22 11:14 AM  Result Value Ref Range   CRP <1 0 - 10 mg/L  Sed Rate (ESR)   Collection Time: 08/21/22 11:14 AM  Result Value Ref Range   Sed Rate 19 0 - 40 mm/hr  Alpha-1-antitrypsin   Collection Time: 08/21/22 11:14 AM  Result Value Ref Range   A-1 Antitrypsin 167 101 - 187 mg/dL      Assessment & Plan:   Problem List Items Addressed This Visit       Other   Lymphedema - Primary   Chronic, ongoing.  Recommend use of Tylenol as needed for pain, max 3000 MG total daily dosing.  Avoid Ibuprofen or NSAID products.  Continue using compression wraps daily and massage therapy.  Recommend return to vascular for a visit when possible.  Is unable to attend office visit due to chronic health issues and needs home based primary care, last referral did not go through as the provider was having family issues and is putting her practice on hold.  Will continue to look for resources to assist with this.      Cellulitis   Acute to posterior buttock -- skin intact per patient.  At this time will treat with Doxycycline  100 MG BID x 7 days and steroid taper which has worked well in past.  Monitor closely for skin breakdown and if presents alert provider immediately.  Denies s/s infection. To monitor temperature and skin closely.          Follow up plan: Return if symptoms worsen or fail to improve.

## 2024-02-28 ENCOUNTER — Encounter: Payer: Self-pay | Admitting: Nurse Practitioner

## 2024-03-05 NOTE — Addendum Note (Signed)
 Addended by: Allani Reber T on: 03/05/2024 12:17 PM   Modules accepted: Orders

## 2024-03-16 ENCOUNTER — Telehealth: Admitting: Nurse Practitioner

## 2024-03-16 ENCOUNTER — Encounter: Payer: Self-pay | Admitting: Nurse Practitioner

## 2024-03-16 VITALS — BP 152/82 | HR 120

## 2024-03-16 DIAGNOSIS — J45901 Unspecified asthma with (acute) exacerbation: Secondary | ICD-10-CM | POA: Insufficient documentation

## 2024-03-16 DIAGNOSIS — Z7409 Other reduced mobility: Secondary | ICD-10-CM | POA: Diagnosis not present

## 2024-03-16 DIAGNOSIS — J4521 Mild intermittent asthma with (acute) exacerbation: Secondary | ICD-10-CM | POA: Diagnosis not present

## 2024-03-16 DIAGNOSIS — I890 Lymphedema, not elsewhere classified: Secondary | ICD-10-CM

## 2024-03-16 MED ORDER — DOXYCYCLINE HYCLATE 100 MG PO TABS: 100.0000 mg | ORAL_TABLET | Freq: Two times a day (BID) | ORAL | 0 refills | Status: AC

## 2024-03-16 MED ORDER — PREDNISONE 20 MG PO TABS: 40.0000 mg | ORAL_TABLET | Freq: Every day | ORAL | 0 refills | Status: AC

## 2024-03-16 MED ORDER — BENZONATATE 100 MG PO CAPS: 100.0000 mg | ORAL_CAPSULE | Freq: Three times a day (TID) | ORAL | 0 refills | Status: AC | PRN

## 2024-03-16 NOTE — Progress Notes (Signed)
 BP (!) 152/82 (BP Location: Right Arm, Patient Position: Sitting, Cuff Size: Large)   Pulse (!) 120   LMP  (LMP Unknown)   SpO2 97%    Subjective:    Patient ID: [name] [name] [name], female    DOB: Sep 11, 1940, 83 y.o.   MRN: [id number]  HPI: [name] is a 83 y.o. female  Chief Complaint  Patient presents with   URI    Started last Wednesday, coughing, congestion, no appetite, sneezing, runny nose, no longer sinus pain/pressure, chest congestion, sore throat when it started. Has used Tussinex. Home bound   Virtual Visit via Video Note  I connected with [name] on 03/16/24 at  9:40 AM EDT by a video enabled telemedicine application and verified that I am speaking with the correct person using two identifiers.  Location: Patient: home Provider: work   I discussed the limitations of evaluation and management by telemedicine and the availability of in person appointments. The patient expressed understanding and agreed to proceed.  I discussed the assessment and treatment plan with the patient. The patient was provided an opportunity to ask questions and all were answered. The patient agreed with the plan and demonstrated an understanding of the instructions.   The patient was advised to call back or seek an in-person evaluation if the symptoms worsen or if the condition fails to improve as anticipated.  I provided 25 minutes of non-face-to-face time during this encounter.   [name] T Barbara Dusseau, NP   UPPER RESPIRATORY TRACT INFECTION Started to feel bad a week ago.  Daughter was sick first, she is a runner, broadcasting/film/video.   Fever: no Cough: yes Shortness of breath: no Wheezing: yes Chest pain: no Chest tightness: yes Chest congestion: yes Nasal congestion: yes Runny nose: yes Post nasal drip: yes Sneezing: no Sore throat: yes x one day Swollen glands: no Sinus pressure: yes Headache: yes Face pain: no Toothache: no Ear pain: none Ear pressure: yes bilateral Eyes red/itching:no Eye  drainage/crusting: no  Vomiting: yes nausea only Rash: no Fatigue: yes Sick contacts: yes Strep contacts: no  Context: fluctuating Recurrent sinusitis: no Relief with OTC cold/cough medications: yes  Treatments attempted: mucinex  and cough syrup, using nebulizer and inhalers   Relevant past medical, surgical, family and social history reviewed and updated as indicated. Interim medical history since our last visit reviewed. Allergies and medications reviewed and updated.  Review of Systems  Constitutional:  Positive for fatigue. Negative for activity change, appetite change, chills and fever.  HENT:  Positive for congestion, postnasal drip, rhinorrhea and sore throat. Negative for ear discharge, ear pain, facial swelling, sinus pain, sneezing and voice change.   Respiratory:  Positive for cough, chest tightness and wheezing. Negative for shortness of breath.   Cardiovascular:  Negative for chest pain, palpitations and leg swelling.  Gastrointestinal: Negative.   Neurological:  Positive for headaches. Negative for dizziness and numbness.  Psychiatric/Behavioral: Negative.      Per HPI unless specifically indicated above     Objective:    BP (!) 152/82 (BP Location: Right Arm, Patient Position: Sitting, Cuff Size: Large)   Pulse (!) 120   LMP  (LMP Unknown)   SpO2 97%   Wt Readings from Last 3 Encounters:  07/31/23 (!) 320 lb (145.2 kg)  07/15/22 (!) 340 lb (154.2 kg)  06/04/21 (!) 340 lb (154.2 kg)    Physical Exam Vitals and nursing note reviewed.  Constitutional:      General: She is awake. She is  not in acute distress.    Appearance: She is well-developed and well-groomed. She is obese. She is ill-appearing. She is not toxic-appearing.  HENT:     Head: Normocephalic.     Right Ear: Hearing normal.     Left Ear: Hearing normal.  Eyes:     General: Lids are normal.        Right eye: No discharge.        Left eye: No discharge.     Conjunctiva/sclera: Conjunctivae  normal.  Pulmonary:     Effort: Pulmonary effort is normal. No accessory muscle usage or respiratory distress.     Comments: No SOB with talking. Musculoskeletal:     Cervical back: Normal range of motion.  Neurological:     Mental Status: She is alert and oriented to person, place, and time.  Psychiatric:        Attention and Perception: Attention normal.        Mood and Affect: Mood normal.        Behavior: Behavior normal. Behavior is cooperative.        Thought Content: Thought content normal.        Judgment: Judgment normal.    Results for orders placed or performed in visit on 08/21/22  T4, free   Collection Time: 08/21/22 11:14 AM  Result Value Ref Range   Free T4 1.49 0.82 - 1.77 ng/dL  Thyroid  peroxidase antibody   Collection Time: 08/21/22 11:14 AM  Result Value Ref Range   Thyroperoxidase Ab SerPl-aCnc <9 0 - 34 IU/mL  TSH   Collection Time: 08/21/22 11:14 AM  Result Value Ref Range   TSH 3.360 0.450 - 4.500 uIU/mL  CBC with Differential/Platelet   Collection Time: 08/21/22 11:14 AM  Result Value Ref Range   WBC 4.7 3.4 - 10.8 x10E3/uL   RBC 4.71 3.77 - 5.28 x10E6/uL   Hemoglobin 14.3 11.1 - 15.9 g/dL   Hematocrit 56.5 65.9 - 46.6 %   MCV 92 79 - 97 fL   MCH 30.4 26.6 - 33.0 pg   MCHC 32.9 31.5 - 35.7 g/dL   RDW 86.5 88.2 - 84.5 %   Platelets 234 150 - 450 x10E3/uL   Neutrophils 56 Not Estab. %   Lymphs 35 Not Estab. %   Monocytes 9 Not Estab. %   Eos 0 Not Estab. %   Basos 0 Not Estab. %   Neutrophils Absolute 2.6 1.4 - 7.0 x10E3/uL   Lymphocytes Absolute 1.7 0.7 - 3.1 x10E3/uL   Monocytes Absolute 0.4 0.1 - 0.9 x10E3/uL   EOS (ABSOLUTE) 0.0 0.0 - 0.4 x10E3/uL   Basophils Absolute 0.0 0.0 - 0.2 x10E3/uL   Immature Granulocytes 0 Not Estab. %   Immature Grans (Abs) 0.0 0.0 - 0.1 x10E3/uL  Comprehensive metabolic panel   Collection Time: 08/21/22 11:14 AM  Result Value Ref Range   Glucose 90 70 - 99 mg/dL   BUN 19 8 - 27 mg/dL   Creatinine, Ser  9.05 0.57 - 1.00 mg/dL   eGFR 61 >40 fO/fpw/8.26   BUN/Creatinine Ratio 20 12 - 28   Sodium 139 134 - 144 mmol/L   Potassium 4.7 3.5 - 5.2 mmol/L   Chloride 103 96 - 106 mmol/L   CO2 19 (L) 20 - 29 mmol/L   Calcium  10.2 8.7 - 10.3 mg/dL   Total Protein 6.3 6.0 - 8.5 g/dL   Albumin 4.0 3.7 - 4.7 g/dL   Globulin, Total 2.3 1.5 - 4.5 g/dL  Albumin/Globulin Ratio 1.7 1.2 - 2.2   Bilirubin Total 0.6 0.0 - 1.2 mg/dL   Alkaline Phosphatase 86 44 - 121 IU/L   AST 15 0 - 40 IU/L   ALT 13 0 - 32 IU/L  Lipid Panel w/o Chol/HDL Ratio   Collection Time: 08/21/22 11:14 AM  Result Value Ref Range   Cholesterol, Total 222 (H) 100 - 199 mg/dL   Triglycerides 872 0 - 149 mg/dL   HDL 79 >60 mg/dL   VLDL Cholesterol Cal 22 5 - 40 mg/dL   LDL Chol Calc (NIH) 878 (H) 0 - 99 mg/dL  Magnesium   Collection Time: 08/21/22 11:14 AM  Result Value Ref Range   Magnesium 1.8 1.6 - 2.3 mg/dL  VITAMIN D  25 Hydroxy (Vit-D Deficiency, Fractures)   Collection Time: 08/21/22 11:14 AM  Result Value Ref Range   Vit D, 25-Hydroxy 68.6 30.0 - 100.0 ng/mL  Vitamin B12   Collection Time: 08/21/22 11:14 AM  Result Value Ref Range   Vitamin B-12 574 232 - 1,245 pg/mL  C-reactive protein   Collection Time: 08/21/22 11:14 AM  Result Value Ref Range   CRP <1 0 - 10 mg/L  Sed Rate (ESR)   Collection Time: 08/21/22 11:14 AM  Result Value Ref Range   Sed Rate 19 0 - 40 mm/hr  Alpha-1-antitrypsin   Collection Time: 08/21/22 11:14 AM  Result Value Ref Range   A-1 Antitrypsin 167 101 - 187 mg/dL      Assessment & Plan:   Problem List Items Addressed This Visit       Respiratory   Asthma exacerbation - Primary   Acute exacerbation of underlying chronic asthma. ?if started with flu based on symptoms, but did not test at home.  Unable to come to office due to mobility and chronic illnesses. Start Doxycycline  BID for 7 days, Prednisone  40 MG daily for 5 days, and Tessalon .  Recommend: - Increased rest - Increasing  Fluids - Acetaminophen / ibuprofen as needed for fever/pain.  - Salt water gargling, chloraseptic spray and throat lozenges - Mucinex .  - Saline sinus flushes or a neti pot.  - Humidifying the air.  - Strict ER precautions given. - New referral to find home based primary care for patient due to her current health      Relevant Medications   predniSONE  (DELTASONE ) 20 MG tablet     Other   Morbid obesity (HCC)   Relevant Orders   Ambulatory referral to Family Practice   Mobility impaired   Relevant Orders   Ambulatory referral to Family Practice   Lymphedema   Relevant Orders   Ambulatory referral to Family Practice     Follow up plan: Return if symptoms worsen or fail to improve.

## 2024-03-16 NOTE — Patient Instructions (Signed)

## 2024-03-16 NOTE — Assessment & Plan Note (Signed)
 Acute exacerbation of underlying chronic asthma. ?if started with flu based on symptoms, but did not test at home.  Unable to come to office due to mobility and chronic illnesses. Start Doxycycline  BID for 7 days, Prednisone  40 MG daily for 5 days, and Tessalon .  Recommend: - Increased rest - Increasing Fluids - Acetaminophen / ibuprofen as needed for fever/pain.  - Salt water gargling, chloraseptic spray and throat lozenges - Mucinex .  - Saline sinus flushes or a neti pot.  - Humidifying the air.  - Strict ER precautions given. - New referral to find home based primary care for patient due to her current health

## 2024-03-25 ENCOUNTER — Ambulatory Visit: Payer: Self-pay

## 2024-03-25 NOTE — Telephone Encounter (Signed)
 Called patient and notified her of the message from the provider. She acknowledged understanding.

## 2024-03-25 NOTE — Telephone Encounter (Signed)
 FYI Only or Action Required?: Action required by provider: update on patient condition.  Patient was last seen in primary care on 03/16/2024 by Cannady, [name] T, NP.  Called Nurse Triage reporting Cough.  Symptoms began a week ago.  Interventions attempted: Prescription medications: nebullizer, albuterol , prednisone , doxycycline , humifier at home.  Symptoms are: gradually worsening.  Triage Disposition: Call PCP Within 24 Hours  Patient/caregiver understands and will follow disposition?: Yes Pt states that her symptoms have improved overall, but she has an asthmatic cough that is persistent and productive. She noticed some wheezing yesterday but that was relieved with humidifier. No appts avail today will send HP message to provider for follow-up. Went ahead and scheduled next appt with provider for 11/7 via telehealth due to patient being home bound, but added to waitlist for sooner availability  Copied from CRM #[id number]. Topic: Clinical - Red Word Triage >> Mar 25, 2024 10:53 AM Treva T wrote: [facility] that prompted transfer to Nurse Triage: Patient calling states she still has a productive cough, with colored mucous, and it is not getting better, almost feels like an asthmatic cough, causing some shortness of breath.  Patient state recently seen in office, and wants to know if cough medication can be prescribed, or if she can be seen virtually for evaluation. Reason for Disposition  [1] Taking antibiotic > 72 hours (3 days) AND [2] symptoms (other than fever) not improved  Answer Assessment - Initial Assessment Questions 1. INFECTION: What infection is the antibiotic being given for?     Upper Respiratory Infection  2. ANTIBIOTIC: What antibiotic are you taking How many times per day?     Doxycycline   3. DURATION: When was the antibiotic started?     10/28  4. MAIN CONCERN OR SYMPTOM:  What is your main concern right now?     Persistent cough, no relief  5.  BETTER-SAME-WORSE: Are you getting better, staying the same, or getting worse compared to when you first started the antibiotics? If getting worse, ask: In what way?      Getting better, but cough lingers   6. FEVER: Do you have a fever? If Yes, ask: What is your temperature, how was it measured, and when did it start?     No  7. SYMPTOMS: Are there any other symptoms you're concerned about? If Yes, ask: When did it start?     Shortness of breath when coughing  8. FOLLOW-UP APPOINTMENT: Do you have a follow-up appointment with your doctor?     No  9. PREGNANCY: Is there any chance you are pregnant? When was your last menstrual period?     No  Protocols used: Infection on Antibiotic Follow-up Call-[facility]

## 2024-03-26 ENCOUNTER — Telehealth: Payer: Self-pay

## 2024-03-26 ENCOUNTER — Telehealth: Admitting: Nurse Practitioner

## 2024-03-26 ENCOUNTER — Encounter: Payer: Self-pay | Admitting: Nurse Practitioner

## 2024-03-26 VITALS — BP 119/79 | HR 100

## 2024-03-26 DIAGNOSIS — G4733 Obstructive sleep apnea (adult) (pediatric): Secondary | ICD-10-CM

## 2024-03-26 DIAGNOSIS — J4521 Mild intermittent asthma with (acute) exacerbation: Secondary | ICD-10-CM | POA: Diagnosis not present

## 2024-03-26 MED ORDER — DOXYCYCLINE HYCLATE 100 MG PO TABS: 100.0000 mg | ORAL_TABLET | Freq: Two times a day (BID) | ORAL | 0 refills | Status: AC

## 2024-03-26 MED ORDER — HYDROCOD POLI-CHLORPHE POLI ER 10-8 MG/5ML PO SUER: 5.0000 mL | Freq: Every evening | ORAL | 0 refills | Status: DC | PRN

## 2024-03-26 MED ORDER — PROMETHAZINE-DM 6.25-15 MG/5ML PO SYRP: 5.0000 mL | ORAL_SOLUTION | Freq: Four times a day (QID) | ORAL | 0 refills | Status: DC | PRN

## 2024-03-26 NOTE — Telephone Encounter (Signed)
 Called and notified patient that medication has already been changed for her as [name] was notified of her message when she called in.

## 2024-03-26 NOTE — Addendum Note (Signed)
 Addended by: Antonette Hendricks T on: 03/26/2024 04:33 PM   Modules accepted: Orders

## 2024-03-26 NOTE — Assessment & Plan Note (Addendum)
 Acute exacerbation of underlying chronic asthma, some improvement but not 100%. ?if started with flu based on symptoms, but did not test at home.  Unable to come to office due to mobility and chronic illnesses. Extend Doxycycline  BID for 5 days and send in cough syrup to help her rest.  She reported she prefers Tussionex, but codeine  is on her allergy list.  Recommend: - Increased rest - Increasing Fluids - Acetaminophen / ibuprofen as needed for fever/pain.  - Salt water gargling, chloraseptic spray and throat lozenges - Mucinex .  - Saline sinus flushes or a neti pot.  - Humidifying the air.  - Strict ER precautions given. We had at length discussion about limitation of virtual visits. If her symptoms are to worsen or continue she will need face to face visit or to go to ER for assessment.  She agrees with this plan and will be seen if worsening or ongoing.

## 2024-03-26 NOTE — Patient Instructions (Signed)
 Asthma, Adult  Asthma is a condition that causes swelling and narrowing of the airways. These are the passages that lead from the nose and mouth down into the lungs. When asthma symptoms get worse it is called an asthma attack or flare. This can make it hard to breathe. Asthma flares can range from minor to life-threatening. There is no cure for asthma, but medicines and lifestyle changes can help to control it. What are the causes? It is not known exactly what causes asthma, but certain things can cause asthma symptoms to get worse (triggers). What can trigger an asthma attack? Cigarette smoke. Mold. Dust. Your pet's skin flakes (dander). Cockroaches. Pollen. Air pollution (like household cleaners, wood smoke, smog, or Therapist, occupational). What are the signs or symptoms? Trouble breathing (shortness of breath). Coughing. Making high-pitched whistling sounds when you breathe, most often when you breathe out (wheezing). Chest tightness. Tiredness with little activity. Poor exercise tolerance. How is this treated? Controller medicines that help prevent asthma symptoms. Fast-acting reliever or rescue medicines. These give short-term relief of asthma symptoms. Allergy medicines if your attacks are brought on by allergens. Medicines to help control the body's defense (immune) system. Staying away from the things that cause asthma attacks. Follow these instructions at home: Avoiding triggers in your home Do not allow anyone to smoke in your home. Limit use of fireplaces and wood stoves. Get rid of pests (such as roaches and mice) and their droppings. Keep your home clean. Clean your floors. Dust regularly. Use cleaning products that do not smell. Wash bed sheets and blankets every week in hot water. Dry them in a dryer. Have someone vacuum when you are not home. Change your heating and air conditioning filters often. Use blankets that are made of polyester or cotton. General  instructions Take over-the-counter and prescription medicines only as told by your doctor. Do not smoke or use any products that contain nicotine or tobacco. If you need help quitting, ask your doctor. Stay away from secondhand smoke. Avoid doing things outdoors when allergen counts are high and when air quality is low. Warm up before you exercise. Take time to cool down after exercise. Use a peak flow meter as told by your doctor. A peak flow meter is a tool that measures how well your lungs are working. Keep track of the peak flow meter's readings. Write them down. Follow your asthma action plan. This is a written plan for taking care of your asthma and treating your attacks. Make sure you get all the shots (vaccines) that your doctor recommends. Ask your doctor about a flu shot and a pneumonia shot. Keep all follow-up visits. Contact a doctor if: You have wheezing, shortness of breath, or a cough even while taking medicine to prevent attacks. The mucus you cough up (sputum) is thicker than usual. The mucus you cough up changes from clear or white to yellow, green, gray, or is bloody. You have problems from the medicine you are taking, such as: A rash. Itching. Swelling. Trouble breathing. You need reliever medicines more than 2-3 times a week. Your peak flow reading is still at 50-79% of your personal best after following the action plan for 1 hour. You have a fever. Get help right away if: You seem to be worse and are not responding to medicine during an asthma attack. You are short of breath even at rest. You get short of breath when doing very little activity. You have trouble eating, drinking, or talking. You have chest  pain or tightness. You have a fast heartbeat. Your lips or fingernails start to turn blue. You are light-headed or dizzy, or you faint. Your peak flow is less than 50% of your personal best. You feel too tired to breathe normally. These symptoms may be an  emergency. Get help right away. Call 911. Do not wait to see if the symptoms will go away. Do not drive yourself to the hospital. Summary Asthma is a long-term (chronic) condition in which the airways get tight and narrow. An asthma attack can make it hard to breathe. Asthma cannot be cured, but medicines and lifestyle changes can help control it. Make sure you understand how to avoid triggers and how and when to use your medicines. Avoid things that can cause allergy symptoms (allergens). These include animal skin flakes (dander) and pollen from trees or grass. Avoid things that pollute the air. These may include household cleaners, wood smoke, smog, or chemical odors. This information is not intended to replace advice given to you by your health care provider. Make sure you discuss any questions you have with your health care provider. Document Revised: 02/12/2021 Document Reviewed: 02/12/2021 Elsevier Patient Education  2024 [employer].

## 2024-03-26 NOTE — Progress Notes (Addendum)
 BP 119/79   Pulse 100   LMP  (LMP Unknown)   SpO2 96%    Subjective:    Patient ID: [name], female    DOB: 1941/01/03, 83 y.o.   MRN: [id number]  HPI: [name] is a 83 y.o. female  Chief Complaint  Patient presents with   Cough    Patient states she finished her antibiotic 2 days ago. States she is still having a cough that is productive.    Virtual Visit via Video Note  I connected with [name] on 03/26/24 at  4:20 PM EST by a video enabled telemedicine application and verified that I am speaking with the correct person using two identifiers.  Location: Patient: home Provider: work   I discussed the limitations of evaluation and management by telemedicine and the availability of in person appointments. The patient expressed understanding and agreed to proceed.  I discussed the assessment and treatment plan with the patient. The patient was provided an opportunity to ask questions and all were answered. The patient agreed with the plan and demonstrated an understanding of the instructions.   The patient was advised to call back or seek an in-person evaluation if the symptoms worsen or if the condition fails to improve as anticipated.  I provided 25 minutes of non-face-to-face time during this encounter.   Arav Bannister T Divonte Senger, NP   UPPER RESPIRATORY TRACT INFECTION Treated on 03/16/24 for acute infection with Doxycycline  and Prednisone . Has underlying asthma and continues her inhalers.  Using Tessalon  as needed. Has not slept in awhile due to cough. Cough is productive and when has a spell it is hard to catch breath.  Her CPAP machine broke last week and she uses this religiously.  Last sleep study, 09/05/2017 [facility]  Last office visit with them was 10/15/2018. Fever: no Cough: yes Shortness of breath: no Wheezing: no Chest pain: no Chest tightness: no Chest congestion: no Nasal congestion: yes Runny nose: yes Post nasal drip:  yes Sneezing: no Sore throat: no Swollen glands: no Sinus pressure: no Headache: yes with coughing Face pain: no Toothache: no Ear pain: none Ear pressure: yes bilateral Eyes red/itching:no Eye drainage/crusting: no  Vomiting: no Rash: no Fatigue: yes Sick contacts: yes Strep contacts: no  Context: on a whole is better, with exception of cough Recurrent sinusitis: no Relief with OTC cold/cough medications: yes  Treatments attempted: mucinex  and cough syrup, using nebulizer, Doxycyline, and inhalers   Relevant past medical, surgical, family and social history reviewed and updated as indicated. Interim medical history since our last visit reviewed. Allergies and medications reviewed and updated.  Review of Systems  Constitutional:  Positive for fatigue. Negative for activity change, appetite change, chills and fever.  HENT:  Positive for congestion, postnasal drip and rhinorrhea. Negative for ear discharge, ear pain, facial swelling, sinus pain, sneezing, sore throat and voice change.   Respiratory:  Positive for cough. Negative for chest tightness, shortness of breath and wheezing.   Cardiovascular:  Negative for chest pain, palpitations and leg swelling.  Gastrointestinal: Negative.   Neurological:  Positive for headaches. Negative for dizziness and numbness.  Psychiatric/Behavioral: Negative.      Per HPI unless specifically indicated above     Objective:    BP 119/79   Pulse 100   LMP  (LMP Unknown)   SpO2 96%   Wt Readings from Last 3 Encounters:  07/31/23 (!) 320 lb (145.2 kg)  07/15/22 (!) 340 lb (  154.2 kg)  06/04/21 (!) 340 lb (154.2 kg)    Physical Exam Vitals and nursing note reviewed.  Constitutional:      General: She is awake. She is not in acute distress.    Appearance: She is well-developed and well-groomed. She is obese. She is ill-appearing. She is not toxic-appearing.  HENT:     Head: Normocephalic.     Right Ear: Hearing normal.     Left Ear:  Hearing normal.  Eyes:     General: Lids are normal.        Right eye: No discharge.        Left eye: No discharge.     Conjunctiva/sclera: Conjunctivae normal.  Pulmonary:     Effort: Pulmonary effort is normal. No accessory muscle usage or respiratory distress.     Comments: No SOB with talking. Musculoskeletal:     Cervical back: Normal range of motion.  Neurological:     Mental Status: She is alert and oriented to person, place, and time.  Psychiatric:        Attention and Perception: Attention normal.        Mood and Affect: Mood normal.        Behavior: Behavior normal. Behavior is cooperative.        Thought Content: Thought content normal.        Judgment: Judgment normal.    Results for orders placed or performed in visit on 08/21/22  T4, free   Collection Time: 08/21/22 11:14 AM  Result Value Ref Range   Free T4 1.49 0.82 - 1.77 ng/dL  Thyroid  peroxidase antibody   Collection Time: 08/21/22 11:14 AM  Result Value Ref Range   Thyroperoxidase Ab SerPl-aCnc <9 0 - 34 IU/mL  TSH   Collection Time: 08/21/22 11:14 AM  Result Value Ref Range   TSH 3.360 0.450 - 4.500 uIU/mL  CBC with Differential/Platelet   Collection Time: 08/21/22 11:14 AM  Result Value Ref Range   WBC 4.7 3.4 - 10.8 x10E3/uL   RBC 4.71 3.77 - 5.28 x10E6/uL   Hemoglobin 14.3 11.1 - 15.9 g/dL   Hematocrit 56.5 65.9 - 46.6 %   MCV 92 79 - 97 fL   MCH 30.4 26.6 - 33.0 pg   MCHC 32.9 31.5 - 35.7 g/dL   RDW 86.5 88.2 - 84.5 %   Platelets 234 150 - 450 x10E3/uL   Neutrophils 56 Not Estab. %   Lymphs 35 Not Estab. %   Monocytes 9 Not Estab. %   Eos 0 Not Estab. %   Basos 0 Not Estab. %   Neutrophils Absolute 2.6 1.4 - 7.0 x10E3/uL   Lymphocytes Absolute 1.7 0.7 - 3.1 x10E3/uL   Monocytes Absolute 0.4 0.1 - 0.9 x10E3/uL   EOS (ABSOLUTE) 0.0 0.0 - 0.4 x10E3/uL   Basophils Absolute 0.0 0.0 - 0.2 x10E3/uL   Immature Granulocytes 0 Not Estab. %   Immature Grans (Abs) 0.0 0.0 - 0.1 x10E3/uL   Comprehensive metabolic panel   Collection Time: 08/21/22 11:14 AM  Result Value Ref Range   Glucose 90 70 - 99 mg/dL   BUN 19 8 - 27 mg/dL   Creatinine, Ser 9.05 0.57 - 1.00 mg/dL   eGFR 61 >40 fO/fpw/8.26   BUN/Creatinine Ratio 20 12 - 28   Sodium 139 134 - 144 mmol/L   Potassium 4.7 3.5 - 5.2 mmol/L   Chloride 103 96 - 106 mmol/L   CO2 19 (L) 20 - 29 mmol/L   Calcium  10.2  8.7 - 10.3 mg/dL   Total Protein 6.3 6.0 - 8.5 g/dL   Albumin 4.0 3.7 - 4.7 g/dL   Globulin, Total 2.3 1.5 - 4.5 g/dL   Albumin/Globulin Ratio 1.7 1.2 - 2.2   Bilirubin Total 0.6 0.0 - 1.2 mg/dL   Alkaline Phosphatase 86 44 - 121 IU/L   AST 15 0 - 40 IU/L   ALT 13 0 - 32 IU/L  Lipid Panel w/o Chol/HDL Ratio   Collection Time: 08/21/22 11:14 AM  Result Value Ref Range   Cholesterol, Total 222 (H) 100 - 199 mg/dL   Triglycerides 872 0 - 149 mg/dL   HDL 79 >60 mg/dL   VLDL Cholesterol Cal 22 5 - 40 mg/dL   LDL Chol Calc (NIH) 878 (H) 0 - 99 mg/dL  Magnesium   Collection Time: 08/21/22 11:14 AM  Result Value Ref Range   Magnesium 1.8 1.6 - 2.3 mg/dL  VITAMIN D  25 Hydroxy (Vit-D Deficiency, Fractures)   Collection Time: 08/21/22 11:14 AM  Result Value Ref Range   Vit D, 25-Hydroxy 68.6 30.0 - 100.0 ng/mL  Vitamin B12   Collection Time: 08/21/22 11:14 AM  Result Value Ref Range   Vitamin B-12 574 232 - 1,245 pg/mL  C-reactive protein   Collection Time: 08/21/22 11:14 AM  Result Value Ref Range   CRP <1 0 - 10 mg/L  Sed Rate (ESR)   Collection Time: 08/21/22 11:14 AM  Result Value Ref Range   Sed Rate 19 0 - 40 mm/hr  Alpha-1-antitrypsin   Collection Time: 08/21/22 11:14 AM  Result Value Ref Range   A-1 Antitrypsin 167 101 - 187 mg/dL      Assessment & Plan:   Problem List Items Addressed This Visit       Respiratory   OSA (obstructive sleep apnea)   Chronic, stable.  Continue 100% use of CPAP at night. Will work on obtaining her a new CPAP machine. Her last sleep study was 09/05/2017.       Asthma exacerbation - Primary   Acute exacerbation of underlying chronic asthma, some improvement but not 100%. ?if started with flu based on symptoms, but did not test at home.  Unable to come to office due to mobility and chronic illnesses. Extend Doxycycline  BID for 5 days and send in cough syrup to help her rest.  She reported she prefers Tussionex, but codeine  is on her allergy list.  Recommend: - Increased rest - Increasing Fluids - Acetaminophen / ibuprofen as needed for fever/pain.  - Salt water gargling, chloraseptic spray and throat lozenges - Mucinex .  - Saline sinus flushes or a neti pot.  - Humidifying the air.  - Strict ER precautions given. We had at length discussion about limitation of virtual visits. If her symptoms are to worsen or continue she will need face to face visit or to go to ER for assessment.  She agrees with this plan and will be seen if worsening or ongoing.         Follow up plan: Return if symptoms worsen or fail to improve.

## 2024-03-26 NOTE — Assessment & Plan Note (Signed)
 Chronic, stable.  Continue 100% use of CPAP at night. Will work on obtaining her a new CPAP machine. Her last sleep study was 09/05/2017.

## 2024-03-26 NOTE — Telephone Encounter (Signed)
 Copied from CRM #[id number]. Topic: Clinical - Prescription Issue >> Mar 26, 2024  4:19 PM Fonda T wrote: Reason for CRM: Patient calling reports she was prescribed a medication that makes her sick.  Medication: chlorpheniramine-HYDROcodone (TUSSIONEX) 10-8 MG/5ML.   Called and spoke to front office, advised to send CRM to clinic.  Patient can be reached back at [phone].

## 2024-05-05 NOTE — Progress Notes (Signed)
 Pharmacy Quality Measure Review  This patient is appearing on a report for being at risk of failing the adherence measure for hypertension (ACEi/ARB) medications this calendar year.   Medication: irbesartan  300 mg Last fill date: 04/05/24 for 90 day supply  Insurance report was not up to date. No action needed at this time.   Jenkins Graces, PharmD PGY1 Pharmacy Resident

## 2024-06-02 ENCOUNTER — Encounter: Payer: Self-pay | Admitting: Nurse Practitioner

## 2024-06-04 ENCOUNTER — Encounter: Payer: Self-pay | Admitting: Nurse Practitioner

## 2024-06-04 ENCOUNTER — Telehealth (INDEPENDENT_AMBULATORY_CARE_PROVIDER_SITE_OTHER): Admitting: Nurse Practitioner

## 2024-06-04 DIAGNOSIS — F3341 Major depressive disorder, recurrent, in partial remission: Secondary | ICD-10-CM

## 2024-06-04 DIAGNOSIS — I10 Essential (primary) hypertension: Secondary | ICD-10-CM

## 2024-06-04 DIAGNOSIS — Z6841 Body Mass Index (BMI) 40.0 and over, adult: Secondary | ICD-10-CM | POA: Diagnosis not present

## 2024-06-04 DIAGNOSIS — F32A Depression, unspecified: Secondary | ICD-10-CM

## 2024-06-04 DIAGNOSIS — I890 Lymphedema, not elsewhere classified: Secondary | ICD-10-CM | POA: Diagnosis not present

## 2024-06-04 DIAGNOSIS — R000 Tachycardia, unspecified: Secondary | ICD-10-CM

## 2024-06-04 DIAGNOSIS — L03317 Cellulitis of buttock: Secondary | ICD-10-CM

## 2024-06-04 DIAGNOSIS — J4540 Moderate persistent asthma, uncomplicated: Secondary | ICD-10-CM

## 2024-06-04 MED ORDER — ALBUTEROL SULFATE HFA 108 (90 BASE) MCG/ACT IN AERS: 2.0000 | INHALATION_SPRAY | Freq: Four times a day (QID) | RESPIRATORY_TRACT | 4 refills | Status: AC | PRN

## 2024-06-04 MED ORDER — BUPROPION HCL ER (XL) 300 MG PO TB24: 300.0000 mg | ORAL_TABLET | Freq: Every day | ORAL | 0 refills | Status: AC

## 2024-06-04 MED ORDER — FUROSEMIDE 40 MG PO TABS: ORAL_TABLET | ORAL | 0 refills | Status: AC

## 2024-06-04 MED ORDER — METHYLPREDNISOLONE 4 MG PO TBPK: ORAL_TABLET | ORAL | 0 refills | Status: AC

## 2024-06-04 MED ORDER — DOXYCYCLINE HYCLATE 100 MG PO TABS: 100.0000 mg | ORAL_TABLET | Freq: Two times a day (BID) | ORAL | 0 refills | Status: AC

## 2024-06-04 MED ORDER — FLUTICASONE-SALMETEROL 250-50 MCG/ACT IN AEPB: 1.0000 | INHALATION_SPRAY | Freq: Two times a day (BID) | RESPIRATORY_TRACT | 4 refills | Status: AC

## 2024-06-04 MED ORDER — IRBESARTAN 300 MG PO TABS: ORAL_TABLET | ORAL | 0 refills | Status: AC

## 2024-06-04 MED ORDER — NEBIVOLOL HCL 10 MG PO TABS: 30.0000 mg | ORAL_TABLET | Freq: Every day | ORAL | Status: AC

## 2024-06-04 MED ORDER — SPIRIVA RESPIMAT 1.25 MCG/ACT IN AERS: 2.0000 | INHALATION_SPRAY | Freq: Every day | RESPIRATORY_TRACT | 3 refills | Status: AC

## 2024-06-04 MED ORDER — OMEPRAZOLE 20 MG PO CPDR: DELAYED_RELEASE_CAPSULE | ORAL | 0 refills | Status: AC

## 2024-06-04 NOTE — Assessment & Plan Note (Signed)
 Refer to morbid obesity plan of care.

## 2024-06-04 NOTE — Progress Notes (Signed)
 "  BP 139/70 (BP Location: Right Arm, Patient Position: Sitting, Cuff Size: Normal)   Pulse (!) 123   Temp (!) 96.8 F (36 C) (Oral)   Ht 5' 3.5 (1.613 m)   Wt (!) 320 lb (145.2 kg)   LMP  (LMP Unknown)   SpO2 98%   BMI 55.79 kg/m    Subjective:    Patient ID: [name], female    DOB: [birth year], 84 y.o.   MRN: [id number]  HPI: [name] is a 84 y.o. female  Chief Complaint  Patient presents with   Celluitis     Cellulitis on buttocks   Other    Would like to know if we are getting closer to someone coming into her home   Virtual Visit via Video Note  I connected with [name] on 06/04/24 at  4:20 PM EST by a video enabled telemedicine application and verified that I am speaking with the correct person using two identifiers.  Location: Patient: home Provider: work   I discussed the limitations of evaluation and management by telemedicine and the availability of in person appointments. The patient expressed understanding and agreed to proceed.  I discussed the assessment and treatment plan with the patient. The patient was provided an opportunity to ask questions and all were answered. The patient agreed with the plan and demonstrated an understanding of the instructions.   The patient was advised to call back or seek an in-person evaluation if the symptoms worsen or if the condition fails to improve as anticipated.  I provided 25 minutes of non-face-to-face time during this encounter.   Selma Mink T Azreal Stthomas, NP   SKIN INFECTION Reports having cellulitis that started the first of the week, to buttocks area. Last treated for cellulitis in October 2025. HR fluctuates from 100 to 120 range lately. She did increase Bystolic  to 20 MG and this did help bring level down some. Denies any palpitations or CP with this. Have been looking for homebound provider for her as she is unable to leave the home.  Duration: days Location: buttocks History of trauma in area: no Pain: no  - burning and itching Quality: no Severity: none Redness: yes Swelling: yes Oozing: no Pus: no Fevers: no Nausea/vomiting: no Status: stable Treatments attempted:none  Tetanus: UTD      06/04/2024    4:23 PM 03/16/2024    9:58 AM 12/23/2023   10:36 AM 07/31/2023   10:59 AM 06/17/2023   10:07 AM  Depression screen PHQ 2/9  Decreased Interest 0 0 0 0 0  Down, Depressed, Hopeless 0 0 0 0 0  PHQ - 2 Score 0 0 0 0 0  Altered sleeping 0 0 0 0 1  Tired, decreased energy 1 0 1 1 2   Change in appetite 0 0 0 0 0  Feeling bad or failure about yourself  0 0 0 0 0  Trouble concentrating 0 0 0 0 0  Moving slowly or fidgety/restless 0 0 0 0 0  Suicidal thoughts 0 0 0 0 0  PHQ-9 Score 1 0  1  1  3    Difficult doing work/chores Not difficult at all  Not difficult at all Not difficult at all Not difficult at all     Data saved with a previous flowsheet row definition       06/04/2024    4:25 PM 03/16/2024    9:58 AM 12/23/2023   10:37 AM 06/17/2023   10:08 AM  GAD 7 :  Generalized Anxiety Score  Nervous, Anxious, on Edge 0 0 0 1  Control/stop worrying 1 0 0 3  Worry too much - different things 1 0 0 3  Trouble relaxing 1 0 0 0  Restless 0 0 0 0  Easily annoyed or irritable 0 0 0 0  Afraid - awful might happen 0 0 0 0  Total GAD 7 Score 3 0 0 7  Anxiety Difficulty Not difficult at all  Not difficult at all Not difficult at all      Relevant past medical, surgical, family and social history reviewed and updated as indicated. Interim medical history since our last visit reviewed. Allergies and medications reviewed and updated.  Review of Systems  Constitutional:  Negative for activity change, appetite change, diaphoresis, fatigue and fever.  Respiratory:  Positive for shortness of breath (at baseline). Negative for cough, chest tightness and wheezing.   Cardiovascular:  Positive for leg swelling (at baseline). Negative for chest pain and palpitations.  Gastrointestinal: Negative.    Endocrine: Negative.   Neurological: Negative.   Psychiatric/Behavioral: Negative.      Per HPI unless specifically indicated above     Objective:    BP 139/70 (BP Location: Right Arm, Patient Position: Sitting, Cuff Size: Normal)   Pulse (!) 123   Temp (!) 96.8 F (36 C) (Oral)   Ht 5' 3.5 (1.613 m)   Wt (!) 320 lb (145.2 kg)   LMP  (LMP Unknown)   SpO2 98%   BMI 55.79 kg/m   Wt Readings from Last 3 Encounters:  06/04/24 (!) 320 lb (145.2 kg)  07/31/23 (!) 320 lb (145.2 kg)  07/15/22 (!) 340 lb (154.2 kg)    Physical Exam Vitals and nursing note reviewed.  Constitutional:      General: She is awake. She is not in acute distress.    Appearance: She is well-developed and well-groomed. She is obese. She is not ill-appearing or toxic-appearing.  HENT:     Head: Normocephalic.     Right Ear: Hearing normal.     Left Ear: Hearing normal.  Eyes:     General: Lids are normal.        Right eye: No discharge.        Left eye: No discharge.     Conjunctiva/sclera: Conjunctivae normal.  Pulmonary:     Effort: Pulmonary effort is normal. No accessory muscle usage or respiratory distress.  Musculoskeletal:     Cervical back: Normal range of motion.  Neurological:     Mental Status: She is alert and oriented to person, place, and time.  Psychiatric:        Attention and Perception: Attention normal.        Mood and Affect: Mood normal.        Behavior: Behavior normal. Behavior is cooperative.        Thought Content: Thought content normal.        Judgment: Judgment normal.    Results for orders placed or performed in visit on 08/21/22  T4, free   Collection Time: 08/21/22 11:14 AM  Result Value Ref Range   Free T4 1.49 0.82 - 1.77 ng/dL  Thyroid  peroxidase antibody   Collection Time: 08/21/22 11:14 AM  Result Value Ref Range   Thyroperoxidase Ab SerPl-aCnc <9 0 - 34 IU/mL  TSH   Collection Time: 08/21/22 11:14 AM  Result Value Ref Range   TSH 3.360 0.450 - 4.500  uIU/mL  CBC with Differential/Platelet   Collection Time:  08/21/22 11:14 AM  Result Value Ref Range   WBC 4.7 3.4 - 10.8 x10E3/uL   RBC 4.71 3.77 - 5.28 x10E6/uL   Hemoglobin 14.3 11.1 - 15.9 g/dL   Hematocrit 56.5 65.9 - 46.6 %   MCV 92 79 - 97 fL   MCH 30.4 26.6 - 33.0 pg   MCHC 32.9 31.5 - 35.7 g/dL   RDW 86.5 88.2 - 84.5 %   Platelets 234 150 - 450 x10E3/uL   Neutrophils 56 Not Estab. %   Lymphs 35 Not Estab. %   Monocytes 9 Not Estab. %   Eos 0 Not Estab. %   Basos 0 Not Estab. %   Neutrophils Absolute 2.6 1.4 - 7.0 x10E3/uL   Lymphocytes Absolute 1.7 0.7 - 3.1 x10E3/uL   Monocytes Absolute 0.4 0.1 - 0.9 x10E3/uL   EOS (ABSOLUTE) 0.0 0.0 - 0.4 x10E3/uL   Basophils Absolute 0.0 0.0 - 0.2 x10E3/uL   Immature Granulocytes 0 Not Estab. %   Immature Grans (Abs) 0.0 0.0 - 0.1 x10E3/uL  Comprehensive metabolic panel   Collection Time: 08/21/22 11:14 AM  Result Value Ref Range   Glucose 90 70 - 99 mg/dL   BUN 19 8 - 27 mg/dL   Creatinine, Ser 9.05 0.57 - 1.00 mg/dL   eGFR 61 >40 fO/fpw/8.26   BUN/Creatinine Ratio 20 12 - 28   Sodium 139 134 - 144 mmol/L   Potassium 4.7 3.5 - 5.2 mmol/L   Chloride 103 96 - 106 mmol/L   CO2 19 (L) 20 - 29 mmol/L   Calcium  10.2 8.7 - 10.3 mg/dL   Total Protein 6.3 6.0 - 8.5 g/dL   Albumin 4.0 3.7 - 4.7 g/dL   Globulin, Total 2.3 1.5 - 4.5 g/dL   Albumin/Globulin Ratio 1.7 1.2 - 2.2   Bilirubin Total 0.6 0.0 - 1.2 mg/dL   Alkaline Phosphatase 86 44 - 121 IU/L   AST 15 0 - 40 IU/L   ALT 13 0 - 32 IU/L  Lipid Panel w/o Chol/HDL Ratio   Collection Time: 08/21/22 11:14 AM  Result Value Ref Range   Cholesterol, Total 222 (H) 100 - 199 mg/dL   Triglycerides 872 0 - 149 mg/dL   HDL 79 >60 mg/dL   VLDL Cholesterol Cal 22 5 - 40 mg/dL   LDL Chol Calc (NIH) 878 (H) 0 - 99 mg/dL  Magnesium   Collection Time: 08/21/22 11:14 AM  Result Value Ref Range   Magnesium 1.8 1.6 - 2.3 mg/dL  VITAMIN D  25 Hydroxy (Vit-D Deficiency, Fractures)    Collection Time: 08/21/22 11:14 AM  Result Value Ref Range   Vit D, 25-Hydroxy 68.6 30.0 - 100.0 ng/mL  Vitamin B12   Collection Time: 08/21/22 11:14 AM  Result Value Ref Range   Vitamin B-12 574 232 - 1,245 pg/mL  C-reactive protein   Collection Time: 08/21/22 11:14 AM  Result Value Ref Range   CRP <1 0 - 10 mg/L  Sed Rate (ESR)   Collection Time: 08/21/22 11:14 AM  Result Value Ref Range   Sed Rate 19 0 - 40 mm/hr  Alpha-1-antitrypsin   Collection Time: 08/21/22 11:14 AM  Result Value Ref Range   A-1 Antitrypsin 167 101 - 187 mg/dL      Assessment & Plan:   Problem List Items Addressed This Visit       Cardiovascular and Mediastinum   Essential hypertension, benign   Chronic, stable.  BP at goal on her home checks. Collaboration with cardiology in  past -- recommend she return for follow-up with them but she would need ambulance for transportation there.  Recommend she monitor BP at least a few mornings a week at home and document.  DASH diet at home.  Labs: unable to obtain. Have attempted to find homebound provider without success at this time. Will see if palliative can assist with home visits and assessment, working with PCP until a homebound PCP can be found.       Relevant Medications   nebivolol  (BYSTOLIC ) 10 MG tablet   furosemide  (LASIX ) 40 MG tablet   irbesartan  (AVAPRO ) 300 MG tablet   Other Relevant Orders   Amb Referral to Palliative Care     Respiratory   Asthma in adult, moderate persistent, uncomplicated   Chronic, stable at this time.  Continue current inhaler regimen and adjust as needed.  Refills sent.  Treat exacerbations as needed.  Pollen is a trigger for her. Continue allergy medications as ordered.      Relevant Medications   fluticasone -salmeterol (WIXELA INHUB) 250-50 MCG/ACT AEPB   Tiotropium Bromide  (SPIRIVA  RESPIMAT) 1.25 MCG/ACT AERS   albuterol  (VENTOLIN  HFA) 108 (90 Base) MCG/ACT inhaler   methylPREDNISolone  (MEDROL  DOSEPAK) 4 MG TBPK  tablet   Other Relevant Orders   Amb Referral to Palliative Care     Other   Tachycardia   Ongoing, some improvement with Bystolic  increase. Denies any red flag symptoms. Will increase Bystolic  to 30 MG daily, educated her on this change, but recommend she return to 20 MG dosing if HR consistently trends to <60.  Continue Avapro  at current dose. Order for Zio to further assess. Will place referral to palliative to see if they can assist in assessing patient in her home. Have attempted via multiple resources to attain homebound care for her as she is unable to leave home without an ambulance due to mobility and obesity. - Is aware that if any abnormal findings on Zio she will need to see cardiology, she reports she will need ambulance for this.      Relevant Orders   LONG TERM MONITOR XT (3-14 DAYS)   Morbid obesity (HCC) - Primary   BMI 59.28 last visit with underlying HTN and ASTHMA, she is unable to weigh self at home and due to mobility cannot come to office.  We are working on getting primary care into her home, but have been unable to find someone. Will see if palliative will be able to go into the home.. Recommended eating smaller high protein, low fat meals more frequently and exercising 30 mins a day 5 times a week with a goal of 10-15lb weight loss in the next 3 months. Patient voiced their understanding and motivation to adhere to these recommendations.       Relevant Orders   Amb Referral to Palliative Care   Lymphedema   Chronic, ongoing.  Recommend use of Tylenol as needed for pain, max 3000 MG total daily dosing.  Avoid Ibuprofen or NSAID products.  Continue using compression wraps daily and massage therapy.  Recommend return to vascular for a visit when possible.  Labs: unable to obtain. Have attempted to find homebound provider without success at this time. Will see if palliative can assist with home visits and assessment, working with PCP until a homebound PCP can be found.       Relevant Medications   furosemide  (LASIX ) 40 MG tablet   Other Relevant Orders   Amb Referral to Palliative Care   Depression  Chronic, stable.  Denies SI/HI.  Continue current medication regimen and adjust as needed. Scores remain stable.      Relevant Medications   buPROPion  (WELLBUTRIN  XL) 300 MG 24 hr tablet   Cellulitis   Acute to posterior buttock -- skin intact per patient.  At this time will treat with Doxycycline  100 MG BID x 7 days and steroid taper which has worked well in past.  Monitor closely for skin breakdown and if presents alert provider immediately.  Denies s/s infection. To monitor temperature and skin closely.  Have attempted to find homebound provider without success at this time. Will see if palliative can assist with home visits and assessment, working with PCP until a homebound PCP can be found.      BMI 50.0-59.9, adult [facility])   Refer to morbid obesity plan of care.        Follow up plan: Return in about 5 weeks (around 07/09/2024) for Tachycardia.      "

## 2024-06-04 NOTE — Assessment & Plan Note (Signed)
 Chronic, stable.  BP at goal on her home checks. Collaboration with cardiology in past -- recommend she return for follow-up with them but she would need ambulance for transportation there.  Recommend she monitor BP at least a few mornings a week at home and document.  DASH diet at home.  Labs: unable to obtain. Have attempted to find homebound provider without success at this time. Will see if palliative can assist with home visits and assessment, working with PCP until a homebound PCP can be found.

## 2024-06-04 NOTE — Assessment & Plan Note (Signed)
 Chronic, ongoing.  Recommend use of Tylenol as needed for pain, max 3000 MG total daily dosing.  Avoid Ibuprofen or NSAID products.  Continue using compression wraps daily and massage therapy.  Recommend return to vascular for a visit when possible.  Labs: unable to obtain. Have attempted to find homebound provider without success at this time. Will see if palliative can assist with home visits and assessment, working with PCP until a homebound PCP can be found.

## 2024-06-04 NOTE — Assessment & Plan Note (Signed)
 Chronic, stable at this time.  Continue current inhaler regimen and adjust as needed.  Refills sent.  Treat exacerbations as needed.  Pollen is a trigger for her. Continue allergy medications as ordered.

## 2024-06-04 NOTE — Assessment & Plan Note (Signed)
 Chronic, stable.  Denies SI/HI.  Continue current medication regimen and adjust as needed. Scores remain stable.

## 2024-06-04 NOTE — Assessment & Plan Note (Signed)
 BMI 59.28 last visit with underlying HTN and ASTHMA, she is unable to weigh self at home and due to mobility cannot come to office.  We are working on getting primary care into her home, but have been unable to find someone. Will see if palliative will be able to go into the home.. Recommended eating smaller high protein, low fat meals more frequently and exercising 30 mins a day 5 times a week with a goal of 10-15lb weight loss in the next 3 months. Patient voiced their understanding and motivation to adhere to these recommendations.

## 2024-06-04 NOTE — Patient Instructions (Signed)
 Cellulitis, Adult    Cellulitis is a skin infection. The infected area is often warm, red, swollen, and sore. It occurs most often on the legs, feet, and toes, but can happen on any part of the body.  This condition can be life-threatening without treatment. It is very important to get treated right away.  What are the causes?  This condition is caused by bacteria. The bacteria enter through a break in the skin, such as:  A cut.  A burn.  A bug bite.  An animal bite.  An open sore.  A crack.  What increases the risk?  Having a weak body's defense system (immune system).  Being older than 84 years old.  Having a blood sugar problem (diabetes).  Having a long-term liver disease (cirrhosis) or kidney disease.  Being very overweight (obese).  Having a skin problem, such as:  An itchy rash.  A rash caused by a fungus.  A rash with blisters.  Slow movement of blood in the veins (venous stasis).  Fluid buildup below the skin (edema).  This condition is more likely to occur in people who:  Have open cuts, burns, bites, or scrapes on the skin.  Have been treated with high-energy rays (radiation).  Use IV drugs.  What are the signs or symptoms?  Skin that:  Looks red or purple, or slightly darker than your usual skin color.  Has streaks.  Has spots.  Is swollen.  Is sore or painful when you touch it.  Is warm.  A fever.  Chills.  Blisters.  Tiredness (fatigue).  How is this treated?  Medicines to treat infections or allergies.  Rest.  Placing cold or warm cloths on the skin.  Staying in the hospital, if the condition is very bad. You may need medicines through an IV.  Follow these instructions at home:  Medicines  Take over-the-counter and prescription medicines only as told by your doctor.  If you were prescribed antibiotics, take them as told by your doctor. Do not stop using them even if you start to feel better.  General instructions  Drink enough fluid to keep your pee (urine) pale yellow.  Do not touch or rub the  infected area.  Raise (elevate) the infected area above the level of your heart while you are sitting or lying down.  Return to your normal activities when your doctor says that it is safe.  Place cold or warm cloths on the area as told by your doctor.  Keep all follow-up visits. Your doctor will need to make sure that a more serious infection is not developing.  Contact a doctor if:  You have a fever.  You do not start to get better after 1-2 days of treatment.  Your bone or joint under the infected area starts to hurt after the skin has healed.  Your infection comes back in the same area or another area. Signs of this may include:  You have a swollen bump in the area.  Your red area gets larger, turns dark in color, or hurts more.  You have more fluid coming from the wound.  Pus or a bad smell develops in your infected area.  You have more pain.  You feel sick and have muscle aches and weakness.  You develop vomiting or watery poop that will not go away.  Get help right away if:  You see red streaks coming from the area.  You notice the skin turns purple or black and falls  off.  These symptoms may be an emergency. Get help right away. Call 911.  Do not wait to see if the symptoms will go away.  Do not drive yourself to the hospital.  This information is not intended to replace advice given to you by your health care provider. Make sure you discuss any questions you have with your health care provider.  Document Revised: 01/01/2022 Document Reviewed: 01/01/2022  Elsevier Patient Education  2024 [employer].

## 2024-06-04 NOTE — Assessment & Plan Note (Signed)
 Acute to posterior buttock -- skin intact per patient.  At this time will treat with Doxycycline  100 MG BID x 7 days and steroid taper which has worked well in past.  Monitor closely for skin breakdown and if presents alert provider immediately.  Denies s/s infection. To monitor temperature and skin closely.  Have attempted to find homebound provider without success at this time. Will see if palliative can assist with home visits and assessment, working with PCP until a homebound PCP can be found.

## 2024-06-04 NOTE — Assessment & Plan Note (Signed)
 Ongoing, some improvement with Bystolic  increase. Denies any red flag symptoms. Will increase Bystolic  to 30 MG daily, educated her on this change, but recommend she return to 20 MG dosing if HR consistently trends to <60.  Continue Avapro  at current dose. Order for Zio to further assess. Will place referral to palliative to see if they can assist in assessing patient in her home. Have attempted via multiple resources to attain homebound care for her as she is unable to leave home without an ambulance due to mobility and obesity. - Is aware that if any abnormal findings on Zio she will need to see cardiology, she reports she will need ambulance for this.

## 2024-06-05 ENCOUNTER — Encounter: Payer: Self-pay | Admitting: Nurse Practitioner

## 2024-06-07 NOTE — Progress Notes (Signed)
 Appt scheduled

## 2024-06-10 NOTE — Addendum Note (Signed)
 Addended by: Ameliah Baskins T on: 06/10/2024 11:33 AM   Modules accepted: Orders

## 2024-06-19 NOTE — Patient Instructions (Signed)
 DASH (Dietary Approaches to Stop Hypertension): Eating Plan  DASH stands for Dietary Approaches to Stop Hypertension. The DASH eating plan is a healthy eating plan that has been shown to: Lower high blood pressure (hypertension). Reduce your risk for type 2 diabetes, heart disease, and stroke. Help with weight loss. What are tips for following this plan? Reading food labels Check food labels for the amount of salt (sodium) per serving. Choose foods with less than 5 percent of the Daily Value (DV) of sodium. In general, foods with less than 300 milligrams (mg) of sodium per serving fit into this eating plan. To find whole grains, look for the word whole as the first word in the ingredient list. Shopping Buy products labeled as low-sodium or no salt added. Buy fresh foods. Avoid canned foods and pre-made or frozen meals. Cooking Try not to add salt when you cook. Use salt-free seasonings or herbs instead of table salt or sea salt. Check with your health care provider or pharmacist before using salt substitutes. Do not fry foods. Cook foods in healthy ways, such as baking, boiling, grilling, roasting, or broiling. Cook using oils that are good for your heart. These include olive, canola, avocado, soybean, and sunflower oil. Meal planning  Eat a balanced diet. This should include: 4 or more servings of fruits and 4 or more servings of vegetables each day. Try to fill half of your plate with fruits and vegetables. 6-8 servings of whole grains each day. 6 or less servings of lean meat, poultry, or fish each day. 1 oz is 1 serving. A 3 oz (85 g) serving of meat is about the same size as the palm of your hand. One egg is 1 oz (28 g). 2-3 servings of low-fat dairy each day. One serving is 1 cup (237 mL). 1 serving of nuts, seeds, or beans 5 times each week. 2-3 servings of heart-healthy fats. Healthy fats called omega-3 fatty acids are found in foods such as walnuts, flaxseeds, fortified milks,  and eggs. These fats are also found in cold-water fish, such as sardines, salmon, and mackerel. Limit how much you eat of: Canned or prepackaged foods. Food that is high in trans fat, such as fried foods. Food that is high in saturated fat, such as fatty meat. Desserts and other sweets, sugary drinks, and other foods with added sugar. Full-fat dairy products. Do not salt foods before eating. Do not eat more than 4 egg yolks a week. Try to eat at least 2 vegetarian meals a week. Eat more home-cooked food and less restaurant, buffet, and fast food. Lifestyle When eating at a restaurant, ask if your food can be made with less salt or no salt. If you drink alcohol: Limit how much you have to: 0-1 drink a day if you are female. 0-2 drinks a day if you are female. Know how much alcohol is in your drink. In the [place] one drink is one 12 oz bottle of beer (355 mL), one 5 oz glass of wine (148 mL), or one 1 oz glass of hard liquor (44 mL). General information Avoid eating more than 2,300 mg of salt a day. If you have hypertension, you may need to reduce your sodium intake to 1,500 mg a day. Work with your provider to stay at a healthy body weight or lose weight. Ask what the best weight range is for you. On most days of the week, get at least 30 minutes of exercise that causes your heart to beat faster.  This may include walking, swimming, or biking. Work with your provider or dietitian to adjust your eating plan to meet your specific calorie needs. What foods should I eat? Fruits All fresh, dried, or frozen fruit. Canned fruits that are in their natural juice and do not have sugar added to them. Vegetables Fresh or frozen vegetables that are raw, steamed, roasted, or grilled. Low-sodium or reduced-sodium tomato and vegetable juice. Low-sodium or reduced-sodium tomato sauce and tomato paste. Low-sodium or reduced-sodium canned vegetables. Grains Whole-grain or whole-wheat bread. Whole-grain or  whole-wheat pasta. Brown rice. [place]il Madeira. Bulgur. Whole-grain and low-sodium cereals. Pita bread. Low-fat, low-sodium crackers. Whole-wheat flour tortillas. Meats and other proteins Skinless chicken or turkey. Ground chicken or turkey. Pork with fat trimmed off. Fish and seafood. Egg whites. Dried beans, peas, or lentils. Unsalted nuts, nut butters, and seeds. Unsalted canned beans. Lean cuts of beef with fat trimmed off. Low-sodium, lean precooked or cured meat, such as sausages or meat loaves. Dairy Low-fat (1%) or fat-free (skim) milk. Reduced-fat, low-fat, or fat-free cheeses. Nonfat, low-sodium ricotta or cottage cheese. Low-fat or nonfat yogurt. Low-fat, low-sodium cheese. Fats and oils Soft margarine without trans fats. Vegetable oil. Reduced-fat, low-fat, or light mayonnaise and salad dressings (reduced-sodium). Canola, safflower, olive, avocado, soybean, and sunflower oils. Avocado. Seasonings and condiments Herbs. Spices. Seasoning mixes without salt. Other foods Unsalted popcorn and pretzels. Fat-free sweets. The items listed above may not be all the foods and drinks you can have. Talk to a dietitian to learn more. What foods should I avoid? Fruits Canned fruit in a light or heavy syrup. Fried fruit. Fruit in cream or butter sauce. Vegetables Creamed or fried vegetables. Vegetables in a cheese sauce. Regular canned vegetables that are not marked as low-sodium or reduced-sodium. Regular canned tomato sauce and paste that are not marked as low-sodium or reduced-sodium. Regular tomato and vegetable juices that are not marked as low-sodium or reduced-sodium. Dene. Olives. Grains Baked goods made with fat, such as croissants, muffins, or some breads. Dry pasta or rice meal packs. Meats and other proteins Fatty cuts of meat. Ribs. Fried meat. [place]. Bologna, salami, and other precooked or cured meats, such as sausages or meat loaves, that are not lean and low in sodium. Fat from the  back of a pig (fatback). Bratwurst. Salted nuts and seeds. Canned beans with added salt. Canned or smoked fish. Whole eggs or egg yolks. Chicken or turkey with skin. Dairy Whole or 2% milk, cream, and half-and-half. Whole or full-fat cream cheese. Whole-fat or sweetened yogurt. Full-fat cheese. Nondairy creamers. Whipped toppings. Processed cheese and cheese spreads. Fats and oils Butter. Stick margarine. Lard. Shortening. Ghee. Bacon fat. Tropical oils, such as coconut, palm kernel, or palm oil. Seasonings and condiments Onion salt, garlic salt, seasoned salt, table salt, and sea salt. Worcestershire sauce. Tartar sauce. Barbecue sauce. Teriyaki sauce. Soy sauce, including reduced-sodium soy sauce. Steak sauce. Canned and packaged gravies. Fish sauce. Oyster sauce. Cocktail sauce. Store-bought horseradish. Ketchup. Mustard. Meat flavorings and tenderizers. Bouillon cubes. Hot sauces. Pre-made or packaged marinades. Pre-made or packaged taco seasonings. Relishes. Regular salad dressings. Other foods Salted popcorn and pretzels. The items listed above may not be all the foods and drinks you should avoid. Talk to a dietitian to learn more. Where to find more information National Heart, Lung, and Blood Institute (NHLBI): buffalodrycleaner.gl American Heart Association (AHA): heart.org Academy of Nutrition and Dietetics: eatright.org National Kidney Foundation (NKF): kidney.org This information is not intended to replace advice given to you by  your health care provider. Make sure you discuss any questions you have with your health care provider. Document Revised: 03/12/2024 Document Reviewed: 05/23/2022 Elsevier Patient Education  2025 [employer].

## 2024-06-23 ENCOUNTER — Telehealth: Admitting: Nurse Practitioner

## 2024-06-23 ENCOUNTER — Encounter: Payer: Self-pay | Admitting: Nurse Practitioner

## 2024-06-23 VITALS — BP 142/87 | HR 122 | Temp 95.5°F | Ht 63.5 in | Wt 320.0 lb

## 2024-06-23 DIAGNOSIS — I10 Essential (primary) hypertension: Secondary | ICD-10-CM

## 2024-06-23 DIAGNOSIS — R000 Tachycardia, unspecified: Secondary | ICD-10-CM

## 2024-06-23 DIAGNOSIS — E782 Mixed hyperlipidemia: Secondary | ICD-10-CM

## 2024-06-23 DIAGNOSIS — L03317 Cellulitis of buttock: Secondary | ICD-10-CM

## 2024-06-23 DIAGNOSIS — F339 Major depressive disorder, recurrent, unspecified: Secondary | ICD-10-CM

## 2024-06-23 DIAGNOSIS — F3340 Major depressive disorder, recurrent, in remission, unspecified: Secondary | ICD-10-CM

## 2024-06-23 NOTE — Assessment & Plan Note (Signed)
 Chronic, stable.  Denies SI/HI.  Continue current medication regimen and adjust as needed. Scores remain stable.

## 2024-06-23 NOTE — Assessment & Plan Note (Signed)
 Acute and she reports has now improved.

## 2024-06-23 NOTE — Assessment & Plan Note (Signed)
 Chronic, ongoing.  Did not tolerate Rosuvastatin , continue Atorvastatin .  Have attempted to find homebound provider without success at this time. Has referral in place and we continue to work on homebound or palliative care for her since she is unable to leave home. Unable to come in for labs.

## 2024-06-23 NOTE — Addendum Note (Signed)
 Addended by: Kaitlynne Wenz T on: 06/23/2024 05:00 PM   Modules accepted: Orders

## 2024-06-23 NOTE — Assessment & Plan Note (Signed)
 Ongoing, some improvement with Bystolic  increase. Denies any red flag symptoms. Will continue Bystolic  30 MG daily, educated her on this change, but recommend she return to 20 MG dosing if HR consistently trends to <60.  Continue Avapro  at current dose. Order for Zio to further assess placed last visit, will check on this as she has not received. Have attempted via multiple resources to attain homebound care for her as she is unable to leave home without an ambulance due to mobility and obesity. Continue to work on this. - Is aware that if any abnormal findings on Zio she will need to see cardiology, she reports she will need ambulance for this.

## 2024-06-23 NOTE — Assessment & Plan Note (Signed)
 Chronic, stable.  BP at goal often on her home checks. Collaboration with cardiology in past -- recommend she return for follow-up with them but she would need ambulance for transportation there.  Recommend she monitor BP at least a few mornings a week at home and document.  DASH diet at home.  Labs: unable to obtain. Have attempted to find homebound provider without success at this time. Has referral in place and we continue to work on homebound or palliative care for her since she is unable to leave home.

## 2024-06-23 NOTE — Progress Notes (Signed)
 "  BP (!) 142/87 (BP Location: Right Arm, Patient Position: Sitting, Cuff Size: Large)   Pulse (!) 122   Temp (!) 95.5 F (35.3 C) (Oral)   Ht 5' 3.5 (1.613 m)   Wt (!) 320 lb (145.2 kg)   LMP  (LMP Unknown)   BMI 55.79 kg/m    Subjective:    Patient ID: [name], female    DOB: 1941/04/29, 84 y.o.   MRN: [id number]  HPI: [name] is a 84 y.o. female  Chief Complaint  Patient presents with   HTN/HLD    Since increasing her nebivolol  (BYSTOLIC ), currently on 10 mg does feels it is better controlled.    Virtual Visit via Video Note  I connected with [name] on 06/23/24 at  3:00 PM EST by a video enabled telemedicine application and verified that I am speaking with the correct person using two identifiers.  Location: Patient: home Provider: work   I discussed the limitations of evaluation and management by telemedicine and the availability of in person appointments. The patient expressed understanding and agreed to proceed.  I discussed the assessment and treatment plan with the patient. The patient was provided an opportunity to ask questions and all were answered. The patient agreed with the plan and demonstrated an understanding of the instructions.   The patient was advised to call back or seek an in-person evaluation if the symptoms worsen or if the condition fails to improve as anticipated.  I provided 25 minutes of non-face-to-face time during this encounter.   Annie Roseboom T Boone Gear, NP   Cellulitis to buttocks has improved per her report.  HYPERTENSION / HYPERLIPIDEMIA At last visit increased Bystolic  due to tachycardia, this has helped some at lowering HR, but then it will come back up. Satisfied with current treatment? yes Duration of hypertension: chronic BP monitoring frequency: rarely BP range:  BP medication side effects: no Duration of hyperlipidemia: chronic Aspirin: no Recent stressors: no Recurrent headaches: no Visual changes:  no Palpitations: no Dyspnea: occasional with movement Chest pain: no Lower extremity edema: no Dizzy/lightheaded: no      06/23/2024    2:55 PM 06/04/2024    4:23 PM 03/16/2024    9:58 AM 12/23/2023   10:36 AM 07/31/2023   10:59 AM  Depression screen PHQ 2/9  Decreased Interest 0 0 0 0 0  Down, Depressed, Hopeless 0 0 0 0 0  PHQ - 2 Score 0 0 0 0 0  Altered sleeping 0 0 0 0 0  Tired, decreased energy 0 1 0 1 1  Change in appetite 0 0 0 0 0  Feeling bad or failure about yourself  0 0 0 0 0  Trouble concentrating 0 0 0 0 0  Moving slowly or fidgety/restless 0 0 0 0 0  Suicidal thoughts 0 0 0 0 0  PHQ-9 Score 0 1 0  1  1   Difficult doing work/chores  Not difficult at all  Not difficult at all Not difficult at all     Data saved with a previous flowsheet row definition       06/23/2024    2:55 PM 06/04/2024    4:25 PM 03/16/2024    9:58 AM 12/23/2023   10:37 AM  GAD 7 : Generalized Anxiety Score  Nervous, Anxious, on Edge 0 0  0  0   Control/stop worrying 0 1  0  0   Worry too much - different things 0 1  0  0   Trouble relaxing 0 1  0  0   Restless 0 0  0  0   Easily annoyed or irritable 0 0  0  0   Afraid - awful might happen 0 0  0  0   Total GAD 7 Score 0 3 0 0  Anxiety Difficulty  Not difficult at all  Not difficult at all     Data saved with a previous flowsheet row definition      Relevant past medical, surgical, family and social history reviewed and updated as indicated. Interim medical history since our last visit reviewed. Allergies and medications reviewed and updated.  Review of Systems  Constitutional:  Negative for activity change, appetite change, diaphoresis, fatigue and fever.  Respiratory:  Negative for cough, chest tightness, shortness of breath and wheezing.   Cardiovascular:  Negative for chest pain, palpitations and leg swelling.  Gastrointestinal: Negative.   Neurological: Negative.   Psychiatric/Behavioral: Negative.      Per HPI unless  specifically indicated above     Objective:    BP (!) 142/87 (BP Location: Right Arm, Patient Position: Sitting, Cuff Size: Large)   Pulse (!) 122   Temp (!) 95.5 F (35.3 C) (Oral)   Ht 5' 3.5 (1.613 m)   Wt (!) 320 lb (145.2 kg)   LMP  (LMP Unknown)   BMI 55.79 kg/m   Wt Readings from Last 3 Encounters:  06/23/24 (!) 320 lb (145.2 kg)  06/04/24 (!) 320 lb (145.2 kg)  07/31/23 (!) 320 lb (145.2 kg)    Physical Exam Vitals and nursing note reviewed.  Constitutional:      General: She is awake. She is not in acute distress.    Appearance: She is well-developed. She is not ill-appearing.  HENT:     Head: Normocephalic.     Right Ear: Hearing normal.     Left Ear: Hearing normal.  Eyes:     General: Lids are normal.        Right eye: No discharge.        Left eye: No discharge.     Conjunctiva/sclera: Conjunctivae normal.  Cardiovascular:     Rate and Rhythm: Tachycardia present.  Pulmonary:     Effort: Pulmonary effort is normal. No accessory muscle usage or respiratory distress.  Musculoskeletal:     Cervical back: Normal range of motion.  Neurological:     Mental Status: She is alert and oriented to person, place, and time.  Psychiatric:        Attention and Perception: Attention normal.        Mood and Affect: Mood normal.        Behavior: Behavior normal. Behavior is cooperative.        Thought Content: Thought content normal.        Judgment: Judgment normal.    Results for orders placed or performed in visit on 08/21/22  T4, free   Collection Time: 08/21/22 11:14 AM  Result Value Ref Range   Free T4 1.49 0.82 - 1.77 ng/dL  Thyroid  peroxidase antibody   Collection Time: 08/21/22 11:14 AM  Result Value Ref Range   Thyroperoxidase Ab SerPl-aCnc <9 0 - 34 IU/mL  TSH   Collection Time: 08/21/22 11:14 AM  Result Value Ref Range   TSH 3.360 0.450 - 4.500 uIU/mL  CBC with Differential/Platelet   Collection Time: 08/21/22 11:14 AM  Result Value Ref Range    WBC 4.7 3.4 - 10.8 x10E3/uL  RBC 4.71 3.77 - 5.28 x10E6/uL   Hemoglobin 14.3 11.1 - 15.9 g/dL   Hematocrit 56.5 65.9 - 46.6 %   MCV 92 79 - 97 fL   MCH 30.4 26.6 - 33.0 pg   MCHC 32.9 31.5 - 35.7 g/dL   RDW 86.5 88.2 - 84.5 %   Platelets 234 150 - 450 x10E3/uL   Neutrophils 56 Not Estab. %   Lymphs 35 Not Estab. %   Monocytes 9 Not Estab. %   Eos 0 Not Estab. %   Basos 0 Not Estab. %   Neutrophils Absolute 2.6 1.4 - 7.0 x10E3/uL   Lymphocytes Absolute 1.7 0.7 - 3.1 x10E3/uL   Monocytes Absolute 0.4 0.1 - 0.9 x10E3/uL   EOS (ABSOLUTE) 0.0 0.0 - 0.4 x10E3/uL   Basophils Absolute 0.0 0.0 - 0.2 x10E3/uL   Immature Granulocytes 0 Not Estab. %   Immature Grans (Abs) 0.0 0.0 - 0.1 x10E3/uL  Comprehensive metabolic panel   Collection Time: 08/21/22 11:14 AM  Result Value Ref Range   Glucose 90 70 - 99 mg/dL   BUN 19 8 - 27 mg/dL   Creatinine, Ser 9.05 0.57 - 1.00 mg/dL   eGFR 61 >40 fO/fpw/8.26   BUN/Creatinine Ratio 20 12 - 28   Sodium 139 134 - 144 mmol/L   Potassium 4.7 3.5 - 5.2 mmol/L   Chloride 103 96 - 106 mmol/L   CO2 19 (L) 20 - 29 mmol/L   Calcium  10.2 8.7 - 10.3 mg/dL   Total Protein 6.3 6.0 - 8.5 g/dL   Albumin 4.0 3.7 - 4.7 g/dL   Globulin, Total 2.3 1.5 - 4.5 g/dL   Albumin/Globulin Ratio 1.7 1.2 - 2.2   Bilirubin Total 0.6 0.0 - 1.2 mg/dL   Alkaline Phosphatase 86 44 - 121 IU/L   AST 15 0 - 40 IU/L   ALT 13 0 - 32 IU/L  Lipid Panel w/o Chol/HDL Ratio   Collection Time: 08/21/22 11:14 AM  Result Value Ref Range   Cholesterol, Total 222 (H) 100 - 199 mg/dL   Triglycerides 872 0 - 149 mg/dL   HDL 79 >60 mg/dL   VLDL Cholesterol Cal 22 5 - 40 mg/dL   LDL Chol Calc (NIH) 878 (H) 0 - 99 mg/dL  Magnesium   Collection Time: 08/21/22 11:14 AM  Result Value Ref Range   Magnesium 1.8 1.6 - 2.3 mg/dL  VITAMIN D  25 Hydroxy (Vit-D Deficiency, Fractures)   Collection Time: 08/21/22 11:14 AM  Result Value Ref Range   Vit D, 25-Hydroxy 68.6 30.0 - 100.0 ng/mL  Vitamin  B12   Collection Time: 08/21/22 11:14 AM  Result Value Ref Range   Vitamin B-12 574 232 - 1,245 pg/mL  C-reactive protein   Collection Time: 08/21/22 11:14 AM  Result Value Ref Range   CRP <1 0 - 10 mg/L  Sed Rate (ESR)   Collection Time: 08/21/22 11:14 AM  Result Value Ref Range   Sed Rate 19 0 - 40 mm/hr  Alpha-1-antitrypsin   Collection Time: 08/21/22 11:14 AM  Result Value Ref Range   A-1 Antitrypsin 167 101 - 187 mg/dL      Assessment & Plan:   Problem List Items Addressed This Visit       Cardiovascular and Mediastinum   Essential hypertension, benign   Chronic, stable.  BP at goal often on her home checks. Collaboration with cardiology in past -- recommend she return for follow-up with them but she would need ambulance for transportation there.  Recommend she monitor BP at least a few mornings a week at home and document.  DASH diet at home.  Labs: unable to obtain. Have attempted to find homebound provider without success at this time. Has referral in place and we continue to work on homebound or palliative care for her since she is unable to leave home.         Other   Tachycardia   Ongoing, some improvement with Bystolic  increase. Denies any red flag symptoms. Will continue Bystolic  30 MG daily, educated her on this change, but recommend she return to 20 MG dosing if HR consistently trends to <60.  Continue Avapro  at current dose. Order for Zio to further assess placed last visit, will check on this as she has not received. Have attempted via multiple resources to attain homebound care for her as she is unable to leave home without an ambulance due to mobility and obesity. Continue to work on this. - Is aware that if any abnormal findings on Zio she will need to see cardiology, she reports she will need ambulance for this.      Hyperlipidemia   Chronic, ongoing.  Did not tolerate Rosuvastatin , continue Atorvastatin .  Have attempted to find homebound provider without  success at this time. Has referral in place and we continue to work on homebound or palliative care for her since she is unable to leave home. Unable to come in for labs.      Depression - Primary   Chronic, stable.  Denies SI/HI.  Continue current medication regimen and adjust as needed. Scores remain stable.      Cellulitis   Acute and she reports has now improved.        Follow up plan: Return in about 3 months (around 09/20/2024) for HTN/HLD.      "

## 2024-06-24 ENCOUNTER — Ambulatory Visit

## 2024-06-24 DIAGNOSIS — R000 Tachycardia, unspecified: Secondary | ICD-10-CM

## 2024-06-24 NOTE — Progress Notes (Signed)
 Scheduled

## 2024-06-25 ENCOUNTER — Ambulatory Visit: Admitting: Nurse Practitioner

## 2024-07-13 ENCOUNTER — Ambulatory Visit: Admitting: Nurse Practitioner

## 2024-08-12 ENCOUNTER — Ambulatory Visit

## 2024-09-22 ENCOUNTER — Ambulatory Visit: Admitting: Nurse Practitioner
